# Patient Record
Sex: Male | Born: 1955
Health system: Southern US, Community
[De-identification: ages and names within clinical notes are randomized; demographics above are authoritative.]

## PROBLEM LIST (undated history)

## (undated) DIAGNOSIS — I10 Essential (primary) hypertension: Secondary | ICD-10-CM

## (undated) DIAGNOSIS — T8859XA Other complications of anesthesia, initial encounter: Secondary | ICD-10-CM

## (undated) DIAGNOSIS — I4510 Unspecified right bundle-branch block: Secondary | ICD-10-CM

## (undated) DIAGNOSIS — L719 Rosacea, unspecified: Secondary | ICD-10-CM

## (undated) DIAGNOSIS — T7840XA Allergy, unspecified, initial encounter: Secondary | ICD-10-CM

## (undated) DIAGNOSIS — E669 Obesity, unspecified: Secondary | ICD-10-CM

## (undated) DIAGNOSIS — G4730 Sleep apnea, unspecified: Secondary | ICD-10-CM

## (undated) DIAGNOSIS — R0602 Shortness of breath: Secondary | ICD-10-CM

## (undated) DIAGNOSIS — Z9289 Personal history of other medical treatment: Secondary | ICD-10-CM

## (undated) DIAGNOSIS — M1990 Unspecified osteoarthritis, unspecified site: Secondary | ICD-10-CM

## (undated) DIAGNOSIS — F17200 Nicotine dependence, unspecified, uncomplicated: Secondary | ICD-10-CM

## (undated) DIAGNOSIS — I480 Paroxysmal atrial fibrillation: Secondary | ICD-10-CM

## (undated) DIAGNOSIS — E785 Hyperlipidemia, unspecified: Secondary | ICD-10-CM

## (undated) DIAGNOSIS — Z86010 Personal history of colonic polyps: Secondary | ICD-10-CM

## (undated) DIAGNOSIS — K219 Gastro-esophageal reflux disease without esophagitis: Secondary | ICD-10-CM

## (undated) DIAGNOSIS — R011 Cardiac murmur, unspecified: Secondary | ICD-10-CM

## (undated) DIAGNOSIS — Z860101 Personal history of adenomatous and serrated colon polyps: Secondary | ICD-10-CM

## (undated) HISTORY — DX: Unspecified right bundle-branch block: I45.10

## (undated) HISTORY — PX: FINGER SURGERY: SHX640

## (undated) HISTORY — DX: Hyperlipidemia, unspecified: E78.5

## (undated) HISTORY — DX: Gastro-esophageal reflux disease without esophagitis: K21.9

## (undated) HISTORY — PX: TONSILLECTOMY: SUR1361

## (undated) HISTORY — PX: KNEE ARTHROSCOPY: SUR90

## (undated) HISTORY — PX: SHOULDER CLOSED REDUCTION: SHX1051

## (undated) HISTORY — DX: Rosacea, unspecified: L71.9

## (undated) HISTORY — DX: Personal history of other medical treatment: Z92.89

## (undated) HISTORY — DX: Paroxysmal atrial fibrillation: I48.0

## (undated) HISTORY — DX: Nicotine dependence, unspecified, uncomplicated: F17.200

## (undated) HISTORY — DX: Allergy, unspecified, initial encounter: T78.40XA

## (undated) HISTORY — DX: Cardiac murmur, unspecified: R01.1

---

## 1998-12-03 ENCOUNTER — Emergency Department (HOSPITAL_COMMUNITY): Admission: EM | Admit: 1998-12-03 | Discharge: 1998-12-04 | Payer: Self-pay | Admitting: Emergency Medicine

## 1998-12-04 ENCOUNTER — Encounter: Payer: Self-pay | Admitting: Emergency Medicine

## 2001-01-18 ENCOUNTER — Emergency Department (HOSPITAL_COMMUNITY): Admission: EM | Admit: 2001-01-18 | Discharge: 2001-01-18 | Payer: Self-pay | Admitting: Emergency Medicine

## 2001-01-18 ENCOUNTER — Encounter: Payer: Self-pay | Admitting: Emergency Medicine

## 2004-05-09 ENCOUNTER — Emergency Department (HOSPITAL_COMMUNITY): Admission: EM | Admit: 2004-05-09 | Discharge: 2004-05-09 | Payer: Self-pay | Admitting: Emergency Medicine

## 2006-07-29 ENCOUNTER — Ambulatory Visit: Payer: Self-pay | Admitting: Family Medicine

## 2006-08-07 ENCOUNTER — Encounter: Payer: Self-pay | Admitting: Cardiology

## 2006-08-07 ENCOUNTER — Ambulatory Visit: Payer: Self-pay

## 2006-10-10 ENCOUNTER — Ambulatory Visit: Payer: Self-pay | Admitting: Family Medicine

## 2006-11-22 ENCOUNTER — Ambulatory Visit: Payer: Self-pay | Admitting: Family Medicine

## 2006-11-25 ENCOUNTER — Encounter: Payer: Self-pay | Admitting: Vascular Surgery

## 2006-11-25 ENCOUNTER — Ambulatory Visit (HOSPITAL_COMMUNITY): Admission: RE | Admit: 2006-11-25 | Discharge: 2006-11-25 | Payer: Self-pay | Admitting: Family Medicine

## 2006-11-25 ENCOUNTER — Ambulatory Visit: Payer: Self-pay | Admitting: Vascular Surgery

## 2007-07-03 ENCOUNTER — Ambulatory Visit: Payer: Self-pay | Admitting: Family Medicine

## 2007-07-21 ENCOUNTER — Ambulatory Visit: Payer: Self-pay | Admitting: Family Medicine

## 2007-08-28 DIAGNOSIS — Z9289 Personal history of other medical treatment: Secondary | ICD-10-CM

## 2007-08-28 HISTORY — DX: Personal history of other medical treatment: Z92.89

## 2008-04-29 ENCOUNTER — Ambulatory Visit: Payer: Self-pay | Admitting: Family Medicine

## 2008-12-20 ENCOUNTER — Ambulatory Visit: Payer: Self-pay | Admitting: Family Medicine

## 2009-03-15 ENCOUNTER — Ambulatory Visit: Payer: Self-pay | Admitting: Family Medicine

## 2009-03-17 ENCOUNTER — Ambulatory Visit: Payer: Self-pay | Admitting: Family Medicine

## 2009-03-25 ENCOUNTER — Ambulatory Visit: Payer: Self-pay | Admitting: Family Medicine

## 2009-03-29 DIAGNOSIS — Z9289 Personal history of other medical treatment: Secondary | ICD-10-CM

## 2009-03-29 HISTORY — DX: Personal history of other medical treatment: Z92.89

## 2009-04-14 ENCOUNTER — Ambulatory Visit: Payer: Self-pay | Admitting: Family Medicine

## 2009-05-18 ENCOUNTER — Ambulatory Visit: Payer: Self-pay | Admitting: Family Medicine

## 2009-08-09 ENCOUNTER — Encounter: Admission: RE | Admit: 2009-08-09 | Discharge: 2009-08-09 | Payer: Self-pay | Admitting: Family Medicine

## 2009-08-09 ENCOUNTER — Ambulatory Visit: Payer: Self-pay | Admitting: Family Medicine

## 2010-04-18 ENCOUNTER — Ambulatory Visit: Payer: Self-pay | Admitting: Family Medicine

## 2010-09-11 ENCOUNTER — Encounter: Payer: Self-pay | Admitting: Orthopedic Surgery

## 2011-01-05 NOTE — Op Note (Signed)
David [name], CHAVIRA NO.:  [id number]   MEDICAL RECORD NO.:  [id number]          PATIENT TYPE:  EMS   LOCATION:  [facility]                         FACILITY:  [facility]   PHYSICIAN:  Cindee Salt, M.D.       DATE OF BIRTH:  [birth year]   DATE OF PROCEDURE:  05/09/2004  DATE OF DISCHARGE:                                 OPERATIVE REPORT   PREOPERATIVE DIAGNOSIS:  Laceration, extensor tendon, left index finger.   POSTOPERATIVE DIAGNOSIS:  Laceration, extensor tendon, left index finger.   OPERATION PERFORMED:  Repair of extensor tendon, left index finger.   SURGEON:  Cindee Salt, M.D.   ASSISTANTCarolyne Fiscal.   ANESTHESIA:  Local.   INDICATIONS FOR PROCEDURE:  The patient is a 55 year old male who suffered  an injury to his left index finger with a chain saw today.  He was seen in  [facility] Emergency Department.   DESCRIPTION OF PROCEDURE:  The patient was given a local anesthetic with 1%  Xylocaine without epinephrine.  He was prepped using DuraPrep solution.  A  tourniquet placed high on the arm was inflated to 250 mmHg.  The wound was  opened.  The laceration to the extensor tendon was identified.  The injury  did not go into the bone or into the joint.  Foreign material was removed.  The wound was then copiously irrigated with saline.  The tendon was then  repaired with figure-of-eight 4-0 Mersilene sutures.  The skin was repaired  with interrupted 5-0 nylon sutures after debridement of the damaged  margins.  A sterile compressive dressing and splint to the index and middle  finger were applied.  The patient tolerated the procedure well.  He is  discharged to home to return to the [facility] in one week on  Talwin NX and Keflex.       GK/MEDQ  D:  05/09/2004  T:  05/10/2004  Job:  [id number]

## 2011-03-05 ENCOUNTER — Other Ambulatory Visit: Payer: Self-pay

## 2011-03-05 MED ORDER — VALSARTAN 80 MG PO TABS: 80.0000 mg | ORAL_TABLET | Freq: Every day | ORAL | Status: DC

## 2011-03-14 ENCOUNTER — Encounter: Payer: Self-pay | Admitting: Family Medicine

## 2011-07-06 ENCOUNTER — Emergency Department (HOSPITAL_COMMUNITY): Payer: Worker's Compensation

## 2011-07-06 ENCOUNTER — Ambulatory Visit (HOSPITAL_COMMUNITY)
Admission: EM | Admit: 2011-07-06 | Discharge: 2011-07-06 | Disposition: A | Discharge status: Home / Self Care | Payer: Worker's Compensation | Attending: Emergency Medicine | Admitting: Emergency Medicine

## 2011-07-06 ENCOUNTER — Encounter (HOSPITAL_COMMUNITY): Payer: Self-pay | Admitting: Certified Registered Nurse Anesthetist

## 2011-07-06 ENCOUNTER — Encounter (HOSPITAL_COMMUNITY): Payer: Self-pay | Admitting: Anesthesiology

## 2011-07-06 ENCOUNTER — Emergency Department (HOSPITAL_COMMUNITY): Payer: Worker's Compensation | Admitting: Anesthesiology

## 2011-07-06 ENCOUNTER — Encounter (HOSPITAL_COMMUNITY): Payer: Self-pay

## 2011-07-06 ENCOUNTER — Other Ambulatory Visit: Payer: Self-pay

## 2011-07-06 ENCOUNTER — Encounter (HOSPITAL_COMMUNITY)
Admission: EM | Disposition: A | Discharge status: Home / Self Care | Payer: Self-pay | Source: Home / Self Care | Attending: Emergency Medicine

## 2011-07-06 DIAGNOSIS — Y990 Civilian activity done for income or pay: Secondary | ICD-10-CM | POA: Insufficient documentation

## 2011-07-06 DIAGNOSIS — Z23 Encounter for immunization: Secondary | ICD-10-CM | POA: Insufficient documentation

## 2011-07-06 DIAGNOSIS — S43006A Unspecified dislocation of unspecified shoulder joint, initial encounter: Secondary | ICD-10-CM

## 2011-07-06 DIAGNOSIS — W1789XA Other fall from one level to another, initial encounter: Secondary | ICD-10-CM | POA: Insufficient documentation

## 2011-07-06 DIAGNOSIS — Y929 Unspecified place or not applicable: Secondary | ICD-10-CM | POA: Insufficient documentation

## 2011-07-06 DIAGNOSIS — IMO0002 Reserved for concepts with insufficient information to code with codable children: Secondary | ICD-10-CM | POA: Insufficient documentation

## 2011-07-06 LAB — BASIC METABOLIC PANEL
BUN: 19 mg/dL (ref 6–23)
Calcium: 9.5 mg/dL (ref 8.4–10.5)
Creatinine, Ser: 0.85 mg/dL (ref 0.50–1.35)
GFR calc Af Amer: >90 mL/min (ref >90)
GFR calc non Af Amer: >90 mL/min (ref >90)
Glucose, Bld: 101 mg/dL — ABNORMAL HIGH (ref 70–99)
Potassium: 3.9 mEq/L (ref 3.5–5.1)

## 2011-07-06 LAB — DIFFERENTIAL
Basophils Absolute: 0.1 10*3/uL (ref 0.0–0.1)
Basophils Relative: 1 % (ref 0–1)
Eosinophils Absolute: 0.3 10*3/uL (ref 0.0–0.7)
Eosinophils Relative: 3 % (ref 0–5)
Lymphocytes Relative: 26 % (ref 12–46)
Lymphs Abs: 2.7 10*3/uL (ref 0.7–4.0)
Monocytes Absolute: 0.8 10*3/uL (ref 0.1–1.0)
Monocytes Relative: 8 % (ref 3–12)
Neutro Abs: 6.5 10*3/uL (ref 1.7–7.7)
Neutrophils Relative %: 63 % (ref 43–77)

## 2011-07-06 LAB — CBC
HCT: 45.5 % (ref 39.0–52.0)
Hemoglobin: 16.1 g/dL (ref 13.0–17.0)
MCH: 31.9 pg (ref 26.0–34.0)
MCHC: 35.4 g/dL (ref 30.0–36.0)
MCV: 90.1 fL (ref 78.0–100.0)
Platelets: 239 10*3/uL (ref 150–400)
RBC: 5.05 MIL/uL (ref 4.22–5.81)
RDW: 13.5 % (ref 11.5–15.5)
WBC: 10.2 10*3/uL (ref 4.0–10.5)

## 2011-07-06 SURGERY — CLOSED REDUCTION, SHOULDER
Anesthesia: General | Site: Shoulder | Laterality: Right | Wound class: Clean

## 2011-07-06 MED ORDER — HYDROMORPHONE HCL PF 2 MG/ML IJ SOLN
2.0000 mg | Freq: Once | INTRAMUSCULAR | Status: AC
  Administered 2011-07-06: 2 mg via INTRAVENOUS

## 2011-07-06 MED ORDER — SODIUM CHLORIDE 0.9 % IV BOLUS (SEPSIS)
1000.0000 mL | Freq: Once | INTRAVENOUS | Status: AC
  Administered 2011-07-06: 1000 mL via INTRAVENOUS

## 2011-07-06 MED ORDER — HYDROMORPHONE HCL PF 2 MG/ML IJ SOLN
2.0000 mg | Freq: Once | INTRAMUSCULAR | Status: AC
  Administered 2011-07-06: 2 mg via INTRAVENOUS
  Filled 2011-07-06: qty 1

## 2011-07-06 MED ORDER — ETOMIDATE 2 MG/ML IV SOLN
15.0000 mg | Freq: Once | INTRAVENOUS | Status: AC
  Administered 2011-07-06: 15 mg via INTRAVENOUS

## 2011-07-06 MED ORDER — MIDAZOLAM HCL 5 MG/5ML IJ SOLN
INTRAMUSCULAR | Status: DC | PRN
  Administered 2011-07-06: 1 mg via INTRAVENOUS

## 2011-07-06 MED ORDER — ONDANSETRON HCL 4 MG/2ML IJ SOLN
INTRAMUSCULAR | Status: DC | PRN
  Administered 2011-07-06: 4 mg via INTRAVENOUS

## 2011-07-06 MED ORDER — HYDROMORPHONE HCL PF 2 MG/ML IJ SOLN
INTRAMUSCULAR | Status: AC
  Filled 2011-07-06: qty 1

## 2011-07-06 MED ORDER — MEPERIDINE HCL 25 MG/ML IJ SOLN: 6.2500 mg | INTRAMUSCULAR | Status: DC | PRN

## 2011-07-06 MED ORDER — EPHEDRINE SULFATE 50 MG/ML IJ SOLN
INTRAMUSCULAR | Status: DC | PRN
  Administered 2011-07-06: 15 mg via INTRAVENOUS
  Administered 2011-07-06: 10 mg via INTRAVENOUS
  Administered 2011-07-06: 25 mg via INTRAVENOUS

## 2011-07-06 MED ORDER — PROPOFOL 10 MG/ML IV EMUL
INTRAVENOUS | Status: DC | PRN
  Administered 2011-07-06: 200 mg via INTRAVENOUS

## 2011-07-06 MED ORDER — FENTANYL CITRATE 0.05 MG/ML IJ SOLN: 100.0000 ug | Freq: Once | INTRAMUSCULAR | Status: DC

## 2011-07-06 MED ORDER — ETOMIDATE 2 MG/ML IV SOLN
INTRAVENOUS | Status: AC
  Filled 2011-07-06: qty 10

## 2011-07-06 MED ORDER — FENTANYL CITRATE 0.05 MG/ML IJ SOLN
25.0000 ug | INTRAMUSCULAR | Status: DC | PRN
  Administered 2011-07-06 (×2): 50 ug via INTRAVENOUS

## 2011-07-06 MED ORDER — SODIUM CHLORIDE 0.9 % IV SOLN
INTRAVENOUS | Status: DC | PRN
  Administered 2011-07-06: 20:00:00 via INTRAVENOUS

## 2011-07-06 MED ORDER — HYDROMORPHONE HCL PF 2 MG/ML IJ SOLN
2.0000 mg | Freq: Once | INTRAMUSCULAR | Status: AC
  Administered 2011-07-06: 1 mg via INTRAVENOUS

## 2011-07-06 MED ORDER — SUCCINYLCHOLINE CHLORIDE 20 MG/ML IJ SOLN
INTRAMUSCULAR | Status: DC | PRN
  Administered 2011-07-06: 200 mg via INTRAVENOUS

## 2011-07-06 MED ORDER — ONDANSETRON HCL 4 MG/2ML IJ SOLN
4.0000 mg | Freq: Once | INTRAMUSCULAR | Status: AC
  Administered 2011-07-06: 4 mg via INTRAVENOUS
  Filled 2011-07-06: qty 2

## 2011-07-06 MED ORDER — PHENYLEPHRINE HCL 10 MG/ML IJ SOLN
INTRAMUSCULAR | Status: DC | PRN
  Administered 2011-07-06: 80 ug via INTRAVENOUS
  Administered 2011-07-06: 40 ug via INTRAVENOUS
  Administered 2011-07-06: 80 ug via INTRAVENOUS

## 2011-07-06 MED ORDER — PROMETHAZINE HCL 25 MG/ML IJ SOLN
6.2500 mg | INTRAMUSCULAR | Status: DC | PRN
  Filled 2011-07-06: qty 1

## 2011-07-06 MED ORDER — HYDROMORPHONE HCL 2 MG PO TABS: 2.0000 mg | ORAL_TABLET | ORAL | Status: AC | PRN

## 2011-07-06 MED ORDER — FENTANYL CITRATE 0.05 MG/ML IJ SOLN
INTRAMUSCULAR | Status: DC | PRN
  Administered 2011-07-06: 100 ug via INTRAVENOUS

## 2011-07-06 MED ORDER — SODIUM CHLORIDE 0.9 % IR SOLN
Status: DC | PRN
  Administered 2011-07-06: 1

## 2011-07-06 MED ORDER — FENTANYL CITRATE 0.05 MG/ML IJ SOLN
INTRAMUSCULAR | Status: AC
  Administered 2011-07-06: 100 ug
  Filled 2011-07-06: qty 2

## 2011-07-06 MED ORDER — TETANUS-DIPHTHERIA TOXOIDS TD 5-2 LFU IM INJ
0.5000 mL | INJECTION | Freq: Once | INTRAMUSCULAR | Status: AC
  Administered 2011-07-06: 0.5 mL via INTRAMUSCULAR
  Filled 2011-07-06: qty 0.5

## 2011-07-06 SURGICAL SUPPLY — 3 items
BANDAGE GAUZE ELAST BULKY 4 IN (GAUZE/BANDAGES/DRESSINGS) ×2 IMPLANT
SLING ARM FOAM STRAP XLG (SOFTGOODS) ×2 IMPLANT
SPONGE GAUZE 4X4 12PLY (GAUZE/BANDAGES/DRESSINGS) ×6 IMPLANT

## 2011-07-06 NOTE — ED Notes (Signed)
Patient transported to CT 

## 2011-07-06 NOTE — Anesthesia Postprocedure Evaluation (Signed)
  Anesthesia Post-op Note  Patient: David [name]  Procedure(s) Performed:  CLOSED REDUCTION SHOULDER  Patient Location: PACU  Anesthesia Type: General  Level of Consciousness: awake and alert   Airway and Oxygen Therapy: Patient Spontanous Breathing and Patient connected to nasal cannula oxygen  Post-op Pain: mild  Post-op Assessment: Post-op Vital signs reviewed, Patient's Cardiovascular Status Stable, Respiratory Function Stable, Patent Airway and No signs of Nausea or vomiting  Post-op Vital Signs: stable  Complications: No apparent anesthesia complications

## 2011-07-06 NOTE — ED Notes (Signed)
Patient transported to X-ray 

## 2011-07-06 NOTE — Anesthesia Preprocedure Evaluation (Signed)
Anesthesia Evaluation  Patient identified by MRN, date of birth, ID band Patient awake    Reviewed: Allergy & Precautions, H&P , NPO status   Airway Mallampati: III TM Distance: >3 FB Neck ROM: Full    Dental  (+) Teeth Intact   Pulmonary sleep apnea and Continuous Positive Airway Pressure Ventilation , Current Smoker,  clear to auscultation        Cardiovascular hypertension, Pt. on home beta blockers Regular + Systolic murmurs    Neuro/Psych Negative Neurological ROS  Negative Psych ROS   GI/Hepatic Neg liver ROS, GERD-  Controlled,  Endo/Other  Morbid obesity  Renal/GU negative Renal ROS     Musculoskeletal negative musculoskeletal ROS (+)   Abdominal (+) obese,   Peds  Hematology negative hematology ROS (+)   Anesthesia Other Findings   Reproductive/Obstetrics negative OB ROS                           Anesthesia Physical Anesthesia Plan  ASA: III and Emergent  Anesthesia Plan: General   Post-op Pain Management:    Induction: Intravenous  Airway Management Planned: Oral ETT  Additional Equipment:   Intra-op Plan:   Post-operative Plan: Extubation in OR  Informed Consent:   Dental advisory given  Plan Discussed with: Anesthesiologist  Anesthesia Plan Comments:         Anesthesia Quick Evaluation

## 2011-07-06 NOTE — ED Notes (Signed)
Patient taken to the OR.  Spoke with Dr. Andrey Spearman and no orders recd just get him to the OR.  CT tech assisted with taking the patient to the OR.  Belongings given to [place], friend.

## 2011-07-06 NOTE — Op Note (Signed)
David [name], GARCON NO.:  [id number]  MEDICAL RECORD NO.:  [id number]  LOCATION:  [facility]                         FACILITY:  [facility]  PHYSICIAN:  Estill Bamberg, MD      DATE OF BIRTH:  Mar 05, 1956  DATE OF PROCEDURE: DATE OF DISCHARGE:                              OPERATIVE REPORT   PREOPERATIVE DIAGNOSIS:  Right superior shoulder dislocation.  POSTOPERATIVE DIAGNOSIS:  Right superior shoulder dislocation.  PROCEDURE: 1. Closed reduction of right shoulder under general endotracheal     anesthesia. 2. Intraoperative use of fluoroscopy.  SURGEON:  Estill Bamberg, MD  ASSISTANT:  Nadara Mustard, MD  ANESTHESIA:  General endotracheal anesthesia.  COMPLICATIONS:  None.  DISPOSITION:  Stable.  INDICATIONS FOR PROCEDURE:  Briefly, Mr. Nafziger is a pleasant 55 year old male who presented to the emergency department today after falling from approximately 9-10 foot height from a ladder.  The patient fell directly on his right elbow and did go on to have severe pain in the right shoulder.  Radiographs in the emergency department were consistent with a superior dislocation of the right shoulder.  I did attempt a closed reduction under conscious sedation in the emergency department but this was unsuccessful.  I subsequently obtained a CAT scan of the patient's right shoulder, which did confirm a significant superior dislocation of the shoulder.  Of note, the humeral head was perched superior to the coracoid process.  Given the persistent dislocation, I did have a discussion with the patient regarding going forward with a closed reduction under general endotracheal anesthesia.  The patient clearly understood that this was a rather significant injury and that the brachial plexus was under significant tension and that reduction of his injury can result in neurovascular injury.  OPERATIVE DETAILS:  On July 06, 2011, the patient was brought to surgery and general  endotracheal anesthesia was administered.  A C-arm was brought in.  Initial AP fluoroscopic view did confirm persistent superior dislocation of his shoulder.  I did proceed with manipulating the patient's right shoulder with Aldean Baker serving as my Geophysicist/field seismologist. I did bring the shoulder through various ranges of motion involving abduction as well as internal and external rotation.  No audible or palpable clunk was perceived, however I did feel that the right shoulder was subsequently reduced.  I then proceeded with obtaining multiple fluoroscopic views including AP and axillary views and this did confirm appropriate and concentric reduction of the glenohumeral joint.  The patient did have a palpable radial pulse after the closed reduction.     Estill Bamberg, MD     MD/MEDQ  D:  07/06/2011  T:  07/06/2011  Job:  [id number]  cc:   Nadara Mustard, MD

## 2011-07-06 NOTE — Transfer of Care (Signed)
Immediate Anesthesia Transfer of Care Note  Patient: David [name]  Procedure(s) Performed:  CLOSED REDUCTION SHOULDER  Patient Location: PACU  Anesthesia Type: General  Level of Consciousness: awake, alert , oriented and patient cooperative  Airway & Oxygen Therapy: Patient Spontanous Breathing and Patient connected to nasal cannula oxygen  Post-op Assessment: Report given to PACU RN, Post -op Vital signs reviewed and stable and Patient moving all extremities X 4  Post vital signs: Reviewed and stable  Complications: No apparent anesthesia complications

## 2011-07-06 NOTE — ED Notes (Signed)
Pt arrived on back board and in head blocks with towel for c-collar as c-collar would not fit

## 2011-07-06 NOTE — ED Provider Notes (Addendum)
History     CSN: [id number] Arrival date & time: 07/06/2011  2:58 PM   First MD Initiated Contact with Patient 07/06/11 1507      Chief Complaint  Patient presents with  . Fall    (Consider location/radiation/quality/duration/timing/severity/associated sxs/prior treatment) HPI.... level V caveat for urgent need for intervention. Patient fell approximately 15 feet from a tree. No loss of consciousness, head trauma, neck trauma abdominal pain, back pain. Complains of right shoulder pain and abrasion to right elbow.  Movement makes pain worse  Past Medical History  Diagnosis Date  . Allergy     RHINITIS  . GERD (gastroesophageal reflux disease)   . PAF (paroxysmal atrial fibrillation)   . Hyperlipidemia   . Rosacea   . Smoker     No past surgical history on file.  Family History  Problem Relation Age of Onset  . Cancer Father     COLON  . Cancer Paternal Uncle     History  Substance Use Topics  . Smoking status: Not on file  . Smokeless tobacco: Not on file  . Alcohol Use:       Review of Systems  Unable to perform ROS: Other    Allergies  Codeine and Morphine and related  Home Medications   Current Outpatient Rx  Name Route Sig Dispense Refill  . ASPIRIN 81 MG PO TABS Oral Take 81 mg by mouth daily.      Marland Kitchen CETIRIZINE HCL 10 MG PO TABS Oral Take 10 mg by mouth daily.      Marland Kitchen METOPROLOL SUCCINATE 50 MG PO TB24 Oral Take 50 mg by mouth daily.      Marland Kitchen VALSARTAN 80 MG PO TABS Oral Take 160 mg by mouth daily.        BP 119/44  Pulse 78  Temp(Src) 98.2 F (36.8 C) (Oral)  Resp 13  Ht 6\' 4"  (1.93 m)  Wt 340 lb (154.223 kg)  BMI 41.39 kg/m2  SpO2 99%  Physical Exam  Nursing note and vitals reviewed. Constitutional: He is oriented to person, place, and time. He appears well-developed and well-nourished.       Obese  HENT:  Head: Normocephalic and atraumatic.  Eyes: Conjunctivae and EOM are normal. Pupils are equal, round, and reactive to light.  Neck:  Normal range of motion. Neck supple.       No neck tenderness  Cardiovascular: Normal rate and regular rhythm.   Pulmonary/Chest: Effort normal and breath sounds normal.  Abdominal: Soft. Bowel sounds are normal.  Musculoskeletal:       Generalized tenderness around right shoulder. Appears dislocated. Pain with range of motion. Abrasion on right elbow tip  Neurological: He is alert and oriented to person, place, and time.       Normal motor sensory and cerebellar exam  Skin: Skin is warm and dry.  Psychiatric: He has a normal mood and affect.    ED Course  Procedural sedation Date/Time: 07/06/2011 9:18 PM Performed by: Donnetta Hutching Authorized by: Donnetta Hutching Consent: Verbal consent obtained. Written consent obtained. Risks and benefits: risks, benefits and alternatives were discussed Consent given by: patient Patient understanding: patient states understanding of the procedure being performed Patient consent: the patient's understanding of the procedure matches consent given Procedure consent: procedure consent matches procedure scheduled Relevant documents: relevant documents present and verified Test results: test results available and properly labeled Site marked: the operative site was marked Imaging studies: imaging studies available Required items: required blood products, implants, devices,  and special equipment available Patient identity confirmed: verbally with patient Time out: Immediately prior to procedure a "time out" was called to verify the correct patient, procedure, equipment, support staff and site/side marked as required. Local anesthesia used: no Patient sedated: yes Sedatives: etomidate and fentanyl Analgesia: hydromorphone Vitals: Vital signs were monitored during sedation. Patient tolerance: Patient tolerated the procedure well with no immediate complications (Tolerated well). Comments: Total conscious sedation time of approximately 30 minutes   (including  critical care time)  Labs Reviewed  BASIC METABOLIC PANEL - Abnormal; Notable for the following:    Sodium 134 (*)    Glucose, Bld 101 (*)    All other components within normal limits  CBC  DIFFERENTIAL   No results found.   No diagnosis found.  Date: 07/06/2011  Rate: 74  Rhythm: normal sinus rhythm  QRS Axis: normal  Intervals: normal  ST/T Wave abnormalities: normal  Conduction Disutrbances:right bundle branch block  Narrative Interpretation:   Old EKG Reviewed: none available    MDM  Mostly concerned about right shoulder. will scan head and neck secondary to trauma. X-rays right shoulder shows superior subluxation. We'll consult orthopedics. Conscious sedation perform        Donnetta Hutching, MD 07/06/11 1651  Donnetta Hutching, MD 07/06/11 2120

## 2011-07-06 NOTE — ED Notes (Signed)
Pt was at work cutting branches and fell approx 15 feet to pavement.  ems report no loc. ems gave fentanyl

## 2011-07-06 NOTE — ED Notes (Signed)
Report given to Barbara RN

## 2011-07-06 NOTE — Anesthesia Procedure Notes (Addendum)
Procedure Name: Intubation Date/Time: 07/06/2011 8:48 PM Performed by: Shirlyn Goltz, TOM Pre-anesthesia Checklist: Patient identified, Emergency Drugs available, Suction available, Patient being monitored and Timeout performed Patient Re-evaluated:Patient Re-evaluated prior to inductionOxygen Delivery Method: Circle System Utilized Preoxygenation: Pre-oxygenation with 100% oxygen Intubation Type: IV induction Laryngoscope Size: Mac and 4 Grade View: Grade II Tube size: 7.5 mm Number of attempts: 1 Placement Confirmation: ETT inserted through vocal cords under direct vision,  positive ETCO2 and breath sounds checked- equal and bilateral Secured at: 23 cm Tube secured with: Tape Dental Injury: Teeth and Oropharynx as per pre-operative assessment

## 2011-07-06 NOTE — Op Note (Signed)
NAMEMARKISE, [name] NO.:  [id number]  MEDICAL RECORD NO.:  [id number]  LOCATION:  [facility]                        FACILITY:  [facility]  PHYSICIAN:  Estill Bamberg, MD      DATE OF BIRTH:  05-24-1956  DATE OF PROCEDURE:  07/06/2011 DATE OF DISCHARGE:                              OPERATIVE REPORT   SURGEON:  Estill Bamberg, MD  PRE-PROCEDURE DIAGNOSIS:  Right shoulder dislocation.  HISTORY OF PRESENT ILLNESS:  [name] is a pleasant male evaluated in the emergency department.  Radiographs were consistent with a shoulder dislocation, and the patient was given conscious sedation to go forward with closed reduction of the right shoulder.  PROCEDURE NOTE:  Conscious sedation was administered via the emergency room physician, Dr. Donnetta Hutching.  Under conscious sedation, I did use a traction/counter-traction method, and I did attempt to relocate the shoulder.  I did attempt this procedure for approximately 5 minutes in duration.  No audible or palpable clunk was heard, and I did not feel any significant reduction maneuver was ultimately performed.  PLAN:  Currently is to go forward with a CAT scan of the patient's right shoulder to precisely determine the location of the glenohumeral joint.     Estill Bamberg, MD     MD/MEDQ  D:  07/06/2011  T:  07/06/2011  Job:  [id number]

## 2011-07-06 NOTE — Preoperative (Signed)
Beta Blockers   Reason not to administer Beta Blockers:Not Applicable 

## 2011-07-06 NOTE — ED Notes (Signed)
Called to radiology department. Pt c/o of extreme pain, unable to complete tests. Dr. Adriana Simas aware. Medicated again per order in radiology department.

## 2011-07-07 NOTE — Consult Note (Signed)
NAMEJERIC, David [name] NO.:  [id number]  MEDICAL RECORD NO.:  [id number]  LOCATION:  [facility]                        FACILITY:  [facility]  PHYSICIAN:  Estill Bamberg, MD      DATE OF BIRTH:  April 11, 1956  DATE OF CONSULTATION: DATE OF DISCHARGE:                                CONSULTATION   REASON FOR CONSULT:  Right shoulder pain.  HISTORY OF PRESENT ILLNESS:  David [name] is a pleasant 55 year old male who states earlier this afternoon, he was up on a ladder and trimming a tree.  He states he fell directly on his right arm.  To his best knowledge, he feels that he fell directly on his left elbow.  It is the patient's approximation that his feet were approximately 9-10 feet from the ground.  When he awoke, he complained of severe pain in the elbow and in the shoulder.  Radiographs were consistent with a right shoulder dislocation and I was called to evaluate the patient.  The patient denies any significant pain in his other extremities other than the significant pain in the right shoulder.  The pain is better than it was when initially began.  The patient does rate the pain as being rather severe.  PAST MEDICAL HISTORY: 1. Rhinitis. 2. Gastroesophageal reflux disease. 3. Paroxysmal atrial fibrillation. 4. Hyperlipidemia. 5. Rosacea.  PAST SURGICAL HISTORY:  None.  MEDICATIONS:  Baby aspirin, cetirizine, metoprolol, and valsartan.  ALLERGIES:  CODEINE and MORPHINE.  SOCIAL HISTORY:  The patient is a smoker.  PHYSICAL EXAMINATION:  The patient is lying comfortably supine on the hospital bed.  He does have superficial abrasions about the right elbow. There is a prominence overlying the anterosuperior aspect of the right shoulder.  Any range of motion of the right shoulder does cause him very significant pain.  The patient is noted to be neurovascularly intact distally with full grip strength noted.  He has full sensation throughout all 5 fingers.  He has a positive  radial pulse on both the right and the left sides.  Pulses are symmetric.  Of note, the patient is a rather overweight and rather a large-appearing individual.  He does have a normal affect and normal mood. His abdomen is soft and nontender. He does appear to be breathing comfortably.  A scapular Y an AP view of the patient's right shoulder were reviewed. There does appear to be anterior/superior dislocation of the right shoulder.  I do believe that a superior dislocation is certainly a possibility here, but these are not perfect fuse and given the rarity of superior dislocations, I tend to favor that this is actually an anterior dislocation of the shoulder.  ASSESSMENT:  Anterior/possible superior dislocation of the right glenohumeral joint.  PLAN:  I did have a lengthy discussion with David [name] regarding these diagnoses.  He currently is noted to have a dislocation involving the right shoulder, which I do suspect as likely be an anterior shoulder dislocation.  I did have a discussion regarding treatment.  The plan currently is to go forward with giving the patient a full conscious sedation and to go forward with the reduction maneuver.  I did fully  explain to the patient that there is a risk of neurovascular injury as well as a fracture given the rarity of what may be a superior dislocation and given the patient's large size and body habitus.  The patient does understand that if this is not reduced and closed here in the emergency department, they will may have do this under general anesthesia in the operating room, but the first attempt will be to handle this here in the emergency department.  The plan currently is to go forward with a closed glenohumeral reduction.  Again, the risks and benefits were fully described to the patient and we will go ahead with the plan as noted above.     Estill Bamberg, MD     MD/MEDQ  D:  07/06/2011  T:  07/07/2011  Job:  [id number]

## 2011-07-11 ENCOUNTER — Encounter (HOSPITAL_COMMUNITY): Payer: Self-pay | Admitting: Orthopedic Surgery

## 2011-08-08 ENCOUNTER — Encounter (HOSPITAL_COMMUNITY): Payer: Self-pay | Admitting: Pharmacy Technician

## 2011-08-09 ENCOUNTER — Other Ambulatory Visit: Payer: Self-pay | Admitting: Orthopedic Surgery

## 2011-08-09 ENCOUNTER — Encounter (HOSPITAL_COMMUNITY): Payer: Self-pay

## 2011-08-09 ENCOUNTER — Encounter (HOSPITAL_COMMUNITY)
Admission: RE | Admit: 2011-08-09 | Discharge: 2011-08-09 | Disposition: A | Discharge status: Home / Self Care | Payer: Worker's Compensation | Source: Ambulatory Visit | Attending: Orthopedic Surgery | Admitting: Orthopedic Surgery

## 2011-08-09 ENCOUNTER — Other Ambulatory Visit: Payer: Self-pay

## 2011-08-09 HISTORY — DX: Essential (primary) hypertension: I10

## 2011-08-09 HISTORY — DX: Unspecified osteoarthritis, unspecified site: M19.90

## 2011-08-09 HISTORY — DX: Shortness of breath: R06.02

## 2011-08-09 HISTORY — DX: Sleep apnea, unspecified: G47.30

## 2011-08-09 LAB — CBC
HCT: 44.4 % (ref 39.0–52.0)
Hemoglobin: 15.6 g/dL (ref 13.0–17.0)
MCHC: 35.1 g/dL (ref 30.0–36.0)

## 2011-08-09 LAB — COMPREHENSIVE METABOLIC PANEL
Alkaline Phosphatase: 87 U/L (ref 39–117)
BUN: 19 mg/dL (ref 6–23)
GFR calc Af Amer: >90 mL/min (ref >90)
GFR calc non Af Amer: >90 mL/min (ref >90)
Glucose, Bld: 93 mg/dL (ref 70–99)
Potassium: 4 mEq/L (ref 3.5–5.1)
Total Bilirubin: 0.4 mg/dL (ref 0.3–1.2)
Total Protein: 7.3 g/dL (ref 6.0–8.3)

## 2011-08-09 LAB — SURGICAL PCR SCREEN: Staphylococcus aureus: NEGATIVE

## 2011-08-09 NOTE — Pre-Procedure Instructions (Signed)
21 David [name]  08/09/2011   Your procedure is scheduled on:  Saturday, December 22nd.  Report to [facility] to be registered.  Call this number if you have problems the morning of surgery: [phone]   Remember:   Do not eat food:After Midnight.  May have clear liquids: up to 4 Hours before arrival.  2:00AM  Clear liquids include soda, tea, black coffee, apple or grape juice, broth.  Take these medicines the morning of surgery with A SIP OF WATER: Metoprolol (Toprol-XL), Dilaudid and Zyrtec  Do not wear jewelry, make-up or nail polish.  Do not wear lotions, powders, or perfumes. You may wear deodorant.  Do not shave 48 hours prior to surgery.  Do not bring valuables to the hospital.  Contacts, dentures or bridgework may not be worn into surgery.  Leave suitcase in the car. After surgery it may be brought to your room.  For patients admitted to the hospital, checkout time is 11:00 AM the day of discharge.   Patients discharged the day of surgery will not be allowed to drive home.  Name and phone number of your driver: NA  Special Instructions: CHG Shower Use Special Wash: 1/2 bottle night before surgery and 1/2 bottle morning of surgery.   Please read over the following fact sheets that you were given: Pain Booklet, Coughing and Deep Breathing, MRSA Information and Surgical Site Infection Prevention

## 2011-08-10 MED ORDER — CEFAZOLIN SODIUM-DEXTROSE 2-3 GM-% IV SOLR
2.0000 g | INTRAVENOUS | Status: AC
  Administered 2011-08-11: 2 g via INTRAVENOUS
  Filled 2011-08-10: qty 50

## 2011-08-10 NOTE — Anesthesia Preprocedure Evaluation (Addendum)
Anesthesia Evaluation  Patient identified by MRN, date of birth, ID band Patient awake    Reviewed: Allergy & Precautions, H&P , NPO status , Patient's Chart, lab work & pertinent test results, reviewed documented beta blocker date and time   Airway Mallampati: III TM Distance: >3 FB Neck ROM: Full    Dental   Pulmonary  clear to auscultation        Cardiovascular hypertension, Regular Normal    Neuro/Psych    GI/Hepatic GERD-  ,  Endo/Other    Renal/GU      Musculoskeletal   Abdominal   Peds  Hematology   Anesthesia Other Findings   Reproductive/Obstetrics                         Anesthesia Physical Anesthesia Plan  ASA: III  Anesthesia Plan: General   Post-op Pain Management:    Induction: Intravenous  Airway Management Planned: Oral ETT and Video Laryngoscope Planned  Additional Equipment:   Intra-op Plan:   Post-operative Plan: Extubation in OR  Informed Consent: I have reviewed the patients History and Physical, chart, labs and discussed the procedure including the risks, benefits and alternatives for the proposed anesthesia with the patient or authorized representative who has indicated his/her understanding and acceptance.   Dental advisory given  Plan Discussed with: CRNA, Anesthesiologist and Surgeon  Anesthesia Plan Comments:         Anesthesia Quick Evaluation

## 2011-08-11 ENCOUNTER — Encounter (HOSPITAL_COMMUNITY): Payer: Self-pay | Admitting: Anesthesiology

## 2011-08-11 ENCOUNTER — Emergency Department (HOSPITAL_COMMUNITY)
Admission: EM | Admit: 2011-08-11 | Discharge: 2011-08-11 | Disposition: A | Discharge status: Home / Self Care | Payer: Worker's Compensation

## 2011-08-11 ENCOUNTER — Encounter (HOSPITAL_COMMUNITY): Payer: Self-pay | Admitting: *Deleted

## 2011-08-11 ENCOUNTER — Inpatient Hospital Stay (HOSPITAL_COMMUNITY)
Admission: RE | Admit: 2011-08-11 | Discharge: 2011-08-14 | DRG: 502 | Disposition: A | Discharge status: Home / Self Care | Payer: Worker's Compensation | Source: Ambulatory Visit | Attending: Orthopedic Surgery | Admitting: Orthopedic Surgery

## 2011-08-11 ENCOUNTER — Ambulatory Visit (HOSPITAL_COMMUNITY): Payer: Worker's Compensation | Admitting: Anesthesiology

## 2011-08-11 ENCOUNTER — Encounter (HOSPITAL_COMMUNITY)
Admission: RE | Disposition: A | Discharge status: Home / Self Care | Payer: Self-pay | Source: Ambulatory Visit | Attending: Orthopedic Surgery

## 2011-08-11 DIAGNOSIS — W19XXXA Unspecified fall, initial encounter: Secondary | ICD-10-CM | POA: Diagnosis present

## 2011-08-11 DIAGNOSIS — F17200 Nicotine dependence, unspecified, uncomplicated: Secondary | ICD-10-CM | POA: Diagnosis present

## 2011-08-11 DIAGNOSIS — M129 Arthropathy, unspecified: Secondary | ICD-10-CM | POA: Diagnosis present

## 2011-08-11 DIAGNOSIS — E785 Hyperlipidemia, unspecified: Secondary | ICD-10-CM | POA: Diagnosis present

## 2011-08-11 DIAGNOSIS — Y9269 Other specified industrial and construction area as the place of occurrence of the external cause: Secondary | ICD-10-CM

## 2011-08-11 DIAGNOSIS — Y990 Civilian activity done for income or pay: Secondary | ICD-10-CM

## 2011-08-11 DIAGNOSIS — I10 Essential (primary) hypertension: Secondary | ICD-10-CM | POA: Diagnosis present

## 2011-08-11 DIAGNOSIS — L719 Rosacea, unspecified: Secondary | ICD-10-CM | POA: Diagnosis present

## 2011-08-11 DIAGNOSIS — S43429A Sprain of unspecified rotator cuff capsule, initial encounter: Principal | ICD-10-CM | POA: Diagnosis present

## 2011-08-11 DIAGNOSIS — I4891 Unspecified atrial fibrillation: Secondary | ICD-10-CM | POA: Diagnosis present

## 2011-08-11 DIAGNOSIS — K219 Gastro-esophageal reflux disease without esophagitis: Secondary | ICD-10-CM | POA: Diagnosis present

## 2011-08-11 HISTORY — PX: SHOULDER OPEN ROTATOR CUFF REPAIR: SHX2407

## 2011-08-11 SURGERY — REPAIR, ROTATOR CUFF, OPEN
Anesthesia: General | Site: Shoulder | Laterality: Right | Wound class: Clean

## 2011-08-11 MED ORDER — DIPHENHYDRAMINE HCL 25 MG PO CAPS: 25.0000 mg | ORAL_CAPSULE | Freq: Four times a day (QID) | ORAL | Status: DC | PRN

## 2011-08-11 MED ORDER — BUPIVACAINE-EPINEPHRINE PF 0.5-1:200000 % IJ SOLN
INTRAMUSCULAR | Status: DC | PRN
  Administered 2011-08-11: 30 mL

## 2011-08-11 MED ORDER — ONDANSETRON HCL 4 MG/2ML IJ SOLN: 4.0000 mg | Freq: Once | INTRAMUSCULAR | Status: DC | PRN

## 2011-08-11 MED ORDER — ONDANSETRON HCL 4 MG PO TABS: 4.0000 mg | ORAL_TABLET | Freq: Four times a day (QID) | ORAL | Status: DC | PRN

## 2011-08-11 MED ORDER — ADULT MULTIVITAMIN W/MINERALS CH
1.0000 | ORAL_TABLET | Freq: Every day | ORAL | Status: DC
  Administered 2011-08-11 – 2011-08-14 (×4): 1 via ORAL
  Filled 2011-08-11 (×4): qty 1

## 2011-08-11 MED ORDER — VITAMIN C 500 MG PO TABS
1000.0000 mg | ORAL_TABLET | Freq: Every day | ORAL | Status: DC
  Administered 2011-08-11 – 2011-08-14 (×4): 1000 mg via ORAL
  Filled 2011-08-11 (×4): qty 2

## 2011-08-11 MED ORDER — PHENYLEPHRINE HCL 10 MG/ML IJ SOLN
10.0000 mg | INTRAVENOUS | Status: DC | PRN
  Administered 2011-08-11: 10 ug/min via INTRAVENOUS

## 2011-08-11 MED ORDER — HYDROMORPHONE HCL PF 1 MG/ML IJ SOLN
0.2500 mg | INTRAMUSCULAR | Status: DC | PRN
  Administered 2011-08-11 (×4): 0.5 mg via INTRAVENOUS

## 2011-08-11 MED ORDER — LACTATED RINGERS IV SOLN
INTRAVENOUS | Status: DC
  Administered 2011-08-11 – 2011-08-12 (×2): via INTRAVENOUS

## 2011-08-11 MED ORDER — NEOSTIGMINE METHYLSULFATE 1 MG/ML IJ SOLN
INTRAMUSCULAR | Status: DC | PRN
  Administered 2011-08-11: 5 mg via INTRAVENOUS

## 2011-08-11 MED ORDER — SENNA 8.6 MG PO TABS
1.0000 | ORAL_TABLET | Freq: Two times a day (BID) | ORAL | Status: DC
  Administered 2011-08-11 – 2011-08-14 (×6): 8.6 mg via ORAL
  Filled 2011-08-11 (×7): qty 1

## 2011-08-11 MED ORDER — HYDROMORPHONE HCL PF 1 MG/ML IJ SOLN
0.5000 mg | INTRAMUSCULAR | Status: DC | PRN
Start: 2011-08-11 — End: 2011-08-11
  Administered 2011-08-11 (×2): 0.5 mg via INTRAVENOUS

## 2011-08-11 MED ORDER — ROCURONIUM BROMIDE 100 MG/10ML IV SOLN
INTRAVENOUS | Status: DC | PRN
  Administered 2011-08-11: 50 mg via INTRAVENOUS

## 2011-08-11 MED ORDER — CEFAZOLIN SODIUM 1-5 GM-% IV SOLN
1.0000 g | Freq: Three times a day (TID) | INTRAVENOUS | Status: DC
  Administered 2011-08-11 – 2011-08-13 (×5): 1 g via INTRAVENOUS
  Filled 2011-08-11 (×7): qty 50

## 2011-08-11 MED ORDER — METHOCARBAMOL 100 MG/ML IJ SOLN
500.0000 mg | INTRAVENOUS | Status: AC
  Administered 2011-08-11: 500 mg via INTRAVENOUS
  Filled 2011-08-11: qty 5

## 2011-08-11 MED ORDER — LACTATED RINGERS IV SOLN
INTRAVENOUS | Status: DC | PRN
  Administered 2011-08-11 (×2): via INTRAVENOUS

## 2011-08-11 MED ORDER — FENTANYL CITRATE 0.05 MG/ML IJ SOLN
INTRAMUSCULAR | Status: DC | PRN
  Administered 2011-08-11: 50 ug via INTRAVENOUS
  Administered 2011-08-11 (×2): 100 ug via INTRAVENOUS
  Administered 2011-08-11: 50 ug via INTRAVENOUS
  Administered 2011-08-11: 5 ug via INTRAVENOUS
  Administered 2011-08-11: 50 ug via INTRAVENOUS

## 2011-08-11 MED ORDER — HYDROMORPHONE HCL PF 1 MG/ML IJ SOLN
0.5000 mg | INTRAMUSCULAR | Status: DC | PRN
  Administered 2011-08-11 – 2011-08-13 (×13): 1 mg via INTRAVENOUS
  Filled 2011-08-11 (×13): qty 1

## 2011-08-11 MED ORDER — MIDAZOLAM HCL 5 MG/5ML IJ SOLN
INTRAMUSCULAR | Status: DC | PRN
  Administered 2011-08-11: 1 mg via INTRAVENOUS

## 2011-08-11 MED ORDER — ASPIRIN EC 81 MG PO TBEC
81.0000 mg | DELAYED_RELEASE_TABLET | Freq: Every day | ORAL | Status: DC
  Administered 2011-08-12 – 2011-08-14 (×3): 81 mg via ORAL
  Filled 2011-08-11 (×4): qty 1

## 2011-08-11 MED ORDER — HYDROMORPHONE HCL PF 1 MG/ML IJ SOLN
INTRAMUSCULAR | Status: AC
  Administered 2011-08-11: 0.5 mg via INTRAVENOUS
  Filled 2011-08-11: qty 1

## 2011-08-11 MED ORDER — LORATADINE 10 MG PO TABS
10.0000 mg | ORAL_TABLET | Freq: Every day | ORAL | Status: DC
  Administered 2011-08-11 – 2011-08-14 (×3): 10 mg via ORAL
  Filled 2011-08-11 (×5): qty 1

## 2011-08-11 MED ORDER — CEFAZOLIN SODIUM 1-5 GM-% IV SOLN
INTRAVENOUS | Status: AC
  Filled 2011-08-11: qty 100

## 2011-08-11 MED ORDER — METOPROLOL SUCCINATE ER 50 MG PO TB24
50.0000 mg | ORAL_TABLET | Freq: Every day | ORAL | Status: DC
  Administered 2011-08-12 – 2011-08-14 (×3): 50 mg via ORAL
  Filled 2011-08-11 (×5): qty 1

## 2011-08-11 MED ORDER — GLYCOPYRROLATE 0.2 MG/ML IJ SOLN
INTRAMUSCULAR | Status: DC | PRN
  Administered 2011-08-11: .7 mg via INTRAVENOUS

## 2011-08-11 MED ORDER — 0.9 % SODIUM CHLORIDE (POUR BTL) OPTIME
TOPICAL | Status: DC | PRN
  Administered 2011-08-11: 3000 mL

## 2011-08-11 MED ORDER — FAMOTIDINE 20 MG PO TABS
20.0000 mg | ORAL_TABLET | Freq: Two times a day (BID) | ORAL | Status: DC | PRN
  Administered 2011-08-13: 20 mg via ORAL
  Filled 2011-08-11 (×2): qty 1

## 2011-08-11 MED ORDER — SUCCINYLCHOLINE CHLORIDE 20 MG/ML IJ SOLN
INTRAMUSCULAR | Status: DC | PRN
  Administered 2011-08-11: 200 mg via INTRAVENOUS

## 2011-08-11 MED ORDER — OLMESARTAN MEDOXOMIL 20 MG PO TABS
20.0000 mg | ORAL_TABLET | Freq: Every day | ORAL | Status: DC
  Administered 2011-08-11 – 2011-08-14 (×4): 20 mg via ORAL
  Filled 2011-08-11 (×4): qty 1

## 2011-08-11 MED ORDER — ONDANSETRON HCL 4 MG/2ML IJ SOLN
4.0000 mg | Freq: Four times a day (QID) | INTRAMUSCULAR | Status: DC | PRN
  Administered 2011-08-12: 4 mg via INTRAVENOUS
  Filled 2011-08-11: qty 2

## 2011-08-11 MED ORDER — MEPERIDINE HCL 25 MG/ML IJ SOLN: 6.2500 mg | INTRAMUSCULAR | Status: DC | PRN

## 2011-08-11 MED ORDER — POVIDONE-IODINE 7.5 % EX SOLN
CUTANEOUS | Status: DC
  Filled 2011-08-11: qty 118

## 2011-08-11 MED ORDER — ALPRAZOLAM 0.5 MG PO TABS
0.5000 mg | ORAL_TABLET | Freq: Four times a day (QID) | ORAL | Status: DC | PRN
  Administered 2011-08-11 – 2011-08-13 (×8): 0.5 mg via ORAL
  Filled 2011-08-11 (×8): qty 1

## 2011-08-11 MED ORDER — ONDANSETRON HCL 4 MG/2ML IJ SOLN
INTRAMUSCULAR | Status: DC | PRN
  Administered 2011-08-11: 4 mg via INTRAVENOUS

## 2011-08-11 MED ORDER — HYDROMORPHONE HCL 4 MG PO TABS
4.0000 mg | ORAL_TABLET | ORAL | Status: DC | PRN
  Administered 2011-08-11 – 2011-08-14 (×12): 4 mg via ORAL
  Filled 2011-08-11 (×13): qty 2

## 2011-08-11 MED ORDER — PROPOFOL 10 MG/ML IV EMUL
INTRAVENOUS | Status: DC | PRN
  Administered 2011-08-11: 380 mg via INTRAVENOUS

## 2011-08-11 MED ORDER — MORPHINE SULFATE 2 MG/ML IJ SOLN: 8.0000 mg | INTRAMUSCULAR | Status: DC | PRN

## 2011-08-11 MED ORDER — METHOCARBAMOL 500 MG PO TABS
500.0000 mg | ORAL_TABLET | Freq: Four times a day (QID) | ORAL | Status: DC | PRN
  Administered 2011-08-11 – 2011-08-14 (×9): 500 mg via ORAL
  Filled 2011-08-11 (×9): qty 1

## 2011-08-11 MED ORDER — METHOCARBAMOL 100 MG/ML IJ SOLN
500.0000 mg | Freq: Three times a day (TID) | INTRAMUSCULAR | Status: DC | PRN
  Filled 2011-08-11: qty 5

## 2011-08-11 MED ORDER — OXYCODONE HCL 5 MG PO TABS
5.0000 mg | ORAL_TABLET | ORAL | Status: DC | PRN
  Administered 2011-08-12 – 2011-08-13 (×7): 10 mg via ORAL
  Administered 2011-08-13: 5 mg via ORAL
  Administered 2011-08-14: 10 mg via ORAL
  Filled 2011-08-11 (×9): qty 2

## 2011-08-11 SURGICAL SUPPLY — 80 items
2.9MM JUGGERKNOT ×3 IMPLANT
ANCHOR JUGGERKNOT 2.9 (Anchor) ×3 IMPLANT
BENZOIN TINCTURE PRP APPL 2/3 (GAUZE/BANDAGES/DRESSINGS) IMPLANT
BLADE 4.2CUDA (BLADE) IMPLANT
BLADE CUDA 5.5 (BLADE) IMPLANT
BLADE CUTTER GATOR 3.5 (BLADE) IMPLANT
BLADE GREAT WHITE 4.2 (BLADE) IMPLANT
BLADE SURG 15 STRL LF DISP TIS (BLADE) ×2 IMPLANT
BLADE SURG 15 STRL SS (BLADE) ×1
BLADE SURG ROTATE 9660 (MISCELLANEOUS) IMPLANT
BUR 3.5 LG SPHERICAL (BURR) ×2 IMPLANT
BUR OVAL 4.0 (BURR) ×3 IMPLANT
BUR OVAL 6.0 (BURR) IMPLANT
BURR 3.5 LG SPHERICAL (BURR) ×3
CANNULA 5.75X71 LONG (CANNULA) ×3 IMPLANT
CANNULA TWIST IN 8.25X7CM (CANNULA) IMPLANT
CHLORAPREP W/TINT 26ML (MISCELLANEOUS) IMPLANT
CLOTH BEACON ORANGE TIMEOUT ST (SAFETY) ×3 IMPLANT
DECANTER SPIKE VIAL GLASS SM (MISCELLANEOUS) IMPLANT
DERMABOND ADVANCED (GAUZE/BANDAGES/DRESSINGS)
DERMABOND ADVANCED .7 DNX12 (GAUZE/BANDAGES/DRESSINGS) IMPLANT
DRAPE INCISE IOBAN 66X45 STRL (DRAPES) ×3 IMPLANT
DRAPE STERI 35X30 U-POUCH (DRAPES) ×6 IMPLANT
DRAPE SURG 17X23 STRL (DRAPES) ×3 IMPLANT
DRAPE U-SHAPE 47X51 STRL (DRAPES) ×3 IMPLANT
DRAPE U-SHAPE 76X120 STRL (DRAPES) ×6 IMPLANT
DRSG MEPILEX BORDER 4X8 (GAUZE/BANDAGES/DRESSINGS) ×3 IMPLANT
DRSG MEPITEL 4X7.2 (GAUZE/BANDAGES/DRESSINGS) ×6 IMPLANT
DRSG PAD ABDOMINAL 8X10 ST (GAUZE/BANDAGES/DRESSINGS) ×3 IMPLANT
DURAPREP 26ML APPLICATOR (WOUND CARE) ×6 IMPLANT
ELECT REM PT RETURN 9FT ADLT (ELECTROSURGICAL) ×3
ELECTRODE REM PT RTRN 9FT ADLT (ELECTROSURGICAL) ×2 IMPLANT
GAUZE SPONGE 4X4 16PLY XRAY LF (GAUZE/BANDAGES/DRESSINGS) IMPLANT
GAUZE XEROFORM 1X8 LF (GAUZE/BANDAGES/DRESSINGS) ×3 IMPLANT
GLOVE BIO SURGEON STRL SZ7.5 (GLOVE) ×6 IMPLANT
GLOVE BIOGEL PI IND STRL 8 (GLOVE) ×4 IMPLANT
GLOVE BIOGEL PI INDICATOR 8 (GLOVE) ×2
GOWN PREVENTION PLUS XLARGE (GOWN DISPOSABLE) ×3 IMPLANT
KIT BASIN OR (CUSTOM PROCEDURE TRAY) ×3 IMPLANT
KIT JUGGERKNOT DISP 2.9MM (KITS) ×6 IMPLANT
NDL SUT 6 .5 CRC .975X.05 MAYO (NEEDLE) IMPLANT
NEEDLE 1/2 CIR CATGUT .05X1.09 (NEEDLE) IMPLANT
NEEDLE MAYO TAPER (NEEDLE)
NEEDLE SCORPION MULTI FIRE (NEEDLE) IMPLANT
NS IRRIG 1000ML POUR BTL (IV SOLUTION) ×3 IMPLANT
PACK ARTHROSCOPY DSU (CUSTOM PROCEDURE TRAY) ×3 IMPLANT
PENCIL BUTTON HOLSTER BLD 10FT (ELECTRODE) ×3 IMPLANT
RESECTOR FULL RADIUS 4.2MM (BLADE) IMPLANT
SLING ARM FOAM STRAP LRG (SOFTGOODS) IMPLANT
SLING ARM FOAM STRAP MED (SOFTGOODS) IMPLANT
SLING ARM FOAM STRAP XLG (SOFTGOODS) IMPLANT
SLING ARM IMMOBILIZER MED (SOFTGOODS) IMPLANT
SPONGE GAUZE 4X4 12PLY (GAUZE/BANDAGES/DRESSINGS) ×3 IMPLANT
SPONGE LAP 4X18 X RAY DECT (DISPOSABLE) ×3 IMPLANT
STRIP CLOSURE SKIN 1/2X4 (GAUZE/BANDAGES/DRESSINGS) IMPLANT
SUCTION FRAZIER TIP 10 FR DISP (SUCTIONS) IMPLANT
SUPPORT WRAP ARM LG (MISCELLANEOUS) IMPLANT
SUT BONE WAX W31G (SUTURE) IMPLANT
SUT ETHIBOND 2 OS 4 DA (SUTURE) IMPLANT
SUT ETHILON 3 0 PS 1 (SUTURE) ×3 IMPLANT
SUT ETHILON 4 0 PS 2 18 (SUTURE) IMPLANT
SUT FIBERWIRE #2 38 T-5 BLUE (SUTURE)
SUT FIBERWIRE 2-0 18 17.9 3/8 (SUTURE) ×27
SUT MNCRL AB 3-0 PS2 18 (SUTURE) IMPLANT
SUT MNCRL AB 4-0 PS2 18 (SUTURE) IMPLANT
SUT PDS AB 0 CT 36 (SUTURE) IMPLANT
SUT PROLENE 3 0 PS 2 (SUTURE) ×3 IMPLANT
SUT VIC AB 0 CT1 18XCR BRD 8 (SUTURE) ×4 IMPLANT
SUT VIC AB 0 CT1 8-18 (SUTURE) ×2
SUT VIC AB 2-0 SH 18 (SUTURE) IMPLANT
SUTURE FIBERWR #2 38 T-5 BLUE (SUTURE) IMPLANT
SUTURE FIBERWR 2-0 18 17.9 3/8 (SUTURE) ×18 IMPLANT
SYR BULB 3OZ (MISCELLANEOUS) IMPLANT
TOWEL OR 17X24 6PK STRL BLUE (TOWEL DISPOSABLE) ×3 IMPLANT
TOWEL OR NON WOVEN STRL DISP B (DISPOSABLE) ×3 IMPLANT
TUBE CONNECTING 20X1/4 (TUBING) ×3 IMPLANT
TUBING ARTHROSCOPY IRRIG 16FT (MISCELLANEOUS) ×3 IMPLANT
WAND 90 DEG TURBOVAC W/CORD (SURGICAL WAND) IMPLANT
WATER STERILE IRR 1000ML POUR (IV SOLUTION) ×3 IMPLANT
YANKAUER SUCT BULB TIP NO VENT (SUCTIONS) IMPLANT

## 2011-08-11 NOTE — H&P (Signed)
David [name] is an 55 y.o. male.   Chief Complaint: [name] at work and hurt my shoulder HPI: 55 yo male presents for operative repair of his right shoulder after sustaining a fall at work onto the right shoulder. MRI subsequently revealed a massive rotator cuff tear involving the supraspinatus, infraspinatus, and subscapularis. Patient complains of significant pain and weakness involving the upper extremity.   Past Medical History  Diagnosis Date  . Allergy     RHINITIS  . Hyperlipidemia   . Rosacea   . Smoker   . GERD (gastroesophageal reflux disease)     "not really"  . PAF (paroxysmal atrial fibrillation)     Wears CPAP  . Sleep apnea     CPAP  . Shortness of breath     With activity  . Hypertension     takes Diovan and Toprol  . Arthritis     thumb    Past Surgical History  Procedure Date  . Shoulder closed reduction     Procedure: CLOSED REDUCTION SHOULDER;  Surgeon: Jeffie Pollock [facility];  Location: [facility] OR;  Service: Orthopedics;  Laterality: Right;  . Finger surgery     Reconstruction of Left thumb and L index finger.  Reconstruction   . Tonsillectomy     as a child  . Knee arthroscopy     Family History  Problem Relation Age of Onset  . Cancer Father     COLON  . Cancer Paternal Uncle   . Anesthesia problems Neg Hx    Social History:  reports that he has been smoking Cigarettes.  He has a 30 pack-year smoking history. He does not have any smokeless tobacco history on file. He reports that he drinks alcohol. He reports that he does not use illicit drugs.  Allergies:  Allergies  Allergen Reactions  . Codeine Shortness Of Breath and Swelling  . Morphine And Related Shortness Of Breath and Swelling    Medications Prior to Admission  Medication Dose Route Frequency Provider Last Rate Last Dose  . ceFAZolin (ANCEF) IVPB 2 g/50 mL premix  2 g Intravenous 60 min Pre-Op [name], PA      . povidone-iodine (BETADINE) 7.5 % scrub   Topical To [facility] [name], PA       Medications Prior to Admission  Medication Sig Dispense Refill  . metoprolol (TOPROL-XL) 50 MG 24 hr tablet Take 50 mg by mouth daily.        . cetirizine (ZYRTEC) 10 MG tablet Take 10 mg by mouth daily.         Results for orders placed during the hospital encounter of 08/09/11 (from the past 48 hour(s))  SURGICAL PCR SCREEN     Status: Normal   Collection Time   08/09/11  2:12 PM      Component Value Range Comment   MRSA, PCR NEGATIVE  NEGATIVE     Staphylococcus aureus NEGATIVE  NEGATIVE    CBC     Status: Normal   Collection Time   08/09/11  2:18 PM      Component Value Range Comment   WBC 10.1  4.0 - 10.5 (K/uL)    RBC 4.85  4.22 - 5.81 (MIL/uL)    Hemoglobin 15.6  13.0 - 17.0 (g/dL)    HCT 16.1  09.6 - 04.5 (%)    MCV 91.5  78.0 - 100.0 (fL)    MCH 32.2  26.0 - 34.0 (pg)    MCHC 35.1  30.0 - 36.0 (g/dL)    RDW 16.1  09.6 - 04.5 (%)    Platelets 241  150 - 400 (K/uL)   COMPREHENSIVE METABOLIC PANEL     Status: Abnormal   Collection Time   08/09/11  2:18 PM      Component Value Range Comment   Sodium 134 (*) 135 - 145 (mEq/L)    Potassium 4.0  3.5 - 5.1 (mEq/L)    Chloride 100  96 - 112 (mEq/L)    CO2 23  19 - 32 (mEq/L)    Glucose, Bld 93  70 - 99 (mg/dL)    BUN 19  6 - 23 (mg/dL)    Creatinine, Ser 4.09  0.50 - 1.35 (mg/dL)    Calcium 9.9  8.4 - 10.5 (mg/dL)    Total Protein 7.3  6.0 - 8.3 (g/dL)    Albumin 4.0  3.5 - 5.2 (g/dL)    AST 15  0 - 37 (U/L)    ALT 22  0 - 53 (U/L)    Alkaline Phosphatase 87  39 - 117 (U/L)    Total Bilirubin 0.4  0.3 - 1.2 (mg/dL)    GFR calc non Af Amer >90  >90 (mL/min)    GFR calc Af Amer >90  >90 (mL/min)    Dg Chest 2 View  08/09/2011  *RADIOLOGY REPORT*  Clinical Data: Preop for right shoulder surgery  CHEST - 2 VIEW  Comparison: Chest x-ray of 08/09/2009  Findings: The lungs are clear.  Somewhat prominent perihilar markings are present with peribronchial thickening which may indicate bronchitis, possibly  chronic.  The heart is mildly enlarged and stable.  No acute bony abnormality is seen.  IMPRESSION: No pneumonia.  Question bronchitis, possibly chronic.  Original Report Authenticated By: [name], M.D.    Review of Systems  Constitutional: Negative.   HENT: Negative.   Eyes: Negative.   Respiratory: Negative.   Cardiovascular: Negative.   Gastrointestinal: Negative.   Genitourinary: Negative.   Musculoskeletal: Positive for myalgias, joint pain and falls.  Skin: Negative.   Neurological: Negative.   Endo/Heme/Allergies: Negative.   Psychiatric/Behavioral: Negative.     Blood pressure 151/82, pulse 77, temperature 98.4 F (36.9 C), temperature source Oral, resp. rate 20, SpO2 96.00%. Physical Exam  Constitutional: He is oriented to person, place, and time. He appears well-developed.  HENT:  Head: Normocephalic.  Eyes: EOM are normal. Pupils are equal, round, and reactive to light.  Neck: Normal range of motion. Neck supple.  Cardiovascular: Normal rate, regular rhythm and normal heart sounds.   Respiratory: Effort normal and breath sounds normal.  GI: Bowel sounds are normal.  Musculoskeletal: He exhibits tenderness.       He has pain and obvious weakness consistent with a rct, neurovascular intact, digital rom, wrist rom, elbow rom intact. Arom, prom of shoulder difficult given pain and weakness.  Neurological: He is alert and oriented to person, place, and time.  Skin: Skin is warm and dry.  Psychiatric: He has a normal mood and affect.     Assessment/Plan 1.  Right shoulder massive rotator cuff tear 2.  See Past medical history We will proceed to the operative suite to perform a right shoulder arthroscopy, open rotator cuff repair, repair and reconstruction of the upper extremity as needed. We've discussed with the patient at length risks and benefits of the surgery.  [name] L 08/11/2011, 7:38 AM

## 2011-08-11 NOTE — Transfer of Care (Signed)
Immediate Anesthesia Transfer of Care Note  Patient: David [name]  Procedure(s) Performed:  Mora Appl CUFF REPAIR SHOULDER OPEN  Patient Location: PACU  Anesthesia Type: General and Regional  Level of Consciousness: oriented, sedated and patient cooperative  Airway & Oxygen Therapy: Patient Spontanous Breathing and Patient connected to face mask oxygen  Post-op Assessment: Report given to PACU RN, Post -op Vital signs reviewed and stable and Patient moving all extremities X 4  Post vital signs: Reviewed and stable 2 Complications: No apparent anesthesia complications

## 2011-08-11 NOTE — Anesthesia Procedure Notes (Signed)
Anesthesia Regional Block:  Supraclavicular block  Pre-Anesthetic Checklist: ,, timeout performed, Correct Patient, Correct Site, Correct Laterality, Correct Procedure, Correct Position, site marked, Risks and benefits discussed,  Surgical consent,  Pre-op evaluation,  At surgeon's request and post-op pain management  Laterality: Right and Upper  Prep: chloraprep       Needles:  Injection technique: Single-shot  Needle Type: Echogenic Stimulator Needle     Needle Length: 5cm 5 cm Needle Gauge: 22 and 22 G    Additional Needles:  Procedures: ultrasound guided Supraclavicular block Narrative:  Start time: 08/11/2011 7:45 AM End time: 08/11/2011 7:59 AM Injection made incrementally with aspirations every 5 mL.  Performed by: Personally  Anesthesiologist: Sheldon Silvan  Supraclavicular block

## 2011-08-11 NOTE — Addendum Note (Signed)
Addendum  created 08/11/11 1345 by Kerby Nora, MD   Modules edited:Orders

## 2011-08-11 NOTE — Anesthesia Postprocedure Evaluation (Signed)
  Anesthesia Post-op Note  Patient: David [name]  Procedure(s) Performed:  ROTATOR CUFF REPAIR SHOULDER OPEN  Patient Location: PACU  Anesthesia Type: GA combined with regional for post-op pain  Level of Consciousness: awake  Airway and Oxygen Therapy: Patient Spontanous Breathing and Patient connected to face mask oxygen  Post-op Pain: moderate  Post-op Assessment: Post-op Vital signs reviewed, Patient's Cardiovascular Status Stable, Respiratory Function Stable, Patent Airway, No signs of Nausea or vomiting and Pain level controlled  Post-op Vital Signs: Reviewed and stable  Complications: No apparent anesthesia complications

## 2011-08-11 NOTE — Brief Op Note (Signed)
08/11/2011  12:08 PM  PATIENT:  David [name]  55 y.o. male  PRE-OPERATIVE DIAGNOSIS:  right shoulder dislocation with rotator cuff rupture   POST-OPERATIVE DIAGNOSIS:  torn rotator cuff  PROCEDURE:  Procedure(s): ROTATOR CUFF REPAIR SHOULDER OPEN BICEPS TENODESIS RT RT SUBACROMIAL DECOMPRESSION/BURSECTOMY  SURGEON:  Surgeon(s): Oletta Cohn III  PHYSICIAN ASSISTANT:   ASSISTANTS: Wynona Neat [facility]   ANESTHESIA:   general  EBL:  Total I/O In: 2150 [I.V.:2150] Out: -   BLOOD ADMINISTERED:none  DRAINS: none   LOCAL MEDICATIONS USED:  NONE  SPECIMEN:  No Specimen  DISPOSITION OF SPECIMEN:  N/A  COUNTS:  YES  TOURNIQUET:  * No tourniquets in log *  DICTATION: .Other Dictation: Dictation Number [id number]  PLAN OF CARE: Admit to inpatient   PATIENT DISPOSITION:  PACU - hemodynamically stable.   Delay start of Pharmacological VTE agent (>24hrs) due to surgical blood loss or risk of bleeding:  {YES/NO/NOT APPLICABLE:20182

## 2011-08-12 MED ORDER — TEMAZEPAM 15 MG PO CAPS
15.0000 mg | ORAL_CAPSULE | Freq: Every evening | ORAL | Status: DC | PRN
  Administered 2011-08-12 – 2011-08-13 (×2): 15 mg via ORAL
  Filled 2011-08-12 (×2): qty 1

## 2011-08-12 NOTE — Progress Notes (Signed)
   CARE MANAGEMENT NOTE 08/12/2011  Patient:  David [name], David [name]   Account Number:  [id number]  Date Initiated:  08/12/2011  Documentation initiated by:  Tera Mater  Subjective/Objective Assessment:   DX: [name] at work.  Admitted to have right shoulder surgery on same day.     Action/Plan:   This is a worker's compensation case.  Will need approval from case worker for any [facility] services.   Anticipated DC Date:  08/14/2011   Anticipated DC Plan:  HOME W HOME HEALTH SERVICES      DC Planning Services  CM consult      Choice offered to / List presented to:             Status of service:  In process, will continue to follow Medicare Important Message given?  NO (If response is "NO", the following Medicare IM given date fields will be blank) Date Medicare IM given:   Date Additional Medicare IM given:    Discharge Disposition:    Per UR Regulation:    Comments:  08/12/11 1800--Pt. has HH orders for [facility] PT and HH aide. Spoke with pt. about contact information for worker's compensation and pt. could only remember the case worker's first name, Arline Asp. Pt. stated he could get this information tomorrow.  Will leave handoff for weekday CM to follow up. Tera Mater, RN, BSN [phone]

## 2011-08-12 NOTE — Progress Notes (Signed)
Patient placed on bilevel NIV for sleep apnea. Bipap on auto setting. Currently 6/4. Vitals within normal limits. Pt is tolerating bipap well at this time.

## 2011-08-12 NOTE — Progress Notes (Signed)
Subjective: 1 Day Post-Op Procedure(s) (LRB): ROTATOR CUFF REPAIR SHOULDER OPEN (Right) Patient reports pain as moderate. Patient is alert and oriented and had a good night sleep for the most part. He denies abdominal pain he denies lower extremity pain there no signs of DVT infection or compartment syndrome. I discussed all issues with him. He is voiding well. He is tolerating his diet. He is ready to get out of bed with assistance.  I discussed his care with he and his mother and his friends. I would recommend home health at this point as it would be advantageous for his recovery given the fact that he lives alone.   Objective: Vital signs in last 24 hours: Temp:  [97.1 F (36.2 C)-98.5 F (36.9 C)] 98.3 F (36.8 C) (12/23 0514) Pulse Rate:  [72-129] 76  (12/23 0514) Resp:  [11-28] 20  (12/23 0514) BP: (107-152)/(55-91) 140/73 mmHg (12/23 0514) SpO2:  [88 %-100 %] 97 % (12/23 0514) FiO2 (%):  [24 %] 24 % (12/22 1514)  Intake/Output from previous day: 12/22 0701 - 12/23 0700 In: 3200 [P.O.:100; I.V.:3100] Out: 1575 [Urine:1575] Intake/Output this shift:     Basename 08/09/11 1418  HGB 15.6    Basename 08/09/11 1418  WBC 10.1  RBC 4.85  HCT 44.4  PLT 241    Basename 08/09/11 1418  NA 134*  K 4.0  CL 100  CO2 23  BUN 19  CREATININE 0.87  GLUCOSE 93  CALCIUM 9.9   No results found for this basename: LABPT:2,INR:2 in the last 72 hours  Neurologically intact ABD soft Sensation intact distally Intact pulses distally No cellulitis present Compartment soft His incision is clean dry and intact. He has normal radial median and ulnar nerve function. I have gotten him out of his chair and discussed with him shoulder abduction pillow care he understands Lex and extension of the elbow pronation supination of the elbow wrist and finger range of motion exercises. He has good motor function there no sensory or motor deficits today. He denies neck pain. He denies abdominal  pain. His abdomen is protrude right total obese and nontender. His chest is clear. There is no signs of DVT infection or other neurovascular compromise. Overall I do think he looks very good on his physical examination given the fact that he had major reconstruction yesterday. Assessment/Plan: 1 Day Post-Op Procedure(s) (LRB): ROTATOR CUFF REPAIR SHOULDER OPEN (Right) Up with therapy Discharge home with home health will plan out of bed today.  We'll plan for consideration for home health. Will continue IV antibiotics. We will wean his IV fluid. We will work on activities of daily living with therapy and see how he looks into the future. Should he have any problems he'll notify me. I discussed his care with therapy and his nursing staff at length today.   I spent time with him showing him dressing care sling and he shoulder abduction pillow use. I've also discussed with him and showed him how to don and doff his icing apparatus. He is feeling much more comfortable. At the time of discussion we had him sit in a chair and he was much more comfortable in the upright position. Will keep a close eye on him.  I discussed with him the massive nature of his rotator cuff reconstruction. He had a 360 repair. He is rotator cuff was completely avulsed including subscapularis, supraspinatus, infraspinatus and portions of the teres minor. We went over the postop plans and plan of care.  Khrystyna Schwalm III,Mckinze Poirier  M 08/12/2011, 9:58 AM

## 2011-08-12 NOTE — Progress Notes (Signed)
Physical Therapy Evaluation Patient Details Name: David [name] MRN: [id number] DOB: [birth year] Today's Date: 08/12/2011  Problem List: There is no problem list on file for this patient.   Past Medical History:  Past Medical History  Diagnosis Date  . Allergy     RHINITIS  . Hyperlipidemia   . Rosacea   . Smoker   . GERD (gastroesophageal reflux disease)     "not really"  . PAF (paroxysmal atrial fibrillation)     Wears CPAP  . Sleep apnea     CPAP  . Shortness of breath     With activity  . Hypertension     takes Diovan and Toprol  . Arthritis     thumb   Past Surgical History:  Past Surgical History  Procedure Date  . Shoulder closed reduction     Procedure: CLOSED REDUCTION SHOULDER;  Surgeon: Jeffie Pollock [facility];  Location: [facility] OR;  Service: Orthopedics;  Laterality: Right;  . Finger surgery     Reconstruction of Left thumb and L index finger.  Reconstruction   . Tonsillectomy     as a child  . Knee arthroscopy     PT Assessment/Plan/Recommendation PT Assessment Clinical Impression Statement: 55 yo male s/p extensive open Rotator cuff repair Righ UE presents with decr functional mobility and impairments listed below; will benefit from PT services to maximize independence and safety with mobility/transfers/amb/steps in order to safely dc home with PRN assistance PT Recommendation/Assessment: Patient will need skilled PT in the acute care venue PT Problem List: Decreased range of motion;Decreased activity tolerance;Decreased balance;Decreased mobility;Decreased knowledge of use of DME;Pain;Decreased knowledge of precautions PT Therapy Diagnosis : Difficulty walking;Acute pain PT Plan PT Frequency: Min 5X/week PT Treatment/Interventions: DME instruction;Gait training;Stair training;Functional mobility training;Therapeutic activities;Therapeutic exercise;Patient/family education PT Recommendation Recommendations for Other Services: OT consult Follow Up  Recommendations: Home Health OT;  Home health PT;24 hour supervision/assistance Equipment Recommended:  (possibly cane) PT Goals  Acute Rehab PT Goals PT Goal Formulation: With patient Time For Goal Achievement: 7 days Pt will go Supine/Side to Sit: with modified independence Pt will go Sit to Supine/Side: with modified independence Pt will go Sit to Stand: with modified independence Pt will go Stand to Sit: with modified independence Pt will Ambulate: >150 feet;with modified independence;with least restrictive assistive device Pt will Go Up / Down Stairs: 3-5 stairs;with rail(s);with min assist  PT Evaluation Precautions/Restrictions  Precautions Precautions: Shoulder Precaution Comments: NO EXTERNAL ROTATION!!!! Required Braces or Orthoses: Yes Other Brace/Splint: Abduction sling Restrictions RUE Weight Bearing: Non weight bearing Prior Functioning  Home Living Lives With: Alone Receives Help From: Family;Friend(s) Type of Home: House Home Layout: Two level;Able to live on main level with bedroom/bathroom (Friend, Jim's place is on one level, pt is considering that) Home Access: Stairs to enter Entrance Stairs-Rails: Right;Left Entrance Stairs-Number of Steps: 3 Prior Function Level of Independence: Independent with basic ADLs;Independent with homemaking with wheelchair;Independent with homemaking with ambulation;Independent with gait;Independent with transfers Able to Take Stairs?: Yes Driving: Yes Vocation: Full time employment Cognition Cognition Arousal/Alertness: Awake/alert Overall Cognitive Status: Appears within functional limits for tasks assessed Orientation Level: Oriented X4 Sensation/Coordination Sensation Light Touch: Appears Intact Coordination Gross Motor Movements are Fluid and Coordinated: Yes Extremity Assessment RUE Assessment RUE Assessment: Exceptions to [facility] RUE Strength RUE Overall Strength Comments: In abduction sling; see also OT notes LUE  Assessment LUE Assessment: Within Functional Limits RLE Assessment RLE Assessment: Within Functional Limits LLE Assessment LLE Assessment: Within Functional Limits Mobility (including Balance) Bed  Mobility Bed Mobility: Yes Sit to Supine - Right: 4: Min assist;HOB flat (without physical contact) Sit to Supine - Right Details (indicate cue type and reason): cues for safe technique Transfers Transfers: Yes Sit to Stand: 4: Min assist;From chair/3-in-1;With upper extremity assist (without physical contact; with Left UE assist) Sit to Stand Details (indicate cue type and reason): heavily dependent on Left UE to push up from low-seat in recliner Stand to Sit: 5: Supervision;To bed Stand to Sit Details: cues for control; pt very much wanting to [facility] well without physical assist Ambulation/Gait Ambulation/Gait: Yes Ambulation/Gait Assistance: 4: Min assist (with and without physical contact) Ambulation/Gait Assistance Details (indicate cue type and reason): noted occasionally reaches out to left UE support on furniture Ambulation Distance (Feet): 30 Feet Assistive device: None       End of Session PT - End of Session Equipment Utilized During Treatment:  (sling) Activity Tolerance: Patient tolerated treatment well Patient left: in bed (positioned as comfortably as possible) Nurse Communication: Mobility status for transfers;Mobility status for ambulation General Behavior During Session: [facility] for tasks performed Cognition: [facility] for tasks performed  Van Clines [facility] [place], [place] [phone]  08/12/2011, 4:44 PM

## 2011-08-12 NOTE — Progress Notes (Signed)
08/12/2011 OT evaluation (see ghost chart for detailed evalutation)  Patient is being seen acute By OT services secondary to:  Pt. has acute therapy needs. Please refer to evaluation form in ghost chart for therapy notes. Recommend pt. progress rehab of shoulder as ordered by MD post follow-up appointment. Pt. Required Max A with ADLs and is Mod A with ADL Mobility. Pt will have all necessary A and DME for safe d/c home. OT will see 08/13/11 prior to pt d/c home to continue education.  Progress and goals were discussed with patient and pt agreed.      Harrel Carina [place]   OTR/L Pager: [phone] Office: [phone] .

## 2011-08-13 MED ORDER — HYDROMORPHONE HCL 4 MG PO TABS: 4.0000 mg | ORAL_TABLET | ORAL | Status: AC | PRN

## 2011-08-13 MED ORDER — SENNA 8.6 MG PO TABS: 1.0000 | ORAL_TABLET | Freq: Two times a day (BID) | ORAL | Status: DC

## 2011-08-13 MED ORDER — METHOCARBAMOL 500 MG PO TABS: 500.0000 mg | ORAL_TABLET | Freq: Four times a day (QID) | ORAL | Status: AC | PRN

## 2011-08-13 MED ORDER — ALUM & MAG HYDROXIDE-SIMETH 200-200-20 MG/5ML PO SUSP: 30.0000 mL | Freq: Three times a day (TID) | ORAL | Status: DC | PRN

## 2011-08-13 MED ORDER — ALPRAZOLAM 0.5 MG PO TABS: 0.5000 mg | ORAL_TABLET | Freq: Four times a day (QID) | ORAL | Status: AC | PRN

## 2011-08-13 MED ORDER — ASCORBIC ACID 1000 MG PO TABS: 1000.0000 mg | ORAL_TABLET | Freq: Every day | ORAL | Status: DC

## 2011-08-13 NOTE — Progress Notes (Signed)
CARE MANAGEMENT NOTE 08/13/2011 Called Patient's worker's comp office- [phone] they are closed until 08/15/11. Will follow upon Wednesday.

## 2011-08-13 NOTE — Progress Notes (Signed)
Physical Therapy Treatment Patient Details Name: David [name] MRN: [id number] DOB: Sep 05, 1955 Today's Date: 08/13/2011  PT Assessment/Plan  PT - Assessment/Plan Comments on Treatment Session: good progress in activity tolerance; able to verbalize precautions correctly; on track for dc home tomorrow from PT standpoint PT Plan: Discharge plan remains appropriate PT Frequency: Min 5X/week Follow Up Recommendations: Home health PT;24 hour supervision/assistance Equipment Recommended: None recommended by PT PT Goals  Acute Rehab PT Goals Pt will go Supine/Side to Sit: with modified independence PT Goal: Supine/Side to Sit - Progress: Other (comment) Pt will go Sit to Supine/Side: with modified independence PT Goal: Sit to Supine/Side - Progress: Other (comment) Pt will go Sit to Stand: with modified independence PT Goal: Sit to Stand - Progress: Progressing toward goal Pt will go Stand to Sit: with modified independence PT Goal: Stand to Sit - Progress: Progressing toward goal Pt will Ambulate: >150 feet;with modified independence;with least restrictive assistive device PT Goal: Ambulate - Progress: Progressing toward goal Pt will Go Up / Down Stairs: 3-5 stairs;with rail(s);with min assist PT Goal: Up/Down Stairs - Progress: Progressing toward goal  PT Treatment Precautions/Restrictions  Precautions Precautions: Shoulder Precaution Comments: NO EXTERNAL ROTATION!!!! Required Braces or Orthoses: Yes Other Brace/Splint: Abduction sling Restrictions Weight Bearing Restrictions: Yes RUE Weight Bearing: Non weight bearing Mobility (including Balance) Bed Mobility Bed Mobility: No Supine to Sit:  (Pt=50) Sit to Supine - Right:  (NT) Sit to Supine - Right Details (indicate cue type and reason): NT Transfers Sit to Stand: 4: Min assist;From chair/3-in-1;With upper extremity assist Sit to Stand Details (indicate cue type and reason): Tends to rotate body and trunk to left to use Left  UE to push up from low surface; boosted pt's chair seat height for better comfort and ease Stand to Sit: 5: Supervision;To chair/3-in-1;With armrests (Left armrest) Stand to Sit Details: pt indicated better ease and comfort with seat height adjusted Ambulation/Gait Ambulation/Gait Assistance: 4: Min assist (progressing to Supervision) Ambulation/Gait Assistance Details (indicate cue type and reason): overall pretty steady; noted occasional stutter steps, but pt able to regain balance without physical assist Ambulation Distance (Feet): 200 Feet (greater than) Assistive device: None Stairs: Yes Stairs Assistance: 4: Min assist (guard assist without physical contact) Stairs Assistance Details (indicate cue type and reason): managing well; leads with Right ascending and Left descending as pt has Left knee pain; pt describes and demonstrates appropriate stair managing  Stair Management Technique: One rail Left Number of Stairs: 5     Exercise    End of Session PT - End of Session Activity Tolerance: Patient tolerated treatment well Patient left: in chair;with call Prochazka in reach Nurse Communication: Mobility status for transfers;Mobility status for ambulation General Behavior During Session: [facility] for tasks performed Cognition: [facility] for tasks performed  Van Clines [facility] [place], [place] [phone]  08/13/2011, 10:17 AM

## 2011-08-13 NOTE — Progress Notes (Signed)
Occupational Therapy Treatment  Patient Details Name: David [name] MRN: [id number] DOB: 09-01-1955 Today's Date: 08/13/2011  OT Assessment/Plan OT Assessment/Plan Comments on Treatment Session: pt will require A for d/c home. Pt requires Mod A to don/doff sling. Pt with fair recall of instructions though requiring information from 08/12/11 to be provided this session requiring ROM restrictions. OT Plan: Discharge plan remains appropriate OT Frequency: Min 3X/week Follow Up Recommendations: Home health OT Equipment Recommended: None recommended by PT OT Goals Acute Rehab OT Goals OT Goal Formulation: With patient Time For Goal Achievement: 7 days Arm Goals Pt Will Perform AROM: Independently;10 reps Arm Goal: AROM - Progress: Met Additional Arm Goal #1: Pt will don and doff sling Mod I as precursor to adls Additional Arm Goal #2: Pt will be able to verbalize min A from family correct positioning of Rt UE as precursor to ADls and maintain precautions  OT Treatment Precautions/Restrictions  Precautions Precautions: Shoulder Precaution Comments: NO EXTERNAL ROTATION!!!! Required Braces or Orthoses: Yes Other Brace/Splint: Abduction sling Restrictions Weight Bearing Restrictions: Yes RUE Weight Bearing: Non weight bearing   ADL ADL Eating/Feeding: Performed;Set up Where Assessed - Eating/Feeding: Chair Grooming: Performed;Minimal assistance;Wash/dry face;Set up (hand over hand to initiate task then pt completing task) Where Assessed - Grooming: Sitting, bed;Supported Upper Body Bathing: Performed;Chest;Right arm;Left arm;Abdomen;Set up Where Assessed - Upper Body Bathing: Sitting, chair;Supported Lower Body Bathing: Performed;Minimal assistance Where Assessed - Lower Body Bathing: Standing at sink Upper Body Dressing: Performed;Minimal assistance Upper Body Dressing Details (indicate cue type and reason): educated on don / doff sling with positioning Where Assessed - Upper Body  Dressing: Sitting, chair;Supported Mobility  Bed Mobility Bed Mobility: No Supine to Sit:  (Pt=50) Sit to Supine - Right:  (NT) Sit to Supine - Right Details (indicate cue type and reason): NT Transfers Transfers: Yes Sit to Stand: 4: Min assist;From chair/3-in-1;With upper extremity assist Sit to Stand Details (indicate cue type and reason): Tends to rotate body and trunk to left to use Left UE to push up from low surface; boosted pt's chair seat height for better comfort and ease Stand to Sit: 5: Supervision;To chair/3-in-1;With armrests (Left armrest) Stand to Sit Details: pt indicated better ease and comfort with seat height adjusted Exercises    End of Session General Behavior During Session: [facility] for tasks performed Cognition: [facility] for tasks performed  Lucile Shutters  08/13/2011, 10:18 AM Pager: [phone]

## 2011-08-13 NOTE — Progress Notes (Addendum)
Pt is s/p R shoulder repair. Alert and oriented x 3.  1+ nonpitting edema of R shoulder/arm. +CMS. Pt is only to move fingers and wrist but no external rotation of RUE per order. Pt has a dry mepilex to R shoulder and an abduction immobilizer sling in place. NWB RUE. Pt has ice continuously to R shoulder. Pt voids quantity sufficieint without difficulty. Pt repts LBM 12/21. Pt repts passing gas and denies nausea or vomiting. Pt skin noted to be flushed red at times. Abdomen soft flat nontender and nondistended. Pt lungs CTA but noted to be diminished throughout. Pt repts prod cough with small amount of clear to yellow thin phlegm. No s/sx cardiac or resp distress and no c/o such. Pt performs IS per order. Pt sats 96% 2lpm. Pt, per rept and per his admission, has a past medical history of sleep apnea and CPAP at bedtime being used at HS. Pt can turn self and floats heels. No pressure skin issues noted of heels or sacral area.

## 2011-08-13 NOTE — Progress Notes (Signed)
Subjective: 2 Days Post-Op Procedure(s) (LRB): ROTATOR CUFF REPAIR SHOULDER OPEN (Right) Patient reports pain as mild.  Patient states that he is transitioning to pain medicine by mouth without significant difficulty. He notes he still has pain in the shoulder. He has good sensation in the hand. He was voiding well. He has normal gas pattern to his abdomen but that has not had a bowel movement yet. he is tolerating a regular diet nicely. He notes no locking popping catching numbness or tingling in the upper or lower extremities. He does not have any leg pain or signs of DVT.  Objective: Vital signs in last 24 hours: Temp:  [98.5 F (36.9 C)-98.8 F (37.1 C)] 98.6 F (37 C) (12/24 0504) Pulse Rate:  [75-80] 75  (12/24 0504) Resp:  [18-20] 18  (12/24 0504) BP: (122-141)/(71-79) 124/79 mmHg (12/24 0504) SpO2:  [91 %-96 %] 96 % (12/24 0504) Weight:  [168.6 kg (371 lb 11.1 oz)] 371 lb 11.1 oz (168.6 kg) (12/23 1820)  Intake/Output from previous day: 12/23 0701 - 12/24 0700 In: 1072 [P.O.:420; I.V.:600; IV Piggyback:52] Out: 600 [Urine:600] Intake/Output this shift:    No results found for this basename: HGB:5 in the last 72 hours No results found for this basename: WBC:2,RBC:2,HCT:2,PLT:2 in the last 72 hours No results found for this basename: NA:2,K:2,CL:2,CO2:2,BUN:2,CREATININE:2,GLUCOSE:2,CALCIUM:2 in the last 72 hours No results found for this basename: LABPT:2,INR:2 in the last 72 hours  Neurologically intact ABD soft Neurovascular intact Sensation intact distally Intact pulses distally No cellulitis present Compartment soft  His physical examination looks quite well overall there's no signs of infection dystrophy or neurovascular compromise incision is clean dry and intact. We went over all issues with him in detail. He is able to ambulate and in an improved fashion in the room. Will have therapy give him up today for ambulation of the measures. Will likely discharge him  tomorrow. There are no signs of DVT chest is clear HEENT is within normal limits abdomen is nontender protruding slightly obese.  Assessment/Plan: 2 Days Post-Op Procedure(s) (LRB): ROTATOR CUFF REPAIR SHOULDER OPEN (Right) Up with therapy Plan for discharge tomorrow  We will plan for discharge tomorrow and follow up in my office in 9-10 days. I discussed and the relevant issues these notes etc. Shouldn't problems arise from notify me.  Karen Chafe 08/13/2011, 8:26 AM

## 2011-08-14 NOTE — Progress Notes (Signed)
Subjective: Patient doing well no complaints  Objective: Vital signs in last 24 hours: Temp:  [98.2 F (36.8 C)-99.5 F (37.5 C)] 98.2 F (36.8 C) (12/25 0603) Pulse Rate:  [80-88] 88  (12/25 0603) Resp:  [18-22] 22  (12/25 0603) BP: (112-126)/(64-74) 112/64 mmHg (12/25 0603) SpO2:  [96 %-97 %] 97 % (12/25 0603)  Intake/Output from previous day: 12/24 0701 - 12/25 0700 In: 580 [P.O.:580] Out: 400 [Urine:400] Intake/Output this shift:    No results found for this basename: HGB:5 in the last 72 hours No results found for this basename: WBC:2,RBC:2,HCT:2,PLT:2 in the last 72 hours No results found for this basename: NA:2,K:2,CL:2,CO2:2,BUN:2,CREATININE:2,GLUCOSE:2,CALCIUM:2 in the last 72 hours No results found for this basename: LABPT:2,INR:2 in the last 72 hours  Neurovascular intact right upper extremity Wound benign  Assessment/Plan: S/p right shoulder surgery Discharge to home   CLARK,GILBERT W. 08/14/2011, 2:06 PM

## 2011-08-15 NOTE — Op Note (Signed)
NAME:  David [name], David [name]                       ACCOUNT NO.:  MEDICAL RECORD NO.:  [id number]  LOCATION:                                 FACILITY:  PHYSICIAN:  Dionne Ano. Morgen Linebaugh, M.D.DATE OF BIRTH:  Nov 12, 1955  DATE OF PROCEDURE: DATE OF DISCHARGE:                              OPERATIVE REPORT   PREOPERATIVE DIAGNOSIS:  Status post dislocation of right shoulder with complete rotator cuff tear avulsion including subscapularis, infraspinatus, and supraspinous and displacement of the biceps out of its groove secondary to shearing avulsion from the sheath region.  POSTOPERATIVE DIAGNOSIS:  Status post dislocation of right shoulder with complete rotator cuff tear avulsion including subscapularis, infraspinatus, and supraspinous and displacement of the biceps out of its groove secondary to shearing avulsion from the sheath region.  PROCEDURES: 1. Open 360 rotator cuff repaired.  This included rotator cuff repair     of the infraspinatus, supraspinatus, and subscapularis. 2. Bursectomy and acromioplasty, right shoulder. 3. Biceps tenodesis and tenotomy, right shoulder.  SURGEON:  Dionne Ano. Amanda Pea, MD  ASSISTANT:  Karie Chimera, PA-C  COMPLICATIONS:  None.  ANESTHESIA:  General with preoperative block by Dr. Ivin Booty.  ESTIMATED BLOOD LOSS:  Less than 100 mL.  INDICATIONS FOR PROCEDURE:  This patient is a 55 year old who sustained a violent anterior dislocation to the shoulder.  He was seen and initially treated by Dr. Yevette Edwards.  He had a complete loss of his anatomic parameters and was taken to the operative arena for closure duction.  Following closed reduction, he was splinted and I was asked to see and take over his care.  The patient was seen in my office.  MRI revealed complete rotator cuff tear.  He had no ability to move the arm, whatsoever.  I did discuss with him from issues of possible brachial plexus injury, etc., but certainly given the MRI findings,  all  findings supported massive cuff tear including subscapularis tear.  This was confirmed with MRI and he was set up for surgery.  OPERATIVE FINDINGS:  This patient unfortunately had a 360 tear of the subscapular area, infraspinatus, supraspinous and even portions of the teres were avulsed.  He had a bare humeral head.  No stability and no meaningful rotator cuff at all.  He underwent repair and I was actually quite pleased to repair, but would stress at the level difficulty was very high in terms of getting his repair done.  OPERATION IN DETAIL:  The patient was seen by myself and Anesthesia, given a preoperative block by Dr. Sheldon Silvan in the holding area.  He was counseled.  Pre and postoperative exam was discussed with him. Following this, the patient was taken to the operative arena and underwent a general anesthetic.  Once this was done, he was laid in the beach chair position.  SCD compression devices were placed about his lower extremities to prevent DVT.  He was position nicely.  Body parts were well padded.  Pulse was checked and the arm was then evaluated.  He certainly had looseness in his arm and anterior dislocation tendencies all way into the subscap tear.  With this noted, I felt that  arthroscopic intervention would be fairly meaningless.  His acromion was not overly hooked and I did not plan to address this distal clavicle. With this type of 360 rupture, I felt that an open procedure would be in his best interest.  At this time, he was prepped and draped in the usual sterile fashion and once this was done an anterior lateral incision was made just off the anterior border of the acromion.  Dissection was carried down.  I elevated tissue planes nicely and entered an interval between the anterior and middle deltoid raphe.  Dissection was carried down and bursectomy was accomplished followed by limited subacromial decompression.  He did not have an overly hooked acromion.  I  did not take down the coracoacromial ligament as I wanted to maintain an anterior humeral sling.  I did not take down the clavicle.  Fingertip palpation showed no evidence of advanced abnormality in the [facility] joint in my opinion.  At this time, I was looking at a bare humeral head with complete rupture of the infraspinatus, supraspinatus, and subscapularis. The biceps tendon was inside the joint and out of its groove secondary to rupture.  Following bursectomy and decompression, I then performed a biceps tenotomy, tagged the biceps for later tenodesis.  I then very carefully mobilized the rotator cuff.  With great difficulty, I placed stay sutures in the cuff and then gently mobilized the portions of the teres minor, infraspinatus, and then supraspinatus.  Following this, I very carefully and cautiously fingertip palpated the coracobrachialis and other areas.  I tried to stay well away from the axillary nerve. With this performed, I then was able to very carefully and gently identify the subscapularis.  The subscapularis was identified and gathered for repair.  Stay stitch was placed in the subscap.  Once this was done, I then placed three 5.5 Arthrex anchors off the tuberosity to allow for a nice footprint for rotator cuff insertion.  I placed an initial juggernaut over the subscapularis site and then replaced it with a 6.5 Arthrex anchor as the juggernaut did not have good support.  Thus, he had 4 suture anchors placed, the 6.5 corkscrew was placed anteriorly off the footprint for the subscap.  Following this, I then very carefully placed sutures through the rotator cuff beginning posteriorly and working anteriorly.  The patient had a total of 12 sutures were placed through the rotator cuff about the supraspinatus and infraspinatus and 4 through the subscapular.  I very carefully preplanned all stitches and used the scorpion passer for this purpose.  Following this, the patient was  irrigated very copiously and I then tied down the cuff.  The cuff actually had a good nice secure, fit, and footprint.  Following this, two 4.5 PushLock were placed to secure the rotator cuff about the supraspinous and infraspinatus and teres minor region.  The patient was placed in internal rotation for tie-down of the subscap as this was highly frayed and not the best quality, but nevertheless I was pleased with the bites that I got and the fixation. I tied this down over the PushLock that had been previously placed. Following this, the biceps was secured in a tenodesed state.  I left a little bit lax on purpose.  Given the patient's arm contours, I felt this would be just fine and made sure there was no undue tension.  Thus, biceps tenotomy, tenodesis, and massive 360 rotator cuff repair was accomplished.  It was stable at the conclusion of the case.  I irrigated it copiously at multiple points and could not overstate how difficult it was for purposes of repair.  I did not perform a Bankart repair.  I did not see an osseous defect on the humerus fortunately. The patient tolerated this well.  At the conclusion of the case, I actually placed a small lock of the Bovie against the deltoid anteriorly and then middle portion to demonstrate good firing.  I felt this would be helpful to secure size, document the axillary nerve was intact and it was.  The muscle still fired quite nicely.  There was no evidence of denervation of the deltoid musculature.  Following this, the patient then was irrigated copiously once again.  The deltoid was closed with Vicryl and the subcu with Vicryl and the skin with Prolene.  A sterile dressing was applied and abduction sling was placed.  I am going to keep the patient in the abduction sling.  I want him to be highly protected for the first 6-8 weeks with passive motion only and I am going to protect the subscap repair for 6-8 weeks minimum.  I did not want  him externally rotating beyond 0 as I feel this would jeopardize our repair.  I would rather have this patient little stiff than loose in a habitual dislocator.  The patient understands this.  He will be admitted for IV antibiotics and general postoperative observatory care pattern.  Do's and don'ts have been discussed.  We will plan for full CPAP precautions, IV antibiotics, and other measures as discussed with the patient.  Should any problems arise, he will notify us.  Otherwise, he will be an inpatient until discharged from [facility].     Dionne Ano. Amanda Pea, M.D.     [facility]  D:  08/11/2011  T:  08/12/2011  Job:  [id number]

## 2011-08-16 ENCOUNTER — Encounter (HOSPITAL_COMMUNITY): Payer: Self-pay | Admitting: Orthopedic Surgery

## 2011-10-02 ENCOUNTER — Encounter (HOSPITAL_COMMUNITY): Payer: Self-pay

## 2012-04-29 ENCOUNTER — Emergency Department (HOSPITAL_COMMUNITY)
Admission: EM | Admit: 2012-04-29 | Discharge: 2012-04-29 | Disposition: A | Discharge status: Home / Self Care | Payer: BC Managed Care – PPO | Attending: Emergency Medicine | Admitting: Emergency Medicine

## 2012-04-29 ENCOUNTER — Encounter (HOSPITAL_COMMUNITY): Payer: Self-pay | Admitting: Family Medicine

## 2012-04-29 ENCOUNTER — Emergency Department (HOSPITAL_COMMUNITY): Payer: BC Managed Care – PPO

## 2012-04-29 DIAGNOSIS — I10 Essential (primary) hypertension: Secondary | ICD-10-CM | POA: Insufficient documentation

## 2012-04-29 DIAGNOSIS — F17200 Nicotine dependence, unspecified, uncomplicated: Secondary | ICD-10-CM | POA: Insufficient documentation

## 2012-04-29 DIAGNOSIS — M19049 Primary osteoarthritis, unspecified hand: Secondary | ICD-10-CM | POA: Insufficient documentation

## 2012-04-29 DIAGNOSIS — Z886 Allergy status to analgesic agent status: Secondary | ICD-10-CM | POA: Insufficient documentation

## 2012-04-29 DIAGNOSIS — G4730 Sleep apnea, unspecified: Secondary | ICD-10-CM | POA: Insufficient documentation

## 2012-04-29 DIAGNOSIS — S63509A Unspecified sprain of unspecified wrist, initial encounter: Secondary | ICD-10-CM | POA: Insufficient documentation

## 2012-04-29 DIAGNOSIS — R0789 Other chest pain: Secondary | ICD-10-CM | POA: Insufficient documentation

## 2012-04-29 DIAGNOSIS — W010XXA Fall on same level from slipping, tripping and stumbling without subsequent striking against object, initial encounter: Secondary | ICD-10-CM | POA: Insufficient documentation

## 2012-04-29 DIAGNOSIS — E785 Hyperlipidemia, unspecified: Secondary | ICD-10-CM | POA: Insufficient documentation

## 2012-04-29 DIAGNOSIS — I4891 Unspecified atrial fibrillation: Secondary | ICD-10-CM | POA: Insufficient documentation

## 2012-04-29 DIAGNOSIS — S63501A Unspecified sprain of right wrist, initial encounter: Secondary | ICD-10-CM

## 2012-04-29 DIAGNOSIS — K219 Gastro-esophageal reflux disease without esophagitis: Secondary | ICD-10-CM | POA: Insufficient documentation

## 2012-04-29 NOTE — ED Notes (Addendum)
Pt reports he fell and right hand got caught under a piece of metal. Right wrist appears swollen, reports pain. CMS intact. Pt reports hitting his chest against something during the fall. Red area and small scratches noted to center of chest.

## 2012-04-29 NOTE — ED Provider Notes (Signed)
History     CSN: [id number]  Arrival date & time 04/29/12  1707   First MD Initiated Contact with Patient 04/29/12 1734      Chief Complaint  Patient presents with  . Wrist Injury  . Chest Injury   HPI  History provided by the patient. Patient is a 56 year old male with past history of arthritis who presents with complaints of right wrist pain and injury after fall. Patient states he tripped over an extension cord falling onto his right hand which also cause a 8" "I-beam" to fall and hit wrist. Since that time patient reports increasing pain and mild swelling to the wrist. Pain is worse with movements. Denies any weakness or numbness to the hand or fingers. Patient has not used any treatment for symptoms. he denies any other injuries or complaints.    Past Medical History  Diagnosis Date  . Allergy     RHINITIS  . Hyperlipidemia   . Rosacea   . Smoker   . GERD (gastroesophageal reflux disease)     "not really"  . PAF (paroxysmal atrial fibrillation)     Wears CPAP  . Sleep apnea     CPAP  . Shortness of breath     With activity  . Hypertension     takes Diovan and Toprol  . Arthritis     thumb    Past Surgical History  Procedure Date  . Shoulder closed reduction     Procedure: CLOSED REDUCTION SHOULDER;  Surgeon: Jeffie Pollock [facility];  Location: [facility] OR;  Service: Orthopedics;  Laterality: Right;  . Finger surgery     Reconstruction of Left thumb and L index finger.  Reconstruction   . Tonsillectomy     as a child  . Knee arthroscopy   . Shoulder open rotator cuff repair 08/11/2011    Procedure: ROTATOR CUFF REPAIR SHOULDER OPEN;  Surgeon: Oletta Cohn III;  Location: [facility] OR;  Service: Orthopedics;  Laterality: Right;    Family History  Problem Relation Age of Onset  . Cancer Father     COLON  . Cancer Paternal Uncle   . Anesthesia problems Neg Hx     History  Substance Use Topics  . Smoking status: Current Everyday Smoker -- 1.0 packs/day for 30 years     Types: Cigarettes  . Smokeless tobacco: Not on file  . Alcohol Use: Yes     occ      Review of Systems  Musculoskeletal:       Wrist pain and swelling  Neurological: Negative for weakness and numbness.    Allergies  Codeine and Morphine and related  Home Medications   Current Outpatient Rx  Name Route Sig Dispense Refill  . ALPRAZOLAM 1 MG PO TABS Oral Take 0.5 mg by mouth 3 (three) times daily as needed. anxiety    . ASPIRIN EC 81 MG PO TBEC Oral Take 81 mg by mouth daily.     Marland Kitchen CETIRIZINE-PSEUDOEPHEDRINE ER 5-120 MG PO TB12 Oral Take 1 tablet by mouth 2 (two) times daily.      Marland Kitchen METHOCARBAMOL 500 MG PO TABS Oral Take 500 mg by mouth 2 (two) times daily as needed. Muscle spasms    . METOPROLOL SUCCINATE ER 50 MG PO TB24 Oral Take 50 mg by mouth daily.      Marland Kitchen VALSARTAN 160 MG PO TABS Oral Take 160 mg by mouth daily.       BP 145/78  Pulse 82  Temp 98.3 F (  36.8 C) (Oral)  Resp 18  SpO2 97%  Physical Exam  Nursing note and vitals reviewed. Constitutional: He appears well-developed and well-nourished. No distress.  HENT:  Head: Normocephalic.  Cardiovascular: Normal rate and regular rhythm.   Pulmonary/Chest: Effort normal and breath sounds normal. No respiratory distress. He has no rales.  Musculoskeletal: Normal range of motion. He exhibits edema and tenderness.       Mild swelling over right dorsal wrist. Normal range of motion. No gross deformities. Normal grip strength. Normal sensation and cap refill in fingers. There is tenderness palpation over the anatomical snuff box area and dorsal aspect of wrist. There is small superficial abrasion of skin over distal radial aspect. Skin otherwise appears normal without erythema.    ED Course  Procedures   Labs Reviewed - No data to display Dg Chest 2 View  04/29/2012  *RADIOLOGY REPORT*  Clinical Data: Fall today.  Chest pain.  Blunt trauma.  CHEST - 2 VIEW  Comparison: Two-view chest 08/09/2011  Findings: The heart  size is normal.  The mild interstitial coarsening is chronic.  No focal airspace disease is evident.  The visualized soft tissues and bony thorax are unremarkable.  IMPRESSION:  1.  No acute cardiopulmonary disease or significant interval change. 2.  Stable chronic interstitial coarsening.   Original Report Authenticated By: Jamesetta Orleans. MATTERN, M.D.    Dg Wrist Complete Right  04/29/2012  *RADIOLOGY REPORT*  Clinical Data: Trauma.  Fall.  Trauma to wrist and hand.  Right wrist pain and swelling.  RIGHT WRIST - COMPLETE 3+ VIEW  Comparison: None.  Findings: The wrist is located.  Mild soft tissue swelling is noted over the ulnar and dorsal aspect of the wrist.  No acute osseous abnormality is evident.  Mild degenerative changes are present.  IMPRESSION:  1.  Soft tissue swelling over the dorsal and ulnar aspect of the wrist. 2.  No acute osseous abnormality.   Original Report Authenticated By: Jamesetta Orleans. MATTERN, M.D.      1. Sprain of wrist, right       MDM  6:05 PM patient seen and evaluated. Patient offered medications but declines at this time. X-rays do not show any fractures at this time. Patient does have tenderness over snuff box and distal area. No gross deformities. Normal range of motion of digits. Will place patient in thumb spica splint given orthopedic hand followup as needed.        Angus Seller, [place] 04/29/12 [phone]

## 2012-04-29 NOTE — ED Notes (Signed)
Ortho tech called 

## 2012-05-03 NOTE — ED Provider Notes (Signed)
Medical screening examination/treatment/procedure(s) were performed by non-physician practitioner and as supervising physician I was immediately available for consultation/collaboration.  Jones Skene, M.D.     Jones Skene, MD 05/03/12 1645

## 2012-09-26 ENCOUNTER — Ambulatory Visit (INDEPENDENT_AMBULATORY_CARE_PROVIDER_SITE_OTHER): Payer: BC Managed Care – PPO | Admitting: Medical

## 2012-09-26 ENCOUNTER — Encounter: Payer: Self-pay | Admitting: Medical

## 2012-09-26 VITALS — BP 118/80 | HR 73 | Temp 97.9°F | Resp 18 | Wt 336.0 lb

## 2012-09-26 DIAGNOSIS — M25519 Pain in unspecified shoulder: Secondary | ICD-10-CM

## 2012-09-26 DIAGNOSIS — M719 Bursopathy, unspecified: Secondary | ICD-10-CM

## 2012-09-26 DIAGNOSIS — I10 Essential (primary) hypertension: Secondary | ICD-10-CM

## 2012-09-26 DIAGNOSIS — M25511 Pain in right shoulder: Secondary | ICD-10-CM

## 2012-09-26 DIAGNOSIS — G4700 Insomnia, unspecified: Secondary | ICD-10-CM

## 2012-09-26 DIAGNOSIS — H109 Unspecified conjunctivitis: Secondary | ICD-10-CM

## 2012-09-26 DIAGNOSIS — M67919 Unspecified disorder of synovium and tendon, unspecified shoulder: Secondary | ICD-10-CM

## 2012-09-26 MED ORDER — ALPRAZOLAM 1 MG PO TABS: 1.0000 mg | ORAL_TABLET | Freq: Every evening | ORAL | Status: DC | PRN

## 2012-09-26 MED ORDER — ERYTHROMYCIN 5 MG/GM OP OINT: TOPICAL_OINTMENT | Freq: Four times a day (QID) | OPHTHALMIC | Status: DC

## 2012-09-26 MED ORDER — METHOCARBAMOL 500 MG PO TABS: ORAL_TABLET | ORAL | Status: DC

## 2012-09-26 NOTE — Progress Notes (Signed)
Subjective: [name] is here today reportedly for pink eye.  This is my first visit with him.  His last visit here was over a year ago.  Here for concern for pink eye.  He notes 1 week hx/o left eye swollen, watery, pus drainage, crusted and matted in the morning.  Denies loss of vision, no fever, no ear pain, no sore throat.  Right eye is fine.   No contacts with similar problems recently.  Using nothing for the symptoms.   As we were basically finished with the visit he brings up 2 more complaints.  He reports hx/o fall 06/2011, ripped his right shoulder with the fall, tore the rotator cuff really bad.  Had surgery with Dr. Amanda Pea, went through months of physical therapy.  He has what he thinks is his last f/u with Dr. Amanda Pea today, although he still has poor ROM and limited use of the right shoulder.  There was talk of additional repair or revision, but he is skeptical and concerned that he doesn't want to pursue this if he is not going to be any better.  He is asking for a prn supply of Robaxin.  He was given this during the past year for spasm and soreness in the shoulder and it helped.  Used it a few times per week on average, but typically uses this during the day when he is home.  He also requests a refill on xanax for sleep.  Witt the shoulder problems he sometimes gets up in the night due to shoulder pains.  Xanax helps him get to sleep.  Doesn't use this all the time.  Dr. Susann Givens has prescribed this for him prior.    Past Medical History  Diagnosis Date  . Allergy     RHINITIS  . Hyperlipidemia   . Rosacea   . Smoker   . GERD (gastroesophageal reflux disease)     "not really"  . PAF (paroxysmal atrial fibrillation)     Wears CPAP  . Sleep apnea     CPAP  . Shortness of breath     With activity  . Hypertension     takes Diovan and Toprol  . Arthritis     thumb   Objective: Filed Vitals:   09/26/12 0912  BP: 118/80  Pulse: 73  Temp: 97.9 F (36.6 C)  Resp: 18    General  appearance: alert, no distress, WD/WN, obese white male  Eyes: PERRLA, EOMi, left conjunctiva injected and some purulent discharge, crusting on the eyelids, no FB, handheld snellen 20/20 corrected each eye, otherwise normal eye exam HEENT: normocephalic,TMs pearly, nares patent, no discharge or erythema, pharynx normal Oral cavity: MMM, no lesions Neck: supple, no lymphadenopathy, no thyromegaly, no masses MSK: tender right AC joint, tender generalized over anterior shoulder, pain with passive and active ROM above 70 degrees, limited ROM. Pulses normal UE Neuro: weak of right UE but due to rotator cuff more likely, strength unable to be fully tested at this time, sensation and DTRs seem normal  Assessment: Encounter Diagnoses  Name Primary?  . Conjunctivitis Yes  . Shoulder pain, right   . Rotator cuff dysfunction   . Insomnia   . Essential hypertension, benign     Plan: Conjunctivitis - script for emycin ointment, discussed proper use, handwashing, prevention, hygiene.  Call/return if not resolving in 1 wk  Shoulder pain, rotator cuff dysfunction - requested ortho records today, script for Robaxin, f/u with ortho today as planned  Insomnia - short  supply of Xanax.  He will need to recheck here with Dr. Susann Givens for ongoing scripts.  HTN - followed by cardiology  Follow-up for physical and routine preventative care.

## 2012-10-06 ENCOUNTER — Telehealth: Payer: Self-pay | Admitting: Internal Medicine

## 2012-10-06 NOTE — Telephone Encounter (Signed)
Pt called stating that his pink eye is no better. And he is out of ointment. The ointment seemed not to work. He is still having all the same symptoms as he did when he came in the other day. He also wants to know if there is an antibiotic you can take by mouth to get rid of this? He would like a call back to see what can be done. Send to [facility] on [address]

## 2012-10-06 NOTE — Telephone Encounter (Signed)
Can he come in today or tomorrow to recheck.  Usually emycin ointment works fine.

## 2012-10-06 NOTE — Telephone Encounter (Signed)
Pt is scheduled for tomorrow to come in

## 2012-10-07 ENCOUNTER — Ambulatory Visit (INDEPENDENT_AMBULATORY_CARE_PROVIDER_SITE_OTHER): Payer: BC Managed Care – PPO | Admitting: Medical

## 2012-10-07 ENCOUNTER — Encounter: Payer: Self-pay | Admitting: Medical

## 2012-10-07 VITALS — BP 110/70 | HR 62 | Temp 98.2°F | Resp 16

## 2012-10-07 DIAGNOSIS — H00016 Hordeolum externum left eye, unspecified eyelid: Secondary | ICD-10-CM

## 2012-10-07 DIAGNOSIS — H109 Unspecified conjunctivitis: Secondary | ICD-10-CM

## 2012-10-07 DIAGNOSIS — H00019 Hordeolum externum unspecified eye, unspecified eyelid: Secondary | ICD-10-CM

## 2012-10-07 MED ORDER — POLYMYXIN B-TRIMETHOPRIM 10000-0.1 UNIT/ML-% OP SOLN: 2.0000 [drp] | OPHTHALMIC | Status: DC

## 2012-10-07 MED ORDER — AZITHROMYCIN 500 MG PO TABS: 500.0000 mg | ORAL_TABLET | Freq: Every day | ORAL | Status: AC

## 2012-10-07 NOTE — Progress Notes (Signed)
Subjective: David [name] is here today for f/u on eye infection.   He has used the erythromycin and still no better.  He notes ongoing left eye watery and purulent drainage, crusted and matted in the morning.  Denies loss of vision, no fever, no ear pain, no sore throat.  Right eye is fine.   No contacts with similar problems recently.  Using nothing for the symptoms.   Past Medical History  Diagnosis Date  . Allergy     RHINITIS  . Hyperlipidemia   . Rosacea   . Smoker   . GERD (gastroesophageal reflux disease)     "not really"  . PAF (paroxysmal atrial fibrillation)     Wears CPAP  . Sleep apnea     CPAP  . Shortness of breath     With activity  . Hypertension     takes Diovan and Toprol  . Arthritis     thumb   Objective: Filed Vitals:   10/07/12 1341  BP: 110/70  Pulse: 62  Temp: 98.2 F (36.8 C)  Resp: 16    General appearance: alert, no distress, WD/WN Eyes: PERRLA, EOMi, left conjunctiva injected and some purulent discharge, left upper inner eyelid with 5mm round mass c/w stye, crusting on the eyelids, no FB, handheld snellen 20/20 corrected each eye, otherwise normal eye exam   Assessment: Encounter Diagnoses  Name Primary?  . Conjunctivitis Yes  . Stye, left     Plan: Conjunctivitis, stye - script for Polytrim drops, azithromycin oral, discussed proper use, handwashing, prevention, hygiene.  If not improving in 2-3 days, f/u with opthalmology.

## 2013-01-29 ENCOUNTER — Other Ambulatory Visit: Payer: Self-pay | Admitting: *Deleted

## 2013-01-29 MED ORDER — METOPROLOL SUCCINATE ER 50 MG PO TB24: 50.0000 mg | ORAL_TABLET | Freq: Every day | ORAL | Status: DC

## 2013-01-29 NOTE — Telephone Encounter (Signed)
Refilled toprol xl to [facility].

## 2013-02-14 ENCOUNTER — Other Ambulatory Visit: Payer: Self-pay | Admitting: Internal Medicine

## 2013-03-06 ENCOUNTER — Other Ambulatory Visit: Payer: Self-pay | Admitting: *Deleted

## 2013-03-06 NOTE — Telephone Encounter (Signed)
Request to refill metoprolol succinate.  Refill completed 6.12.14 w/ 3 refils.  Call to pharmacy and on hold x 6 mins w/o talking w/o anyone.  Call ended.  Faxed refill request back to pharmacy w/ this information.

## 2013-03-14 ENCOUNTER — Encounter: Payer: Self-pay | Admitting: *Deleted

## 2013-03-16 ENCOUNTER — Ambulatory Visit (INDEPENDENT_AMBULATORY_CARE_PROVIDER_SITE_OTHER): Payer: BC Managed Care – PPO | Admitting: Internal Medicine

## 2013-03-16 ENCOUNTER — Encounter: Payer: Self-pay | Admitting: Internal Medicine

## 2013-03-16 VITALS — BP 150/90 | HR 81 | Ht 76.0 in | Wt 373.2 lb

## 2013-03-16 DIAGNOSIS — I4510 Unspecified right bundle-branch block: Secondary | ICD-10-CM

## 2013-03-16 DIAGNOSIS — Z9989 Dependence on other enabling machines and devices: Secondary | ICD-10-CM

## 2013-03-16 DIAGNOSIS — R011 Cardiac murmur, unspecified: Secondary | ICD-10-CM

## 2013-03-16 DIAGNOSIS — I77810 Thoracic aortic ectasia: Secondary | ICD-10-CM

## 2013-03-16 DIAGNOSIS — I4891 Unspecified atrial fibrillation: Secondary | ICD-10-CM

## 2013-03-16 DIAGNOSIS — I83893 Varicose veins of bilateral lower extremities with other complications: Secondary | ICD-10-CM

## 2013-03-16 DIAGNOSIS — I480 Paroxysmal atrial fibrillation: Secondary | ICD-10-CM

## 2013-03-16 DIAGNOSIS — I10 Essential (primary) hypertension: Secondary | ICD-10-CM

## 2013-03-16 DIAGNOSIS — I719 Aortic aneurysm of unspecified site, without rupture: Secondary | ICD-10-CM

## 2013-03-16 DIAGNOSIS — G4733 Obstructive sleep apnea (adult) (pediatric): Secondary | ICD-10-CM

## 2013-03-16 NOTE — Patient Instructions (Addendum)
Your physician wants you to follow-up in: 1 year. You will receive a reminder letter in the mail two months in advance. If you don't receive a letter, please call our office to schedule the follow-up appointment.  Your physician has requested that you have an echocardiogram. Echocardiography is a painless test that uses sound waves to create images of your heart. It provides your doctor with information about the size and shape of your heart and how well your heart's chambers and valves are working. This procedure takes approximately one hour. There are no restrictions for this procedure.  Your physician has requested that you have a lower extremity venous duplex. This test is an ultrasound of the veins in the legs. It looks at venous blood flow that carries blood from the heart to the legs. Allow one hour for a Lower Venous exam. There are no restrictions or special instructions.

## 2013-03-18 ENCOUNTER — Encounter: Payer: Self-pay | Admitting: Internal Medicine

## 2013-03-18 DIAGNOSIS — I480 Paroxysmal atrial fibrillation: Secondary | ICD-10-CM | POA: Insufficient documentation

## 2013-03-18 DIAGNOSIS — I10 Essential (primary) hypertension: Secondary | ICD-10-CM | POA: Insufficient documentation

## 2013-03-18 DIAGNOSIS — G4733 Obstructive sleep apnea (adult) (pediatric): Secondary | ICD-10-CM | POA: Insufficient documentation

## 2013-03-18 DIAGNOSIS — I719 Aortic aneurysm of unspecified site, without rupture: Secondary | ICD-10-CM | POA: Insufficient documentation

## 2013-03-18 DIAGNOSIS — I4510 Unspecified right bundle-branch block: Secondary | ICD-10-CM | POA: Insufficient documentation

## 2013-03-18 NOTE — Progress Notes (Signed)
OFFICE NOTE  Chief Complaint:  Routine followup  Primary Care Physician: David Herter, MD  HPI:  David [name] is a 57 year old gentleman previously followed by Dr. Clarene [name] with a history of AFib remotely in the past; was associated with sleep apnea. Once he went on treatment with CPAP his AFib has since disappeared. He has done fairly well, although continues to smoke. He injured his shoulder recently and had shoulder surgery. Fortunately, he has lost some weight and he is committed to losing even more weight. Symptomatically, he denies any chest pain, shortness of breath, palpitations, presyncope, or syncopal symptoms. His only complaint really is some lower extremity swelling. He does have a chronic right bundle-branch block and had an echocardiogram in the past which demonstrated a mildly dilated aortic root.  PMHx:  Past Medical History  Diagnosis Date  . Allergy     RHINITIS  . Hyperlipidemia   . Rosacea   . Smoker   . GERD (gastroesophageal reflux disease)     "not really"  . PAF (paroxysmal atrial fibrillation)     associated with OSA  . Sleep apnea     CPAP  . Shortness of breath     With activity  . Hypertension     takes Diovan and Toprol  . Arthritis     thumb  . History of echocardiogram 03/29/2009    EF 50-55%; mild concentric LVH;   . History of nuclear stress test 08/28/2007    moderate diaphragmatic attenuation; EKG neg for ischemia   . Systolic murmur   . Right bundle branch block     Past Surgical History  Procedure Laterality Date  . Shoulder closed reduction      Procedure: CLOSED REDUCTION SHOULDER;  Surgeon: David [name];  Location: [facility] OR;  Service: Orthopedics;  Laterality: Right;  . Finger surgery      Reconstruction of Left thumb and L index finger.  Reconstruction   . Tonsillectomy      as a child  . Knee arthroscopy    . Shoulder open rotator cuff repair  08/11/2011    Procedure: ROTATOR CUFF REPAIR SHOULDER OPEN;  Surgeon:  David [name];  Location: [facility] OR;  Service: Orthopedics;  Laterality: Right;    FAMHx:  Family History  Problem Relation Age of Onset  . Cancer - Colon Father   . Cancer Paternal Uncle   . Anesthesia problems Neg Hx   . Cancer - Prostate Paternal Grandfather   . Cancer - Prostate Maternal Grandfather     SOCHx:   reports that he has been smoking Cigarettes.  He has a 30 pack-year smoking history. He has never used smokeless tobacco. He reports that he drinks about 1.0 ounces of alcohol per week. He reports that he does not use illicit drugs.  ALLERGIES:  Allergies  Allergen Reactions  . Codeine Shortness Of Breath and Swelling  . Morphine And Related Shortness Of Breath and Swelling    ROS: A comprehensive review of systems was negative.  HOME MEDS: Current Outpatient Prescriptions  Medication Sig Dispense Refill  . ALPRAZolam (XANAX) 1 MG tablet Take 1 tablet (1 mg total) by mouth at bedtime as needed. sleep  30 tablet  0  . aspirin EC 81 MG tablet Take 81 mg by mouth daily.       . cetirizine-pseudoephedrine (ZYRTEC-D) 5-120 MG per tablet Take 1 tablet by mouth 2 (two) times daily.        . metoprolol succinate (TOPROL-XL) 50  MG 24 hr tablet Take 50 mg by mouth daily. Occasionally takes an extra 25mg       . valsartan-hydrochlorothiazide (DIOVAN-HCT) 160-12.5 MG per tablet TAKE 1 TABLET BY MOUTH EVERY DAY  30 tablet  6   No current facility-administered medications for this visit.    LABS/IMAGING: No results found for this or any previous visit (from the past 48 hour(s)). No results found.  VITALS: BP 150/90  Pulse 81  Ht 6\' 4"  (1.93 m)  Wt 373 lb 3.2 oz (169.282 kg)  BMI 45.45 kg/m2  EXAM: General appearance: alert and no distress Neck: no adenopathy, no carotid bruit, no JVD, supple, symmetrical, trachea midline and thyroid not enlarged, symmetric, no tenderness/mass/nodules Lungs: clear to auscultation bilaterally Heart: regular rate and rhythm and  systolic murmur: early systolic 2/6, crescendo at apex Abdomen: soft, non-tender; bowel sounds normal; no masses,  no organomegaly Extremities: extremities normal, atraumatic, no cyanosis or edema Pulses: 2+ and symmetric Skin: Skin color, texture, turgor normal. No rashes or lesions Neurologic: Grossly normal  EKG: Normal sinus rhythm at 81, right bundle branch block  ASSESSMENT: 1. Paroxysmal atrial fibrillation 2. Obstructive sleep apnea on CPAP 3. Hypertension 4. Systolic murmur 5. Right bundle-branch block 6. Mild aortic root dilatation 7. Morbid obesity  PLAN: 1.   [name] continues to have a mild systolic murmur which is probably MR. He does have a dilated aortic root which is seen in the past and should be reassessed. A right per him to undergo an echocardiogram to evaluate both of these findings. Overall he seems to be doing well otherwise.  David Nose, MD, [facility] Attending Cardiologist [facility]  David [name] 03/18/2013, 5:34 PM

## 2013-03-24 ENCOUNTER — Ambulatory Visit (HOSPITAL_COMMUNITY)
Admission: RE | Admit: 2013-03-24 | Discharge: 2013-03-24 | Disposition: A | Discharge status: Home / Self Care | Payer: BC Managed Care – PPO | Source: Ambulatory Visit | Attending: Cardiovascular Disease | Admitting: Cardiovascular Disease

## 2013-03-24 DIAGNOSIS — I4891 Unspecified atrial fibrillation: Secondary | ICD-10-CM | POA: Insufficient documentation

## 2013-03-24 DIAGNOSIS — I77810 Thoracic aortic ectasia: Secondary | ICD-10-CM

## 2013-03-24 DIAGNOSIS — I517 Cardiomegaly: Secondary | ICD-10-CM | POA: Insufficient documentation

## 2013-03-24 DIAGNOSIS — R011 Cardiac murmur, unspecified: Secondary | ICD-10-CM | POA: Insufficient documentation

## 2013-03-24 DIAGNOSIS — I359 Nonrheumatic aortic valve disorder, unspecified: Secondary | ICD-10-CM | POA: Insufficient documentation

## 2013-03-24 NOTE — Progress Notes (Signed)
2D Echo Performed 03/24/2013    David [name], RCS  

## 2013-03-30 ENCOUNTER — Ambulatory Visit (HOSPITAL_COMMUNITY)
Admission: RE | Admit: 2013-03-30 | Discharge: 2013-03-30 | Disposition: A | Discharge status: Home / Self Care | Payer: BC Managed Care – PPO | Source: Ambulatory Visit | Attending: Cardiovascular Disease | Admitting: Cardiovascular Disease

## 2013-03-30 DIAGNOSIS — M7989 Other specified soft tissue disorders: Secondary | ICD-10-CM

## 2013-03-30 DIAGNOSIS — I83893 Varicose veins of bilateral lower extremities with other complications: Secondary | ICD-10-CM

## 2013-03-30 DIAGNOSIS — I8390 Asymptomatic varicose veins of unspecified lower extremity: Secondary | ICD-10-CM | POA: Insufficient documentation

## 2013-03-30 NOTE — Progress Notes (Signed)
Venous Duplex Lower Ext. Completed. Arsh Feutz, BS, RDMS, RVT  

## 2013-05-12 ENCOUNTER — Emergency Department (HOSPITAL_COMMUNITY): Payer: BC Managed Care – PPO

## 2013-05-12 ENCOUNTER — Emergency Department (HOSPITAL_COMMUNITY)
Admission: EM | Admit: 2013-05-12 | Discharge: 2013-05-12 | Disposition: A | Discharge status: Home / Self Care | Payer: BC Managed Care – PPO | Attending: Emergency Medicine | Admitting: Emergency Medicine

## 2013-05-12 ENCOUNTER — Encounter (HOSPITAL_COMMUNITY): Payer: Self-pay | Admitting: *Deleted

## 2013-05-12 DIAGNOSIS — Z7982 Long term (current) use of aspirin: Secondary | ICD-10-CM | POA: Insufficient documentation

## 2013-05-12 DIAGNOSIS — Z8639 Personal history of other endocrine, nutritional and metabolic disease: Secondary | ICD-10-CM | POA: Insufficient documentation

## 2013-05-12 DIAGNOSIS — I10 Essential (primary) hypertension: Secondary | ICD-10-CM | POA: Insufficient documentation

## 2013-05-12 DIAGNOSIS — F17200 Nicotine dependence, unspecified, uncomplicated: Secondary | ICD-10-CM | POA: Insufficient documentation

## 2013-05-12 DIAGNOSIS — Z79899 Other long term (current) drug therapy: Secondary | ICD-10-CM | POA: Insufficient documentation

## 2013-05-12 DIAGNOSIS — Y929 Unspecified place or not applicable: Secondary | ICD-10-CM | POA: Insufficient documentation

## 2013-05-12 DIAGNOSIS — S82142A Displaced bicondylar fracture of left tibia, initial encounter for closed fracture: Secondary | ICD-10-CM

## 2013-05-12 DIAGNOSIS — R011 Cardiac murmur, unspecified: Secondary | ICD-10-CM | POA: Insufficient documentation

## 2013-05-12 DIAGNOSIS — G4730 Sleep apnea, unspecified: Secondary | ICD-10-CM | POA: Insufficient documentation

## 2013-05-12 DIAGNOSIS — S82109A Unspecified fracture of upper end of unspecified tibia, initial encounter for closed fracture: Secondary | ICD-10-CM | POA: Insufficient documentation

## 2013-05-12 DIAGNOSIS — M19049 Primary osteoarthritis, unspecified hand: Secondary | ICD-10-CM | POA: Insufficient documentation

## 2013-05-12 DIAGNOSIS — I4891 Unspecified atrial fibrillation: Secondary | ICD-10-CM | POA: Insufficient documentation

## 2013-05-12 DIAGNOSIS — W108XXA Fall (on) (from) other stairs and steps, initial encounter: Secondary | ICD-10-CM | POA: Insufficient documentation

## 2013-05-12 DIAGNOSIS — Z8719 Personal history of other diseases of the digestive system: Secondary | ICD-10-CM | POA: Insufficient documentation

## 2013-05-12 DIAGNOSIS — Y9301 Activity, walking, marching and hiking: Secondary | ICD-10-CM | POA: Insufficient documentation

## 2013-05-12 DIAGNOSIS — Z862 Personal history of diseases of the blood and blood-forming organs and certain disorders involving the immune mechanism: Secondary | ICD-10-CM | POA: Insufficient documentation

## 2013-05-12 MED ORDER — OXYCODONE-ACETAMINOPHEN 5-325 MG PO TABS: 1.0000 | ORAL_TABLET | Freq: Four times a day (QID) | ORAL | Status: DC | PRN

## 2013-05-12 MED ORDER — OXYCODONE-ACETAMINOPHEN 5-325 MG PO TABS
2.0000 | ORAL_TABLET | Freq: Once | ORAL | Status: AC
  Administered 2013-05-12: 2 via ORAL
  Filled 2013-05-12: qty 2

## 2013-05-12 NOTE — Progress Notes (Signed)
Orthopedic Tech Progress Note Patient Details:  David [name] 11-08-1955 [id number]  Ortho Devices Type of Ortho Device: Knee Immobilizer;Crutches   David [name] 05/12/2013, 3:47 AM

## 2013-05-12 NOTE — ED Provider Notes (Signed)
CSN: [id number]     Arrival date & time 05/12/13  0024 History   First MD Initiated Contact with Patient 05/12/13 0305     Chief Complaint  Patient presents with  . Knee Pain   (Consider location/radiation/quality/duration/timing/severity/associated sxs/prior Treatment) HPI Comments: Patient states he was walking down steps, and missed one, falling to his left knee, approximately 6 hours prior to arrival in the emergency department.  Has significant swelling.  Slight discoloration.  Denies any numbness or tingling to the foot.  Denies other injury  Patient is a 57 y.o. male presenting with knee pain. The history is provided by the patient.  Knee Pain Location:  Knee Knee location:  L knee Pain details:    Quality:  Aching   Radiates to:  Does not radiate   Severity:  Moderate   Onset quality:  Sudden   Timing:  Constant   Progression:  Worsening Chronicity:  New Dislocation: no   Foreign body present:  No foreign bodies Tetanus status:  Unknown Prior injury to area:  No Relieved by:  None tried Worsened by:  Activity Ineffective treatments:  None tried Associated symptoms: decreased ROM and swelling   Associated symptoms: no back pain and no fever     Past Medical History  Diagnosis Date  . Allergy     RHINITIS  . Hyperlipidemia   . Rosacea   . Smoker   . GERD (gastroesophageal reflux disease)     "not really"  . PAF (paroxysmal atrial fibrillation)     associated with OSA  . Sleep apnea     CPAP  . Shortness of breath     With activity  . Hypertension     takes Diovan and Toprol  . Arthritis     thumb  . History of echocardiogram 03/29/2009    EF 50-55%; mild concentric LVH;   . History of nuclear stress test 08/28/2007    moderate diaphragmatic attenuation; EKG neg for ischemia   . Systolic murmur   . Right bundle branch block    Past Surgical History  Procedure Laterality Date  . Shoulder closed reduction      Procedure: CLOSED REDUCTION SHOULDER;   Surgeon: [facility];  Location: [facility] OR;  Service: Orthopedics;  Laterality: Right;  . Finger surgery      Reconstruction of Left thumb and L index finger.  Reconstruction   . Tonsillectomy      as a child  . Knee arthroscopy    . Shoulder open rotator cuff repair  08/11/2011    Procedure: ROTATOR CUFF REPAIR SHOULDER OPEN;  Surgeon: Oletta Cohn III;  Location: [facility] OR;  Service: Orthopedics;  Laterality: Right;   Family History  Problem Relation Age of Onset  . Cancer - Colon Father   . Cancer Paternal Uncle   . Anesthesia problems Neg Hx   . Cancer - Prostate Paternal Grandfather   . Cancer - Prostate Maternal Grandfather    History  Substance Use Topics  . Smoking status: Current Every Day Smoker -- 1.00 packs/day for 30 years    Types: Cigarettes  . Smokeless tobacco: Never Used  . Alcohol Use: 1 - 1.5 oz/week    2-3 drink(s) per week    Review of Systems  Unable to perform ROS Constitutional: Negative for fever.  Musculoskeletal: Positive for joint swelling. Negative for back pain.  Skin: Negative for wound.  All other systems reviewed and are negative.    Allergies  Codeine and Morphine  and related  Home Medications   Current Outpatient Rx  Name  Route  Sig  Dispense  Refill  . ALPRAZolam (XANAX) 1 MG tablet   Oral   Take 1 tablet (1 mg total) by mouth at bedtime as needed. sleep   30 tablet   0   . aspirin EC 81 MG tablet   Oral   Take 81 mg by mouth daily.          . cetirizine-pseudoephedrine (ZYRTEC-D) 5-120 MG per tablet   Oral   Take 1 tablet by mouth 2 (two) times daily.           . metoprolol succinate (TOPROL-XL) 50 MG 24 hr tablet   Oral   Take 50 mg by mouth daily. Occasionally takes an extra 25mg          . naproxen sodium (ANAPROX) 220 MG tablet   Oral   Take 440 mg by mouth as needed (pain).         . valsartan-hydrochlorothiazide (DIOVAN-HCT) 160-12.5 MG per tablet   Oral   Take 1 tablet by mouth daily.           Marland Kitchen oxyCODONE-acetaminophen (PERCOCET/ROXICET) 5-325 MG per tablet   Oral   Take 1 tablet by mouth every 6 (six) hours as needed for pain.   30 tablet   0    BP 111/70  Pulse 86  Temp(Src) 99.2 F (37.3 C) (Oral)  Resp 18  SpO2 96% Physical Exam  Nursing note and vitals reviewed. Constitutional: He appears well-developed and well-nourished.  HENT:  Head: Normocephalic.  Eyes: Pupils are equal, round, and reactive to light.  Neck: Normal range of motion.  Cardiovascular: Normal rate and regular rhythm.   Pulmonary/Chest: Effort normal and breath sounds normal.  Musculoskeletal: He exhibits tenderness.       Left knee: He exhibits decreased range of motion, swelling and ecchymosis. He exhibits no erythema, normal alignment and no LCL laxity. Tenderness found.       Legs: Neurological: He is alert.  Skin: Skin is warm and dry. No erythema.    ED Course  Procedures (including critical care time) Labs Review Labs Reviewed - No data to display Imaging Review Dg Knee Complete 4 Views Left  05/12/2013   CLINICAL DATA:  Status post fall with twisting injury. Left knee pain.  EXAM: LEFT KNEE - COMPLETE 4+ VIEW  COMPARISON:  None.  FINDINGS: The patient has a nondisplaced fracture of the lateral tibial plateau. Advanced for age tricompartmental osteoarthritis is present and appears worst in the lateral compartment. There is a small joint effusion.  IMPRESSION: Nondisplaced lateral tibial plateau fracture.  Advanced for age tricompartmental degenerative change, worse in the lateral compartment.   Electronically Signed   By: Drusilla Kanner M.D.   On: 05/12/2013 01:30    MDM   1. Tibial plateau fracture, left, closed, initial encounter     Patient has been placed in a knee immobilizer treated with.  Crutches given Percocet for pain.  He has a relationship with [facility] orthopedics.  At this time.  Will followup with them    Arman Filter, NP 05/12/13 [id number]

## 2013-05-12 NOTE — ED Provider Notes (Signed)
Medical screening examination/treatment/procedure(s) were performed by non-physician practitioner and as supervising physician I was immediately available for consultation/collaboration.  Dashan Chizmar, MD 05/12/13 0605 

## 2013-05-12 NOTE — ED Notes (Signed)
Pt reports missing a step tonight and twisting his left knee. Knee presents with swelling and tenderness, able to bear minimal weight, no deformity noted. Pt denies LOC, hitting head, dizziness

## 2013-06-12 ENCOUNTER — Other Ambulatory Visit: Payer: Self-pay | Admitting: Internal Medicine

## 2013-06-12 NOTE — Telephone Encounter (Signed)
Rx was sent to pharmacy electronically. 

## 2013-06-30 ENCOUNTER — Telehealth: Payer: Self-pay | Admitting: Medical

## 2013-06-30 NOTE — Telephone Encounter (Signed)
I thought he was seeing ortho for the shoulder

## 2013-07-01 ENCOUNTER — Telehealth: Payer: Self-pay | Admitting: Family Medicine

## 2013-07-01 NOTE — Telephone Encounter (Signed)
Patient states that he is  No longer seeing ortho. Anymore. He said that at his last visit that you said you didn't have a problem refilling his Robaxin or his Xanax. CLS

## 2013-07-01 NOTE — Telephone Encounter (Signed)
I called and I left the patient a message on his voicemail. CLS

## 2013-07-01 NOTE — Telephone Encounter (Signed)
Get ortho notes, have him return for recheck

## 2013-07-02 ENCOUNTER — Telehealth: Payer: Self-pay | Admitting: Family Medicine

## 2013-07-02 NOTE — Telephone Encounter (Signed)
Pt called wanted to know why we would not fill his meds since he has been coming here for over 30 years.  We did not see him at all in 2013 and only twice in 2014 for pink eye.  Yet meds are being filled.  Pt has appt for med check with [facility] tomorrow.

## 2013-07-03 ENCOUNTER — Ambulatory Visit (INDEPENDENT_AMBULATORY_CARE_PROVIDER_SITE_OTHER): Payer: BC Managed Care – PPO | Admitting: Medical

## 2013-07-03 ENCOUNTER — Encounter: Payer: Self-pay | Admitting: Medical

## 2013-07-03 VITALS — BP 130/80 | HR 60 | Temp 98.1°F | Resp 16 | Wt 354.0 lb

## 2013-07-03 DIAGNOSIS — M25511 Pain in right shoulder: Secondary | ICD-10-CM

## 2013-07-03 DIAGNOSIS — M62838 Other muscle spasm: Secondary | ICD-10-CM

## 2013-07-03 DIAGNOSIS — Z23 Encounter for immunization: Secondary | ICD-10-CM

## 2013-07-03 DIAGNOSIS — G4700 Insomnia, unspecified: Secondary | ICD-10-CM

## 2013-07-03 DIAGNOSIS — M25519 Pain in unspecified shoulder: Secondary | ICD-10-CM

## 2013-07-03 MED ORDER — ALPRAZOLAM 1 MG PO TABS: 1.0000 mg | ORAL_TABLET | Freq: Every evening | ORAL | Status: DC | PRN

## 2013-07-03 MED ORDER — METHOCARBAMOL 500 MG PO TABS: 500.0000 mg | ORAL_TABLET | Freq: Three times a day (TID) | ORAL | Status: DC | PRN

## 2013-07-03 NOTE — Progress Notes (Signed)
Subjective: Here for recheck on shoulder and insomnia.  Saw me in 09/2012 for unrelated issues and shoulder pain.  At that time was being followed by ortho/Dr. Amanda Pea who recommended rotator cuff surgery/revision.  He didn't want to pursue this, so he decided to just use medication and deal with the pain.  He gets intermittent spasm of shoulder muscles, aches at times, still has somewhat limited ROM.  Sometimes shoulder bothers him, sometimes not.   Uses robaxin intermittently.  Would like a refill on the medication.   Not currently doing home exercises program  He has insomnia.  Has trouble getting to sleep at times .  Lately his stressors include mother who is elderly in a nursing home in [place], [place] , not doing so well.   He gets very emotional about this,  Uses Xanax to help him sleep when times are tough.  He declines antidepressant, doesn't want to take a medication daily for mood.    Due for recheck on chronic issues, but not fasting today.  Past Medical History  Diagnosis Date  . Allergy     RHINITIS  . Hyperlipidemia   . Rosacea   . Smoker   . GERD (gastroesophageal reflux disease)     "not really"  . PAF (paroxysmal atrial fibrillation)     associated with OSA  . Sleep apnea     CPAP  . Shortness of breath     With activity  . Hypertension     takes Diovan and Toprol  . Arthritis     thumb  . History of echocardiogram 03/29/2009    EF 50-55%; mild concentric LVH;   . History of nuclear stress test 08/28/2007    moderate diaphragmatic attenuation; EKG neg for ischemia   . Systolic murmur   . Right bundle branch block    ROS as in subjective  Objective: Filed Vitals:   07/03/13 1608  BP: 130/80  Pulse: 60  Temp: 98.1 F (36.7 C)  Resp: 16    General appearance: alert, no distress, WD/WN  Neck: nontender, normal ROM MSK: mild tenderness right upper arm generalized, pain with shoulder flexion and abduction over 80 degrees, pain with apprehension, empty can and  drop arm.  ROM limited due to pain.  Rest of UE unremarkable UE pulses normal Neuro: right arm seems generally weak but likely just due to pain, otherwise neuro intact of UE Psych: pleasant, almost tearful at times   Assessment: Encounter Diagnoses  Name Primary?  . Muscle spasm of right shoulder Yes  . Pain in joint, shoulder region, right   . Insomnia   . Need for prophylactic vaccination and inoculation against influenza      Plan: Muscle spasm, pain in shoulder - advised he use home exercise program that ortho gave him, Robaxin prn Insomnia - discussed sleep concerns, sleep hygiene, use Xanax prn. Counseled on the influenza virus vaccine.  Vaccine information sheet given.  Influenza vaccine given after consent obtained. Follow-up with Dr. Susann Givens for routine f/u soon, fasting.

## 2013-07-03 NOTE — Telephone Encounter (Signed)
Patient is coming in for a office visit today and I called over to [facility]. To get copies of last OV notes. CLS

## 2013-11-26 ENCOUNTER — Other Ambulatory Visit: Payer: Self-pay | Admitting: Internal Medicine

## 2013-11-27 NOTE — Telephone Encounter (Signed)
Rx was sent to pharmacy electronically. 

## 2014-02-27 ENCOUNTER — Other Ambulatory Visit: Payer: Self-pay | Admitting: Internal Medicine

## 2014-03-01 NOTE — Telephone Encounter (Signed)
Rx was sent to pharmacy electronically. 

## 2014-03-19 ENCOUNTER — Other Ambulatory Visit: Payer: Self-pay | Admitting: Internal Medicine

## 2014-03-19 NOTE — Telephone Encounter (Signed)
Rx was sent to pharmacy electronically. 

## 2014-04-22 ENCOUNTER — Other Ambulatory Visit: Payer: Self-pay | Admitting: Internal Medicine

## 2014-05-06 ENCOUNTER — Other Ambulatory Visit: Payer: Self-pay | Admitting: Cardiology

## 2014-05-07 NOTE — Telephone Encounter (Signed)
Rx was sent to pharmacy electronically. 

## 2014-05-30 ENCOUNTER — Other Ambulatory Visit: Payer: Self-pay | Admitting: Internal Medicine

## 2014-05-31 NOTE — Telephone Encounter (Signed)
Rx was sent to pharmacy electronically. 

## 2014-06-18 ENCOUNTER — Telehealth: Payer: Self-pay | Admitting: Internal Medicine

## 2014-06-18 MED ORDER — VALSARTAN-HYDROCHLOROTHIAZIDE 160-12.5 MG PO TABS: 1.0000 | ORAL_TABLET | Freq: Every day | ORAL | Status: DC

## 2014-06-18 MED ORDER — METOPROLOL SUCCINATE ER 50 MG PO TB24: 50.0000 mg | ORAL_TABLET | Freq: Every day | ORAL | Status: DC

## 2014-06-18 NOTE — Telephone Encounter (Signed)
Spoke with pt, aware refills sent to the pharm

## 2014-06-18 NOTE — Telephone Encounter (Signed)
Pt called in stating that he is out of his Metoprolol and Valsartan and would like it called in to the [facility] on [address] and [address].  Thanks

## 2014-07-06 ENCOUNTER — Encounter: Payer: Self-pay | Admitting: Internal Medicine

## 2014-07-06 ENCOUNTER — Ambulatory Visit (INDEPENDENT_AMBULATORY_CARE_PROVIDER_SITE_OTHER): Payer: BC Managed Care – PPO | Admitting: Internal Medicine

## 2014-07-06 VITALS — BP 124/72 | HR 76 | Ht 75.0 in | Wt 386.3 lb

## 2014-07-06 DIAGNOSIS — I10 Essential (primary) hypertension: Secondary | ICD-10-CM

## 2014-07-06 DIAGNOSIS — I719 Aortic aneurysm of unspecified site, without rupture: Secondary | ICD-10-CM

## 2014-07-06 DIAGNOSIS — Z9989 Dependence on other enabling machines and devices: Secondary | ICD-10-CM

## 2014-07-06 DIAGNOSIS — I83892 Varicose veins of left lower extremities with other complications: Secondary | ICD-10-CM

## 2014-07-06 DIAGNOSIS — I4510 Unspecified right bundle-branch block: Secondary | ICD-10-CM

## 2014-07-06 DIAGNOSIS — I480 Paroxysmal atrial fibrillation: Secondary | ICD-10-CM

## 2014-07-06 DIAGNOSIS — G4733 Obstructive sleep apnea (adult) (pediatric): Secondary | ICD-10-CM

## 2014-07-06 MED ORDER — METOPROLOL SUCCINATE ER 50 MG PO TB24: 50.0000 mg | ORAL_TABLET | Freq: Every day | ORAL | Status: DC

## 2014-07-06 NOTE — Progress Notes (Signed)
OFFICE NOTE  Chief Complaint:  Routine followup, BP has been spiking  Primary Care Physician: David Haste, MD  HPI:  David [name] is a 58 year old gentleman previously followed by Dr. Rex [name] with a history of AFib remotely in the past; was associated with sleep apnea. Once he went on treatment with CPAP his AFib has since disappeared. He has done fairly well, although continues to smoke. He injured his shoulder recently and had shoulder surgery. He follows up in the office today and reports that his blood pressure is been spiking recently. He's had a take an extra half of Toprol to help with his blood pressure in the afternoon. He has also gained about 20 pounds of weight back. He says that his sleep apnea mask is malfunctioning and that he feels he needs new equipment. His sleep study was in 2010 therefore he is well overdue for a repeat sleep apneas assessment. He has a stable right bundle branch block. There is no evidence for recurrent atrial fibrillation. He occasionally gets palpitations however they're short-lived and he says that they're nothing like the A. Fib he had before. When I previously saw him we had ordered a venous insufficiency study of the left leg due to swelling and venous stasis changes. This demonstrated reflux in the left greater saphenous vein which is significant and could be contributing to his symptoms.  PMHx:  Past Medical History  Diagnosis Date  . Allergy     RHINITIS  . Hyperlipidemia   . Rosacea   . Smoker   . GERD (gastroesophageal reflux disease)     "not really"  . PAF (paroxysmal atrial fibrillation)     associated with OSA  . Sleep apnea     CPAP  . Shortness of breath     With activity  . Hypertension     takes Diovan and Toprol  . Arthritis     thumb  . History of echocardiogram 03/29/2009    EF 50-55%; mild concentric LVH;   . History of nuclear stress test 08/28/2007    moderate diaphragmatic attenuation; EKG neg for  ischemia   . Systolic murmur   . Right bundle branch block     Past Surgical History  Procedure Laterality Date  . Shoulder closed reduction      Procedure: CLOSED REDUCTION SHOULDER;  Surgeon: David [name] [facility];  Location: [place];  Service: Orthopedics;  Laterality: Right;  . Finger surgery      Reconstruction of Left thumb and L index finger.  Reconstruction   . Tonsillectomy      as a child  . Knee arthroscopy    . Shoulder open rotator cuff repair  08/11/2011    Procedure: ROTATOR CUFF REPAIR SHOULDER OPEN;  Surgeon: Willa Frater III;  Location: [place];  Service: Orthopedics;  Laterality: Right;    FAMHx:  Family History  Problem Relation Age of Onset  . Cancer - Colon Father   . Cancer Paternal Uncle   . Anesthesia problems Neg Hx   . Cancer - Prostate Paternal Grandfather   . Cancer - Prostate Maternal Grandfather     SOCHx:   reports that he has been smoking Cigarettes.  He has a 30 pack-year smoking history. He has never used smokeless tobacco. He reports that he drinks about 1.0 - 1.5 oz of alcohol per week. He reports that he does not use illicit drugs.  ALLERGIES:  Allergies  Allergen Reactions  . Codeine Shortness Of Breath and Swelling  .  Morphine And Related Shortness Of Breath and Swelling    ROS: A comprehensive review of systems was negative except for: Cardiovascular: positive for lower extremity edema  HOME MEDS: Current Outpatient Prescriptions  Medication Sig Dispense Refill  . ALPRAZolam (XANAX) 1 MG tablet Take 1 tablet (1 mg total) by mouth at bedtime as needed. sleep 30 tablet 1  . aspirin EC 81 MG tablet Take 81 mg by mouth daily.     . cetirizine-pseudoephedrine (ZYRTEC-D) 5-120 MG per tablet Take 1 tablet by mouth at bedtime.     . methocarbamol (ROBAXIN) 500 MG tablet Take 1 tablet (500 mg total) by mouth every 8 (eight) hours as needed for muscle spasms. 45 tablet 1  . metoprolol succinate (TOPROL-XL) 50 MG 24 hr tablet Take 1 tablet  (50 mg total) by mouth daily. May take an extra 25mg  daily as needed. 60 tablet 11  . valsartan-hydrochlorothiazide (DIOVAN-HCT) 160-12.5 MG per tablet Take 1 tablet by mouth daily.     No current facility-administered medications for this visit.    LABS/IMAGING: No results found for this or any previous visit (from the past 48 hour(s)). No results found.  VITALS: BP 124/72 mmHg  Pulse 76  Ht 6\' 3"  (1.905 m)  Wt 386 lb 4.8 oz (175.225 kg)  BMI 48.28 kg/m2  EXAM: General appearance: alert and no distress Neck: no adenopathy, no carotid bruit, no JVD, supple, symmetrical, trachea midline and thyroid not enlarged, symmetric, no tenderness/mass/nodules Lungs: clear to auscultation bilaterally Heart: regular rate and rhythm and systolic murmur: early systolic 2/6, crescendo at apex Abdomen: soft, non-tender; bowel sounds normal; no masses,  no organomegaly Extremities: edema 1+ LLE, varicose veins noted and venous stasis dermatitis noted Pulses: 2+ and symmetric Skin: Skin color, texture, turgor normal. No rashes or lesions Neurologic: Grossly normal  EKG: Normal sinus rhythm at 81, right bundle branch block  ASSESSMENT: 1. Paroxysmal atrial fibrillation 2. Obstructive sleep apnea on CPAP 3. Hypertension 4. Mild aortic stenosis/regurgitation, EF 60-65% 5. Right bundle-branch block 6. Mild aortic root dilatation 7. Morbid obesity 8. Symptomatic reflux of the left greater saphenous vein  PLAN: 1.   [name] has not had recurrence of A. Fib. He has had weight gain and may not be getting the benefit of his CPAP as the device is malfunctioning. He is desiring new equipment and I would recommend a repeat sleep study given his weight gain. He's also had some uncontrolled hypertension recently which could be due to either. I'm okay with him taking Toprol-XL 50 g in the morning and an extra 25 mg in the afternoon as needed. Right bundle branch block is been stable and his heart rate is in  the 70s. His echo recently showed an EF of 60-65% with mild aortic stenosis and regurgitation. There is mild aortic root dilatation up to 4.1 cm. A venous insufficiency study showed symptomatic reflux of the left greater saphenous vein. I discussed the possibility of vein ablation and he is willing to consider it. If he should be interested in it further I would recommend a referral to Dr. Donnetta Hutching with [facility].  [name], MD, [facility] Attending Cardiologist [facility]  Francoise Chojnowski C 07/06/2014, 5:05 PM

## 2014-07-06 NOTE — Patient Instructions (Signed)
Your physician wants you to follow-up in: 1 year with Dr. Debara Pickett. You will receive a reminder letter in the mail two months in advance. If you don't receive a letter, please call our office to schedule the follow-up appointment.  We have refilled your metoprolol succinate. Take 50mg  (1 tablet) by mouth daily. You may take an extra 1/2 tablet (25mg ) daily as needed.   Your physician has recommended that you have a sleep study. This test records several body functions during sleep, including: brain activity, eye movement, oxygen and carbon dioxide blood levels, heart rate and rhythm, breathing rate and rhythm, the flow of air through your mouth and nose, snoring, body muscle movements, and chest and belly movement. >> this is done at [facility]

## 2014-07-13 ENCOUNTER — Other Ambulatory Visit: Payer: Self-pay | Admitting: Internal Medicine

## 2014-07-13 NOTE — Telephone Encounter (Signed)
Rx refill sent to patient pharmacy   

## 2014-09-22 ENCOUNTER — Ambulatory Visit (HOSPITAL_BASED_OUTPATIENT_CLINIC_OR_DEPARTMENT_OTHER): Payer: BC Managed Care – PPO | Attending: Internal Medicine | Admitting: *Deleted

## 2014-09-22 VITALS — Ht 76.0 in | Wt 350.0 lb

## 2014-09-22 DIAGNOSIS — G4733 Obstructive sleep apnea (adult) (pediatric): Secondary | ICD-10-CM | POA: Insufficient documentation

## 2014-10-16 NOTE — Sleep Study (Signed)
   NAME: David [name] DATE OF BIRTH:  1956-03-30 MEDICAL RECORD NUMBER [id number]  LOCATION: [facility]  PHYSICIAN: KELLY,THOMAS A  DATE OF STUDY: 09/22/2014  SLEEP STUDY TYPE: Nocturnal Polysomnogram               REFERRING PHYSICIAN: [name]., MD  INDICATION FOR STUDY:  [name] is a 59 year old patient of Dr. Debara Pickett who has a history of CPAP has been on BiPAP therapy.  He admits to loud snoring, leg movements, daytime fatigue, and frequent awakenings.  He was referred for a split-night protocol and repeat titration.  EPWORTH SLEEPINESS SCORE:  8 HEIGHT: 6\' 4"  (193 cm)  WEIGHT: (!) 350 lb (158.759 kg)    Body mass index is 42.62 kg/(m^2).  NECK SIZE: 21 in.  MEDICATIONS: valsartan-hydrochlorothiazide (DIOVAN-HCT) 160-12.5 MG per tablet 1 tablet, Daily valsartan-hydrochlorothiazide (DIOVAN-HCT) 160-12.5 MG per tablet 1 tablet, Daily metoprolol succinate (TOPROL-XL) 50 MG 24 hr tablet 50 mg, Daily methocarbamol (ROBAXIN) 500 MG tablet 500 mg, Every 8 hours PRN cetirizine-pseudoephedrine (ZYRTEC-D) 5-120 MG per tablet 1 tablet, Daily at bedtime aspirin EC 81 MG tablet 81 mg, Daily ALPRAZolam (XANAX) 1 MG tablet 1 mg, At bedtime PRN   SLEEP ARCHITECTURE: Total sleep time was 133.5 minutes out of a sleep period of time, a 378 minutes giving a percent sleep efficiency reduced at 34.8%.  Sleep latency was 5.5 minutes.  REM sleep was not achieved.  The patient spent 109 minutes in stage I (81.6% and 24.5 minutes in stage II (18.4%.  He did not have stage III or REM sleep.  Supine sleep was 48.3% and nonsupine sleep, 51.7%.  The arousal index was severely elevated at 56.2.  Due to inadequate sleep time, CPAP/BiPAP titration was not able to be performed.  RESPIRATORY DATA:  During the sleep period of time, the patient had 107 obstructive apneas, 0 central apneas, 0 mixed apneas.  There were 36, hypotony is.  His apnea hypopnea index (AHI) was elevated at 64.3/hr, which  is consistent with severe obstructive sleep apnea.  There was moderate snoring.  OXYGEN DATA:  The baseline oxygen saturation was 94%.  The lowest oxygen saturation with non-REM sleep was 86%.  CARDIAC DATA:  The patient was in sinus rhythm.  His average heart rate was 70 bpm.  MOVEMENT/PARASOMNIA:  There were 0 periodic limb movements throughout the night.  IMPRESSION/ RECOMMENDATION:   Severe obstructive sleep apnea/hypopnea syndrome. Respiratory events with oxygen desaturation to a nadir of 86% with non-REM sleep. Absence of REM sleep. Moderate snoring. No evidence for nocturnal myoclonus. The arousal index was severely abnormal.  CPAP/BiPAP titration is recommended in light of the severity of the patient's sleep apnea/hypopnea  syndrome and its symptomatic nature. Efforts should be made to optimize nasal and oral pharyngeal patency.  With a body mass index of 43, the patient should be counseled in both good sleep hygiene procedures as well as weight loss. Proceed with expeditious CPAP/BiPAP titration.   [place], American Board of Sleep Medicine  ELECTRONICALLY SIGNED ON:  10/16/2014, 3:42 PM [place] PH: [phone]   FX: [phone] [place]

## 2014-10-16 NOTE — Addendum Note (Signed)
Addended by: Shelva Majestic A on: 10/16/2014 04:02 PM   Modules accepted: Level of Service

## 2014-10-21 ENCOUNTER — Other Ambulatory Visit: Payer: Self-pay | Admitting: *Deleted

## 2014-10-21 DIAGNOSIS — G4733 Obstructive sleep apnea (adult) (pediatric): Secondary | ICD-10-CM

## 2014-10-29 ENCOUNTER — Telehealth: Payer: Self-pay | Admitting: *Deleted

## 2014-10-29 NOTE — Telephone Encounter (Signed)
-----   Message from Troy Sine, MD sent at 10/16/2014  4:01 PM EST ----- Mariann Laster set up for CPAP/BiPAP titration

## 2014-11-01 ENCOUNTER — Encounter: Payer: Self-pay | Admitting: Cardiovascular Disease

## 2014-11-11 ENCOUNTER — Telehealth: Payer: Self-pay | Admitting: *Deleted

## 2014-11-11 NOTE — Telephone Encounter (Signed)
Faxed order for CPAP machine and supplies to [facility].

## 2014-11-12 ENCOUNTER — Ambulatory Visit (HOSPITAL_BASED_OUTPATIENT_CLINIC_OR_DEPARTMENT_OTHER): Payer: BC Managed Care – PPO | Attending: Cardiovascular Disease

## 2014-11-12 VITALS — Ht 76.0 in | Wt 350.0 lb

## 2014-11-12 DIAGNOSIS — G4733 Obstructive sleep apnea (adult) (pediatric): Secondary | ICD-10-CM | POA: Diagnosis present

## 2014-11-12 DIAGNOSIS — Z9989 Dependence on other enabling machines and devices: Secondary | ICD-10-CM

## 2014-11-28 NOTE — Sleep Study (Signed)
   NAME: David [name] DATE OF BIRTH:  06/11/1956 MEDICAL RECORD NUMBER [id number]  LOCATION: [facility]  PHYSICIAN: Lon Klippel A  DATE OF STUDY: 11/12/2014  SLEEP STUDY TYPE: Bi-Level Titration               REFERRING PHYSICIAN: Troy Sine, MD  INDICATION FOR STUDY:  [name] is a 59 year old male who has been on BiPAP therapy at home and recently has been having difficulty with his machine.  He has gained weight.  He has a history of hypertension and paroxysmal atrial fibrillation.  He is referred for BiPAP titration.  EPWORTH SLEEPINESS SCORE:   HEIGHT: 6\' 4"  (193 cm)  WEIGHT: (!) 350 lb (158.759 kg)    Body mass index is 42.62 kg/(m^2).  NECK SIZE: 21 in.  MEDICATIONS:   valsartan-hydrochlorothiazide (DIOVAN-HCT) 160-12.5 MG per tablet 1 tablet, Daily valsartan-hydrochlorothiazide (DIOVAN-HCT) 160-12.5 MG per tablet 1 tablet, Daily metoprolol succinate (TOPROL-XL) 50 MG 24 hr tablet 50 mg, Daily methocarbamol (ROBAXIN) 500 MG tablet 500 mg, Every 8 hours PRN cetirizine-pseudoephedrine (ZYRTEC-D) 5-120 MG per tablet 1 tablet, Daily at bedtime aspirin EC 81 MG tablet 81 mg, Daily ALPRAZolam (XANAX) 1 MG tablet   SLEEP ARCHITECTURE:   Total recording time was 361 minutes.  The patient slept for 278 minutes out of a sleep period of time of 351.5 minutes.  Percent sleep efficiency is 77%.  He spent 35 minutes (12.6%) in stage I, 216.5 minutes(77.9%) in stage II, 0 minutes in stage III and 26.5 minutes(9.5%) in REMsleep.  Sleep latency was 9.5 minutes.  Latency from sleep was prolonged at 204.5 minutes.  There were 32 arousals with an index of 8.2.  RESPIRATORY DATA:  BiPAP was initiated at 8/4 and was titrated up to 20/16 cm water pressure.The AHI was 0 at 20/16.   Initially there was loud snoring, which resolved with BiPAP at optimal pressure.     OXYGEN DATA:  The baseline oxygen saturation was 92%.  The lowest oxygen desaturation was 88% with non-REM sleep  and 94% with Rehm sleep.  CARDIAC DATA:  The patient was in sinus rhythm.  There was a rare PVC.  MOVEMENT/PARASOMNIA:  There were frequent periodic movements with a total count of 166 and an index of 35.8.  PLM to arousal was 0.  IMPRESSION/ RECOMMENDATION:   Recommend BiPAP pressure at 20/16 with Bi-Flex/EPR of 3 with heated humidification.  The patient tolerated this pressure well with resolution of loud snoring.  Recommend a download be obtained in 30 days.  If patient is symptomatic with restless legs, consider a trial of medical therapy.  Recommend sleep clinic evaluation.   [place], American Board of Sleep Medicine  ELECTRONICALLY SIGNED ON:  11/28/2014, 4:08 PM [place] PH: [phone]   FX: [phone] [place]

## 2014-11-28 NOTE — Addendum Note (Signed)
Addended by: Shelva Majestic A on: 11/28/2014 04:23 PM   Modules accepted: Level of Service

## 2014-12-03 ENCOUNTER — Telehealth: Payer: Self-pay | Admitting: *Deleted

## 2014-12-03 NOTE — Telephone Encounter (Signed)
Spoke with patient to inform him that I would be sending his BIPAP order to [facility]. He informed me that he has already been set up. Per sharon @ [facility] he was set up on a new machine on 11/26/14.

## 2014-12-06 ENCOUNTER — Encounter: Payer: Self-pay | Admitting: Cardiovascular Disease

## 2014-12-22 ENCOUNTER — Encounter (HOSPITAL_BASED_OUTPATIENT_CLINIC_OR_DEPARTMENT_OTHER): Payer: BC Managed Care – PPO

## 2015-01-10 ENCOUNTER — Ambulatory Visit (INDEPENDENT_AMBULATORY_CARE_PROVIDER_SITE_OTHER): Payer: BC Managed Care – PPO | Admitting: Cardiovascular Disease

## 2015-01-10 VITALS — BP 145/81 | HR 86 | Ht 76.0 in | Wt >= 6400 oz

## 2015-01-10 DIAGNOSIS — G4733 Obstructive sleep apnea (adult) (pediatric): Secondary | ICD-10-CM

## 2015-01-10 DIAGNOSIS — I10 Essential (primary) hypertension: Secondary | ICD-10-CM | POA: Diagnosis not present

## 2015-01-10 DIAGNOSIS — I83892 Varicose veins of left lower extremities with other complications: Secondary | ICD-10-CM | POA: Diagnosis not present

## 2015-01-10 DIAGNOSIS — Z9989 Dependence on other enabling machines and devices: Secondary | ICD-10-CM

## 2015-01-10 MED ORDER — HYDROCHLOROTHIAZIDE 12.5 MG PO CAPS: 12.5000 mg | ORAL_CAPSULE | Freq: Every day | ORAL | Status: DC

## 2015-01-10 MED ORDER — VALSARTAN-HYDROCHLOROTHIAZIDE 160-25 MG PO TABS: 1.0000 | ORAL_TABLET | Freq: Every day | ORAL | Status: DC

## 2015-01-10 NOTE — Patient Instructions (Signed)
Your physician has recommended you make the following change in your medication: the valsartan-HCT has been changed to 160-25mg . A new prescription has been provided to you today. I have sent a prescription to the pharmacy for the HCTZ 12.5 MG so that you may complete your old prescription. Once completed start the new prescription.   Your physician wants you to follow-up in:in 1 year or sooner if needed with Dr. Claiborne Billings for sleep.  You will receive a reminder letter in the mail two months in advance. If you don't receive a letter, please call our office to schedule the follow-up appointment.

## 2015-01-12 ENCOUNTER — Encounter: Payer: Self-pay | Admitting: Cardiovascular Disease

## 2015-01-12 NOTE — Progress Notes (Signed)
Patient ID: David [name], male   DOB: [birth year], 59 y.o.   MRN: [id number]     HPI: David [name], is a 59 y.o. male was followed by Dr. Debara [name].  He recently underwent a BiPAP titration for severe sleep apnea who presents to sleep clinic for follow-up evaluation.  David [name] is a former patient of Dr. Rex [name].  Recently has been followed by Dr. Debara [name] and has a history of paroxysmal atrial fibrillation, hypertension, mild aortic stenosis/regurgitation with an ejection fraction of 60-65%, right bundle branch block, morbid obesity, symptomatic varicose veins, and he has a previous diagnosis of sleep apnea.  He was initially referred for sleep study in September 2010 by Dr. Rex [name] and was found to have severe sleep apnea with an overall AHI of 99.3/h and during grams sleep at 1 and 12.5 per hour.  He had loud snoring.  He dropped his oxygen saturation to 77%.  On 06/20/2009 he underwent titration and ultimately required BiPAP and initially was set at a Pap minimum of 10 with an IPAP maximum of 25 and a delta of 4 cm.  His machine was old.  He recently was having difficulty with his sleep.  He was referred for a subsequent BiPAP titration, which was done at [facility] on 11/12/2014.  He was titrated up to 20/16 water pressure. Initially there was loud snoring, which resolved with BiPAP at optimal pressure.  Apparently, he recently got his new machine prior to obtaining the report of his recommended pressure.  He was just started at an IPAP of 12 and an EPAP of 10.  Senna meter water pressure until he saw me in sleep clinic.  This pressure is  Inadequate .  Based on his recent BiPAP titration.  He still complains of daytime sleepiness.  His Epworth Sleepiness Scale score today was 11 and shown below.   Epworth Sleepiness Scale: Situation   Chance of Dozing/Sleeping (0 = never , 1 = slight chance , 2 = moderate chance , 3 = high chance )   sitting and reading 3   watching TV 2   sitting inactive in a  public place 1   being a passenger in a motor vehicle for an hour or more 1   lying down in the afternoon 3   sitting and talking to someone 0   sitting quietly after lunch (no alcohol) 1   while stopped for a few minutes in traffic as the driver 0   Total Score  11    Past Medical History  Diagnosis Date  . Allergy     RHINITIS  . Hyperlipidemia   . Rosacea   . Smoker   . GERD (gastroesophageal reflux disease)     "not really"  . PAF (paroxysmal atrial fibrillation)     associated with OSA  . Sleep apnea     CPAP  . Shortness of breath     With activity  . Hypertension     takes Diovan and Toprol  . Arthritis     thumb  . History of echocardiogram 03/29/2009    EF 50-55%; mild concentric LVH;   . History of nuclear stress test 08/28/2007    moderate diaphragmatic attenuation; EKG neg for ischemia   . Systolic murmur   . Right bundle branch block     Past Surgical History  Procedure Laterality Date  . Shoulder closed reduction      Procedure: CLOSED REDUCTION SHOULDER;  Surgeon: Lawrence Santiago [facility];  Location: [place];  Service: Orthopedics;  Laterality: Right;  . Finger surgery      Reconstruction of Left thumb and L index finger.  Reconstruction   . Tonsillectomy      as a child  . Knee arthroscopy    . Shoulder open rotator cuff repair  08/11/2011    Procedure: ROTATOR CUFF REPAIR SHOULDER OPEN;  Surgeon: Willa Frater III;  Location: [place];  Service: Orthopedics;  Laterality: Right;    Allergies  Allergen Reactions  . Codeine Shortness Of Breath and Swelling  . Morphine And Related Shortness Of Breath and Swelling    Current Outpatient Prescriptions  Medication Sig Dispense Refill  . ALPRAZolam (XANAX) 1 MG tablet Take 1 tablet (1 mg total) by mouth at bedtime as needed. sleep 30 tablet 1  . aspirin EC 81 MG tablet Take 81 mg by mouth daily.     . cetirizine-pseudoephedrine (ZYRTEC-D) 5-120 MG per tablet Take 1 tablet by mouth at bedtime.     .  methocarbamol (ROBAXIN) 500 MG tablet Take 1 tablet (500 mg total) by mouth every 8 (eight) hours as needed for muscle spasms. 45 tablet 1  . metoprolol succinate (TOPROL-XL) 50 MG 24 hr tablet Take 1 tablet (50 mg total) by mouth daily. May take an extra 69m daily as needed. 60 tablet 11  . hydrochlorothiazide (MICROZIDE) 12.5 MG capsule Take 1 capsule (12.5 mg total) by mouth daily. 30 capsule 0  . valsartan-hydrochlorothiazide (DIOVAN HCT) 160-25 MG per tablet Take 1 tablet by mouth daily. 30 tablet 6   No current facility-administered medications for this visit.    History   Social History  . Marital Status: Single    Spouse Name: N/A  . Number of Children: N/A  . Years of Education: N/A   Occupational History  . Not on file.   Social History Main Topics  . Smoking status: Current Every Day Smoker -- 1.00 packs/day for 30 years    Types: Cigarettes  . Smokeless tobacco: Never Used  . Alcohol Use: 1.0 - 1.5 oz/week    2-3 drink(s) per week  . Drug Use: No  . Sexual Activity: Not on file   Other Topics Concern  . 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  At most he is sleeping 6 -7 hours per night.  He often gets home after 1 AM, eats, and then goes to bed at 3 AM.  He does not exercise.  Family History  Problem Relation Age of Onset  . Cancer - Colon Father   . Cancer Paternal Uncle   . Anesthesia problems Neg Hx   . Cancer - Prostate Paternal Grandfather   . Cancer - Prostate Maternal Grandfather      ROS General: Negative; No fevers, chills, or night sweats; positive for almost super morbid obesity HEENT: Negative; No changes in vision or hearing, sinus congestion, difficulty swallowing Pulmonary: Negative; No cough, wheezing, shortness of breath, hemoptysis Cardiovascular: Positive for hypertension.  No chest pain.  No palpitations. GI: Negative; No nausea, vomiting, diarrhea, or abdominal pain GU:  Negative; No dysuria, hematuria, or difficulty voiding Musculoskeletal: Negative; no myalgias, joint pain, or weakness Hematologic: Negative; no easy bruising, bleeding Endocrine: Negative; no heat/cold intolerance Neuro: Negative; no changes in balance, headaches Skin: Negative; No rashes or skin lesions Psychiatric: Negative; No behavioral problems, depression Sleep: Positive for severe obstructive sleep apnea and  daytime sleepiness, no  bruxism, restless legs, hypnogognic hallucinations, no cataplexy   Physical Exam BP 145/81 mmHg  Pulse 86  Ht '6\' 4"'  (1.93 m)  Wt 403 lb 14.4 oz (183.208 kg)  BMI 49.18 kg/m2  Wt Readings from Last 3 Encounters:  01/10/15 403 lb 14.4 oz (183.208 kg)  11/12/14 350 lb (158.759 kg)  09/22/14 350 lb (158.759 kg)   General: Alert, oriented, no distress.  Skin: normal turgor, no rashes HEENT: Normocephalic, atraumatic. Pupils round and reactive; sclera anicteric; extraocular muscles intact; Fundi arteriolar narrowing Nose without nasal septal hypertrophy Mouth/Parynx benign; Mallinpatti scale 4 Neck: No JVD, no carotid briuts Lungs: clear to ausculatation and percussion; no wheezing or rales  Chest wall: No tenderness to palpation Heart: RRR, s1 s2 normal  Abdomen: Significant central adiposity soft, nontender; no hepatosplenomehaly, BS+; abdominal aorta nontender and not dilated by palpation. Back: No CVA tenderness Pulses 2+ Extremities: no clubbinbg cyanosis or edema, Homan's sign negative  Neurologic: grossly nonfocal; cranial nerves intact. Psychological: Normal affect and mood.  LABS:  BMP Latest Ref Rng 08/09/2011 07/06/2011  Glucose 70 - 99 mg/dL 93 101(H)  BUN 6 - 23 mg/dL 19 19  Creatinine 0.50 - 1.35 mg/dL 0.87 0.85  Sodium 135 - 145 mEq/L 134(L) 134(L)  Potassium 3.5 - 5.1 mEq/L 4.0 3.9  Chloride 96 - 112 mEq/L 100 101  CO2 19 - 32 mEq/L 23 23  Calcium 8.4 - 10.5 mg/dL 9.9 9.5     Hepatic Function Latest Ref Rng 08/09/2011    Total Protein 6.0 - 8.3 g/dL 7.3  Albumin 3.5 - 5.2 g/dL 4.0  AST 0 - 37 U/L 15  ALT 0 - 53 U/L 22  Alk Phosphatase 39 - 117 U/L 87  Total Bilirubin 0.3 - 1.2 mg/dL 0.4     CBC Latest Ref Rng 08/09/2011 07/06/2011  WBC 4.0 - 10.5 K/uL 10.1 10.2  Hemoglobin 13.0 - 17.0 g/dL 15.6 16.1  Hematocrit 39.0 - 52.0 % 44.4 45.5  Platelets 150 - 400 K/uL 241 239     Lipid Panel  No results found for: CHOL, TRIG, HDL, CHOLHDL, VLDL, LDLCALC, LDLDIRECT   RADIOLOGY: No results found.    ASSESSMENT AND PLAN: David [name] is a 59 year old gentleman who is approaching super morbid obesity with a body mass index today of 49.2 kg/m.  He has severe obstructive sleep apnea documented since 2010.  On his most recent BiPAP titration.  He was titrated up to 20/16 with excellent benefit, oxygenation and resolution of snoring.  Apparently his machine recently was set inadequately at 12/10.  We will initially changes unit to a auto setting such that if necessary this can be titrated up to 25 cm water pressure.  He will need a pressure support of at least 4 an EPAP minimum of 10.  He continues to have edema and is hypertensive and has been taking valsartan HCT 160/12.5.  I have suggested that he change this to 160/25 mg and follow-up with Dr. Debara [name] concerning his blood pressure and edema.  I discussed the importance of exercise as well as weight loss.  He is compliant with his recent BiPAP unit with 100% of days with usage and 100% of days greater than 4 hours.  On his inadequate pressure his AHI was still significantly elevated at 64.5/hr, but he does note improvement from prior to getting his new machine.  A new download will be obtained in one month for reassessment of optimal therapy.  I will see him in one year  for follow-up sleep evaluation or sooner if problems arise.   Time spent: 25 minutes  Troy Sine, MD, [facility]  01/12/2015 6:17 PM

## 2015-01-20 ENCOUNTER — Encounter: Payer: Self-pay | Admitting: Cardiovascular Disease

## 2015-02-08 ENCOUNTER — Telehealth: Payer: Self-pay | Admitting: Cardiovascular Disease

## 2015-02-08 MED ORDER — VALSARTAN-HYDROCHLOROTHIAZIDE 160-25 MG PO TABS: 1.0000 | ORAL_TABLET | Freq: Every day | ORAL | Status: DC

## 2015-02-08 NOTE — Telephone Encounter (Signed)
°  1. Which medications need to be refilled? Valsartan -need new prescription,he lost it  2. Which pharmacy is medication to be sent to?[facility]-[phone] 3. Do they need a 30 day or 90 day supply? 90 and refills  4. Would they like a call back once the medication has been sent to the pharmacy? yes

## 2015-02-08 NOTE — Telephone Encounter (Signed)
Returned call to patient valsartan refill sent to pharmacy.

## 2015-02-09 ENCOUNTER — Encounter: Payer: Self-pay | Admitting: Cardiovascular Disease

## 2015-05-07 ENCOUNTER — Encounter (HOSPITAL_COMMUNITY): Payer: Self-pay | Admitting: Emergency Medicine

## 2015-05-07 ENCOUNTER — Emergency Department (HOSPITAL_COMMUNITY): Payer: 59

## 2015-05-07 ENCOUNTER — Observation Stay (HOSPITAL_COMMUNITY)
Admission: EM | Admit: 2015-05-07 | Discharge: 2015-05-09 | Disposition: A | Discharge status: Home / Self Care | Payer: 59 | Attending: Internal Medicine | Admitting: Internal Medicine

## 2015-05-07 DIAGNOSIS — I4510 Unspecified right bundle-branch block: Secondary | ICD-10-CM | POA: Diagnosis not present

## 2015-05-07 DIAGNOSIS — K219 Gastro-esophageal reflux disease without esophagitis: Secondary | ICD-10-CM | POA: Insufficient documentation

## 2015-05-07 DIAGNOSIS — Z7901 Long term (current) use of anticoagulants: Secondary | ICD-10-CM | POA: Insufficient documentation

## 2015-05-07 DIAGNOSIS — R079 Chest pain, unspecified: Secondary | ICD-10-CM | POA: Diagnosis present

## 2015-05-07 DIAGNOSIS — E785 Hyperlipidemia, unspecified: Secondary | ICD-10-CM | POA: Insufficient documentation

## 2015-05-07 DIAGNOSIS — J309 Allergic rhinitis, unspecified: Secondary | ICD-10-CM | POA: Diagnosis not present

## 2015-05-07 DIAGNOSIS — F17213 Nicotine dependence, cigarettes, with withdrawal: Secondary | ICD-10-CM | POA: Diagnosis present

## 2015-05-07 DIAGNOSIS — E871 Hypo-osmolality and hyponatremia: Secondary | ICD-10-CM | POA: Insufficient documentation

## 2015-05-07 DIAGNOSIS — I10 Essential (primary) hypertension: Secondary | ICD-10-CM | POA: Insufficient documentation

## 2015-05-07 DIAGNOSIS — I480 Paroxysmal atrial fibrillation: Secondary | ICD-10-CM | POA: Diagnosis not present

## 2015-05-07 DIAGNOSIS — I4891 Unspecified atrial fibrillation: Secondary | ICD-10-CM | POA: Diagnosis present

## 2015-05-07 DIAGNOSIS — G4733 Obstructive sleep apnea (adult) (pediatric): Secondary | ICD-10-CM | POA: Insufficient documentation

## 2015-05-07 DIAGNOSIS — Z7982 Long term (current) use of aspirin: Secondary | ICD-10-CM | POA: Insufficient documentation

## 2015-05-07 DIAGNOSIS — Z6841 Body Mass Index (BMI) 40.0 and over, adult: Secondary | ICD-10-CM | POA: Diagnosis not present

## 2015-05-07 DIAGNOSIS — R600 Localized edema: Secondary | ICD-10-CM | POA: Insufficient documentation

## 2015-05-07 DIAGNOSIS — Z87891 Personal history of nicotine dependence: Secondary | ICD-10-CM | POA: Diagnosis present

## 2015-05-07 DIAGNOSIS — F17210 Nicotine dependence, cigarettes, uncomplicated: Secondary | ICD-10-CM | POA: Insufficient documentation

## 2015-05-07 LAB — CBC
HCT: 46.3 % (ref 39.0–52.0)
Hemoglobin: 16 g/dL (ref 13.0–17.0)
MCH: 32.2 pg (ref 26.0–34.0)
MCHC: 34.6 g/dL (ref 30.0–36.0)
MCV: 93.2 fL (ref 78.0–100.0)
Platelets: 250 10*3/uL (ref 150–400)
RBC: 4.97 MIL/uL (ref 4.22–5.81)
RDW: 13.7 % (ref 11.5–15.5)
WBC: 10.5 10*3/uL (ref 4.0–10.5)

## 2015-05-07 LAB — BASIC METABOLIC PANEL
Anion gap: 11 (ref 5–15)
BUN: 20 mg/dL (ref 6–20)
CALCIUM: 9.1 mg/dL (ref 8.9–10.3)
CO2: 21 mmol/L — ABNORMAL LOW (ref 22–32)
CREATININE: 1.14 mg/dL (ref 0.61–1.24)
Chloride: 100 mmol/L — ABNORMAL LOW (ref 101–111)
GFR calc Af Amer: >60 mL/min (ref >60)
GLUCOSE: 97 mg/dL (ref 65–99)
Potassium: 3.6 mmol/L (ref 3.5–5.1)
Sodium: 132 mmol/L — ABNORMAL LOW (ref 135–145)

## 2015-05-07 MED ORDER — ASPIRIN 81 MG PO CHEW
162.0000 mg | CHEWABLE_TABLET | Freq: Once | ORAL | Status: AC
  Administered 2015-05-07: 162 mg via ORAL
  Filled 2015-05-07: qty 2

## 2015-05-07 MED ORDER — DILTIAZEM HCL 25 MG/5ML IV SOLN
10.0000 mg | Freq: Once | INTRAVENOUS | Status: AC
  Administered 2015-05-07: 10 mg via INTRAVENOUS
  Filled 2015-05-07: qty 5

## 2015-05-07 NOTE — ED Notes (Signed)
Patient going to xray and will come to room after

## 2015-05-07 NOTE — ED Notes (Signed)
Pt reports 1 hour ago he started to have mid sternal chest tightness with rapid hr. Pt with hx a-fib. Pt took half a dose of metoprolol and 0.5mg  xanax and 2 81 mg asa pta. Pt slightly diaphoretic.

## 2015-05-07 NOTE — ED Provider Notes (Signed)
CSN: [id number]   Arrival date & time 05/07/15 2303  History  This chart was scribed for [name], MD by Altamease Oiler, ED Scribe. This patient was seen in room D30C/D30C and the patient's care was started at 11:33 PM.  Chief Complaint  Patient presents with  . Chest Pain    HPI The history is provided by the patient. No language interpreter was used.   MUKHTAR SHAMS is a 59 y.o. male with PMHx of paroxysmal a-fib, RBBB, HTN, HLD, and GERD who presents to the Emergency Department complaining of central chest pain with sudden onset 1.5 hours ago after eating. The pain is rated  6/10  in severity. He states that it seems as though the pain radiates from the upper abdomen.  Associated symptoms include racing palpitations and sweating. He took Xanax, metoprolol and 2 baby aspirin at home without relief. Pt denies light headedness, nausea, and new LE swelling or pain. He has not had an episode of  a-fib since starting to sleep with CPAP 6 years ago. Pt notes being under a significant amount of work stress. No anticoagulation apart from aspirin. PCP Dr. Dimas Millin. Cardiologist: Dr. Buren Kos.  Past Medical History  Diagnosis Date  . Allergy     RHINITIS  . Hyperlipidemia   . Rosacea   . Smoker   . GERD (gastroesophageal reflux disease)     "not really"  . PAF (paroxysmal atrial fibrillation)     associated with OSA  . Sleep apnea     CPAP  . Shortness of breath     With activity  . Hypertension     takes Diovan and Toprol  . Arthritis     thumb  . History of echocardiogram 03/29/2009    EF 50-55%; mild concentric LVH;   . History of nuclear stress test 08/28/2007    moderate diaphragmatic attenuation; EKG neg for ischemia   . Systolic murmur   . Right bundle branch block     Past Surgical History  Procedure Laterality Date  . Shoulder closed reduction      Procedure: CLOSED REDUCTION SHOULDER;  Surgeon: Lawrence Santiago [facility];  Location: [place];  Service: Orthopedics;  Laterality:  Right;  . Finger surgery      Reconstruction of Left thumb and L index finger.  Reconstruction   . Tonsillectomy      as a child  . Knee arthroscopy    . Shoulder open rotator cuff repair  08/11/2011    Procedure: ROTATOR CUFF REPAIR SHOULDER OPEN;  Surgeon: Willa Frater III;  Location: [place];  Service: Orthopedics;  Laterality: Right;    Family History  Problem Relation Age of Onset  . Cancer - Colon Father   . Cancer Paternal Uncle   . Anesthesia problems Neg Hx   . Cancer - Prostate Paternal Grandfather   . Cancer - Prostate Maternal Grandfather     Social History  Substance Use Topics  . Smoking status: Current Every Day Smoker -- 1.00 packs/day for 30 years    Types: Cigarettes  . Smokeless tobacco: Never Used  . Alcohol Use: 1.0 - 1.5 oz/week    2-3 drink(s) per week     Review of Systems  Constitutional: Positive for diaphoresis. Negative for fever and chills.  Respiratory: Negative for shortness of breath.   Cardiovascular: Positive for chest pain and palpitations. Negative for leg swelling.  Gastrointestinal: Positive for abdominal pain. Negative for nausea, vomiting, diarrhea and constipation.  Musculoskeletal: Negative for back  pain, neck pain and neck stiffness.  Skin: Negative for rash and wound.  Neurological: Negative for dizziness, weakness, light-headedness, numbness and headaches.  All other systems reviewed and are negative.  Home Medications   Prior to Admission medications   Medication Sig Start Date End Date Taking? Authorizing Provider  ALPRAZolam Duanne Moron) 1 MG tablet Take 1 tablet (1 mg total) by mouth at bedtime as needed. sleep 07/03/13  Yes Camelia Eng Tysinger, PA-C  aspirin EC 81 MG tablet Take 81 mg by mouth daily.    Yes Historical Provider, MD  cetirizine-pseudoephedrine (ZYRTEC-D) 5-120 MG per tablet Take 1 tablet by mouth at bedtime.    Yes Historical Provider, MD  metoprolol succinate (TOPROL-XL) 50 MG 24 hr tablet Take 1 tablet (50 mg  total) by mouth daily. May take an extra 25mg  daily as needed. 07/06/14  Yes [name], MD  naproxen sodium (ANAPROX) 220 MG tablet Take 440 mg by mouth 2 (two) times daily with a meal.   Yes Historical Provider, MD  valsartan-hydrochlorothiazide (DIOVAN HCT) 160-25 MG per tablet Take 1 tablet by mouth daily. 02/08/15  Yes [name], MD  hydrochlorothiazide (MICROZIDE) 12.5 MG capsule Take 1 capsule (12.5 mg total) by mouth daily. Patient not taking: Reported on 05/08/2015 01/10/15   Troy Sine, MD    Allergies  Codeine and Morphine and related  Triage Vitals: BP 113/82 mmHg  Pulse 155  Temp(Src) 98 F (36.7 C) (Oral)  Resp 24  Ht 6\' 4"  (1.93 m)  Wt 375 lb (170.099 kg)  BMI 45.67 kg/m2  SpO2 96%  Physical Exam  Constitutional: He is oriented to person, place, and time. He appears well-developed and well-nourished. No distress.  Anxious appearing  HENT:  Head: Normocephalic and atraumatic.  Mouth/Throat: Oropharynx is clear and moist. No oropharyngeal exudate.  Eyes: EOM are normal. Pupils are equal, round, and reactive to light.  Neck: Normal range of motion. Neck supple.  Cardiovascular:  Tachycardia.  Irregularly irregular rhythm  Pulmonary/Chest: Effort normal and breath sounds normal. No respiratory distress. He has no wheezes. He has no rales. He exhibits no tenderness.  Abdominal: Soft. Bowel sounds are normal. He exhibits no distension. There is tenderness.  Mild epigastric tenderness with palpation.  Musculoskeletal: Normal range of motion. He exhibits no edema or tenderness.  1+ right lower extremity swelling without pitting edema. 2+ left lower extremity swelling without pitting edema. No calf tenderness. Distal pulses intact and symmetric. Patient states this is his baseline.  Neurological: He is alert and oriented to person, place, and time.  Moves all extremities without deficit. Sensation is fully intact.  Skin: Skin is warm and dry. No rash noted. No  erythema.  Psychiatric: He has a normal mood and affect. His behavior is normal.  Nursing note and vitals reviewed.   ED Course  Procedures   DIAGNOSTIC STUDIES: Oxygen Saturation is 96% on RA, normal by my interpretation.    COORDINATION OF CARE: 11:39 PM Discussed treatment plan which includes CXR, lab work, EKG, aspirin and Cardizem with pt at bedside and pt agreed to plan.  12:28 AM I re-evaluated the patient and provided an update on the results of his lab work so far. He feels better. HR 92-99 BPM on the monitor. I informed him of the plan for admission to the hospital. He is in agreement.   1:33 AM-Consult complete with Dr. Sheran Fava (Hospitalist). Patient case explained and discussed. Agrees to admit patient for further evaluation and treatment. Call ended  at 1:35 AM   Labs Reviewed  BASIC METABOLIC PANEL - Abnormal; Notable for the following:    Sodium 132 (*)    Chloride 100 (*)    CO2 21 (*)    All other components within normal limits  CBC  LIPASE, BLOOD  HEPATIC FUNCTION PANEL  I-STAT TROPOININ, ED    Imaging Review Dg Chest Portable 1 View  05/08/2015   CLINICAL DATA:  Chest pain and shortness of breath today.  EXAM: PORTABLE CHEST - 1 VIEW  COMPARISON:  04/29/2012  FINDINGS: The heart is enlarged. There are diffusely increased interstitial opacities. No confluent airspace disease. There is no pleural effusion or pneumothorax. No acute osseous abnormalities.  IMPRESSION: Cardiomegaly. Chronically increased interstitial opacities, unchanged allowing for differences in technique.   Electronically Signed   By: Jeb Levering M.D.   On: 05/08/2015 01:02    EKG Interpretation  Date/Time:  Saturday May 07 2015 23:05:43 EDT Ventricular Rate:  156 PR Interval:    QRS Duration: 144 QT Interval:  310 QTC Calculation: 499 R Axis:   -75 Text Interpretation: Atrial fibrillation with RVR and right bundle-branch block.  CRITICAL CARE Performed by: Lita Mains,  Justn Total critical care time: 20 min Critical care time was exclusive of separately billable procedures and treating other patients. Critical care was necessary to treat or prevent imminent or life-threatening deterioration. Critical care was time spent personally by me on the following activities: development of treatment plan with patient and/or surrogate as well as nursing, discussions with consultants, evaluation of patient's response to treatment, examination of patient, obtaining history from patient or surrogate, ordering and performing treatments and interventions, ordering and review of laboratory studies, ordering and review of radiographic studies, pulse oximetry and re-evaluation of patient's condition.   MDM   Final diagnoses:  Atrial fibrillation with RVR  Central chest pain     I personally performed the services described in this documentation, which was scribed in my presence. The recorded information has been reviewed and is accurate.   Patient's heart rate has improved after Cardizem drip. States the chest pain is nearly resolved. Patient will be admitted to observation bed in the second unit.  [name], MD 05/08/15 [phone]

## 2015-05-08 ENCOUNTER — Observation Stay (HOSPITAL_BASED_OUTPATIENT_CLINIC_OR_DEPARTMENT_OTHER): Payer: 59

## 2015-05-08 ENCOUNTER — Encounter (HOSPITAL_COMMUNITY): Payer: Self-pay | Admitting: Internal Medicine

## 2015-05-08 DIAGNOSIS — R079 Chest pain, unspecified: Secondary | ICD-10-CM | POA: Diagnosis present

## 2015-05-08 DIAGNOSIS — Z7982 Long term (current) use of aspirin: Secondary | ICD-10-CM | POA: Diagnosis not present

## 2015-05-08 DIAGNOSIS — R600 Localized edema: Secondary | ICD-10-CM

## 2015-05-08 DIAGNOSIS — Z720 Tobacco use: Secondary | ICD-10-CM

## 2015-05-08 DIAGNOSIS — Z87891 Personal history of nicotine dependence: Secondary | ICD-10-CM | POA: Diagnosis present

## 2015-05-08 DIAGNOSIS — I10 Essential (primary) hypertension: Secondary | ICD-10-CM | POA: Diagnosis not present

## 2015-05-08 DIAGNOSIS — F17213 Nicotine dependence, cigarettes, with withdrawal: Secondary | ICD-10-CM | POA: Diagnosis not present

## 2015-05-08 DIAGNOSIS — I4891 Unspecified atrial fibrillation: Secondary | ICD-10-CM | POA: Diagnosis not present

## 2015-05-08 DIAGNOSIS — I4510 Unspecified right bundle-branch block: Secondary | ICD-10-CM | POA: Diagnosis not present

## 2015-05-08 DIAGNOSIS — I480 Paroxysmal atrial fibrillation: Secondary | ICD-10-CM | POA: Diagnosis not present

## 2015-05-08 DIAGNOSIS — G4733 Obstructive sleep apnea (adult) (pediatric): Secondary | ICD-10-CM | POA: Diagnosis not present

## 2015-05-08 DIAGNOSIS — J309 Allergic rhinitis, unspecified: Secondary | ICD-10-CM | POA: Diagnosis present

## 2015-05-08 LAB — MRSA PCR SCREENING: MRSA by PCR: NEGATIVE

## 2015-05-08 LAB — LIPASE, BLOOD: LIPASE: 24 U/L (ref 22–51)

## 2015-05-08 LAB — TSH: TSH: 2.889 u[IU]/mL (ref 0.350–4.500)

## 2015-05-08 LAB — TROPONIN I
Troponin I: 0.03 ng/mL (ref <0.031)
Troponin I: <0.03 ng/mL (ref <0.031)

## 2015-05-08 LAB — HEPATIC FUNCTION PANEL
ALT: 31 U/L (ref 17–63)
AST: 30 U/L (ref 15–41)
Albumin: 3.7 g/dL (ref 3.5–5.0)
Alkaline Phosphatase: 64 U/L (ref 38–126)
BILIRUBIN DIRECT: 0.2 mg/dL (ref 0.1–0.5)
BILIRUBIN INDIRECT: 0.7 mg/dL (ref 0.3–0.9)
Total Bilirubin: 0.9 mg/dL (ref 0.3–1.2)
Total Protein: 6.9 g/dL (ref 6.5–8.1)

## 2015-05-08 LAB — I-STAT TROPONIN, ED: Troponin i, poc: 0.01 ng/mL (ref 0.00–0.08)

## 2015-05-08 MED ORDER — ATORVASTATIN CALCIUM 80 MG PO TABS
80.0000 mg | ORAL_TABLET | Freq: Every day | ORAL | Status: DC
  Administered 2015-05-08: 80 mg via ORAL
  Filled 2015-05-08: qty 1

## 2015-05-08 MED ORDER — ALPRAZOLAM 0.5 MG PO TABS: 1.0000 mg | ORAL_TABLET | Freq: Every evening | ORAL | Status: DC | PRN

## 2015-05-08 MED ORDER — HEPARIN BOLUS VIA INFUSION
5000.0000 [IU] | Freq: Once | INTRAVENOUS | Status: AC
  Administered 2015-05-08: 5000 [IU] via INTRAVENOUS
  Filled 2015-05-08: qty 5000

## 2015-05-08 MED ORDER — NICOTINE 21 MG/24HR TD PT24
21.0000 mg | MEDICATED_PATCH | Freq: Every day | TRANSDERMAL | Status: DC
  Administered 2015-05-08 – 2015-05-09 (×2): 21 mg via TRANSDERMAL
  Filled 2015-05-08 (×2): qty 1

## 2015-05-08 MED ORDER — HEPARIN (PORCINE) IN NACL 100-0.45 UNIT/ML-% IJ SOLN
2000.0000 [IU]/h | INTRAMUSCULAR | Status: DC
  Administered 2015-05-08: 2000 [IU]/h via INTRAVENOUS
  Filled 2015-05-08: qty 250

## 2015-05-08 MED ORDER — DILTIAZEM HCL 100 MG IV SOLR
5.0000 mg/h | Freq: Once | INTRAVENOUS | Status: AC
  Administered 2015-05-08: 5 mg/h via INTRAVENOUS
  Filled 2015-05-08: qty 100

## 2015-05-08 MED ORDER — INFLUENZA VAC SPLIT QUAD 0.5 ML IM SUSY
0.5000 mL | PREFILLED_SYRINGE | INTRAMUSCULAR | Status: DC
  Filled 2015-05-08: qty 0.5

## 2015-05-08 MED ORDER — ASPIRIN EC 81 MG PO TBEC
81.0000 mg | DELAYED_RELEASE_TABLET | Freq: Every day | ORAL | Status: DC
  Administered 2015-05-08 – 2015-05-09 (×2): 81 mg via ORAL
  Filled 2015-05-08 (×2): qty 1

## 2015-05-08 MED ORDER — PNEUMOCOCCAL VAC POLYVALENT 25 MCG/0.5ML IJ INJ
0.5000 mL | INJECTION | INTRAMUSCULAR | Status: DC
  Filled 2015-05-08: qty 0.5

## 2015-05-08 MED ORDER — NAPROXEN 500 MG PO TABS
500.0000 mg | ORAL_TABLET | Freq: Two times a day (BID) | ORAL | Status: DC
  Filled 2015-05-08 (×3): qty 1

## 2015-05-08 MED ORDER — FLUTICASONE PROPIONATE 50 MCG/ACT NA SUSP
2.0000 | Freq: Every day | NASAL | Status: DC
  Administered 2015-05-09: 2 via NASAL
  Filled 2015-05-08: qty 16

## 2015-05-08 MED ORDER — METOPROLOL SUCCINATE ER 50 MG PO TB24
50.0000 mg | ORAL_TABLET | Freq: Every day | ORAL | Status: DC
  Administered 2015-05-08 – 2015-05-09 (×2): 50 mg via ORAL
  Filled 2015-05-08 (×3): qty 1

## 2015-05-08 MED ORDER — NITROGLYCERIN 0.4 MG SL SUBL: 0.4000 mg | SUBLINGUAL_TABLET | SUBLINGUAL | Status: DC | PRN

## 2015-05-08 MED ORDER — SODIUM CHLORIDE 0.9 % IV SOLN
INTRAVENOUS | Status: AC
  Administered 2015-05-08: 03:00:00 via INTRAVENOUS

## 2015-05-08 MED ORDER — DILTIAZEM HCL 30 MG PO TABS
30.0000 mg | ORAL_TABLET | Freq: Four times a day (QID) | ORAL | Status: DC
  Administered 2015-05-08 – 2015-05-09 (×5): 30 mg via ORAL
  Filled 2015-05-08 (×5): qty 1

## 2015-05-08 MED ORDER — NAPROXEN SODIUM 550 MG PO TABS
550.0000 mg | ORAL_TABLET | Freq: Two times a day (BID) | ORAL | Status: DC
  Filled 2015-05-08 (×3): qty 1

## 2015-05-08 MED ORDER — ONDANSETRON HCL 4 MG/2ML IJ SOLN: 4.0000 mg | Freq: Four times a day (QID) | INTRAMUSCULAR | Status: DC | PRN

## 2015-05-08 MED ORDER — ACETAMINOPHEN 325 MG PO TABS: 650.0000 mg | ORAL_TABLET | ORAL | Status: DC | PRN

## 2015-05-08 NOTE — Progress Notes (Signed)
RT Note: Pt states he can place himself on CPAP when ready. Rt adjusted settings for pt comfort to Auto titrate min 14, max 20. RT will continue to monitor

## 2015-05-08 NOTE — H&P (Signed)
[facility] History and Physical  David [name] RCV:[id number] DOB: 10/18/1955 DOA: 05/07/2015  Referring physician:  [name] PCP:  Wyatt Haste, MD   Chief Complaint:  Chest pain  HPI:  The patient is a 59 y.o. year-old male with history of paroxysmal atrial fibrillation, right bundle branch block, hypertension, hyperlipidemia, obstructive sleep apnea on bipap, GERD, smoking who presents with palpitations and chest pain.  The patient has been particularly stressed out about a work project recently.  He states that he had paroxysmal atrial fibrillation approximate 6 years ago that was attributed to obstructive sleep apnea. He has not had any further atrial fibrillation since starting BiPAP.  He states that last night around 9:30 PM he developed decreased appetite which is unusual for him. A few minutes later he developed some progressive epigastric and substernal chest pressure which did not radiate. About that time he noticed that his pulse was fast and irregular. His chest pain progressively worsened until it was 9 out of 10 while here in the emergency department. He denies associated shortness of breath, nausea, diaphoresis. He did have some lightheadedness but denies feeling faint or loss of consciousness. He denies any recent fevers, chills, signs of infection. He has not had any other episodes of chest pain, and now that his heart rate is controlled, his chest pain has completely resolved.  He took 2 baby aspirin, Xanax, and half a dose of his metoprolol which did not help before coming to the hospital.    In the emergency department, he was given aspirin, and started on diltiazem infusion.  His heart rate trended down from the 150s to the 60s. His blood pressure remained stable. His lab are notable only for mild hyponatremia of 132, CO2 of 21, creatinine at baseline. His chest x-ray demonstrated cardiomegaly with chronically increased interstitial opacities. Troponin was  negative and his EKG demonstrated A. fib with RVR with right bundle branch block and ST segment depressions in the lateral leads that were likely tachycardia induced.   Review of Systems:  General:  Denies fevers, chills, weight loss or gain HEENT:  Denies changes to hearing and vision, rhinorrhea, sinus congestion, sore throat CV:  Chronic bilateral lower extremity edema left worse than right.  PULM:  Denies SOB, wheezing, cough.   GI:  Denies nausea, vomiting, constipation, diarrhea.   GU:  Denies dysuria, frequency, urgency ENDO:  Denies polyuria, polydipsia.   HEME:  Denies hematemesis, blood in stools, melena, abnormal bruising or bleeding.  LYMPH:  Denies lymphadenopathy.   MSK:  Denies arthralgias, myalgias.   DERM:  Denies skin rash or ulcer.   NEURO:  Denies focal numbness, weakness, slurred speech, confusion, facial droop.  PSYCH:  Chronic anxiety and denies depression.    Past Medical History  Diagnosis Date  . Allergy     RHINITIS  . Hyperlipidemia   . Rosacea   . Smoker   . GERD (gastroesophageal reflux disease)     "not really"  . PAF (paroxysmal atrial fibrillation)     associated with OSA  . Sleep apnea     CPAP  . Shortness of breath     With activity  . Hypertension     takes Diovan and Toprol  . Arthritis     thumb  . History of echocardiogram 03/29/2009    EF 50-55%; mild concentric LVH;   . History of nuclear stress test 08/28/2007    moderate diaphragmatic attenuation; EKG neg for ischemia   . Systolic murmur   .  Right bundle branch block    Past Surgical History  Procedure Laterality Date  . Shoulder closed reduction      Procedure: CLOSED REDUCTION SHOULDER;  Surgeon: Lawrence Santiago [facility];  Location: [place];  Service: Orthopedics;  Laterality: Right;  . Finger surgery      Reconstruction of Left thumb and L index finger.  Reconstruction   . Tonsillectomy      as a child  . Knee arthroscopy    . Shoulder open rotator cuff repair  08/11/2011     Procedure: ROTATOR CUFF REPAIR SHOULDER OPEN;  Surgeon: Willa Frater III;  Location: [place];  Service: Orthopedics;  Laterality: Right;   Social History:  reports that he has been smoking Cigarettes.  He has a 30 pack-year smoking history. He has never used smokeless tobacco. He reports that he drinks about 1.2 - 1.8 oz of alcohol per week. He reports that he does not use illicit drugs.  Allergies  Allergen Reactions  . Codeine Shortness Of Breath and Swelling  . Morphine And Related Shortness Of Breath and Swelling    Family History  Problem Relation Age of Onset  . Cancer - Colon Father   . Cancer Paternal Uncle   . Anesthesia problems Neg Hx   . Cancer - Prostate Paternal Grandfather   . Cancer - Prostate Maternal Grandfather   . Heart attack Father 50  . Stroke Neg Hx   . Heart disease Mother      Prior to Admission medications   Medication Sig Start Date End Date Taking? Authorizing Provider  ALPRAZolam Duanne Moron) 1 MG tablet Take 1 tablet (1 mg total) by mouth at bedtime as needed. sleep 07/03/13  Yes Camelia Eng Tysinger, PA-C  aspirin EC 81 MG tablet Take 81 mg by mouth daily.    Yes Historical Provider, MD  cetirizine-pseudoephedrine (ZYRTEC-D) 5-120 MG per tablet Take 1 tablet by mouth at bedtime.    Yes Historical Provider, MD  metoprolol succinate (TOPROL-XL) 50 MG 24 hr tablet Take 1 tablet (50 mg total) by mouth daily. May take an extra 25mg  daily as needed. 07/06/14  Yes [name], MD  naproxen sodium (ANAPROX) 220 MG tablet Take 440 mg by mouth 2 (two) times daily with a meal.   Yes Historical Provider, MD  valsartan-hydrochlorothiazide (DIOVAN HCT) 160-25 MG per tablet Take 1 tablet by mouth daily. 02/08/15  Yes [name], MD  hydrochlorothiazide (MICROZIDE) 12.5 MG capsule Take 1 capsule (12.5 mg total) by mouth daily. Patient not taking: Reported on 05/08/2015 01/10/15   Troy Sine, MD   Physical Exam: Filed Vitals:   05/08/15 0145 05/08/15 0154 05/08/15  0200 05/08/15 0215  BP: 105/47 105/47 108/60 109/61  Pulse: 98 95    Temp:      TempSrc:      Resp: 18 20 18 22   Height:      Weight:      SpO2: 94%        General:  Morbidly obese adult male, no acute distress  Eyes:  PERRL, anicteric, non-injected.  ENT:  Nares clear.  OP clear, non-erythematous without plaques or exudates.  MMM.  Neck:  Supple without TM or JVD.    Lymph:  No cervical, supraclavicular, or submandibular LAD.  Cardiovascular:  IRRR heart rate in the 90s, normal S1, S2, 3/6 systolic murmur heard at the left sternal border.  2+ pulses, warm extremities  Respiratory:  CTA bilaterally without increased WOB.  Abdomen:  NABS.  Soft, ND/NT.    Skin:  No rashes or focal lesions.  Musculoskeletal:  Normal bulk and tone.  2+ pitting left LE edema and 1+ pitting right which is chronic and previously evaluated by lower extremity duplex  Psychiatric:  A & O x 4.  Appropriate affect.  Neurologic:  CN 3-12 intact.  5/5 strength.  Sensation intact.  Labs on Admission:  Basic Metabolic Panel:  Recent Labs Lab 05/07/15 2315  NA 132*  K 3.6  CL 100*  CO2 21*  GLUCOSE 97  BUN 20  CREATININE 1.14  CALCIUM 9.1   Liver Function Tests:  Recent Labs Lab 05/08/15 0032  AST 30  ALT 31  ALKPHOS 64  BILITOT 0.9  PROT 6.9  ALBUMIN 3.7    Recent Labs Lab 05/08/15 0032  LIPASE 24   No results for input(s): AMMONIA in the last 168 hours. CBC:  Recent Labs Lab 05/07/15 2315  WBC 10.5  HGB 16.0  HCT 46.3  MCV 93.2  PLT 250   Cardiac Enzymes: No results for input(s): CKTOTAL, CKMB, CKMBINDEX, TROPONINI in the last 168 hours.  BNP (last 3 results) No results for input(s): BNP in the last 8760 hours.  ProBNP (last 3 results) No results for input(s): PROBNP in the last 8760 hours.  CBG: No results for input(s): GLUCAP in the last 168 hours.  Radiological Exams on Admission: Dg Chest Portable 1 View  05/08/2015   CLINICAL DATA:  Chest pain and  shortness of breath today.  EXAM: PORTABLE CHEST - 1 VIEW  COMPARISON:  04/29/2012  FINDINGS: The heart is enlarged. There are diffusely increased interstitial opacities. No confluent airspace disease. There is no pleural effusion or pneumothorax. No acute osseous abnormalities.  IMPRESSION: Cardiomegaly. Chronically increased interstitial opacities, unchanged allowing for differences in technique.   Electronically Signed   By: Jeb Levering M.D.   On: 05/08/2015 01:02    EKG: Independently reviewed. Atrial fibrillation with right bundle branch block, no ST segment elevations or depressions concerning for acute ischemia  Assessment/Plan Principal Problem:   Atrial fibrillation with RVR Active Problems:   PAF (paroxysmal atrial fibrillation)   OSA on CPAP   HTN (hypertension)   Cigarette nicotine dependence with withdrawal   Allergic rhinitis   Chest pain  ---  Paroxysmal atrial fibrillation, rate controlled on diltiazem gtt.  CHADs2vasc = 1 for hypertension.  Needs at least aspirin.  I have started heparin gtt in case he does not spontaneously convert and cardiology decides to cardiovert.   -  Continue metoprolol -  Discontinue Sudafed -  Start diltiazem 30 mg by mouth every 6 hours with hold parameters for low blood pressure -  Transition off diltiazem drip -  Heparin drip -  If he does not spontaneously convert to normal sinus rhythm, would recommend a cardiology consultation in the morning for cardioversion prior to discharge since his atrial fibrillation is quite rare and symptomatic  Chest pain likely related to a-fib but could have preceded a-fib.  He has risk factors of obesity, high blood pressure, hyperlipidemia, and smoking.  HEART score of 4:  History 1, ECG 0, Age 25, risk factors 2 -  Cycle troponins -  Telemetry -  Hemoglobin A1c and lipid panel -  Check TSH -  Echocardiogram -  Consider outpatient stress test  HTN/HLD, blood pressure stable to low -  Hold  valsartan-HCTZ for now  OSA, stable, continue BiPAP  Allergic rhinitis -  D/c Zyrtec-D and start flonase  Cigarette  nicotine abuse and dependence with withdrawal -  Counseled smoking cessation -  Offered nicotine patch  Diet:  Healthy heart  Access:  PIV  IVF:  Off  Proph:  Heparin gtt  Code Status: Full code  Family Communication: Patient alone  Disposition Plan: Admit to stepdown   Time spent: 60 min SHORT, [facility] Pager [phone]  If 7PM-7AM, please contact night-coverage [email or web address] Password [email or web address] 05/08/2015, 2:42 AM

## 2015-05-08 NOTE — Progress Notes (Signed)
ANTICOAGULATION CONSULT NOTE - Initial Consult  Pharmacy Consult for Heparin Indication: atrial fibrillation  Allergies  Allergen Reactions  . Codeine Shortness Of Breath and Swelling  . Morphine And Related Shortness Of Breath and Swelling    Patient Measurements: Height: 6\' 4"  (193 cm) Weight: (!) 375 lb (170.099 kg) IBW/kg (Calculated) : 86.8 Heparin Dosing Weight:  125 kg  Vital Signs: Temp: 98 F (36.7 C) (09/17 2308) Temp Source: Oral (09/17 2308) BP: 109/61 mmHg (09/18 0215) Pulse Rate: 95 (09/18 0154)  Labs:  Recent Labs  05/07/15 2315  HGB 16.0  HCT 46.3  PLT 250  CREATININE 1.14    Estimated Creatinine Clearance: 120 mL/min (by C-G formula based on Cr of 1.14).   Medical History: Past Medical History  Diagnosis Date  . Allergy     RHINITIS  . Hyperlipidemia   . Rosacea   . Smoker   . GERD (gastroesophageal reflux disease)     "not really"  . PAF (paroxysmal atrial fibrillation)     associated with OSA  . Sleep apnea     CPAP  . Shortness of breath     With activity  . Hypertension     takes Diovan and Toprol  . Arthritis     thumb  . History of echocardiogram 03/29/2009    EF 50-55%; mild concentric LVH;   . History of nuclear stress test 08/28/2007    moderate diaphragmatic attenuation; EKG neg for ischemia   . Systolic murmur   . Right bundle branch block     Medications:  Xanax  Toprol XL  Diovan HCT  ASA  Zyrtec-D  Naproxen  Assessment: 59 y.o. male with chest pain/palpitations for heparin  Goal of Therapy:  Heparin level 0.3-0.7 units/ml Monitor platelets by anticoagulation protocol: Yes   Plan:  Heparin 5000 units IV bolus, then start heparin 2000 units/hr Check heparin level in 6 hours.   Abbott, Bronson Curb 05/08/2015,2:25 AM

## 2015-05-08 NOTE — Progress Notes (Signed)
PROGRESS NOTE  David [name] LDJ:[id number] DOB: 03/14/1956 DOA: 05/07/2015 PCP: Wyatt Haste, MD Brief History 59 year old male with a history of morbid obesity, paroxysmal atrial fibrillation, hypertension presented with palpitations, substernal chest discomfort that began around 9:30 PM on 05/07/2015. He denies any dizziness, diaphoresis, nausea. He has been compliant with all his medications. He has been 100% compliant with his CPAP. There has been no recent changes in his medications. Upon evaluation in the emergency department, the patient was found to have atrial fibrillation with RVR. He was started on diltiazem drip. Assessment/Plan: Paroxysmal atrial fibrillation -CHADS-VASc =1 -Likely due to his continued obstructive sleep apnea -The patient has gained 20 pounds in the past 8 months. -Spontaneously converted back to sinus rhythm -Continue aspirin -Continue metoprolol succinate -Continue diltiazem -Recheck echocardiogram -d/c heparin drip Obstructive sleep apnea -Recent sleep study May 2016--no changes in his settings -Continue CPAP during the hospitalization Lower extremity edema -Patient has had a history of left saphenous vein reflux/insufficiency resulting in chronic left lower extremity edema -Recheck venous duplex to rule out DVT  tobacco abuse  -30-pack-year history  -NicoDerm patch  -Tobacco cessation discussed Chronic right bundle branch block -No significant changes on EKG Morbid obesity -BMI 49.6 -Has gained 20 pounds in the past 8 months  -Discussed importance of lifestyle modification -consult dietician  Family Communication:   Pt at beside Disposition Plan:   Transfer to telemetry       Procedures/Studies: Dg Chest Portable 1 View  05/08/2015   CLINICAL DATA:  Chest pain and shortness of breath today.  EXAM: PORTABLE CHEST - 1 VIEW  COMPARISON:  04/29/2012  FINDINGS: The heart is enlarged. There are diffusely increased interstitial  opacities. No confluent airspace disease. There is no pleural effusion or pneumothorax. No acute osseous abnormalities.  IMPRESSION: Cardiomegaly. Chronically increased interstitial opacities, unchanged allowing for differences in technique.   Electronically Signed   By: Jeb Levering M.D.   On: 05/08/2015 01:02        Subjective: Patient denies fevers, chills, headache, chest pain, dyspnea, nausea, vomiting, diarrhea, abdominal pain, dysuria, hematuria   Objective: Filed Vitals:   05/08/15 0330 05/08/15 0412 05/08/15 0620 05/08/15 0724  BP: 150/89   119/48  Pulse: 133  77 68  Temp:  98.1 F (36.7 C)  98.4 F (36.9 C)  TempSrc:  Oral  Axillary  Resp: 18   20  Height: 6\' 4"  (1.93 m)     Weight: 184.4 kg (406 lb 8.5 oz)     SpO2: 98%  94% 96%    Intake/Output Summary (Last 24 hours) at 05/08/15 0905 Last data filed at 05/08/15 0700  Gross per 24 hour  Intake 546.17 ml  Output    300 ml  Net 246.17 ml   Weight change:  Exam:   General:  Pt is alert, follows commands appropriately, not in acute distress  HEENT: No icterus, No thrush, No neck mass, [place]/AT  Cardiovascular: RRR, S1/S2, no rubs, no gallops  Respiratory: CTA bilaterally, no wheezing, no crackles, no rhonchi  Abdomen: Soft/+BS, non tender, non distended, no guarding; no hepatosplenomegaly  Extremities: 2+ LE  Edema--L>R, No lymphangitis, No petechiae, No rashes, no synovitis; no cyanosis or clubbing   Data Reviewed: Basic Metabolic Panel:  Recent Labs Lab 05/07/15 2315  NA 132*  K 3.6  CL 100*  CO2 21*  GLUCOSE 97  BUN 20  CREATININE 1.14  CALCIUM 9.1   Liver Function Tests:  Recent Labs Lab 05/08/15 0032  AST 30  ALT 31  ALKPHOS 64  BILITOT 0.9  PROT 6.9  ALBUMIN 3.7    Recent Labs Lab 05/08/15 0032  LIPASE 24   No results for input(s): AMMONIA in the last 168 hours. CBC:  Recent Labs Lab 05/07/15 2315  WBC 10.5  HGB 16.0  HCT 46.3  MCV 93.2  PLT 250   Cardiac  Enzymes:  Recent Labs Lab 05/08/15 0450  TROPONINI 0.03   BNP: Invalid input(s): POCBNP CBG: No results for input(s): GLUCAP in the last 168 hours.  Recent Results (from the past 240 hour(s))  MRSA PCR Screening     Status: None   Collection Time: 05/08/15  3:00 AM  Result Value Ref Range Status   MRSA by PCR NEGATIVE NEGATIVE Final    Comment:        The GeneXpert MRSA Assay (FDA approved for NASAL specimens only), is one component of a comprehensive MRSA colonization surveillance program. It is not intended to diagnose MRSA infection nor to guide or monitor treatment for MRSA infections.      Scheduled Meds: . sodium chloride   Intravenous STAT  . aspirin EC  81 mg Oral Daily  . atorvastatin  80 mg Oral q1800  . diltiazem  30 mg Oral 4 times per day  . fluticasone  2 spray Each Nare Daily  . [START ON 05/09/2015] Influenza vac split quadrivalent PF  0.5 mL Intramuscular Tomorrow-1000  . metoprolol succinate  50 mg Oral Daily  . nicotine  21 mg Transdermal Daily  . [START ON 05/09/2015] pneumococcal 23 valent vaccine  0.5 mL Intramuscular Tomorrow-1000   Continuous Infusions:     TAT, Amahd, DO  [facility] Pager [phone]  If 7PM-7AM, please contact night-coverage [email or web address] Password [email or web address] 05/08/2015, 9:05 AM

## 2015-05-08 NOTE — Progress Notes (Signed)
*  PRELIMINARY RESULTS* Echocardiogram 2D Echocardiogram has been performed.  Leavy Cella 05/08/2015, 1:30 PM

## 2015-05-08 NOTE — Progress Notes (Signed)
VASCULAR LAB PRELIMINARY  PRELIMINARY  PRELIMINARY  PRELIMINARY  Bilateral lower extremity venous duplex completed.    Preliminary report:  There is no DVT or SVT noted in the bilateral lower extremities.   KANADY, CANDACE, RVT 05/08/2015, 3:37 PM

## 2015-05-08 NOTE — Progress Notes (Signed)
Patient converted to NSR. Cardizem drip off, patient transitioned to PO.

## 2015-05-09 ENCOUNTER — Other Ambulatory Visit: Payer: Self-pay | Admitting: Internal Medicine

## 2015-05-09 DIAGNOSIS — R600 Localized edema: Secondary | ICD-10-CM | POA: Diagnosis not present

## 2015-05-09 DIAGNOSIS — F17213 Nicotine dependence, cigarettes, with withdrawal: Secondary | ICD-10-CM

## 2015-05-09 DIAGNOSIS — I10 Essential (primary) hypertension: Secondary | ICD-10-CM

## 2015-05-09 DIAGNOSIS — R072 Precordial pain: Secondary | ICD-10-CM

## 2015-05-09 DIAGNOSIS — G4733 Obstructive sleep apnea (adult) (pediatric): Secondary | ICD-10-CM

## 2015-05-09 DIAGNOSIS — I480 Paroxysmal atrial fibrillation: Secondary | ICD-10-CM

## 2015-05-09 DIAGNOSIS — Z720 Tobacco use: Secondary | ICD-10-CM

## 2015-05-09 DIAGNOSIS — I4891 Unspecified atrial fibrillation: Secondary | ICD-10-CM | POA: Diagnosis not present

## 2015-05-09 LAB — BASIC METABOLIC PANEL
ANION GAP: 8 (ref 5–15)
BUN: 14 mg/dL (ref 6–20)
CO2: 24 mmol/L (ref 22–32)
Calcium: 8.8 mg/dL — ABNORMAL LOW (ref 8.9–10.3)
Chloride: 104 mmol/L (ref 101–111)
Creatinine, Ser: 0.98 mg/dL (ref 0.61–1.24)
GFR calc Af Amer: >60 mL/min (ref >60)
Glucose, Bld: 99 mg/dL (ref 65–99)
POTASSIUM: 3.6 mmol/L (ref 3.5–5.1)
SODIUM: 136 mmol/L (ref 135–145)

## 2015-05-09 LAB — CBC
HCT: 43.6 % (ref 39.0–52.0)
Hemoglobin: 14.5 g/dL (ref 13.0–17.0)
MCH: 31.4 pg (ref 26.0–34.0)
MCHC: 33.3 g/dL (ref 30.0–36.0)
MCV: 94.4 fL (ref 78.0–100.0)
PLATELETS: 219 10*3/uL (ref 150–400)
RBC: 4.62 MIL/uL (ref 4.22–5.81)
RDW: 13.9 % (ref 11.5–15.5)
WBC: 8.2 10*3/uL (ref 4.0–10.5)

## 2015-05-09 LAB — MAGNESIUM: MAGNESIUM: 2.2 mg/dL (ref 1.7–2.4)

## 2015-05-09 MED ORDER — DILTIAZEM HCL ER COATED BEADS 120 MG PO CP24: 120.0000 mg | ORAL_CAPSULE | Freq: Every day | ORAL | Status: DC

## 2015-05-09 MED ORDER — DILTIAZEM HCL ER COATED BEADS 120 MG PO CP24
120.0000 mg | ORAL_CAPSULE | Freq: Every day | ORAL | Status: DC
  Administered 2015-05-09: 120 mg via ORAL
  Filled 2015-05-09: qty 1

## 2015-05-09 NOTE — Consult Note (Signed)
CONSULTATION NOTE  Reason for Consult: Paroxysmal atrial fibrillation  Requesting Physician: Dr. Carles Collet  Cardiologist: Dr. Debara Pickett  HPI: This is a 59 y.o. male previously followed by Dr. Rex Kras with a history of AFib remotely in the past; was associated with sleep apnea. Once he went on treatment with CPAP his AFib has since disappeared. He has done fairly well, although continues to smoke. He injured his shoulder recently and had shoulder surgery. He was last seen in the office in 06/2014 for elevated blood pressure. He was sent for a repeat OSA assessment for new equipment and saw Dr. Ellouise Newer in 12/2014 - he is now on BIPAP at 20/16 with auto-titration. He is 100% compliant.  I increased his Toprol XL to 50 mg am and 25 mg QPM as needed. He is noted to have an EF of 60-65%, mild aortic stenosis and insufficiency. The aortic root is dilated to 4.1 cm.   He is now admitted with PAF. This was associated with epigastric and substernal chest pressure. He was given Aspirin and diltiazem on admission and broke out of a-fib overnight and has remained in sinus rhythm. His CHADSVASC score is 1. Troponins have been negative overnight. Cardiology is asked to evaluate for anticoagulation recommendations.   PMHx:  Past Medical History  Diagnosis Date  . Allergy     RHINITIS  . Hyperlipidemia   . Rosacea   . Smoker   . GERD (gastroesophageal reflux disease)     "not really"  . PAF (paroxysmal atrial fibrillation)     associated with OSA  . Sleep apnea     CPAP  . Shortness of breath     With activity  . Hypertension     takes Diovan and Toprol  . Arthritis     thumb  . History of echocardiogram 03/29/2009    EF 50-55%; mild concentric LVH;   . History of nuclear stress test 08/28/2007    moderate diaphragmatic attenuation; EKG neg for ischemia   . Systolic murmur   . Right bundle branch block    Past Surgical History  Procedure Laterality Date  . Shoulder closed reduction     Procedure: CLOSED REDUCTION SHOULDER;  Surgeon: Lawrence Santiago [facility];  Location: [place];  Service: Orthopedics;  Laterality: Right;  . Finger surgery      Reconstruction of Left thumb and L index finger.  Reconstruction   . Tonsillectomy      as a child  . Knee arthroscopy    . Shoulder open rotator cuff repair  08/11/2011    Procedure: ROTATOR CUFF REPAIR SHOULDER OPEN;  Surgeon: Willa Frater III;  Location: [place];  Service: Orthopedics;  Laterality: Right;    FAMHx: Family History  Problem Relation Age of Onset  . Cancer - Colon Father   . Cancer Paternal Uncle   . Anesthesia problems Neg Hx   . Cancer - Prostate Paternal Grandfather   . Cancer - Prostate Maternal Grandfather   . Heart attack Father 70  . Stroke Neg Hx   . Heart disease Mother     SOCHx:  reports that he has been smoking Cigarettes.  He has a 30 pack-year smoking history. He has never used smokeless tobacco. He reports that he drinks about 1.2 - 1.8 oz of alcohol per week. He reports that he does not use illicit drugs.  ALLERGIES: Allergies  Allergen Reactions  . Codeine Shortness Of Breath and Swelling  . Morphine And Related Shortness Of Breath  and Swelling    ROS: A comprehensive review of systems was negative except for: Cardiovascular: positive for palpitations  HOME MEDICATIONS: Prescriptions prior to admission  Medication Sig Dispense Refill Last Dose  . ALPRAZolam (XANAX) 1 MG tablet Take 1 tablet (1 mg total) by mouth at bedtime as needed. sleep 30 tablet 1 05/08/2015 at Unknown time  . aspirin EC 81 MG tablet Take 81 mg by mouth daily.    05/08/2015 at Unknown time  . metoprolol succinate (TOPROL-XL) 50 MG 24 hr tablet Take 1 tablet (50 mg total) by mouth daily. May take an extra 65m daily as needed. 60 tablet 11 05/08/2015 at 1000  . naproxen sodium (ANAPROX) 220 MG tablet Take 440 mg by mouth 2 (two) times daily with a meal.   05/07/2015 at Unknown time  . valsartan-hydrochlorothiazide  (DIOVAN HCT) 160-25 MG per tablet Take 1 tablet by mouth daily. 90 tablet 3 05/07/2015 at Unknown time    HOSPITAL MEDICATIONS: I have reviewed the patient's current medications.  VITALS: Blood pressure 143/32, pulse 67, temperature 98.1 F (36.7 C), temperature source Axillary, resp. rate 18, height '6\' 4"'  (1.93 m), weight 418 lb 14.4 oz (190.012 kg), SpO2 95 %.  PHYSICAL EXAM: General appearance: alert, no distress and morbidly obese Neck: no carotid bruit and no JVD Lungs: clear to auscultation bilaterally Heart: regular rate and rhythm, S1, S2 normal, no murmur, click, rub or gallop Abdomen: soft, non-tender; bowel sounds normal; no masses,  no organomegaly Extremities: varicose veins noted and venous stasis dermatitis noted Pulses: 2+ and symmetric Skin: Skin color, texture, turgor normal. No rashes or lesions Neurologic: Grossly normal Psych: Pleasant  LABS: Results for orders placed or performed during the hospital encounter of 05/07/15 (from the past 48 hour(s))  Basic metabolic panel     Status: Abnormal   Collection Time: 05/07/15 11:15 PM  Result Value Ref Range   Sodium 132 (L) 135 - 145 mmol/L   Potassium 3.6 3.5 - 5.1 mmol/L   Chloride 100 (L) 101 - 111 mmol/L   CO2 21 (L) 22 - 32 mmol/L   Glucose, Bld 97 65 - 99 mg/dL   BUN 20 6 - 20 mg/dL   Creatinine, Ser 1.14 0.61 - 1.24 mg/dL   Calcium 9.1 8.9 - 10.3 mg/dL   GFR calc non Af Amer >60 >60 mL/min   GFR calc Af Amer >60 >60 mL/min    Comment: (NOTE) The eGFR has been calculated using the CKD EPI equation. This calculation has not been validated in all clinical situations. eGFR's persistently <60 mL/min signify possible Chronic Kidney Disease.    Anion gap 11 5 - 15  CBC     Status: None   Collection Time: 05/07/15 11:15 PM  Result Value Ref Range   WBC 10.5 4.0 - 10.5 K/uL   RBC 4.97 4.22 - 5.81 MIL/uL   Hemoglobin 16.0 13.0 - 17.0 g/dL   HCT 46.3 39.0 - 52.0 %   MCV 93.2 78.0 - 100.0 fL   MCH 32.2  26.0 - 34.0 pg   MCHC 34.6 30.0 - 36.0 g/dL   RDW 13.7 11.5 - 15.5 %   Platelets 250 150 - 400 K/uL  I-stat troponin, ED     Status: None   Collection Time: 05/08/15 12:04 AM  Result Value Ref Range   Troponin i, poc 0.01 0.00 - 0.08 ng/mL   Comment 3            Comment: Due to the release  kinetics of cTnI, a negative result within the first hours of the onset of symptoms does not rule out myocardial infarction with certainty. If myocardial infarction is still suspected, repeat the test at appropriate intervals.   Lipase, blood     Status: None   Collection Time: 05/08/15 12:32 AM  Result Value Ref Range   Lipase 24 22 - 51 U/L  Hepatic function panel     Status: None   Collection Time: 05/08/15 12:32 AM  Result Value Ref Range   Total Protein 6.9 6.5 - 8.1 g/dL   Albumin 3.7 3.5 - 5.0 g/dL   AST 30 15 - 41 U/L   ALT 31 17 - 63 U/L   Alkaline Phosphatase 64 38 - 126 U/L   Total Bilirubin 0.9 0.3 - 1.2 mg/dL   Bilirubin, Direct 0.2 0.1 - 0.5 mg/dL   Indirect Bilirubin 0.7 0.3 - 0.9 mg/dL  MRSA PCR Screening     Status: None   Collection Time: 05/08/15  3:00 AM  Result Value Ref Range   MRSA by PCR NEGATIVE NEGATIVE    Comment:        The GeneXpert MRSA Assay (FDA approved for NASAL specimens only), is one component of a comprehensive MRSA colonization surveillance program. It is not intended to diagnose MRSA infection nor to guide or monitor treatment for MRSA infections.   Troponin I (q 6hr x 3)     Status: None   Collection Time: 05/08/15  4:50 AM  Result Value Ref Range   Troponin I 0.03 <0.031 ng/mL    Comment:        NO INDICATION OF MYOCARDIAL INJURY.   TSH     Status: None   Collection Time: 05/08/15  4:50 AM  Result Value Ref Range   TSH 2.889 0.350 - 4.500 uIU/mL  Troponin I (q 6hr x 3)     Status: None   Collection Time: 05/08/15 10:59 AM  Result Value Ref Range   Troponin I <0.03 <0.031 ng/mL    Comment:        NO INDICATION OF MYOCARDIAL  INJURY.   Troponin I (q 6hr x 3)     Status: None   Collection Time: 05/08/15  3:35 PM  Result Value Ref Range   Troponin I <0.03 <0.031 ng/mL    Comment:        NO INDICATION OF MYOCARDIAL INJURY.   CBC     Status: None   Collection Time: 05/09/15  3:50 AM  Result Value Ref Range   WBC 8.2 4.0 - 10.5 K/uL   RBC 4.62 4.22 - 5.81 MIL/uL   Hemoglobin 14.5 13.0 - 17.0 g/dL   HCT 43.6 39.0 - 52.0 %   MCV 94.4 78.0 - 100.0 fL   MCH 31.4 26.0 - 34.0 pg   MCHC 33.3 30.0 - 36.0 g/dL   RDW 13.9 11.5 - 15.5 %   Platelets 219 150 - 400 K/uL  Basic metabolic panel     Status: Abnormal   Collection Time: 05/09/15  3:50 AM  Result Value Ref Range   Sodium 136 135 - 145 mmol/L   Potassium 3.6 3.5 - 5.1 mmol/L   Chloride 104 101 - 111 mmol/L   CO2 24 22 - 32 mmol/L   Glucose, Bld 99 65 - 99 mg/dL   BUN 14 6 - 20 mg/dL   Creatinine, Ser 0.98 0.61 - 1.24 mg/dL   Calcium 8.8 (L) 8.9 - 10.3 mg/dL   GFR  calc non Af Amer >60 >60 mL/min   GFR calc Af Amer >60 >60 mL/min    Comment: (NOTE) The eGFR has been calculated using the CKD EPI equation. This calculation has not been validated in all clinical situations. eGFR's persistently <60 mL/min signify possible Chronic Kidney Disease.    Anion gap 8 5 - 15  Magnesium     Status: None   Collection Time: 05/09/15  3:50 AM  Result Value Ref Range   Magnesium 2.2 1.7 - 2.4 mg/dL    IMAGING: Dg Chest Portable 1 View  05/08/2015   CLINICAL DATA:  Chest pain and shortness of breath today.  EXAM: PORTABLE CHEST - 1 VIEW  COMPARISON:  04/29/2012  FINDINGS: The heart is enlarged. There are diffusely increased interstitial opacities. No confluent airspace disease. There is no pleural effusion or pneumothorax. No acute osseous abnormalities.  IMPRESSION: Cardiomegaly. Chronically increased interstitial opacities, unchanged allowing for differences in technique.   Electronically Signed   By: Jeb Levering M.D.   On: 05/08/2015 01:02    HOSPITAL  DIAGNOSES: Principal Problem:   Atrial fibrillation with RVR Active Problems:   PAF (paroxysmal atrial fibrillation)   OSA treated with BiPAP   HTN (hypertension)   Morbid obesity   Cigarette nicotine dependence with withdrawal   Allergic rhinitis   Chest pain   Tobacco abuse   IMPRESSION: 1. Paroxysmal atrial fibrillation - CHADSVASC score of 1 2. Essential hypertension 3. Obstructive sleep apnea on BiPAP 4. Tobacco abuse  RECOMMENDATION: 1. Mr. Reicher has recurrent paroxysmal atrial fibrillation with a low CHADSVASC score of 1. It is been many years since he had recurrent atrial fibrillation. Is done fairly well with beta blockers but did have good rate control with diltiazem. I agree with adding long acting diltiazem to his current regimen. Given his low CHADSVASC score, aspirin is reasonable especially given younger age and the low burden of recurrence. He has measured 100% compliance with BiPAP. He does need to continue to work on smoking cessation. Troponins have been negative although he did have significant chest discomfort while in atrial fibrillation. I will arrange an outpatient Lexiscan Myoview and follow-up appointment with me afterwards.  I'm okay with discharge from a cardiology standpoint today.  Thanks for contacting me for a consultation.  Time Spent Directly with Patient: 45 minutes  [name], MD, [facility] Attending Cardiologist [place] 05/09/2015, 9:53 AM

## 2015-05-09 NOTE — Progress Notes (Signed)
  RD consulted for nutrition education.   Body mass index is 51.01 kg/(m^2). Pt meets criteria for Morbid Obesity based on current BMI.  RD met with patient to assess nutrition education needs and offer support. 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 He makes an effort to choose healthier options such as oatmeal, fruit, english muffins, grilled chicken wraps, and salads. He avoids drinking soda and sugary beverages. He makes an effort to eat healthy but, sometimes the only food available is pizza or chinese food. Pt declined any nutrition education needs at this time, stating he is knowledgeable about what is healthy but, he has his challenges. RD encouraged packing healthy snacks to provide satiety while working.  RD offered handouts which patient accepted; provided "Tips for Eating on the Run" handout and "Healthy Snacks for Adults and Teens" handout from the Academy of Nutrition and Dietetics.  Affirmed and encouraged healthy habits that patient has already established.   Current diet order is Heart Healthy, patient is consuming approximately 100% of meals at this time. Labs and medications reviewed. No further nutrition interventions warranted at this time. RD contact information provided. If additional nutrition issues arise, please re-consult RD.  Scarlette Ar RD, LDN Inpatient Clinical Dietitian Pager: [phone] After Hours Pager: [phone]

## 2015-05-09 NOTE — Progress Notes (Signed)
Discharge instructions given and all questions answered, pt stated he understood instructions. Pt stated he would get his PNA and Flu vaccines at [facility].

## 2015-05-09 NOTE — Discharge Summary (Addendum)
Physician Discharge Summary  David [name] AVW:[id number] DOB: [birth year] DOA: 05/07/2015  PCP: David Haste, MD  Admit date: 05/07/2015 Discharge date: 05/09/2015  Recommendations for Outpatient Follow-up:  1. Pt will need to follow up with PCP in 1-2 weeks post discharge 2. Please obtain BMP in 1 week  Discharge Diagnoses:  Paroxysmal atrial fibrillation -CHADS-VASc =1 -Likely due to his continued obstructive sleep apnea and weight gain -The patient has gained 20 pounds in the past 8 months. -Spontaneously converted back to sinus rhythm and remained in sinus -Continue aspirin 81 mg daily -Continue metoprolol succinate -Continue diltiazem--convert to CD 120mg  daily -Recheck echocardiogram--EF 60-65%, moderate aortic stenosis, nl LA, RV -d/c heparin drip -cardiology was consulted as pt was previously on coumading 6 yrs ago--agreed with continuing ASA and cardizem CD;  Will arrange outpt myoview--discussed with Dr. Debara Pickett Atypical chest pain -EKG unchanged -troponins neg x 3 Obstructive sleep apnea -Recent sleep study May 2016--no changes in his settings -Continue CPAP during the hospitalization Lower extremity edema -Patient has had a history of left saphenous vein reflux/insufficiency resulting in chronic left lower extremity edema -Recheck venous duplex to rule out DVT--negative tobacco abuse  -30-pack-year history  -NicoDerm patch  -Tobacco cessation discussed Chronic right bundle branch block -No significant changes on EKG Morbid obesity -BMI 49.6 -Has gained 20+ pounds in the past 8 months  -Discussed importance of lifestyle modification -consult dietician  Discharge Condition: stable  Disposition:  Follow-up Information    Follow up with [name], MD.   Specialty:  Cardiology   Why:  we will call with stress test appointment and follow-up appointment   Contact information:   [address] [place] [address] [phone]     home  Diet:heart healthy Wt Readings from Last 3 Encounters:  05/09/15 190.012 kg (418 lb 14.4 oz)  01/10/15 183.208 kg (403 lb 14.4 oz)  11/12/14 158.759 kg (350 lb)    History of present illness:   59 year old male with a history of morbid obesity, paroxysmal atrial fibrillation, hypertension presented with palpitations, substernal chest discomfort that began around 9:30 PM on 05/07/2015. He denies any dizziness, diaphoresis, nausea. He has been compliant with all his medications. He has been 100% compliant with his CPAP. There has been no recent changes in his medications. Upon evaluation in the emergency department, the patient was found to have atrial fibrillation with RVR. He was started on diltiazem drip. The patient spontaneously converted back to sinus rhythm. He was converted to oral diltiazem which was transitioned to long-acting formulation. The patient remained in sinus rhythm and hemodynamically stable. Cardiology was consulted as he was previously on warfarin when he had atrial fibrillation 6 years ago. Consultants: cardiology  Discharge Exam: Filed Vitals:   05/09/15 0937  BP: 143/32  Pulse: 67  Temp:   Resp:    Filed Vitals:   05/09/15 0449 05/09/15 0529 05/09/15 0552 05/09/15 0937  BP: 128/57   143/32  Pulse: 65   67  Temp: 98.1 F (36.7 C)     TempSrc: Axillary     Resp: 18     Height:      Weight:  192.5 kg (424 lb 6.2 oz) 190.012 kg (418 lb 14.4 oz)   SpO2: 95%      General: A&O x 3, NAD, pleasant, cooperative Cardiovascular: RRR, no rub, no gallop, no S3 Respiratory: CTAB, no wheeze, no rhonchi Abdomen:soft, nontender, nondistended, positive bowel sounds Extremities: 2+ edema, No lymphangitis, no petechiae  Discharge Instructions  Discharge Instructions    Diet - low sodium heart healthy    Complete by:  As directed      Increase activity slowly    Complete by:  As directed             Medication List    STOP taking these medications         naproxen sodium 220 MG tablet  Commonly known as:  ANAPROX     valsartan-hydrochlorothiazide 160-25 MG per tablet  Commonly known as:  DIOVAN HCT      TAKE these medications        ALPRAZolam 1 MG tablet  Commonly known as:  XANAX  Take 1 tablet (1 mg total) by mouth at bedtime as needed. sleep     aspirin EC 81 MG tablet  Take 81 mg by mouth daily.     diltiazem 120 MG 24 hr capsule  Commonly known as:  CARDIZEM CD  Take 1 capsule (120 mg total) by mouth daily.     metoprolol succinate 50 MG 24 hr tablet  Commonly known as:  TOPROL-XL  Take 1 tablet (50 mg total) by mouth daily. May take an extra 25mg  daily as needed.         The results of significant diagnostics from this hospitalization (including imaging, microbiology, ancillary and laboratory) are listed below for reference.    Significant Diagnostic Studies: Dg Chest Portable 1 View  05/08/2015   CLINICAL DATA:  Chest pain and shortness of breath today.  EXAM: PORTABLE CHEST - 1 VIEW  COMPARISON:  04/29/2012  FINDINGS: The heart is enlarged. There are diffusely increased interstitial opacities. No confluent airspace disease. There is no pleural effusion or pneumothorax. No acute osseous abnormalities.  IMPRESSION: Cardiomegaly. Chronically increased interstitial opacities, unchanged allowing for differences in technique.   Electronically Signed   By: Jeb Levering M.D.   On: 05/08/2015 01:02     Microbiology: Recent Results (from the past 240 hour(s))  MRSA PCR Screening     Status: None   Collection Time: 05/08/15  3:00 AM  Result Value Ref Range Status   MRSA by PCR NEGATIVE NEGATIVE Final    Comment:        The GeneXpert MRSA Assay (FDA approved for NASAL specimens only), is one component of a comprehensive MRSA colonization surveillance program. It is not intended to diagnose MRSA infection nor to guide or monitor treatment for MRSA infections.      Labs: Basic Metabolic Panel:  Recent  Labs Lab 05/07/15 2315 05/09/15 0350  NA 132* 136  K 3.6 3.6  CL 100* 104  CO2 21* 24  GLUCOSE 97 99  BUN 20 14  CREATININE 1.14 0.98  CALCIUM 9.1 8.8*  MG  --  2.2   Liver Function Tests:  Recent Labs Lab 05/08/15 0032  AST 30  ALT 31  ALKPHOS 64  BILITOT 0.9  PROT 6.9  ALBUMIN 3.7    Recent Labs Lab 05/08/15 0032  LIPASE 24   No results for input(s): AMMONIA in the last 168 hours. CBC:  Recent Labs Lab 05/07/15 2315 05/09/15 0350  WBC 10.5 8.2  HGB 16.0 14.5  HCT 46.3 43.6  MCV 93.2 94.4  PLT 250 219   Cardiac Enzymes:  Recent Labs Lab 05/08/15 0450 05/08/15 1059 05/08/15 1535  TROPONINI 0.03 <0.03 <0.03   BNP: Invalid input(s): POCBNP CBG: No results for input(s): GLUCAP in the last 168 hours.  Time coordinating discharge:  Greater than 30  minutes  Signed:  TAT, Salvador, DO [facility] Pager: [phone] 05/09/2015, 10:33 AM

## 2015-05-10 ENCOUNTER — Telehealth (HOSPITAL_COMMUNITY): Payer: Self-pay

## 2015-05-10 ENCOUNTER — Telehealth: Payer: Self-pay | Admitting: Family Medicine

## 2015-05-10 NOTE — Telephone Encounter (Signed)
Encounter complete. 

## 2015-05-10 NOTE — Telephone Encounter (Signed)
Pt was called concerning recent hospital visit. When asked pt stated that he was doing ok. He stated that he believes the whole episode was stress related. Pt was asked about medications and he stated that he continues on Toprol and was given a new medication at the hospital, Cardizem. He states that so far he is not having any issues with it. Pt states that he did have his medications filled. Pt was asked to make a hospital follow up appt and he stated that he was on his way to work and would call back to schedule. Pt was reminded that the hospital recommend he see his PCP and pt again stated he would call back and make an appt.

## 2015-05-11 ENCOUNTER — Ambulatory Visit (HOSPITAL_COMMUNITY): Admit: 2015-05-11 | Payer: 59

## 2015-05-12 ENCOUNTER — Telehealth (HOSPITAL_COMMUNITY): Payer: Self-pay | Admitting: Radiology

## 2015-05-12 ENCOUNTER — Inpatient Hospital Stay (HOSPITAL_COMMUNITY): Admit: 2015-05-12 | Payer: BC Managed Care – PPO

## 2015-05-12 NOTE — Telephone Encounter (Signed)
Encounter complete. 

## 2015-05-16 ENCOUNTER — Ambulatory Visit: Payer: BC Managed Care – PPO | Admitting: Physician Assistant

## 2015-05-17 ENCOUNTER — Ambulatory Visit (HOSPITAL_COMMUNITY)
Admission: RE | Admit: 2015-05-17 | Discharge: 2015-05-17 | Disposition: A | Discharge status: Home / Self Care | Payer: 59 | Source: Ambulatory Visit | Attending: Internal Medicine | Admitting: Internal Medicine

## 2015-05-17 DIAGNOSIS — I10 Essential (primary) hypertension: Secondary | ICD-10-CM | POA: Diagnosis not present

## 2015-05-17 DIAGNOSIS — R0609 Other forms of dyspnea: Secondary | ICD-10-CM | POA: Insufficient documentation

## 2015-05-17 DIAGNOSIS — E669 Obesity, unspecified: Secondary | ICD-10-CM | POA: Diagnosis not present

## 2015-05-17 DIAGNOSIS — R072 Precordial pain: Secondary | ICD-10-CM | POA: Diagnosis not present

## 2015-05-17 DIAGNOSIS — I480 Paroxysmal atrial fibrillation: Secondary | ICD-10-CM | POA: Diagnosis not present

## 2015-05-17 DIAGNOSIS — Z6841 Body Mass Index (BMI) 40.0 and over, adult: Secondary | ICD-10-CM | POA: Diagnosis not present

## 2015-05-17 DIAGNOSIS — Z8249 Family history of ischemic heart disease and other diseases of the circulatory system: Secondary | ICD-10-CM | POA: Insufficient documentation

## 2015-05-17 DIAGNOSIS — F17200 Nicotine dependence, unspecified, uncomplicated: Secondary | ICD-10-CM | POA: Insufficient documentation

## 2015-05-17 DIAGNOSIS — R079 Chest pain, unspecified: Secondary | ICD-10-CM | POA: Insufficient documentation

## 2015-05-17 DIAGNOSIS — R002 Palpitations: Secondary | ICD-10-CM | POA: Insufficient documentation

## 2015-05-17 DIAGNOSIS — I4510 Unspecified right bundle-branch block: Secondary | ICD-10-CM | POA: Diagnosis not present

## 2015-05-17 DIAGNOSIS — G4733 Obstructive sleep apnea (adult) (pediatric): Secondary | ICD-10-CM | POA: Diagnosis not present

## 2015-05-17 DIAGNOSIS — Z720 Tobacco use: Secondary | ICD-10-CM

## 2015-05-17 MED ORDER — TECHNETIUM TC 99M SESTAMIBI GENERIC - CARDIOLITE
31.7000 | Freq: Once | INTRAVENOUS | Status: AC | PRN
  Administered 2015-05-17: 31.7 via INTRAVENOUS

## 2015-05-17 MED ORDER — REGADENOSON 0.4 MG/5ML IV SOLN
0.4000 mg | Freq: Once | INTRAVENOUS | Status: AC
  Administered 2015-05-17: 0.4 mg via INTRAVENOUS

## 2015-05-18 ENCOUNTER — Ambulatory Visit (HOSPITAL_COMMUNITY): Payer: 59

## 2015-05-18 ENCOUNTER — Ambulatory Visit (HOSPITAL_COMMUNITY)
Admission: RE | Admit: 2015-05-18 | Discharge: 2015-05-18 | Disposition: A | Discharge status: Home / Self Care | Payer: 59 | Source: Ambulatory Visit | Attending: Urology | Admitting: Urology

## 2015-05-18 LAB — MYOCARDIAL PERFUSION IMAGING
CHL CUP NUCLEAR SSS: 12
CHL CUP RESTING HR STRESS: 68 {beats}/min
LV dias vol: 43 mL
LVSYSVOL: 106 mL
NUC STRESS TID: 0.5
Peak HR: 78 {beats}/min
SDS: 9
SRS: 3

## 2015-05-18 MED ORDER — TECHNETIUM TC 99M SESTAMIBI GENERIC - CARDIOLITE
31.7000 | Freq: Once | INTRAVENOUS | Status: AC | PRN
  Administered 2015-05-18: 31.7 via INTRAVENOUS

## 2015-05-26 ENCOUNTER — Encounter: Payer: Self-pay | Admitting: Internal Medicine

## 2015-05-26 ENCOUNTER — Ambulatory Visit (INDEPENDENT_AMBULATORY_CARE_PROVIDER_SITE_OTHER): Payer: 59 | Admitting: Internal Medicine

## 2015-05-26 VITALS — BP 130/62 | HR 80 | Ht 76.0 in | Wt >= 6400 oz

## 2015-05-26 DIAGNOSIS — I719 Aortic aneurysm of unspecified site, without rupture: Secondary | ICD-10-CM

## 2015-05-26 DIAGNOSIS — I4510 Unspecified right bundle-branch block: Secondary | ICD-10-CM

## 2015-05-26 DIAGNOSIS — I872 Venous insufficiency (chronic) (peripheral): Secondary | ICD-10-CM

## 2015-05-26 DIAGNOSIS — G4733 Obstructive sleep apnea (adult) (pediatric): Secondary | ICD-10-CM | POA: Diagnosis not present

## 2015-05-26 DIAGNOSIS — I480 Paroxysmal atrial fibrillation: Secondary | ICD-10-CM

## 2015-05-26 NOTE — Patient Instructions (Signed)
Dr. Debara Pickett recommends that you purchase a compression stocking for your left leg and wear this for 90 days  Dr. Debara Pickett has referred you to Dr. Donnetta Hutching for you leg  Your physician wants you to follow-up in: 1 year with Dr. Debara Pickett. You will receive a reminder letter in the mail two months in advance. If you don't receive a letter, please call our office to schedule the follow-up appointment.

## 2015-05-26 NOTE — Progress Notes (Signed)
OFFICE NOTE  Chief Complaint:  Follow-up stress test  Primary Care Physician: David Haste, MD  HPI:  David [name] is a 59 year old gentleman previously followed by Dr. Rex [name] with a history of AFib remotely in the past; was associated with sleep apnea. Once he went on treatment with CPAP his AFib has since disappeared. He has done fairly well, although continues to smoke. He injured his shoulder recently and had shoulder surgery. He follows up in the office today and reports that his blood pressure is been spiking recently. He's had a take an extra half of Toprol to help with his blood pressure in the afternoon. He has also gained about 20 pounds of weight back. He says that his sleep apnea mask is malfunctioning and that he feels he needs new equipment. His sleep study was in 2010 therefore he is well overdue for a repeat sleep apneas assessment. He has a stable right bundle branch block. There is no evidence for recurrent atrial fibrillation. He occasionally gets palpitations however they're short-lived and he says that they're nothing like the A. Fib he had before. When I previously saw him we had ordered a venous insufficiency study of the left leg due to swelling and venous stasis changes. This demonstrated reflux in the left greater saphenous vein which is significant and could be contributing to his symptoms.  David [name] returns today for follow-up of his stress test. As demonstrated no ischemia with an EF of 60%. We did make a small change in increasing his Toprol-XL for an additional 25 mg daily at bedtime. He says he's taking that may be 3 or 4 days a week. Generally his blood pressure control is been better. He says that work is died down somewhat and that his stress levels improved. He now attributes that to his symptoms. Having problems with left lower extremity swelling secondary to venous insufficiency. He wants to address this but does not feel that he has the money  now to undergo a venous ablation procedure.  PMHx:  Past Medical History  Diagnosis Date  . Allergy     RHINITIS  . Hyperlipidemia   . Rosacea   . Smoker   . GERD (gastroesophageal reflux disease)     "not really"  . PAF (paroxysmal atrial fibrillation)     associated with OSA  . Sleep apnea     CPAP  . Shortness of breath     With activity  . Hypertension     takes Diovan and Toprol  . Arthritis     thumb  . History of echocardiogram 03/29/2009    EF 50-55%; mild concentric LVH;   . History of nuclear stress test 08/28/2007    moderate diaphragmatic attenuation; EKG neg for ischemia   . Systolic murmur   . Right bundle branch block     Past Surgical History  Procedure Laterality Date  . Shoulder closed reduction      Procedure: CLOSED REDUCTION SHOULDER;  Surgeon: David [name];  Location: [place];  Service: Orthopedics;  Laterality: Right;  . Finger surgery      Reconstruction of Left thumb and L index finger.  Reconstruction   . Tonsillectomy      as a child  . Knee arthroscopy    . Shoulder open rotator cuff repair  08/11/2011    Procedure: ROTATOR CUFF REPAIR SHOULDER OPEN;  Surgeon: David [name];  Location: [place];  Service: Orthopedics;  Laterality: Right;    FAMHx:  Family History  Problem Relation Age of Onset  . Cancer - Colon Father   . Cancer Paternal Uncle   . Anesthesia problems Neg Hx   . Cancer - Prostate Paternal Grandfather   . Cancer - Prostate Maternal Grandfather   . Heart attack Father 34  . Stroke Neg Hx   . Heart disease Mother     SOCHx:   reports that he has been smoking Cigarettes.  He has a 30 pack-year smoking history. He has never used smokeless tobacco. He reports that he drinks about 1.2 - 1.8 oz of alcohol per week. He reports that he does not use illicit drugs.  ALLERGIES:  Allergies  Allergen Reactions  . Codeine Shortness Of Breath and Swelling  . Morphine And Related Shortness Of Breath and Swelling     ROS: A comprehensive review of systems was negative except for: Cardiovascular: positive for lower extremity edema  HOME MEDS: Current Outpatient Prescriptions  Medication Sig Dispense Refill  . ALPRAZolam (XANAX) 1 MG tablet Take 1 tablet (1 mg total) by mouth at bedtime as needed. sleep 30 tablet 1  . aspirin EC 81 MG tablet Take 81 mg by mouth daily.     Marland Kitchen diltiazem (CARDIZEM CD) 120 MG 24 hr capsule Take 1 capsule (120 mg total) by mouth daily. 30 capsule 1  . metoprolol succinate (TOPROL-XL) 50 MG 24 hr tablet Take 1 tablet (50 mg total) by mouth daily. May take an extra 25mg  daily as needed. 60 tablet 11  . valsartan-hydrochlorothiazide (DIOVAN-HCT) 160-25 MG tablet Take 1 tablet by mouth daily.     No current facility-administered medications for this visit.    LABS/IMAGING: No results found for this or any previous visit (from the past 48 hour(s)). No results found.  VITALS: BP 130/62 mmHg  Pulse 80  Ht 6\' 4"  (1.93 m)  Wt 410 lb 11.2 oz (186.292 kg)  BMI 50.01 kg/m2  EXAM: Deferred  EKG: Deferred  ASSESSMENT: 1. Paroxysmal atrial fibrillation 2. Obstructive sleep apnea on BIPAP 3. Hypertension 4. Mild aortic stenosis/regurgitation, EF 60-65% 5. Right bundle-branch block 6. Mild aortic root dilatation 7. Morbid obesity 8. Symptomatic reflux of the left greater saphenous vein  PLAN: 1.   David [name] seems to be doing well. His stress test was negative for ischemia. Chest pain has resolved. This may been related to stress. He still very symptomatic with his left leg and swelling. A venous insufficiency study showed symptomatic reflux of the left greater saphenous vein. I discussed the possibility of vein ablation and he is willing to consider it. For now I would recommend fitting a compression stocking, 20-30 mmHg and wearing that regularly. If he should be interested in it further I would recommend a referral to David [name] with [facility].  Plan to see him back  annually.  David Casino, MD, [facility] Attending Cardiologist [place] 05/26/2015, 5:12 PM

## 2015-06-16 ENCOUNTER — Emergency Department (HOSPITAL_COMMUNITY): Payer: 59

## 2015-06-16 ENCOUNTER — Observation Stay (HOSPITAL_COMMUNITY)
Admission: EM | Admit: 2015-06-16 | Discharge: 2015-06-16 | Disposition: A | Discharge status: Home / Self Care | Payer: 59 | Attending: Internal Medicine | Admitting: Internal Medicine

## 2015-06-16 ENCOUNTER — Encounter (HOSPITAL_COMMUNITY): Payer: Self-pay | Admitting: Emergency Medicine

## 2015-06-16 DIAGNOSIS — Z7982 Long term (current) use of aspirin: Secondary | ICD-10-CM | POA: Diagnosis not present

## 2015-06-16 DIAGNOSIS — Z79899 Other long term (current) drug therapy: Secondary | ICD-10-CM | POA: Insufficient documentation

## 2015-06-16 DIAGNOSIS — I4510 Unspecified right bundle-branch block: Secondary | ICD-10-CM | POA: Insufficient documentation

## 2015-06-16 DIAGNOSIS — F17210 Nicotine dependence, cigarettes, uncomplicated: Secondary | ICD-10-CM | POA: Insufficient documentation

## 2015-06-16 DIAGNOSIS — E785 Hyperlipidemia, unspecified: Secondary | ICD-10-CM | POA: Insufficient documentation

## 2015-06-16 DIAGNOSIS — I350 Nonrheumatic aortic (valve) stenosis: Secondary | ICD-10-CM | POA: Insufficient documentation

## 2015-06-16 DIAGNOSIS — G4733 Obstructive sleep apnea (adult) (pediatric): Secondary | ICD-10-CM | POA: Diagnosis present

## 2015-06-16 DIAGNOSIS — I10 Essential (primary) hypertension: Secondary | ICD-10-CM | POA: Diagnosis not present

## 2015-06-16 DIAGNOSIS — I480 Paroxysmal atrial fibrillation: Principal | ICD-10-CM | POA: Insufficient documentation

## 2015-06-16 DIAGNOSIS — I5031 Acute diastolic (congestive) heart failure: Secondary | ICD-10-CM | POA: Diagnosis not present

## 2015-06-16 DIAGNOSIS — Z87891 Personal history of nicotine dependence: Secondary | ICD-10-CM | POA: Diagnosis present

## 2015-06-16 DIAGNOSIS — Z6841 Body Mass Index (BMI) 40.0 and over, adult: Secondary | ICD-10-CM | POA: Insufficient documentation

## 2015-06-16 DIAGNOSIS — I4891 Unspecified atrial fibrillation: Secondary | ICD-10-CM | POA: Diagnosis present

## 2015-06-16 DIAGNOSIS — Z720 Tobacco use: Secondary | ICD-10-CM

## 2015-06-16 DIAGNOSIS — J810 Acute pulmonary edema: Secondary | ICD-10-CM

## 2015-06-16 DIAGNOSIS — Z23 Encounter for immunization: Secondary | ICD-10-CM | POA: Diagnosis not present

## 2015-06-16 HISTORY — DX: Obesity, unspecified: E66.9

## 2015-06-16 LAB — BRAIN NATRIURETIC PEPTIDE: B NATRIURETIC PEPTIDE 5: 126.3 pg/mL — AB (ref 0.0–100.0)

## 2015-06-16 LAB — BASIC METABOLIC PANEL
Anion gap: 10 (ref 5–15)
BUN: 14 mg/dL (ref 6–20)
CALCIUM: 9.3 mg/dL (ref 8.9–10.3)
CO2: 24 mmol/L (ref 22–32)
Chloride: 100 mmol/L — ABNORMAL LOW (ref 101–111)
Creatinine, Ser: 1.11 mg/dL (ref 0.61–1.24)
Glucose, Bld: 94 mg/dL (ref 65–99)
POTASSIUM: 3.6 mmol/L (ref 3.5–5.1)
Sodium: 134 mmol/L — ABNORMAL LOW (ref 135–145)

## 2015-06-16 LAB — CBC
HEMATOCRIT: 47 % (ref 39.0–52.0)
HEMOGLOBIN: 16.5 g/dL (ref 13.0–17.0)
MCH: 32.5 pg (ref 26.0–34.0)
MCHC: 35.1 g/dL (ref 30.0–36.0)
MCV: 92.7 fL (ref 78.0–100.0)
Platelets: 254 10*3/uL (ref 150–400)
RBC: 5.07 MIL/uL (ref 4.22–5.81)
RDW: 13.5 % (ref 11.5–15.5)
WBC: 9.7 10*3/uL (ref 4.0–10.5)

## 2015-06-16 LAB — PROTIME-INR
INR: 1.09 (ref 0.00–1.49)
PROTHROMBIN TIME: 14.3 s (ref 11.6–15.2)

## 2015-06-16 LAB — I-STAT TROPONIN, ED: TROPONIN I, POC: 0 ng/mL (ref 0.00–0.08)

## 2015-06-16 LAB — TSH: TSH: 4.387 u[IU]/mL (ref 0.350–4.500)

## 2015-06-16 LAB — HEPARIN LEVEL (UNFRACTIONATED): Heparin Unfractionated: 0.13 IU/mL — ABNORMAL LOW (ref 0.30–0.70)

## 2015-06-16 LAB — MAGNESIUM: Magnesium: 1.9 mg/dL (ref 1.7–2.4)

## 2015-06-16 LAB — APTT: aPTT: 32 seconds (ref 24–37)

## 2015-06-16 MED ORDER — HEPARIN (PORCINE) IN NACL 100-0.45 UNIT/ML-% IJ SOLN: 2300.0000 [IU]/h | INTRAMUSCULAR | Status: DC

## 2015-06-16 MED ORDER — VALSARTAN-HYDROCHLOROTHIAZIDE 160-25 MG PO TABS: 1.0000 | ORAL_TABLET | Freq: Every day | ORAL | Status: DC

## 2015-06-16 MED ORDER — METOPROLOL SUCCINATE ER 50 MG PO TB24
50.0000 mg | ORAL_TABLET | Freq: Every day | ORAL | Status: DC
  Administered 2015-06-16: 50 mg via ORAL
  Filled 2015-06-16: qty 1

## 2015-06-16 MED ORDER — HYDROCHLOROTHIAZIDE 25 MG PO TABS
25.0000 mg | ORAL_TABLET | Freq: Every day | ORAL | Status: DC
  Administered 2015-06-16: 25 mg via ORAL
  Filled 2015-06-16: qty 1

## 2015-06-16 MED ORDER — ASPIRIN EC 81 MG PO TBEC
81.0000 mg | DELAYED_RELEASE_TABLET | Freq: Every day | ORAL | Status: DC
  Administered 2015-06-16: 81 mg via ORAL
  Filled 2015-06-16: qty 1

## 2015-06-16 MED ORDER — INFLUENZA VAC SPLIT QUAD 0.5 ML IM SUSY: 0.5000 mL | PREFILLED_SYRINGE | INTRAMUSCULAR | Status: DC

## 2015-06-16 MED ORDER — DILTIAZEM HCL ER COATED BEADS 120 MG PO CP24
120.0000 mg | ORAL_CAPSULE | Freq: Every day | ORAL | Status: DC
  Administered 2015-06-16: 120 mg via ORAL
  Filled 2015-06-16: qty 1

## 2015-06-16 MED ORDER — IRBESARTAN 150 MG PO TABS
150.0000 mg | ORAL_TABLET | Freq: Every day | ORAL | Status: DC
  Administered 2015-06-16: 150 mg via ORAL
  Filled 2015-06-16: qty 1

## 2015-06-16 MED ORDER — HEPARIN BOLUS VIA INFUSION
4000.0000 [IU] | Freq: Once | INTRAVENOUS | Status: AC
  Administered 2015-06-16: 4000 [IU] via INTRAVENOUS
  Filled 2015-06-16: qty 4000

## 2015-06-16 MED ORDER — HEPARIN (PORCINE) IN NACL 100-0.45 UNIT/ML-% IJ SOLN
2000.0000 [IU]/h | INTRAMUSCULAR | Status: DC
  Administered 2015-06-16: 2000 [IU]/h via INTRAVENOUS
  Filled 2015-06-16: qty 250

## 2015-06-16 MED ORDER — ACETAMINOPHEN 325 MG PO TABS: 650.0000 mg | ORAL_TABLET | ORAL | Status: DC | PRN

## 2015-06-16 MED ORDER — ONDANSETRON HCL 4 MG/2ML IJ SOLN: 4.0000 mg | Freq: Four times a day (QID) | INTRAMUSCULAR | Status: DC | PRN

## 2015-06-16 MED ORDER — HEPARIN BOLUS VIA INFUSION
5000.0000 [IU] | Freq: Once | INTRAVENOUS | Status: AC
  Administered 2015-06-16: 5000 [IU] via INTRAVENOUS
  Filled 2015-06-16: qty 5000

## 2015-06-16 MED ORDER — FUROSEMIDE 10 MG/ML IJ SOLN
40.0000 mg | Freq: Once | INTRAMUSCULAR | Status: AC
  Administered 2015-06-16: 40 mg via INTRAVENOUS
  Filled 2015-06-16: qty 4

## 2015-06-16 NOTE — Discharge Summary (Signed)
Discharge Summary   Patient ID: David [name],  MRN: [id number], DOB/AGE: 58-Jun-1957 58 y.o.  Admit date: 06/16/2015 Discharge date: 06/16/2015  Primary Care Provider: Wyatt Haste Primary Cardiologist: Dr. Debara Pickett  Discharge Diagnoses   Atrial fibrillation with RVR ([place])   OSA treated with BiPAP   Vascular Congestion/Volume Overload   HTN (hypertension)   Tobacco abuse   Dyslipidemia   Mild aortic stenosis/regurgitation, EF 60-65%   Mild aortic root dilatation  Allergies Allergies  Allergen Reactions  . Codeine Shortness Of Breath and Swelling  . Morphine And Related Shortness Of Breath and Swelling    Consultant: None  Procedures: None  History of Present Illness  David [name] is a 59 yo man with PMH of paroxysmal atrial fibrillation, OSA on CPAP, dyslipidemia, hypertension who presented midnight 10/26 to the ER with sudden onset palpitations with associated shortness of breath and dyspnea on exertion that began Wednesday (10/26) AM. He endorses some associated chest pain with the faster heart-rate. He took 324 mg asa earlier. There was also some associated diaphoresis. He also endorses similar symptoms 3-4 days ago that lasted 1-2 hours and resolved spontaneously. He is frustrated with the atrial fibrillation and he feels terrible walking around in atrial fibrillation. He has been compliant with his medications and CPAP. He has a stressful job as a Educational psychologist but no change in the stress and he enjoys his job.   He was seen in consultation at recent hospitalization in September by Dr. Debara Pickett and in clinic follow-up 10/6. He has continued to wear his CPAP at home, he endorses 20 lbs of weight gain. He had a negative/low risk nuclear stress test and Echo with EF of 60%, moderate AS and mildly dilated aortic root 4.1 cm. He endorses improved blood pressure at home.   His HR improved in the ER and he remained in atrial fibrillation with HR 80-90s at rest. However, he  continued to feel terrible.   Hospital Course  The patient was admitted and started on IV heparin of anticoagulation. He Kept NPO for possible TEE/DCCV in morning. However he converted to normal sinus rhythm spontaneously. CXR showed vascular congestion with minimal interstitial edema. Given IV lasix 40mg  x 1. Diuresed 172ml. TSH normal. POC trop negative. BNP 126.3. He wanted to go home without any further intervention.   She has been seen by Dr. Martinique  today and deemed ready for discharge home. All follow-up appointments have been scheduled. Discharge medications are listed below.   Has had a total of 3 episodes of AFib in the last month- self terminating. Rate controlled on metoprolol and cardizem. Aggravating factors include morbid obesity, OSA, tobacco abuse, caffeine, and decongestants (recently stopped taking Zytec D). We discussed the possibility of adding AAD therapy. Options are limited. Would avoid 1C drugs with AS. At his age would avoid amiodarone. Tikosyn or Sotalol may be options. At this point will continue rate control and try and eliminate stimulants.  Mali vasc of 1 so will continue ASA.   He was advice to loss weight, be more physical active and stop smoking tobacco.   Discharge Vitals Blood pressure 103/58, pulse 66, temperature 98.4 F (36.9 C), temperature source Oral, resp. rate 17, height 6\' 4"  (1.93 m), weight 409 lb 9.6 oz (185.793 kg), SpO2 97 %.  Filed Weights   06/16/15 0530 06/16/15 0611  Weight: 410 lb (185.975 kg) 409 lb 9.6 oz (185.793 kg)    CBC  Recent Labs  06/16/15 0129  WBC 9.7  HGB 16.5  HCT 47.0  MCV 92.7  PLT 893   Basic Metabolic Panel  Recent Labs  06/16/15 0129 06/16/15 0640  NA 134*  --   K 3.6  --   CL 100*  --   CO2 24  --   GLUCOSE 94  --   BUN 14  --   CREATININE 1.11  --   CALCIUM 9.3  --   MG  --  1.9     Recent Labs  06/16/15 0640  TSH 4.387    Disposition  Pt is being discharged home today in good  condition.  Follow-up Plans & Appointments  Follow-up Information    Follow up with [name], MD. Go on 07/05/2015.   Specialty:  Cardiology   Why:  @10 :00 am    Contact information:   [address] [place] [address] [phone]           Discharge Instructions    Diet - low sodium heart healthy    Complete by:  As directed      Discharge instructions    Complete by:  As directed   Afib Aggravating factors include morbid obesity, OSA, tobacco abuse, caffeine, and decongestants (recently stopped taking Zytec D). Please avoid caffeine, tobacco and decongestants.     Increase activity slowly    Complete by:  As directed            F/u Labs/Studies: None  Discharge Medications    Medication List    TAKE these medications        ALPRAZolam 1 MG tablet  Commonly known as:  XANAX  Take 1 tablet (1 mg total) by mouth at bedtime as needed. sleep     aspirin EC 81 MG tablet  Take 81 mg by mouth daily.     diltiazem 120 MG 24 hr capsule  Commonly known as:  CARDIZEM CD  Take 1 capsule (120 mg total) by mouth daily.     metoprolol succinate 50 MG 24 hr tablet  Commonly known as:  TOPROL-XL  Take 1 tablet (50 mg total) by mouth daily. May take an extra 25mg  daily as needed.     valsartan-hydrochlorothiazide 160-25 MG tablet  Commonly known as:  DIOVAN-HCT  Take 1 tablet by mouth daily.        Duration of Discharge Encounter   Greater than 30 minutes including physician time.  Signed, Jagdeep Ancheta PA-C 06/16/2015, 1:40 PM

## 2015-06-16 NOTE — ED Notes (Signed)
Dr. Yelverton at the bedside.  

## 2015-06-16 NOTE — ED Notes (Signed)
Pt. reports palpitations onset this morning with exertional dyspnea , denies chest pain at arrival , no nausea or diaphoresis . States brief chest pain this afternoon .

## 2015-06-16 NOTE — ED Provider Notes (Signed)
CSN: [id number]     Arrival date & time 06/16/15  0116 History  By signing my name below, I, David [name], attest that this documentation has been prepared under the direction and in the presence of David Rice, MD. Electronically Signed: Eustaquio [name], ED Scribe. 06/16/2015. 2:07 AM.  Chief Complaint  Patient presents with  . Palpitations   The history is provided by the patient. No language interpreter was used.     HPI Comments: David [name] is a 59 y.o. male with hx HLD, PAF, and HTN who presents to the Emergency Department complaining of sudden onset, constant, palpitations that began at 10:45 AM yesterday (approximately 15 hours ago). He reports having constant shortness of breath with dyspnea on exertion today which is unusual for him. He also complains of gradual onset, intermittent, chest pressure that began shortly after the a fib. He notes that the pressure changed to a sharp, stabbing pain twice today. He took 324 mg Aspirin today with some relief. Pt had two episodes of diaphoresis today as well. He is not in pain currently but states he was in pain when he was taken to have his CXR. He denies  increased swelling in legs, fever, chills, or any other associated symptoms. Pt had similar atrial fibrillation approximately 4 days ago which lasted 1.5 hours before resolving on its own.   Past Medical History  Diagnosis Date  . Allergy     RHINITIS  . Hyperlipidemia   . Rosacea   . Smoker   . GERD (gastroesophageal reflux disease)     "not really"  . PAF (paroxysmal atrial fibrillation) ([facility])     associated with OSA  . Sleep apnea     CPAP  . Shortness of breath     With activity  . Hypertension     takes Diovan and Toprol  . Arthritis     thumb  . History of echocardiogram 03/29/2009    EF 50-55%; mild concentric LVH;   . History of nuclear stress test 08/28/2007    moderate diaphragmatic attenuation; EKG neg for ischemia   . Systolic murmur   . Right bundle branch  block   . Obesity    Past Surgical History  Procedure Laterality Date  . Shoulder closed reduction      Procedure: CLOSED REDUCTION SHOULDER;  Surgeon: [facility];  Location: [place];  Service: Orthopedics;  Laterality: Right;  . Finger surgery      Reconstruction of Left thumb and L index finger.  Reconstruction   . Tonsillectomy      as a child  . Knee arthroscopy    . Shoulder open rotator cuff repair  08/11/2011    Procedure: ROTATOR CUFF REPAIR SHOULDER OPEN;  Surgeon: Willa Frater III;  Location: [place];  Service: Orthopedics;  Laterality: Right;   Family History  Problem Relation Age of Onset  . Cancer - Colon Father   . Cancer Paternal Uncle   . Anesthesia problems Neg Hx   . Cancer - Prostate Paternal Grandfather   . Cancer - Prostate Maternal Grandfather   . Heart attack Father 4  . Stroke Neg Hx   . Heart disease Mother    Social History  Substance Use Topics  . Smoking status: Current Every Day Smoker -- 0.00 packs/day for 30 years    Types: Cigarettes  . Smokeless tobacco: Never Used  . Alcohol Use: Yes    Review of Systems  Constitutional: Positive for diaphoresis. Negative for  fever and chills.  Respiratory: Positive for shortness of breath. Negative for cough.   Cardiovascular: Positive for chest pain and palpitations. Negative for leg swelling.  Gastrointestinal: Negative for nausea, vomiting and abdominal pain.  Musculoskeletal: Negative for back pain.  Skin: Negative for rash and wound.  Neurological: Negative for dizziness, weakness, light-headedness, numbness and headaches.  All other systems reviewed and are negative.  Allergies  Codeine and Morphine and related  Home Medications   Prior to Admission medications   Medication Sig Start Date End Date Taking? Authorizing Provider  aspirin EC 81 MG tablet Take 81 mg by mouth daily.    Yes Historical Provider, MD  diltiazem (CARDIZEM CD) 120 MG 24 hr capsule Take 1 capsule (120 mg  total) by mouth daily. 05/09/15  Yes Orson Eva, MD  metoprolol succinate (TOPROL-XL) 50 MG 24 hr tablet Take 1 tablet (50 mg total) by mouth daily. May take an extra 25mg  daily as needed. 07/06/14  Yes [name], MD  valsartan-hydrochlorothiazide (DIOVAN-HCT) 160-25 MG tablet Take 1 tablet by mouth daily.   Yes Historical Provider, MD  ALPRAZolam Duanne Moron) 1 MG tablet Take 1 tablet (1 mg total) by mouth at bedtime as needed. sleep Patient not taking: Reported on 06/16/2015 07/03/13   Camelia Eng Tysinger, PA-C   Triage Vitals: BP 119/70 mmHg  Pulse 95  Temp(Src) 98 F (36.7 C) (Oral)  Resp 16  SpO2 95%  Physical Exam  Constitutional: He is oriented to person, place, and time. He appears well-developed and well-nourished. No distress.  HENT:  Head: Normocephalic and atraumatic.  Mouth/Throat: Oropharynx is clear and moist. No oropharyngeal exudate.  Eyes: EOM are normal. Pupils are equal, round, and reactive to light.  Neck: Normal range of motion. Neck supple.  Cardiovascular: Normal rate and regular rhythm.  Exam reveals no gallop and no friction rub.   No murmur heard. Pulmonary/Chest: Effort normal and breath sounds normal. No respiratory distress. He has no wheezes. He has no rales.  Diminished breath sounds throughout likely due to body habitus.  Abdominal: Soft. Bowel sounds are normal. He exhibits no distension and no mass. There is no tenderness. There is no rebound and no guarding.  Musculoskeletal: Normal range of motion. He exhibits edema. He exhibits no tenderness.  3+ left lower extremity pitting edema. 2+ right lower extremity pitting edema. No calf tenderness.  Neurological: He is alert and oriented to person, place, and time.  Moves all extremities without deficit. Sensation is fully intact.  Skin: Skin is warm and dry. No rash noted. No erythema.  Psychiatric: He has a normal mood and affect. His behavior is normal.  Nursing note and vitals reviewed.   ED Course   Procedures (including critical care time)  DIAGNOSTIC STUDIES: Oxygen Saturation is 95% on RA, adequate by my interpretation.    COORDINATION OF CARE: 2:06 AM-Discussed treatment plan which includes BNP, Troponin, BMP, and CBC with pt at bedside and pt agreed to plan.   Labs Review Labs Reviewed  BASIC METABOLIC PANEL - Abnormal; Notable for the following:    Sodium 134 (*)    Chloride 100 (*)    All other components within normal limits  BRAIN NATRIURETIC PEPTIDE - Abnormal; Notable for the following:    B Natriuretic Peptide 126.3 (*)    All other components within normal limits  CBC  I-STAT TROPOININ, ED    Imaging Review Dg Chest 2 View  06/16/2015  CLINICAL DATA:  Acute onset of shortness of breath, generalized  chest pain and palpitations. Initial encounter. EXAM: CHEST  2 VIEW COMPARISON:  Chest radiograph performed 05/07/2015 FINDINGS: The lungs are well-aerated. Vascular congestion is noted, with mildly increased interstitial markings, raising question for minimal interstitial edema. There is no evidence of pleural effusion or pneumothorax, though the costophrenic angles are incompletely imaged on this study. The heart is borderline normal in size. No acute osseous abnormalities are seen. IMPRESSION: Vascular congestion, with mildly increased interstitial markings, raising question for minimal interstitial edema. Electronically Signed   By: Garald Balding M.D.   On: 06/16/2015 01:59   I have personally reviewed and evaluated these images and lab results as part of my medical decision-making.   EKG Interpretation   Date/Time:  Thursday June 16 2015 01:16:38 EDT Ventricular Rate:  110 PR Interval:    QRS Duration: 140 QT Interval:  360 QTC Calculation: 487 R Axis:   -53 Text Interpretation:  Atrial fibrillation with rapid ventricular response  Left axis deviation Right bundle branch block Abnormal ECG Confirmed by  Kianna Billet  MD, [place] ([id number]) on 06/16/2015 1:42:56  AM      MDM   Final diagnoses:  Atrial fibrillation with RVR ([place])  Acute pulmonary edema ([place])    I personally performed the services described in this documentation, which was scribed in my presence. The recorded information has been reviewed and is accurate.    Patient diuresed roughly 2 L. Continues to be chest pain-free. Discussed with cardiology who will admit patient.    David Rice, MD 06/16/15 [phone]

## 2015-06-16 NOTE — Progress Notes (Signed)
Patient Name: David [name] Date of Encounter: 06/16/2015   SUBJECTIVE  Converted to sinus rhythm @10 :56am. Feeling ok.   CURRENT MEDS . aspirin EC  81 mg Oral Daily  . diltiazem  120 mg Oral Daily  . hydrochlorothiazide  25 mg Oral Daily  . [START ON 06/17/2015] Influenza vac split quadrivalent PF  0.5 mL Intramuscular Tomorrow-1000  . irbesartan  150 mg Oral Daily  . metoprolol succinate  50 mg Oral Daily    OBJECTIVE  Filed Vitals:   06/16/15 0430 06/16/15 0530 06/16/15 0611 06/16/15 1029  BP: 101/61 105/53 115/63 114/43  Pulse: 89 91 93 89  Temp:   98.2 F (36.8 C) 97.6 F (36.4 C)  TempSrc:   Oral Oral  Resp: 15 15 16 18   Height:  6\' 4"  (1.93 m) 6\' 4"  (1.93 m)   Weight:  410 lb (185.975 kg) 409 lb 9.6 oz (185.793 kg)   SpO2: 93% 93% 96% 96%    Intake/Output Summary (Last 24 hours) at 06/16/15 1152 Last data filed at 06/16/15 0800  Gross per 24 hour  Intake     26 ml  Output    200 ml  Net   -174 ml   Filed Weights   06/16/15 0530 06/16/15 0611  Weight: 410 lb (185.975 kg) 409 lb 9.6 oz (185.793 kg)    PHYSICAL EXAM  General: Pleasant, NAD. Neuro: Alert and oriented X 3. Moves all extremities spontaneously. Psych: Normal affect. HEENT:  Normal  Neck: Supple without bruits. JVD difficult to assess due to grith.  Lungs:  Resp regular and unlabored. Diminished breath sound bibasilar with faint rales.  Heart: RRR no s3, s4, or murmurs. Abdomen: Soft, non-tender, non-distended, BS + x 4.  Extremities: No clubbing, cyanosis. Trace to 1+ BL LE edema. DP/PT/Radials 2+ and equal bilaterally.  Accessory Clinical Findings  CBC  Recent Labs  06/16/15 0129  WBC 9.7  HGB 16.5  HCT 47.0  MCV 92.7  PLT 448   Basic Metabolic Panel  Recent Labs  06/16/15 0129 06/16/15 0640  NA 134*  --   K 3.6  --   CL 100*  --   CO2 24  --   GLUCOSE 94  --   BUN 14  --   CREATININE 1.11  --   CALCIUM 9.3  --   MG  --  1.9  Thyroid Function Tests  Recent  Labs  06/16/15 0640  TSH 4.387    TELE  Sinus rhythm   Radiology/Studies  Dg Chest 2 View  06/16/2015  CLINICAL DATA:  Acute onset of shortness of breath, generalized chest pain and palpitations. Initial encounter. EXAM: CHEST  2 VIEW COMPARISON:  Chest radiograph performed 05/07/2015 FINDINGS: The lungs are well-aerated. Vascular congestion is noted, with mildly increased interstitial markings, raising question for minimal interstitial edema. There is no evidence of pleural effusion or pneumothorax, though the costophrenic angles are incompletely imaged on this study. The heart is borderline normal in size. No acute osseous abnormalities are seen. IMPRESSION: Vascular congestion, with mildly increased interstitial markings, raising question for minimal interstitial edema. Electronically Signed   By: Garald Balding M.D.   On: 06/16/2015 01:59  Echo 05/08/15 LV EF: 60% -  65%  ------------------------------------------------------------------- Indications:   Chest pain 786.51.  ------------------------------------------------------------------- History:  PMH: RBBB, Aortic Aneurysm. Risk factors: Current tobacco use. Hypertension.  ------------------------------------------------------------------- Study Conclusions  - Left ventricle: The cavity size was normal. Systolic function was normal. The estimated ejection fraction was  in the range of 60% to 65%. Images were inadequate for LV wall motion assessment. - Aortic valve: Poorly visualized. Moderate thickening and calcification. There was moderate stenosis. There was trivial regurgitation. Valve area (VTI): 1.15 cm^2. Valve area (Vmax): 1.07 cm^2. Valve area (Vmean): 1.13 cm^2. - Right ventricle: Poorly visualized. - Pulmonic valve: Poorly visualized  ASSESSMENT AND PLAN  1. Paroxysmal atrial fibrillation 2. Obstructive sleep apnea on BIPAP 3. Hypertension 4. Mild aortic stenosis/regurgitation, EF  60-65% 5. Right bundle-branch block 6. Mild aortic root dilatation 7. Morbid obesity  Plan: Converted to sinus rhythm @10 :56 am today. On Cardizem CD and Troprol XL for rate control. His CHADSVACs score is 1 (HTN). Takes asa 81mg  daily at home. Iv heparin for anticoagulation now. Recent echo 05/08/15 with LV EF of 60-65%, poor study to for LV motion, moderate AS and mildly dilated aortic root 4.1 cm.  Further management per MD. This is his 3rd episode in last month. Diuresed 119ml on LV lasix 40mg  x 1. Trace to 1+ LE edema with faint bibasilar rales on exam.   Recent stress test was negative for ischemia. Seen recently by Dr. Debara Pickett and "discussed the possibility of vein ablation and he is willing to consider it. For now recommend fitting a compression stocking, 20-30 mmHg and wearing that regularly. If he should be interested in it further I would recommend a referral to Dr. Donnetta Hutching with [facility]".  Signed, Leanor Kail PA-C   Patient seen and examined and history reviewed. Agree with above findings and plan. Patient converted to NSR this am. Feels well. Has had a total of 3 episodes of AFib in the last month- self terminating. Rate controlled on metoprolol and cardizem. Aggravating factors include morbid obesity, OSA, tobacco abuse, caffeine, and decongestants (recently stopped taking Zytec D). Recent Echo shows moderate AS with normal LV function. Myoview was normal. We discussed the possibility of adding AAD therapy. Options are limited. Would avoid 1C drugs with AS. At his age would avoid amiodarone. Tikosyn or Sotalol may be options. At this point will continue rate control and try and eliminate stimulants. Will DC home today and arrange follow up with Dr. Debara Pickett. [place] vasc of 1 so will continue ASA.   Soo Steelman [place], [place] 06/16/2015 12:16 PM

## 2015-06-16 NOTE — ED Notes (Signed)
Patient transported to X-ray 

## 2015-06-16 NOTE — Discharge Instructions (Signed)
Atrial Fibrillation °Atrial fibrillation is a type of heartbeat that is irregular or fast (rapid). If you have this condition, your heart keeps quivering in a weird (chaotic) way. This condition can make it so your heart cannot pump blood normally. Having this condition gives a person more risk for stroke, heart failure, and other heart problems. There are different types of atrial fibrillation. Talk with your doctor to learn about the type that you have. °HOME CARE °· Take over-the-counter and prescription medicines only as told by your doctor. °· If your doctor prescribed a blood-thinning medicine, take it exactly as told. Taking too much of it can cause bleeding. If you do not take enough of it, you will not have the protection that you need against stroke and other problems. °· Do not use any tobacco products. These include cigarettes, chewing tobacco, and e-cigarettes. If you need help quitting, ask your doctor. °· If you have apnea (obstructive sleep apnea), manage it as told by your doctor. °· Do not drink alcohol. °· Do not drink beverages that have caffeine. These include coffee, soda, and tea. °· Maintain a healthy weight. Do not use diet pills unless your doctor says they are safe for you. Diet pills may make heart problems worse. °· Follow diet instructions as told by your doctor. °· Exercise regularly as told by your doctor. °· Keep all follow-up visits as told by your doctor. This is important. °GET HELP IF: °· You notice a change in the speed, rhythm, or strength of your heartbeat. °· You are taking a blood-thinning medicine and you notice more bruising. °· You get tired more easily when you move or exercise. °GET HELP RIGHT AWAY IF: °· You have pain in your chest or your belly (abdomen). °· You have sweating or weakness. °· You feel sick to your stomach (nauseous). °· You notice blood in your throw up (vomit), poop (stool), or pee (urine). °· You are short of breath. °· You suddenly have swollen feet  and ankles. °· You feel dizzy. °· Your suddenly get weak or numb in your face, arms, or legs, especially if it happens on one side of your body. °· You have trouble talking, trouble understanding, or both. °· Your face or your eyelid droops on one side. °These symptoms may be an emergency. Do not wait to see if the symptoms will go away. Get medical help right away. Call your local emergency services (911 in the [place] Do not drive yourself to the hospital. °  °This information is not intended to replace advice given to you by your health care provider. Make sure you discuss any questions you have with your health care provider. °  °Document Released: 05/15/2008 Document Revised: 04/27/2015 Document Reviewed: 12/01/2014 °Elsevier Interactive Patient Education ©2016 [employer] ° °

## 2015-06-16 NOTE — H&P (Signed)
David [name] is an 59 y.o. male.   Chief Complaint:  Primary Cardiologist: Dr. Debara Pickett HPI: David [name] is a 59 yo man with PMH of paroxysmal atrial fibrillation, OSA on CPAP, dyslipidemia, hypertension who presented t the ER with sudden onset palpitations with associated shortness of breath and dyspnea on exertion that began Wednesday AM. He endorses some associated chest pain with the faster heart-rate. He took 324 mg asa earlier today. There was also some associated diaphoresis. He also endorses similar symptoms 3-4 days ago that lasted 1-2 hours and resolved spontaneously. He is frustrated with the atrial fibrillation and he feels terrible walking around in atrial fibrillation. He has been compliant with his medications and CPAP. He has a stressful job as a Educational psychologist but no change in the stress and he enjoys his job.   He was seen in consultation at recent hospitalization in September by Dr. Debara Pickett and in clinic follow-up 10/6. He has continued to wear his CPAP at home, he endorses 20 lbs of weight gain. He had a negative/low risk nuclear stress test and Echo with EF of 60%, moderate AS and mildly dilated aortic root 4.1 cm. He endorses improved blood pressure at home.   His HR improved in the ER and he remained in atrial fibrillation with HR 80-90s at rest. However, he continued to feel terrible.   Past Medical History  Diagnosis Date  . Allergy     RHINITIS  . Hyperlipidemia   . Rosacea   . Smoker   . GERD (gastroesophageal reflux disease)     "not really"  . PAF (paroxysmal atrial fibrillation) (HCC)     associated with OSA  . Sleep apnea     CPAP  . Shortness of breath     With activity  . Hypertension     takes Diovan and Toprol  . Arthritis     thumb  . History of echocardiogram 03/29/2009    EF 50-55%; mild concentric LVH;   . History of nuclear stress test 08/28/2007    moderate diaphragmatic attenuation; EKG neg for ischemia   . Systolic murmur   . Right bundle branch  block   . Obesity     Past Surgical History  Procedure Laterality Date  . Shoulder closed reduction      Procedure: CLOSED REDUCTION SHOULDER;  Surgeon: [facility];  Location: [place];  Service: Orthopedics;  Laterality: Right;  . Finger surgery      Reconstruction of Left thumb and L index finger.  Reconstruction   . Tonsillectomy      as a child  . Knee arthroscopy    . Shoulder open rotator cuff repair  08/11/2011    Procedure: ROTATOR CUFF REPAIR SHOULDER OPEN;  Surgeon: Willa Frater III;  Location: [place];  Service: Orthopedics;  Laterality: Right;    Family History  Problem Relation Age of Onset  . Cancer - Colon Father   . Cancer Paternal Uncle   . Anesthesia problems Neg Hx   . Cancer - Prostate Paternal Grandfather   . Cancer - Prostate Maternal Grandfather   . Heart attack Father 66  . Stroke Neg Hx   . Heart disease Mother    Social History:  reports that he has been smoking Cigarettes.  He has been smoking about 0.00 packs per day for the past 30 years. He has never used smokeless tobacco. He reports that he drinks alcohol. He reports that he does not use illicit drugs.  Allergies:  Allergies  Allergen Reactions  . Codeine Shortness Of Breath and Swelling  . Morphine And Related Shortness Of Breath and Swelling     (Not in a hospital admission)  Results for orders placed or performed during the hospital encounter of 06/16/15 (from the past 48 hour(s))  Basic metabolic panel     Status: Abnormal   Collection Time: 06/16/15  1:29 AM  Result Value Ref Range   Sodium 134 (L) 135 - 145 mmol/L   Potassium 3.6 3.5 - 5.1 mmol/L   Chloride 100 (L) 101 - 111 mmol/L   CO2 24 22 - 32 mmol/L   Glucose, Bld 94 65 - 99 mg/dL   BUN 14 6 - 20 mg/dL   Creatinine, Ser 1.11 0.61 - 1.24 mg/dL   Calcium 9.3 8.9 - 10.3 mg/dL   GFR calc non Af Amer >60 >60 mL/min   GFR calc Af Amer >60 >60 mL/min    Comment: (NOTE) The eGFR has been calculated using the CKD EPI  equation. This calculation has not been validated in all clinical situations. eGFR's persistently <60 mL/min signify possible Chronic Kidney Disease.    Anion gap 10 5 - 15  CBC     Status: None   Collection Time: 06/16/15  1:29 AM  Result Value Ref Range   WBC 9.7 4.0 - 10.5 K/uL   RBC 5.07 4.22 - 5.81 MIL/uL   Hemoglobin 16.5 13.0 - 17.0 g/dL   HCT 47.0 39.0 - 52.0 %   MCV 92.7 78.0 - 100.0 fL   MCH 32.5 26.0 - 34.0 pg   MCHC 35.1 30.0 - 36.0 g/dL   RDW 13.5 11.5 - 15.5 %   Platelets 254 150 - 400 K/uL  I-stat troponin, ED     Status: None   Collection Time: 06/16/15  1:32 AM  Result Value Ref Range   Troponin i, poc 0.00 0.00 - 0.08 ng/mL   Comment 3            Comment: Due to the release kinetics of cTnI, a negative result within the first hours of the onset of symptoms does not rule out myocardial infarction with certainty. If myocardial infarction is still suspected, repeat the test at appropriate intervals.   Brain natriuretic peptide     Status: Abnormal   Collection Time: 06/16/15  2:25 AM  Result Value Ref Range   B Natriuretic Peptide 126.3 (H) 0.0 - 100.0 pg/mL   Dg Chest 2 View  06/16/2015  CLINICAL DATA:  Acute onset of shortness of breath, generalized chest pain and palpitations. Initial encounter. EXAM: CHEST  2 VIEW COMPARISON:  Chest radiograph performed 05/07/2015 FINDINGS: The lungs are well-aerated. Vascular congestion is noted, with mildly increased interstitial markings, raising question for minimal interstitial edema. There is no evidence of pleural effusion or pneumothorax, though the costophrenic angles are incompletely imaged on this study. The heart is borderline normal in size. No acute osseous abnormalities are seen. IMPRESSION: Vascular congestion, with mildly increased interstitial markings, raising question for minimal interstitial edema. Electronically Signed   By: Garald Balding M.D.   On: 06/16/2015 01:59    Review of Systems   Constitutional: Positive for malaise/fatigue. Negative for fever, chills and weight loss.  HENT: Negative for ear discharge and ear pain.   Eyes: Negative for double vision and photophobia.  Respiratory: Negative for cough, hemoptysis and sputum production.   Cardiovascular: Positive for palpitations and leg swelling. Negative for chest pain and claudication.  Gastrointestinal:  Negative for heartburn, nausea, vomiting and abdominal pain.  Musculoskeletal: Negative for myalgias, back pain and neck pain.  Skin: Negative for rash.  Neurological: Negative for tingling, tremors, sensory change, speech change and headaches.  Endo/Heme/Allergies: Negative for polydipsia. Does not bruise/bleed easily.  Psychiatric/Behavioral: Negative for depression, suicidal ideas and substance abuse. The patient is nervous/anxious.     Blood pressure 117/68, pulse 86, temperature 98 F (36.7 C), temperature source Oral, resp. rate 18, SpO2 93 %. Physical Exam  Nursing note and vitals reviewed. Constitutional: He is oriented to person, place, and time. He appears well-developed and well-nourished. No distress.  HENT:  Head: Normocephalic and atraumatic.  Nose: Nose normal.  Mouth/Throat: Oropharynx is clear and moist. No oropharyngeal exudate.  Eyes: Conjunctivae and EOM are normal. Pupils are equal, round, and reactive to light. No scleral icterus.  Neck: Normal range of motion. Neck supple. JVD present. No tracheal deviation present.  JVP to just under mandible   Cardiovascular: Normal heart sounds and intact distal pulses.  Exam reveals no gallop.   No murmur heard. Irregularly irregular  Respiratory: Effort normal and breath sounds normal. No respiratory distress. He has no rales.  Bibasilar rales  GI: Soft. Bowel sounds are normal. He exhibits no distension. There is no tenderness. There is no rebound.  Musculoskeletal: Normal range of motion. He exhibits edema. He exhibits no tenderness.   Neurological: He is alert and oriented to person, place, and time. No cranial nerve deficit. Coordination normal.  Skin: Skin is warm and dry. No rash noted. He is not diaphoretic. No erythema.  Psychiatric: He has a normal mood and affect. His behavior is normal. Thought content normal.    Labs reviewed, wbc 9.7, h/h 16/47, plt 254, bun/cr 14/1.11, na 134, K 3.6, co2 24, bnp 126, Trop 0.00 Chest x-ray: vascular congestion EKG: atrial fibrillation 110 #1, #2 Atrial flutter, RBBB HR 80s Echo: 9/16: EF 60-65%, moderate AS, mean gradient 22 mmHg Assessment/Plan Mr. Bard is a 59 yo man with PMH of paroxysmal atrial fibrillation, OSA on CPAP, dyslipidemia, hypertension who presented to the ER with sudden onset palpitations with associated shortness of breath and dyspnea on exertion. He is in atrial fibrillation, one ecg with atrial flutter, moderate volume overload with elevated jvp and vascular congestion on chest x-ray. I favor gentle diuresis and likely TEE-DCCV later today. Keep NPO. We discussed risk factor modification including controlling hypertension and OSA with CPAP as well as weight loss and physical activity. He has structurally abnormal heart so flecainide/propafenone are not good rhythm control agents for him. I favor TEE-DCCV. He may need tikosyn at some point but he does not want to stay in the hospital for 3 day load currently.  Problem List Atrial fibrillation with RVR  OSA on CPAP Hypertension Vascular Congestion/Volume Overload/Acute diastolic heart failure Moderate Aortic Stenosis  Tobacco abuse  Plan:  1. Admit to telemetry, observation, start heparin gtt given weight > 150 kg (no lovenox) 2. NPO, plan for likely TEE-DCCV later today 3. 40 mg IV lasix x1 4. Continue hypertension home medications 5. Tobacco cessation counseling 6. Weight loss planning and physical activity education/discussion  Elveria Lauderbaugh 06/16/2015, 4:34 AM

## 2015-06-16 NOTE — Progress Notes (Signed)
ANTICOAGULATION CONSULT NOTE - Initial Consult  Pharmacy Consult for heparin Indication: atrial fibrillation  Allergies  Allergen Reactions  . Codeine Shortness Of Breath and Swelling  . Morphine And Related Shortness Of Breath and Swelling    Patient Measurements: Height: 6\' 4"  (193 cm) Weight: (!) 409 lb 9.6 oz (185.793 kg) IBW/kg (Calculated) : 86.8 Heparin Dosing Weight: 135kg  Vital Signs: Temp: 97.6 F (36.4 C) (10/27 1029) Temp Source: Oral (10/27 1029) BP: 114/43 mmHg (10/27 1029) Pulse Rate: 89 (10/27 1029)  Labs:  Recent Labs  06/16/15 0129 06/16/15 0640 06/16/15 1215  HGB 16.5  --   --   HCT 47.0  --   --   PLT 254  --   --   APTT  --  32  --   LABPROT  --  14.3  --   INR  --  1.09  --   HEPARINUNFRC  --   --  0.13*  CREATININE 1.11  --   --     Estimated Creatinine Clearance: 129.7 mL/min (by C-G formula based on Cr of 1.11).   Medical History: Past Medical History  Diagnosis Date  . Allergy     RHINITIS  . Hyperlipidemia   . Rosacea   . Smoker   . GERD (gastroesophageal reflux disease)     "not really"  . PAF (paroxysmal atrial fibrillation) (HCC)     associated with OSA  . Sleep apnea     CPAP  . Shortness of breath     With activity  . Hypertension     takes Diovan and Toprol  . Arthritis     thumb  . History of echocardiogram 03/29/2009    EF 50-55%; mild concentric LVH;   . History of nuclear stress test 08/28/2007    moderate diaphragmatic attenuation; EKG neg for ischemia   . Systolic murmur   . Right bundle branch block   . Obesity     Medications:  Prescriptions prior to admission  Medication Sig Dispense Refill Last Dose  . aspirin EC 81 MG tablet Take 81 mg by mouth daily.    06/15/2015 at Unknown time  . diltiazem (CARDIZEM CD) 120 MG 24 hr capsule Take 1 capsule (120 mg total) by mouth daily. 30 capsule 1 06/15/2015 at Unknown time  . metoprolol succinate (TOPROL-XL) 50 MG 24 hr tablet Take 1 tablet (50 mg total) by  mouth daily. May take an extra 25mg  daily as needed. 60 tablet 11 06/15/2015 at 0930  . valsartan-hydrochlorothiazide (DIOVAN-HCT) 160-25 MG tablet Take 1 tablet by mouth daily.   06/15/2015 at Unknown time  . ALPRAZolam (XANAX) 1 MG tablet Take 1 tablet (1 mg total) by mouth at bedtime as needed. sleep (Patient not taking: Reported on 06/16/2015) 30 tablet 1 Not Taking at Unknown time   Scheduled:  . aspirin EC  81 mg Oral Daily  . diltiazem  120 mg Oral Daily  . hydrochlorothiazide  25 mg Oral Daily  . [START ON 06/17/2015] Influenza vac split quadrivalent PF  0.5 mL Intramuscular Tomorrow-1000  . irbesartan  150 mg Oral Daily  . metoprolol succinate  50 mg Oral Daily    Assessment: 59yo male w/ h/o PAF (not anticoagulated PTA) c/o palpitations onset yesterday am associated w/ SOB and dyspnea as well as some diaphoresis, denied CP except for briefly in the afternoon on admit. He was found to be in Afib w/ RVR. Plan for DCCV today. Began IV heparin Infusion this AM.  The initial 6 hour heparin level is 0.13, SUBtherapeutic on 2000 units/hr IV heparin drip.  No bleeding noted.  Obese male. TBW 185.8 kg, IBW 86.8 kg, dosing weight for heparin = 135 kg  Goal of Therapy:  Heparin level 0.3-0.7 units/ml Monitor platelets by anticoagulation protocol: Yes   Plan:  Heparin bolus 4000 units IV x1 Increase heparin drip to 2300 units/hr Check heparin level in 6 hr Monitor heparin levels and CBC.    Nicole Cella, RPh Clinical Pharmacist Pager: [phone] 06/16/2015,12:58 PM

## 2015-06-16 NOTE — Progress Notes (Signed)
ANTICOAGULATION CONSULT NOTE - Initial Consult  Pharmacy Consult for heparin Indication: atrial fibrillation  Allergies  Allergen Reactions  . Codeine Shortness Of Breath and Swelling  . Morphine And Related Shortness Of Breath and Swelling    Patient Measurements: Height: 6\' 4"  (193 cm) Weight: (!) 410 lb (185.975 kg) IBW/kg (Calculated) : 86.8 Heparin Dosing Weight: 135kg  Vital Signs: Temp: 98 F (36.7 C) (10/27 0122) Temp Source: Oral (10/27 0122) BP: 105/53 mmHg (10/27 0530) Pulse Rate: 91 (10/27 0530)  Labs:  Recent Labs  06/16/15 0129  HGB 16.5  HCT 47.0  PLT 254  CREATININE 1.11    Estimated Creatinine Clearance: 129.8 mL/min (by C-G formula based on Cr of 1.11).   Medical History: Past Medical History  Diagnosis Date  . Allergy     RHINITIS  . Hyperlipidemia   . Rosacea   . Smoker   . GERD (gastroesophageal reflux disease)     "not really"  . PAF (paroxysmal atrial fibrillation) (HCC)     associated with OSA  . Sleep apnea     CPAP  . Shortness of breath     With activity  . Hypertension     takes Diovan and Toprol  . Arthritis     thumb  . History of echocardiogram 03/29/2009    EF 50-55%; mild concentric LVH;   . History of nuclear stress test 08/28/2007    moderate diaphragmatic attenuation; EKG neg for ischemia   . Systolic murmur   . Right bundle branch block   . Obesity     Medications:  Prescriptions prior to admission  Medication Sig Dispense Refill Last Dose  . aspirin EC 81 MG tablet Take 81 mg by mouth daily.    06/15/2015 at Unknown time  . diltiazem (CARDIZEM CD) 120 MG 24 hr capsule Take 1 capsule (120 mg total) by mouth daily. 30 capsule 1 06/15/2015 at Unknown time  . metoprolol succinate (TOPROL-XL) 50 MG 24 hr tablet Take 1 tablet (50 mg total) by mouth daily. May take an extra 25mg  daily as needed. 60 tablet 11 06/15/2015 at 0930  . valsartan-hydrochlorothiazide (DIOVAN-HCT) 160-25 MG tablet Take 1 tablet by mouth  daily.   06/15/2015 at Unknown time  . ALPRAZolam (XANAX) 1 MG tablet Take 1 tablet (1 mg total) by mouth at bedtime as needed. sleep (Patient not taking: Reported on 06/16/2015) 30 tablet 1 Not Taking at Unknown time   Scheduled:  . aspirin EC  81 mg Oral Daily  . diltiazem  120 mg Oral Daily  . metoprolol succinate  50 mg Oral Daily    Assessment: 59yo male w/ h/o PAF (not currently anticoagulated) c/o palpitations onset yesterday am associated w/ SOB and dyspnea as well as some diaphoresis, denies CP except for briefly in the afternoon, found to be in Afib w/ RVR, plan for DCCV today, to begin heparin.  Goal of Therapy:  Heparin level 0.3-0.7 units/ml Monitor platelets by anticoagulation protocol: Yes   Plan:  Will give heparin 5000 units IV bolus x1 followed by gtt at 2000 units/hr and monitor heparin levels and CBC.  Wynona Neat, PharmD, BCPS  06/16/2015,6:01 AM

## 2015-06-16 NOTE — Plan of Care (Signed)
Problem: Phase III Progression Outcomes Goal: No anginal pain Outcome: Progressing Denies pain

## 2015-06-16 NOTE — Progress Notes (Signed)
Pt discharge education and instructions completed with pt; pt voices understanding and denies any questions. Pt IV and telemetry removed; pt discharge home and pt to drive himself back home. Pt transported off unit via wheelchair with belongings to the side. Francis Gaines Bodin Gorka RN.

## 2015-06-17 ENCOUNTER — Telehealth: Payer: Self-pay | Admitting: Internal Medicine

## 2015-06-17 LAB — HEMOGLOBIN A1C
Hgb A1c MFr Bld: 5.8 % — ABNORMAL HIGH (ref 4.8–5.6)
MEAN PLASMA GLUCOSE: 120 mg/dL

## 2015-06-17 NOTE — Telephone Encounter (Signed)
TOC call to patient no answer.LMTC. °

## 2015-06-17 NOTE — Telephone Encounter (Signed)
D/C phone call .Marland Kitchen Appt is on 07/05/15 at 10am w/ Dr. Debara Pickett at [facility]

## 2015-06-17 NOTE — Telephone Encounter (Signed)
Received TOC call from patient.He stated he feels good.Heart in rhythm.He understands discharge instructions and is taking medications as prescribed.Advised to keep post hospital appointment with Dr.Hilty 07/05/15 at 10:00 am.

## 2015-07-05 ENCOUNTER — Ambulatory Visit (INDEPENDENT_AMBULATORY_CARE_PROVIDER_SITE_OTHER): Payer: 59 | Admitting: Internal Medicine

## 2015-07-05 ENCOUNTER — Encounter: Payer: Self-pay | Admitting: Internal Medicine

## 2015-07-05 VITALS — BP 142/78 | HR 72 | Ht 75.0 in | Wt 399.1 lb

## 2015-07-05 DIAGNOSIS — I4510 Unspecified right bundle-branch block: Secondary | ICD-10-CM

## 2015-07-05 DIAGNOSIS — I480 Paroxysmal atrial fibrillation: Secondary | ICD-10-CM

## 2015-07-05 DIAGNOSIS — G4733 Obstructive sleep apnea (adult) (pediatric): Secondary | ICD-10-CM | POA: Diagnosis not present

## 2015-07-05 DIAGNOSIS — I10 Essential (primary) hypertension: Secondary | ICD-10-CM | POA: Diagnosis not present

## 2015-07-05 NOTE — Patient Instructions (Signed)
Call when ready to do an admission for [place] physician recommends that you schedule a follow-up appointment in: 3 months with Dr. Debara Pickett.

## 2015-07-06 ENCOUNTER — Other Ambulatory Visit: Payer: Self-pay | Admitting: Internal Medicine

## 2015-07-06 NOTE — Progress Notes (Signed)
OFFICE NOTE  Chief Complaint:  Follow-up hospitalization  Primary Care Physician: Wyatt Haste, MD  HPI:  David [name] is a 59 year old gentleman previously followed by Dr. Rex Kras with a history of AFib remotely in the past; was associated with sleep apnea. Once he went on treatment with CPAP his AFib has since disappeared. He has done fairly well, although continues to smoke. He injured his shoulder recently and had shoulder surgery. He follows up in the office today and reports that his blood pressure is been spiking recently. He's had a take an extra half of Toprol to help with his blood pressure in the afternoon. He has also gained about 20 pounds of weight back. He says that his sleep apnea mask is malfunctioning and that he feels he needs new equipment. His sleep study was in 2010 therefore he is well overdue for a repeat sleep apneas assessment. He has a stable right bundle branch block. There is no evidence for recurrent atrial fibrillation. He occasionally gets palpitations however they're short-lived and he says that they're nothing like the A. Fib he had before. When I previously saw him we had ordered a venous insufficiency study of the left leg due to swelling and venous stasis changes. This demonstrated reflux in the left greater saphenous vein which is significant and could be contributing to his symptoms.  David [name] returns today for follow-up of his stress test. As demonstrated no ischemia with an EF of 60%. We did make a small change in increasing his Toprol-XL for an additional 25 mg daily at bedtime. He says he's taking that may be 3 or 4 days a week. Generally his blood pressure control is been better. He says that work is died down somewhat and that his stress levels improved. He now attributes that to his symptoms. Having problems with left lower extremity swelling secondary to venous insufficiency. He wants to address this but does not feel that he has the  money now to undergo a venous ablation procedure.   I just saw David [name] in the office in October for follow-up of a stress test which was negative. He was doing well and had no recurrent atrial fibrillation. Then a week or 2 ago he was admitted for atrial fibrillation with rapid ventricular response. He was placed on a Cardizem drip and admitted. He converted back to sinus rhythm overnight and was discharged. Given his young age and low CHADSVASC or 01, he remains on aspirin 81 mg daily.  PMHx:  Past Medical History  Diagnosis Date  . Allergy     RHINITIS  . Hyperlipidemia   . Rosacea   . Smoker   . GERD (gastroesophageal reflux disease)     "not really"  . PAF (paroxysmal atrial fibrillation) (HCC)     associated with OSA  . Sleep apnea     CPAP  . Shortness of breath     With activity  . Hypertension     takes Diovan and Toprol  . Arthritis     thumb  . History of echocardiogram 03/29/2009    EF 50-55%; mild concentric LVH;   . History of nuclear stress test 08/28/2007    moderate diaphragmatic attenuation; EKG neg for ischemia   . Systolic murmur   . Right bundle branch block   . Obesity     Past Surgical History  Procedure Laterality Date  . Shoulder closed reduction      Procedure: CLOSED REDUCTION SHOULDER;  Surgeon: Lawrence Santiago [facility];  Location: [place];  Service: Orthopedics;  Laterality: Right;  . Finger surgery      Reconstruction of Left thumb and L index finger.  Reconstruction   . Tonsillectomy      as a child  . Knee arthroscopy    . Shoulder open rotator cuff repair  08/11/2011    Procedure: ROTATOR CUFF REPAIR SHOULDER OPEN;  Surgeon: Willa Frater III;  Location: [place];  Service: Orthopedics;  Laterality: Right;    FAMHx:  Family History  Problem Relation Age of Onset  . Cancer - Colon Father   . Cancer Paternal Uncle   . Anesthesia problems Neg Hx   . Cancer - Prostate Paternal Grandfather   . Cancer - Prostate Maternal Grandfather   . Heart  attack Father 50  . Stroke Neg Hx   . Heart disease Mother     SOCHx:   reports that he has been smoking Cigarettes.  He has smoked for the past 30 years. He has never used smokeless tobacco. He reports that he drinks alcohol. He reports that he does not use illicit drugs.  ALLERGIES:  Allergies  Allergen Reactions  . Codeine Shortness Of Breath and Swelling  . Morphine And Related Shortness Of Breath and Swelling    ROS: A comprehensive review of systems was negative except for: Cardiovascular: positive for lower extremity edema and palpitations  HOME MEDS: Current Outpatient Prescriptions  Medication Sig Dispense Refill  . ALPRAZolam (XANAX) 1 MG tablet Take 1 tablet (1 mg total) by mouth at bedtime as needed. sleep 30 tablet 1  . aspirin EC 81 MG tablet Take 81 mg by mouth daily.     Marland Kitchen diltiazem (CARDIZEM CD) 120 MG 24 hr capsule Take 1 capsule (120 mg total) by mouth daily. 30 capsule 1  . metoprolol succinate (TOPROL-XL) 50 MG 24 hr tablet Take 1 tablet (50 mg total) by mouth daily. May take an extra 25mg  daily as needed. 60 tablet 11  . valsartan-hydrochlorothiazide (DIOVAN-HCT) 160-25 MG tablet Take 1 tablet by mouth daily.     No current facility-administered medications for this visit.    LABS/IMAGING: No results found for this or any previous visit (from the past 48 hour(s)). No results found.  VITALS: BP 142/78 mmHg  Pulse 72  Ht 6\' 3"  (1.905 m)  Wt 399 lb 1.6 oz (181.031 kg)  BMI 49.88 kg/m2  EXAM: General appearance: alert, no distress and morbidly obese Lungs: clear to auscultation bilaterally Heart: regular rate and rhythm, S1, S2 normal, no murmur, click, rub or gallop Extremities: edema Trace bilateral Psych: Pleasant  EKG: Normal sinus rhythm at 72, RBBB  ASSESSMENT: 1. Paroxysmal atrial fibrillation - recurrent (CHADSVASC score of 1) 2. Obstructive sleep apnea on BIPAP 3. Hypertension 4. RBBB 5. Mild aortic stenosis/regurgitation, EF  60-65% 6. Right bundle-branch block 7. Mild aortic root dilatation 8. Morbid obesity 9. Symptomatic reflux of the left greater saphenous vein  PLAN: 1.   David [name] had recurrent PAF which spontaneously converted back to sinus rhythm. He remains on low-dose aspirin for CHADSVASC score of 1. At this point, since he is having recurrent atrial fibrillation, we discussed options including possible antiarrhythmic therapy. As he does have structural heart disease, his options are limited. The best option for him may be Tikosyn. He understands that he would need to be admitted for initiation of this medication. At this time, he is very involved in the [place] and reports that he does not have time to  be admitted to the hospital. He says that if it reoccurs and he has to go to the hospital for rapid A. fib, he would be willing to stay and be loaded on Tikosyn. For now, we'll continue his current medications and I'll plan to see him back in about 3 months or sooner should he have recurrent atrial fibrillation.  [name], MD, [facility] Attending Cardiologist [place] 07/06/2015, 7:00 PM

## 2015-07-07 NOTE — Telephone Encounter (Signed)
REFILL 

## 2015-07-09 ENCOUNTER — Other Ambulatory Visit: Payer: Self-pay | Admitting: Internal Medicine

## 2015-08-05 ENCOUNTER — Other Ambulatory Visit: Payer: Self-pay | Admitting: Internal Medicine

## 2015-10-04 ENCOUNTER — Ambulatory Visit (INDEPENDENT_AMBULATORY_CARE_PROVIDER_SITE_OTHER): Payer: 59 | Admitting: Internal Medicine

## 2015-10-04 ENCOUNTER — Encounter: Payer: Self-pay | Admitting: Internal Medicine

## 2015-10-04 VITALS — BP 118/78 | HR 74 | Ht 76.0 in | Wt 394.0 lb

## 2015-10-04 DIAGNOSIS — I350 Nonrheumatic aortic (valve) stenosis: Secondary | ICD-10-CM | POA: Diagnosis not present

## 2015-10-04 DIAGNOSIS — I4510 Unspecified right bundle-branch block: Secondary | ICD-10-CM

## 2015-10-04 DIAGNOSIS — G4733 Obstructive sleep apnea (adult) (pediatric): Secondary | ICD-10-CM

## 2015-10-04 DIAGNOSIS — I480 Paroxysmal atrial fibrillation: Secondary | ICD-10-CM | POA: Diagnosis not present

## 2015-10-04 DIAGNOSIS — I719 Aortic aneurysm of unspecified site, without rupture: Secondary | ICD-10-CM

## 2015-10-04 MED ORDER — METOPROLOL SUCCINATE ER 50 MG PO TB24: 50.0000 mg | ORAL_TABLET | Freq: Every day | ORAL | Status: DC

## 2015-10-04 MED ORDER — VALSARTAN-HYDROCHLOROTHIAZIDE 160-25 MG PO TABS: 1.0000 | ORAL_TABLET | Freq: Every day | ORAL | Status: DC

## 2015-10-04 MED ORDER — DILTIAZEM HCL ER COATED BEADS 120 MG PO CP24: 120.0000 mg | ORAL_CAPSULE | Freq: Every day | ORAL | Status: DC

## 2015-10-04 NOTE — Patient Instructions (Addendum)
Elberta Leatherwood (pharmacist) will contact you regarding a Tikosyn admission at [facility] wants you to follow-up in: 3 months with Dr. Debara Pickett. You will receive a reminder letter in the mail two months in advance. If you don't receive a letter, please call our office to schedule the follow-up appointment.      Staff message sent to Elberta Leatherwood on 10/04/15 @ 5:13pm regarding [place] admission

## 2015-10-05 DIAGNOSIS — I350 Nonrheumatic aortic (valve) stenosis: Secondary | ICD-10-CM | POA: Insufficient documentation

## 2015-10-05 NOTE — Progress Notes (Signed)
OFFICE NOTE  Chief Complaint:  Follow-up atrial fibrillation  Primary Care Physician: Wyatt Haste, MD  HPI:  David [name] is a 60 year old gentleman previously followed by Dr. Rex Kras with a history of AFib remotely in the past; was associated with sleep apnea. Once he went on treatment with CPAP his AFib has since disappeared. He has done fairly well, although continues to smoke. He injured his shoulder recently and had shoulder surgery. He follows up in the office today and reports that his blood pressure is been spiking recently. He's had a take an extra half of Toprol to help with his blood pressure in the afternoon. He has also gained about 20 pounds of weight back. He says that his sleep apnea mask is malfunctioning and that he feels he needs new equipment. His sleep study was in 2010 therefore he is well overdue for a repeat sleep apneas assessment. He has a stable right bundle branch block. There is no evidence for recurrent atrial fibrillation. He occasionally gets palpitations however they're short-lived and he says that they're nothing like the A. Fib he had before. When I previously saw him we had ordered a venous insufficiency study of the left leg due to swelling and venous stasis changes. This demonstrated reflux in the left greater saphenous vein which is significant and could be contributing to his symptoms.  [name] returns today for follow-up of his stress test. As demonstrated no ischemia with an EF of 60%. We did make a small change in increasing his Toprol-XL for an additional 25 mg daily at bedtime. He says he's taking that may be 3 or 4 days a week. Generally his blood pressure control is been better. He says that work is died down somewhat and that his stress levels improved. He now attributes that to his symptoms. Having problems with left lower extremity swelling secondary to venous insufficiency. He wants to address this but does not feel that he has  the money now to undergo a venous ablation procedure.  I just saw [name] in the office in October for follow-up of a stress test which was negative. He was doing well and had no recurrent atrial fibrillation. Then a week or 2 ago he was admitted for atrial fibrillation with rapid ventricular response. He was placed on a Cardizem drip and admitted. He converted back to sinus rhythm overnight and was discharged. Given his young age and low CHADSVASC or 01, he remains on aspirin 81 mg daily.  [name] was seen today in follow-up. He reports that he had recent recurrent atrial fibrillation. He said lasted for several hours one morning when he was getting up to go to work and eventually subsided. He was highly symptomatic with it. He continues to have recurrent symptomatic atrial fibrillation. We talked about options including possible antiarrhythmic therapy. Given the fact that he has valvular heart disease and structural heart disease, his options are limited. I was favoring hospitalization in initiating possible Tikosyn therapy.  PMHx:  Past Medical History  Diagnosis Date  . Allergy     RHINITIS  . Hyperlipidemia   . Rosacea   . Smoker   . GERD (gastroesophageal reflux disease)     "not really"  . PAF (paroxysmal atrial fibrillation) (HCC)     associated with OSA  . Sleep apnea     CPAP  . Shortness of breath     With activity  . Hypertension     takes Diovan and Toprol  .  Arthritis     thumb  . History of echocardiogram 03/29/2009    EF 50-55%; mild concentric LVH;   . History of nuclear stress test 08/28/2007    moderate diaphragmatic attenuation; EKG neg for ischemia   . Systolic murmur   . Right bundle branch block   . Obesity     Past Surgical History  Procedure Laterality Date  . Shoulder closed reduction      Procedure: CLOSED REDUCTION SHOULDER;  Surgeon: Lawrence Santiago [facility];  Location: [place];  Service: Orthopedics;  Laterality: Right;  . Finger surgery       Reconstruction of Left thumb and L index finger.  Reconstruction   . Tonsillectomy      as a child  . Knee arthroscopy    . Shoulder open rotator cuff repair  08/11/2011    Procedure: ROTATOR CUFF REPAIR SHOULDER OPEN;  Surgeon: Willa Frater III;  Location: [place];  Service: Orthopedics;  Laterality: Right;    FAMHx:  Family History  Problem Relation Age of Onset  . Cancer - Colon Father   . Cancer Paternal Uncle   . Anesthesia problems Neg Hx   . Cancer - Prostate Paternal Grandfather   . Cancer - Prostate Maternal Grandfather   . Heart attack Father 43  . Stroke Neg Hx   . Heart disease Mother     SOCHx:   reports that he has been smoking Cigarettes.  He has smoked for the past 30 years. He has never used smokeless tobacco. He reports that he drinks alcohol. He reports that he does not use illicit drugs.  ALLERGIES:  Allergies  Allergen Reactions  . Codeine Shortness Of Breath and Swelling  . Morphine And Related Shortness Of Breath and Swelling    ROS: A comprehensive review of systems was negative except for: Cardiovascular: positive for lower extremity edema and palpitations  HOME MEDS: Current Outpatient Prescriptions  Medication Sig Dispense Refill  . ALPRAZolam (XANAX) 1 MG tablet Take 1 tablet (1 mg total) by mouth at bedtime as needed. sleep 30 tablet 1  . aspirin EC 81 MG tablet Take 81 mg by mouth daily.     Marland Kitchen diltiazem (CARDIZEM CD) 120 MG 24 hr capsule Take 1 capsule (120 mg total) by mouth daily. 90 capsule 3  . metoprolol succinate (TOPROL-XL) 50 MG 24 hr tablet Take 1 tablet (50 mg total) by mouth daily. Take with or immediately following a meal. 135 tablet 3  . valsartan-hydrochlorothiazide (DIOVAN-HCT) 160-25 MG tablet Take 1 tablet by mouth daily. 90 tablet 3   No current facility-administered medications for this visit.    LABS/IMAGING: No results found for this or any previous visit (from the past 48 hour(s)). No results found.  VITALS: BP  118/78 mmHg  Pulse 74  Ht 6\' 4"  (1.93 m)  Wt 394 lb (178.717 kg)  BMI 47.98 kg/m2  EXAM: General appearance: alert, no distress and morbidly obese Lungs: clear to auscultation bilaterally Heart: regular rate and rhythm, S1, S2 normal, no murmur, click, rub or gallop Extremities: edema Trace bilateral Psych: Pleasant  EKG: Normal sinus rhythm at 74, First-degree AV block, RBBB  ASSESSMENT: 1. Paroxysmal atrial fibrillation - recurrent (CHADSVASC score of 1) 2. Obstructive sleep apnea on BIPAP 3. Hypertension 4. RBBB 5. Mild to moderate aortic stenosis/regurgitation, EF 60-65% 6. Right bundle-branch block 7. Mild aortic root dilatation 8. Morbid obesity 9. Symptomatic reflux of the left greater saphenous vein  PLAN: 1.   Mr. Mees continues  to have recurrent PAF which is highly symptomatic. His CHADSVASC score 1, however he does have significant structural heart disease including aortic root dilatation and moderate aortic stenosis by his last echocardiogram. Given his structural heart disease, he is not a candidate for the 1C antiarrhythmic therapies. He may be candidate for Tikosyn. We discussed this at length last office visit and again today. He says that he feels like he may need to go that direction at this point. He will contact the office when he is ready to be admitted for loading.  [name], MD, [facility] Attending Cardiologist [place] Sterbenz 10/05/2015, 7:37 AM

## 2015-10-06 ENCOUNTER — Telehealth: Payer: Self-pay | Admitting: Internal Medicine

## 2015-10-06 NOTE — Telephone Encounter (Signed)
Pt calling requesting another Rx be sent into his pharmacy because his insurance will not cover a 90 day supply and pt stated that Dr. Debara Pickett said that he could take a extra tablet of the Metoprolol 50 mg if needed and that it was not specified on the script. Pt would like a call back. Please advise

## 2015-10-07 MED ORDER — METOPROLOL SUCCINATE ER 50 MG PO TB24: 50.0000 mg | ORAL_TABLET | Freq: Every day | ORAL | Status: DC

## 2015-10-07 MED ORDER — VALSARTAN-HYDROCHLOROTHIAZIDE 160-25 MG PO TABS: 1.0000 | ORAL_TABLET | Freq: Every day | ORAL | Status: DC

## 2015-10-07 NOTE — Telephone Encounter (Signed)
Rx(s) sent to pharmacy electronically.  

## 2016-04-10 ENCOUNTER — Other Ambulatory Visit: Payer: Self-pay

## 2016-04-10 ENCOUNTER — Ambulatory Visit (INDEPENDENT_AMBULATORY_CARE_PROVIDER_SITE_OTHER): Payer: 59 | Admitting: Family Medicine

## 2016-04-10 ENCOUNTER — Encounter: Payer: Self-pay | Admitting: Family Medicine

## 2016-04-10 VITALS — BP 120/70 | HR 81 | Temp 98.9°F | Wt 362.0 lb

## 2016-04-10 DIAGNOSIS — Z720 Tobacco use: Secondary | ICD-10-CM

## 2016-04-10 DIAGNOSIS — F419 Anxiety disorder, unspecified: Secondary | ICD-10-CM

## 2016-04-10 DIAGNOSIS — J0110 Acute frontal sinusitis, unspecified: Secondary | ICD-10-CM

## 2016-04-10 DIAGNOSIS — M25511 Pain in right shoulder: Secondary | ICD-10-CM | POA: Diagnosis not present

## 2016-04-10 MED ORDER — ALPRAZOLAM 1 MG PO TABS: 1.0000 mg | ORAL_TABLET | Freq: Every evening | ORAL | 1 refills | Status: DC | PRN

## 2016-04-10 MED ORDER — METHOCARBAMOL 500 MG PO TABS: 500.0000 mg | ORAL_TABLET | Freq: Four times a day (QID) | ORAL | 1 refills | Status: DC

## 2016-04-10 MED ORDER — AMOXICILLIN-POT CLAVULANATE 875-125 MG PO TABS: 1.0000 | ORAL_TABLET | Freq: Two times a day (BID) | ORAL | 0 refills | Status: DC

## 2016-04-10 NOTE — Patient Instructions (Addendum)
Take all the antibiotic and if not totally back to normal call me as no giving more. Use the Flonase regularly. 2 squirts in each nostril once a day. Take 2 Aleve twice per day and you can also take 2 Tylenol 4 times a day

## 2016-04-10 NOTE — Progress Notes (Signed)
   Subjective:    Patient ID: David [name], male    DOB: December 17, 1955, 60 y.o.   MRN: [id number]  HPI He is here for evaluation of difficulty since Sunday with a frontal headache, increased nasal congestion and PND. Yesterday the pain increased and it was behind both eyes. He also complained of upper to discomfort. The drainage and congestion have gotten worse. He does have a history of allergies and has used Zyrtec D in the past but had to stop the decongestant component due to A. fib. He does smoke and is not interested in quitting. He also does have intermittent difficulty with anxiety and uses Xanax very very sparingly. He's had right shoulder surgery and does require the use of Robaxin on an as-needed basis for muscle spasms. The surgery was done by Dr. Amedeo Plenty.   Review of Systems     Objective:   Physical Exam Alert and in no distress. Nasal mucosa is slightly red with tenderness especially over frontal sinuses and to a lesser extent over maxillary sinuses. Tympanic membranes and canals are normal. Pharyngeal area is normal. Neck is supple without adenopathy or thyromegaly. Cardiac exam shows a regular sinus rhythm without murmurs or gallops. Lungs are clear to auscultation.        Assessment & Plan:  Acute frontal sinusitis, recurrence not specified - Plan: amoxicillin-clavulanate (AUGMENTIN) 875-125 MG tablet  Tobacco abuse  Right shoulder pain - Plan: methocarbamol (ROBAXIN) 500 MG tablet  Anxiety - Plan: ALPRAZolam (XANAX) 1 MG tablet He is to call me when he finishes the antibiotic if not totally back to normal.

## 2016-06-20 DIAGNOSIS — I639 Cerebral infarction, unspecified: Secondary | ICD-10-CM

## 2016-06-20 HISTORY — DX: Cerebral infarction, unspecified: I63.9

## 2016-06-21 ENCOUNTER — Emergency Department (HOSPITAL_COMMUNITY): Payer: 59

## 2016-06-21 ENCOUNTER — Observation Stay (HOSPITAL_COMMUNITY): Payer: 59

## 2016-06-21 ENCOUNTER — Encounter (HOSPITAL_COMMUNITY): Payer: Self-pay

## 2016-06-21 ENCOUNTER — Inpatient Hospital Stay (HOSPITAL_COMMUNITY)
Admission: EM | Admit: 2016-06-21 | Discharge: 2016-06-23 | DRG: 065 | Disposition: A | Discharge status: Home / Self Care | Payer: 59 | Attending: Internal Medicine | Admitting: Internal Medicine

## 2016-06-21 DIAGNOSIS — F17210 Nicotine dependence, cigarettes, uncomplicated: Secondary | ICD-10-CM | POA: Diagnosis present

## 2016-06-21 DIAGNOSIS — R4701 Aphasia: Secondary | ICD-10-CM | POA: Diagnosis not present

## 2016-06-21 DIAGNOSIS — E785 Hyperlipidemia, unspecified: Secondary | ICD-10-CM | POA: Diagnosis present

## 2016-06-21 DIAGNOSIS — R2990 Unspecified symptoms and signs involving the nervous system: Secondary | ICD-10-CM

## 2016-06-21 DIAGNOSIS — G4733 Obstructive sleep apnea (adult) (pediatric): Secondary | ICD-10-CM | POA: Diagnosis present

## 2016-06-21 DIAGNOSIS — Z7982 Long term (current) use of aspirin: Secondary | ICD-10-CM

## 2016-06-21 DIAGNOSIS — I6340 Cerebral infarction due to embolism of unspecified cerebral artery: Principal | ICD-10-CM | POA: Diagnosis present

## 2016-06-21 DIAGNOSIS — I63412 Cerebral infarction due to embolism of left middle cerebral artery: Secondary | ICD-10-CM | POA: Diagnosis present

## 2016-06-21 DIAGNOSIS — Z87891 Personal history of nicotine dependence: Secondary | ICD-10-CM | POA: Diagnosis present

## 2016-06-21 DIAGNOSIS — I480 Paroxysmal atrial fibrillation: Secondary | ICD-10-CM | POA: Diagnosis present

## 2016-06-21 DIAGNOSIS — G459 Transient cerebral ischemic attack, unspecified: Secondary | ICD-10-CM

## 2016-06-21 DIAGNOSIS — Z6841 Body Mass Index (BMI) 40.0 and over, adult: Secondary | ICD-10-CM

## 2016-06-21 DIAGNOSIS — R55 Syncope and collapse: Secondary | ICD-10-CM

## 2016-06-21 DIAGNOSIS — Z8673 Personal history of transient ischemic attack (TIA), and cerebral infarction without residual deficits: Secondary | ICD-10-CM | POA: Diagnosis present

## 2016-06-21 DIAGNOSIS — K219 Gastro-esophageal reflux disease without esophagitis: Secondary | ICD-10-CM | POA: Diagnosis present

## 2016-06-21 DIAGNOSIS — I10 Essential (primary) hypertension: Secondary | ICD-10-CM | POA: Diagnosis present

## 2016-06-21 DIAGNOSIS — I350 Nonrheumatic aortic (valve) stenosis: Secondary | ICD-10-CM | POA: Diagnosis present

## 2016-06-21 DIAGNOSIS — Z79899 Other long term (current) drug therapy: Secondary | ICD-10-CM

## 2016-06-21 DIAGNOSIS — Z8249 Family history of ischemic heart disease and other diseases of the circulatory system: Secondary | ICD-10-CM

## 2016-06-21 DIAGNOSIS — Z23 Encounter for immunization: Secondary | ICD-10-CM

## 2016-06-21 LAB — I-STAT CHEM 8, ED
BUN: 20 mg/dL (ref 6–20)
CALCIUM ION: 1.04 mmol/L — AB (ref 1.15–1.40)
Chloride: 104 mmol/L (ref 101–111)
Creatinine, Ser: 1 mg/dL (ref 0.61–1.24)
GLUCOSE: 112 mg/dL — AB (ref 65–99)
HCT: 45 % (ref 39.0–52.0)
HEMOGLOBIN: 15.3 g/dL (ref 13.0–17.0)
Potassium: 3.6 mmol/L (ref 3.5–5.1)
Sodium: 139 mmol/L (ref 135–145)
TCO2: 22 mmol/L (ref 0–100)

## 2016-06-21 LAB — COMPREHENSIVE METABOLIC PANEL
ALK PHOS: 72 U/L (ref 38–126)
ALT: 23 U/L (ref 17–63)
ANION GAP: 8 (ref 5–15)
AST: 22 U/L (ref 15–41)
Albumin: 3.4 g/dL — ABNORMAL LOW (ref 3.5–5.0)
BILIRUBIN TOTAL: 0.6 mg/dL (ref 0.3–1.2)
BUN: 17 mg/dL (ref 6–20)
CALCIUM: 8.9 mg/dL (ref 8.9–10.3)
CO2: 23 mmol/L (ref 22–32)
Chloride: 106 mmol/L (ref 101–111)
Creatinine, Ser: 0.99 mg/dL (ref 0.61–1.24)
GFR calc non Af Amer: >60 mL/min (ref >60)
Glucose, Bld: 111 mg/dL — ABNORMAL HIGH (ref 65–99)
Potassium: 3.6 mmol/L (ref 3.5–5.1)
SODIUM: 137 mmol/L (ref 135–145)
TOTAL PROTEIN: 6.6 g/dL (ref 6.5–8.1)

## 2016-06-21 LAB — CBC
HCT: 45.2 % (ref 39.0–52.0)
Hemoglobin: 15.2 g/dL (ref 13.0–17.0)
MCH: 31 pg (ref 26.0–34.0)
MCHC: 33.6 g/dL (ref 30.0–36.0)
MCV: 92.1 fL (ref 78.0–100.0)
PLATELETS: 230 10*3/uL (ref 150–400)
RBC: 4.91 MIL/uL (ref 4.22–5.81)
RDW: 14.2 % (ref 11.5–15.5)
WBC: 8 10*3/uL (ref 4.0–10.5)

## 2016-06-21 LAB — CBG MONITORING, ED: GLUCOSE-CAPILLARY: 101 mg/dL — AB (ref 65–99)

## 2016-06-21 LAB — DIFFERENTIAL
Basophils Absolute: 0.1 10*3/uL (ref 0.0–0.1)
Basophils Relative: 1 %
EOS PCT: 3 %
Eosinophils Absolute: 0.3 10*3/uL (ref 0.0–0.7)
LYMPHS PCT: 27 %
Lymphs Abs: 2.2 10*3/uL (ref 0.7–4.0)
MONO ABS: 0.8 10*3/uL (ref 0.1–1.0)
Monocytes Relative: 10 %
Neutro Abs: 4.7 10*3/uL (ref 1.7–7.7)
Neutrophils Relative %: 59 %

## 2016-06-21 LAB — PROTIME-INR
INR: 1.01
PROTHROMBIN TIME: 13.3 s (ref 11.4–15.2)

## 2016-06-21 LAB — APTT: aPTT: 31 seconds (ref 24–36)

## 2016-06-21 LAB — I-STAT TROPONIN, ED: TROPONIN I, POC: 0 ng/mL (ref 0.00–0.08)

## 2016-06-21 MED ORDER — ATORVASTATIN CALCIUM 10 MG PO TABS
10.0000 mg | ORAL_TABLET | Freq: Every day | ORAL | Status: DC
  Administered 2016-06-21 – 2016-06-22 (×2): 10 mg via ORAL
  Filled 2016-06-21 (×2): qty 1

## 2016-06-21 MED ORDER — METHOCARBAMOL 500 MG PO TABS: 500.0000 mg | ORAL_TABLET | Freq: Three times a day (TID) | ORAL | Status: DC | PRN

## 2016-06-21 MED ORDER — ASPIRIN 300 MG RE SUPP: 300.0000 mg | Freq: Every day | RECTAL | Status: DC

## 2016-06-21 MED ORDER — SODIUM CHLORIDE 0.9 % IV SOLN
INTRAVENOUS | Status: AC
  Administered 2016-06-21: 16:00:00 via INTRAVENOUS

## 2016-06-21 MED ORDER — PNEUMOCOCCAL VAC POLYVALENT 25 MCG/0.5ML IJ INJ
0.5000 mL | INJECTION | INTRAMUSCULAR | Status: AC
  Administered 2016-06-22: 0.5 mL via INTRAMUSCULAR
  Filled 2016-06-21: qty 0.5

## 2016-06-21 MED ORDER — IOPAMIDOL (ISOVUE-370) INJECTION 76%
INTRAVENOUS | Status: AC
  Administered 2016-06-21: 100 mL
  Filled 2016-06-21: qty 100

## 2016-06-21 MED ORDER — ASPIRIN 325 MG PO TABS
325.0000 mg | ORAL_TABLET | Freq: Every day | ORAL | Status: DC
  Administered 2016-06-21 – 2016-06-23 (×3): 325 mg via ORAL
  Filled 2016-06-21 (×3): qty 1

## 2016-06-21 MED ORDER — STROKE: EARLY STAGES OF RECOVERY BOOK
Freq: Once | Status: AC
  Administered 2016-06-21: 16:00:00
  Filled 2016-06-21: qty 1

## 2016-06-21 MED ORDER — ENOXAPARIN SODIUM 40 MG/0.4ML ~~LOC~~ SOLN
40.0000 mg | SUBCUTANEOUS | Status: DC
  Administered 2016-06-21 – 2016-06-22 (×2): 40 mg via SUBCUTANEOUS
  Filled 2016-06-21 (×2): qty 0.4

## 2016-06-21 MED ORDER — SENNOSIDES-DOCUSATE SODIUM 8.6-50 MG PO TABS: 1.0000 | ORAL_TABLET | Freq: Every evening | ORAL | Status: DC | PRN

## 2016-06-21 MED ORDER — INFLUENZA VAC SPLIT QUAD 0.5 ML IM SUSY
0.5000 mL | PREFILLED_SYRINGE | INTRAMUSCULAR | Status: AC
  Administered 2016-06-22: 0.5 mL via INTRAMUSCULAR
  Filled 2016-06-21: qty 0.5

## 2016-06-21 MED ORDER — ALPRAZOLAM 0.25 MG PO TABS: 0.2500 mg | ORAL_TABLET | Freq: Two times a day (BID) | ORAL | Status: DC | PRN

## 2016-06-21 MED ORDER — ALPRAZOLAM 0.5 MG PO TABS: 1.0000 mg | ORAL_TABLET | Freq: Every evening | ORAL | Status: DC | PRN

## 2016-06-21 MED ORDER — DILTIAZEM HCL ER COATED BEADS 120 MG PO CP24
120.0000 mg | ORAL_CAPSULE | Freq: Every day | ORAL | Status: DC
Start: 2016-06-21 — End: 2016-06-21
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
## 2016-06-21 NOTE — ED Notes (Addendum)
Pt aphasia appears to be returning.  Pt also verbalizes increased difficulty finding words.  Janett Billow RN with stroke team called and will come assess pt.  Pt tearful upon assessment.

## 2016-06-21 NOTE — Code Documentation (Signed)
Called by ED RN to reassess patient for worsening expressive aphasia.  NIHSS completed yielding a 1.  Patient with difficulty with naming some pictures on NIHSS cards and occasional word hesitation/substitution.  Patient very tearful on exam and frustrated.  Dr. Cristobal Goldmann paged and to the bedside.  No acute stroke treatment per MD.  Mosetta Anis with ED RN Junie Panning.

## 2016-06-21 NOTE — ED Notes (Signed)
Called pt employer to notify that he is here per pt request

## 2016-06-21 NOTE — ED Notes (Signed)
Neurology at bedside.

## 2016-06-21 NOTE — Consult Note (Signed)
Requesting Physician: Dr. Sherry Ruffing    Chief Complaint: code stroke  History obtained from:  Patient     HPI:                                                                                                                                         David [name] is an 60 y.o. male with hx as below and new onset afib which he found out last week, who presents acute onset aphasia and right hand numbness.  Date last known well: 06/21/16 Time last known well: 9am tPA Given: No: rapidly resolving symptoms. NIHS 6 to 1   Past Medical History:  Diagnosis Date  . Allergy    RHINITIS  . Arthritis    thumb  . GERD (gastroesophageal reflux disease)    "not really"  . History of echocardiogram 03/29/2009   EF 50-55%; mild concentric LVH;   . History of nuclear stress test 08/28/2007   moderate diaphragmatic attenuation; EKG neg for ischemia   . Hyperlipidemia   . Hypertension    takes Diovan and Toprol  . Obesity   . PAF (paroxysmal atrial fibrillation) (HCC)    associated with OSA  . Right bundle branch block   . Rosacea   . Shortness of breath    With activity  . Sleep apnea    CPAP  . Smoker   . Systolic murmur     Past Surgical History:  Procedure Laterality Date  . FINGER SURGERY     Reconstruction of Left thumb and L index finger.  Reconstruction   . KNEE ARTHROSCOPY    . SHOULDER CLOSED REDUCTION     Procedure: CLOSED REDUCTION SHOULDER;  Surgeon: Lawrence Santiago [facility];  Location: [place];  Service: Orthopedics;  Laterality: Right;  . SHOULDER OPEN ROTATOR CUFF REPAIR  08/11/2011   Procedure: ROTATOR CUFF REPAIR SHOULDER OPEN;  Surgeon: Willa Frater III;  Location: [place];  Service: Orthopedics;  Laterality: Right;  . TONSILLECTOMY     as a child    Family History  Problem Relation Age of Onset  . Heart disease Mother   . Cancer - Prostate Maternal Grandfather   . Cancer - Prostate Paternal Grandfather   . Cancer - Colon Father   . Heart attack Father 36  . Cancer  Paternal Uncle   . Anesthesia problems Neg Hx   . Stroke Neg Hx    Social History:  reports that he has been smoking Cigarettes.  He has smoked for the past 30.00 years. He has never used smokeless tobacco. He reports that he drinks alcohol. He reports that he does not use drugs.  Allergies:  Allergies  Allergen Reactions  . Codeine Shortness Of Breath and Swelling  . Morphine And Related Shortness Of Breath and Swelling    Medications:  I have reviewed the patient's current medications.  ROS:                                                                                                                                       History obtained from chart review and the patient  General ROS: negative for - chills, fatigue, fever, night sweats, weight gain or weight loss Psychological ROS: negative for - behavioral disorder, hallucinations, memory difficulties, mood swings or suicidal ideation Ophthalmic ROS: negative for - blurry vision, double vision, eye pain or loss of vision ENT ROS: negative for - epistaxis, nasal discharge, oral lesions, sore throat, tinnitus or vertigo Allergy and Immunology ROS: negative for - hives or itchy/watery eyes Hematological and Lymphatic ROS: negative for - bleeding problems, bruising or swollen lymph nodes Endocrine ROS: negative for - galactorrhea, hair pattern changes, polydipsia/polyuria or temperature intolerance Respiratory ROS: negative for - cough, hemoptysis, shortness of breath or wheezing Cardiovascular ROS: negative for - chest pain, dyspnea on exertion, edema or irregular heartbeat Gastrointestinal ROS: negative for - abdominal pain, diarrhea, hematemesis, nausea/vomiting or stool incontinence Genito-Urinary ROS: negative for - dysuria, hematuria, incontinence or urinary frequency/urgency Musculoskeletal ROS: negative for  - joint swelling or muscular weakness Neurological ROS: as noted in HPI Dermatological ROS: negative for rash and skin lesion changes  Neurologic Examination:                                                                                                      Blood pressure 138/69, pulse 77, temperature 97.8 F (36.6 C), temperature source Oral, resp. rate 15, height 6\' 4"  (1.93 m), weight (!) 169.8 kg (374 lb 5.5 oz), SpO2 96 %.  HEENT-  Normocephalic, no lesions, without obvious abnormality.  Normal external eye and conjunctiva.  Normal TM's bilaterally.  Normal auditory canals and external ears. Normal external nose, mucus membranes and septum.  Normal pharynx. Cardiovascular- regular rate and rhythm, S1, S2 normal, no murmur, click, rub or gallop, pulses palpable throughout   Lungs- chest clear, no wheezing, rales, normal symmetric air entry, Heart exam - S1, S2 normal, no murmur, no gallop, rate regular Abdomen- soft, non-tender; bowel sounds normal; no masses,  no organomegaly   Neurological Examination Mental Status: Alert, oriented, thought content appropriate.  Speech fluent with evidence of mild expressive aphasia.   Cranial Nerves: II: Visual fields grossly normal, pupils equal, round, reactive to light and accommodation III,IV, VI: ptosis not present, extra-ocular motions intact bilaterally V,VII: smile symmetric, facial  light touch sensation normal bilaterally VIII: hearing normal bilaterally IX,X: uvula rises symmetrically XI: bilateral shoulder shrug XII: midline tongue extension Motor: Right : Upper extremity   5/5    Left:     Upper extremity   5/5  Lower extremity   5/5     Lower extremity   5/5 Tone and bulk:normal tone throughout; no atrophy noted Sensory: Pinprick and light touch intact throughout, bilaterally  Cerebellar: normal finger-to-nose        Lab Results: Basic Metabolic Panel:  Recent Labs Lab 06/21/16 1103 06/21/16 1115  NA 137 139  K 3.6  3.6  CL 106 104  CO2 23  --   GLUCOSE 111* 112*  BUN 17 20  CREATININE 0.99 1.00  CALCIUM 8.9  --     Liver Function Tests:  Recent Labs Lab 06/21/16 1103  AST 22  ALT 23  ALKPHOS 72  BILITOT 0.6  PROT 6.6  ALBUMIN 3.4*   No results for input(s): LIPASE, AMYLASE in the last 168 hours. No results for input(s): AMMONIA in the last 168 hours.  CBC:  Recent Labs Lab 06/21/16 1103 06/21/16 1115  WBC 8.0  --   NEUTROABS 4.7  --   HGB 15.2 15.3  HCT 45.2 45.0  MCV 92.1  --   PLT 230  --     Cardiac Enzymes: No results for input(s): CKTOTAL, CKMB, CKMBINDEX, TROPONINI in the last 168 hours.  Lipid Panel: No results for input(s): CHOL, TRIG, HDL, CHOLHDL, VLDL, LDLCALC in the last 168 hours.  CBG:  Recent Labs Lab 06/21/16 1110  GLUCAP 101*    Microbiology: Results for orders placed or performed during the hospital encounter of 05/07/15  MRSA PCR Screening     Status: None   Collection Time: 05/08/15  3:00 AM  Result Value Ref Range Status   MRSA by PCR NEGATIVE NEGATIVE Final    Comment:        The GeneXpert MRSA Assay (FDA approved for NASAL specimens only), is one component of a comprehensive MRSA colonization surveillance program. It is not intended to diagnose MRSA infection nor to guide or monitor treatment for MRSA infections.     Coagulation Studies:  Recent Labs  06/21/16 1103  LABPROT 13.3  INR 1.01    Imaging: Ct Head Code Stroke Wo Contrast`  Result Date: 06/21/2016 CLINICAL DATA:  Code stroke. Right sided numbness. Expressive aphasia. EXAM: CT HEAD WITHOUT CONTRAST TECHNIQUE: Contiguous axial images were obtained from the base of the skull through the vertex without intravenous contrast. COMPARISON:  07/06/2011 FINDINGS: Brain: Normal appearance without evidence of old or acute infarction, mass lesion, hemorrhage, hydrocephalus or extra-axial collection. Vascular: No hyperdense vessels. Skull: Negative Sinuses/Orbits: Extensive  sinus opacification on the left consistent with carina sinusitis. Other: None significant ASPECTS ([place] Stroke Program Early CT Score) - Ganglionic level infarction (caudate, lentiform nuclei, internal capsule, insula, M1-M3 cortex): 7 - Supraganglionic infarction (M4-M6 cortex): 3 Total score (0-10 with 10 being normal): 10 IMPRESSION: 1. Normal head CT with the exception of extensive paranasal sinusitis on the left. 2. ASPECTS is 10 These results were called by telephone at the time of interpretation on 06/21/2016 at 11:23 am to Dr. Cristobal Goldmann , who verbally acknowledged these results. Electronically Signed   By: Nelson Chimes M.D.   On: 06/21/2016 11:26         Assessment: 60 y.o. male with hx as below and new onset afib which he found out last week, who presents acute  onset aphasia and right hand numbness.  1. HgbA1c, fasting lipid panel 2. MRI, MRA  of the brain without contrast 3. PT consult, OT consult, Speech consult 4. Echocardiogram 5. Carotid dopplers 6. Prophylactic therapy-Antiplatelet med: Aspirin - dose 325 7. Risk factor modification 8. Telemetry monitoring 9. Frequent neuro checks 10 NPO until passes stroke swallow screen 11 please page stroke NP  Or  PA  Or MD from 8am -4 pm  as this patient from this time will be  followed by the stroke.   You can look them up on [email or web address]  Password [email or web address]   - Most likely threw a small embolic stroke from afib to a distal M3 cortical branch in the L frontal parietal lobe. No TPA due to rapidly resolving symptoms and low NIHS  - After MRI, of tiny to small stroke, will need to be started on [facility]   Stroke Risk Factors - atrial fibrillation

## 2016-06-21 NOTE — Progress Notes (Signed)
Pt arrived to 5C08 via stretcher, Pt ambulated to bed from stretcher.  Pt alert, aphasic, tearful, states " I don't know what's wrong with me".  VSS.  Will continue to monitor.  Cori Razor, RN

## 2016-06-21 NOTE — ED Notes (Signed)
Stroke team at bedside
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
## 2016-06-22 ENCOUNTER — Other Ambulatory Visit (HOSPITAL_COMMUNITY): Payer: 59

## 2016-06-22 ENCOUNTER — Observation Stay (HOSPITAL_COMMUNITY): Payer: 59

## 2016-06-22 DIAGNOSIS — I6340 Cerebral infarction due to embolism of unspecified cerebral artery: Secondary | ICD-10-CM | POA: Diagnosis present

## 2016-06-22 DIAGNOSIS — I6789 Other cerebrovascular disease: Secondary | ICD-10-CM | POA: Diagnosis not present

## 2016-06-22 DIAGNOSIS — F17210 Nicotine dependence, cigarettes, uncomplicated: Secondary | ICD-10-CM | POA: Diagnosis present

## 2016-06-22 DIAGNOSIS — I63412 Cerebral infarction due to embolism of left middle cerebral artery: Secondary | ICD-10-CM | POA: Diagnosis present

## 2016-06-22 DIAGNOSIS — Z6841 Body Mass Index (BMI) 40.0 and over, adult: Secondary | ICD-10-CM | POA: Diagnosis not present

## 2016-06-22 DIAGNOSIS — E785 Hyperlipidemia, unspecified: Secondary | ICD-10-CM | POA: Diagnosis present

## 2016-06-22 DIAGNOSIS — Z7982 Long term (current) use of aspirin: Secondary | ICD-10-CM | POA: Diagnosis not present

## 2016-06-22 DIAGNOSIS — I480 Paroxysmal atrial fibrillation: Secondary | ICD-10-CM | POA: Diagnosis present

## 2016-06-22 DIAGNOSIS — G4733 Obstructive sleep apnea (adult) (pediatric): Secondary | ICD-10-CM

## 2016-06-22 DIAGNOSIS — I10 Essential (primary) hypertension: Secondary | ICD-10-CM | POA: Diagnosis present

## 2016-06-22 DIAGNOSIS — R2990 Unspecified symptoms and signs involving the nervous system: Secondary | ICD-10-CM

## 2016-06-22 DIAGNOSIS — K219 Gastro-esophageal reflux disease without esophagitis: Secondary | ICD-10-CM | POA: Diagnosis present

## 2016-06-22 DIAGNOSIS — Z8673 Personal history of transient ischemic attack (TIA), and cerebral infarction without residual deficits: Secondary | ICD-10-CM | POA: Diagnosis present

## 2016-06-22 DIAGNOSIS — I350 Nonrheumatic aortic (valve) stenosis: Secondary | ICD-10-CM | POA: Diagnosis present

## 2016-06-22 DIAGNOSIS — R4701 Aphasia: Secondary | ICD-10-CM | POA: Diagnosis present

## 2016-06-22 DIAGNOSIS — Z23 Encounter for immunization: Secondary | ICD-10-CM | POA: Diagnosis not present

## 2016-06-22 DIAGNOSIS — Z79899 Other long term (current) drug therapy: Secondary | ICD-10-CM | POA: Diagnosis not present

## 2016-06-22 DIAGNOSIS — Z8249 Family history of ischemic heart disease and other diseases of the circulatory system: Secondary | ICD-10-CM | POA: Diagnosis not present

## 2016-06-22 LAB — LIPID PANEL
CHOL/HDL RATIO: 6.8 ratio
Cholesterol: 176 mg/dL (ref 0–200)
HDL: 26 mg/dL — ABNORMAL LOW (ref >40)
LDL CALC: 128 mg/dL — AB (ref 0–99)
Triglycerides: 110 mg/dL (ref <150)
VLDL: 22 mg/dL (ref 0–40)

## 2016-06-22 LAB — HEMOGLOBIN A1C
Hgb A1c MFr Bld: 5.5 % (ref 4.8–5.6)
Mean Plasma Glucose: 111 mg/dL

## 2016-06-22 MED ORDER — METOPROLOL SUCCINATE ER 25 MG PO TB24
50.0000 mg | ORAL_TABLET | Freq: Every day | ORAL | Status: DC
  Administered 2016-06-22: 50 mg via ORAL
  Administered 2016-06-23: 25 mg via ORAL
  Filled 2016-06-22 (×2): qty 2

## 2016-06-22 NOTE — Evaluation (Signed)
Physical Therapy Evaluation and Discharge  Patient Details Name: David [name] MRN: [id number] DOB: [birth year] Today's Date: 06/22/2016   History of Present Illness  60 y.o. male with medical history significant for lytic stenosis, A. fib, morbid obesity, hypertension, hyperlipidemia tobacco user density emergency department with complaint of aphasia and right hand numbness. MRI --infarct left posterior insular and punctate foci in left posterior temporal lobe periventricular white matter   Clinical Impression  Patient evaluated by Physical Therapy with no further PT needs identified. All education has been completed and the patient has no further questions.  PT is signing off. Thank you for this referral.  Patient would like to complete OT and SLP evaluations despite apparent resolution of symptoms.     Follow Up Recommendations No PT follow up    Equipment Recommendations  None recommended by PT    Recommendations for Other Services OT consult;Speech consult (pt interested in consults 2/2 previous symptoms)     Precautions / Restrictions Precautions Precautions: None      Mobility  Bed Mobility Overal bed mobility: Modified Independent                Transfers Overall transfer level: Independent Equipment used: None                Ambulation/Gait Ambulation/Gait assistance: Independent Ambulation Distance (Feet): 250 Feet Assistive device: None Gait Pattern/deviations: WFL(Within Functional Limits);Wide base of support Gait velocity: good variability Gait velocity interpretation: at or above normal speed for age/gender    Stairs            Wheelchair Mobility    Modified Rankin (Stroke Patients Only) Modified Rankin (Stroke Patients Only) Pre-Morbid Rankin Score: No symptoms Modified Rankin: No symptoms     Balance Overall balance assessment: Independent                               Standardized Balance  Assessment Standardized Balance Assessment : Berg Balance Test;Dynamic Gait Index Berg Balance Test Sit to Stand: Able to stand without using hands and stabilize independently Standing Unsupported: Able to stand safely 2 minutes Sitting with Back Unsupported but Feet Supported on Floor or Stool: Able to sit safely and securely 2 minutes Stand to Sit: Sits safely with minimal use of hands Transfers: Able to transfer safely, minor use of hands Standing Unsupported with Eyes Closed: Able to stand 10 seconds safely Turn 360 Degrees: Able to turn 360 degrees safely in 4 seconds or less Standing on One Leg: Tries to lift leg/unable to hold 3 seconds but remains standing independently Dynamic Gait Index Level Surface: Normal Change in Gait Speed: Normal Gait with Horizontal Head Turns: Normal Gait with Vertical Head Turns: Normal Gait and Pivot Turn: Normal       Pertinent Vitals/Pain Pain Assessment: No/denies pain    Home Living Family/patient expects to be discharged to:: Private residence Living Arrangements: Alone Available Help at Discharge: Friend(s);Available PRN/intermittently Type of Home: House Home Access: Stairs to enter Entrance Stairs-Rails: Right Entrance Stairs-Number of Steps: 6 Home Layout: Two level;Bed/bath upstairs Home Equipment: Grab bars - tub/shower      Prior Function Level of Independence: Independent         Comments: Market researcher Dominance   Dominant Hand: Right    Extremity/Trunk Assessment   Upper Extremity Assessment: Defer to OT evaluation  Lower Extremity Assessment: Overall WFL for tasks assessed      Cervical / Trunk Assessment: Other exceptions  Communication   Communication: No difficulties  Cognition Arousal/Alertness: Awake/alert Behavior During Therapy: WFL for tasks assessed/performed Overall Cognitive Status: Within Functional Limits for tasks assessed                       General Comments      Exercises     Assessment/Plan    PT Assessment Patent does not need any further PT services  PT Problem List            PT Treatment Interventions      PT Goals (Current goals can be found in the Care Plan section)  Acute Rehab PT Goals PT Goal Formulation: All assessment and education complete, DC therapy    Frequency     Barriers to discharge        Co-evaluation               End of Session   Activity Tolerance: Patient tolerated treatment well Patient left: in bed;with call [place]/phone within reach      Functional Assessment Tool Used: clinical judgement Functional Limitation: Mobility: Walking and moving around Mobility: Walking and Moving Around Current Status [id number]): 0 percent impaired, limited or restricted Mobility: Walking and Moving Around Goal Status [phone]): 0 percent impaired, limited or restricted Mobility: Walking and Moving Around Discharge Status [phone]): 0 percent impaired, limited or restricted    Time: [id number] PT Time Calculation (min) (ACUTE ONLY): 22 min   Charges:   PT Evaluation $PT Eval Low Complexity: 1 Procedure     PT G Codes:   PT G-Codes **NOT FOR INPATIENT CLASS** Functional Assessment Tool Used: clinical judgement Functional Limitation: Mobility: Walking and moving around Mobility: Walking and Moving Around Current Status [id number]): 0 percent impaired, limited or restricted Mobility: Walking and Moving Around Goal Status [id number]): 0 percent impaired, limited or restricted Mobility: Walking and Moving Around Discharge Status [id number]): 0 percent impaired, limited or restricted    Fernanda Twaddell 06/22/2016, 9:07 AM Pager [phone]

## 2016-06-22 NOTE — Evaluation (Signed)
Occupational Therapy Evaluation Patient Details Name: David [name] MRN: [id number] DOB: 1956/04/28 Today's Date: 06/22/2016    History of Present Illness 60 y.o. male with medical history significant for lytic stenosis, A. fib, morbid obesity, hypertension, hyperlipidemia tobacco user density emergency department with complaint of aphasia and right hand numbness. MRI --infarct left posterior insular and punctate foci in left posterior temporal lobe periventricular white matter    Clinical Impression   Pt currently at modified independent level for all selfcare tasks.  Coordination and strength WFLs for the RUE at this time.  All symptoms resolved and no further OT needs at this time.      Follow Up Recommendations  No OT follow up          Precautions / Restrictions Precautions Precautions: None Restrictions Weight Bearing Restrictions: No      Mobility Bed Mobility Overal bed mobility: Modified Independent                Transfers Overall transfer level: Independent Equipment used: None                  Balance Overall balance assessment: Independent                               Standardized Balance Assessment Standardized Balance Assessment : Berg Balance Test;Dynamic Gait Index Berg Balance Test Sit to Stand: Able to stand without using hands and stabilize independently Standing Unsupported: Able to stand safely 2 minutes Sitting with Back Unsupported but Feet Supported on Floor or Stool: Able to sit safely and securely 2 minutes Stand to Sit: Sits safely with minimal use of hands Transfers: Able to transfer safely, minor use of hands Standing Unsupported with Eyes Closed: Able to stand 10 seconds safely Turn 360 Degrees: Able to turn 360 degrees safely in 4 seconds or less Standing on One Leg: Tries to lift leg/unable to hold 3 seconds but remains standing independently Dynamic Gait Index Level Surface: Normal Change in Gait Speed:  Normal Gait with Horizontal Head Turns: Normal Gait with Vertical Head Turns: Normal Gait and Pivot Turn: Normal      ADL Overall ADL's : Modified independent                                       General ADL Comments: Pt with history of left knee arthritis but compensates as needed.       Vision Vision Assessment?: No apparent visual deficits   Perception Perception Perception Tested?: No   Praxis Praxis Praxis tested?: Within functional limits    Pertinent Vitals/Pain Pain Assessment: No/denies pain     Hand Dominance Right   Extremity/Trunk Assessment Upper Extremity Assessment Upper Extremity Assessment: Overall WFL for tasks assessed   Lower Extremity Assessment Lower Extremity Assessment: Overall WFL for tasks assessed   Cervical / Trunk Assessment Cervical / Trunk Assessment: Normal Cervical / Trunk Exceptions: morbid obesity with limitations in reaching his left foot    Communication Communication Communication: No difficulties   Cognition Arousal/Alertness: Awake/alert Behavior During Therapy: WFL for tasks assessed/performed Overall Cognitive Status: Within Functional Limits for tasks assessed                                Home Living Family/patient expects to be discharged  to:: Private residence Living Arrangements: Alone Available Help at Discharge: Friend(s);Available PRN/intermittently Type of Home: House Home Access: Stairs to enter [employer] of Steps: 6 Entrance Stairs-Rails: Right Home Layout: Two level;Bed/bath upstairs Alternate Level Stairs-Number of Steps: 13 Alternate Level Stairs-Rails: Left Bathroom Shower/Tub: Walk-in shower;Tub/shower unit   Biochemist, clinical: Standard (downstairs handicap ht) Bathroom Accessibility: Yes   Home Equipment: Grab bars - tub/shower          Prior Functioning/Environment Level of Independence: Independent        Comments: Copy of Session Nurse Communication: Mobility status  Activity Tolerance: Patient tolerated treatment well Patient left: in bed;with call [place]/phone within reach   Time: 0920-0935 OT Time Calculation (min): 15 min Charges:  OT General Charges $OT Visit: 1 Procedure OT Evaluation $OT Eval Low Complexity: 1 Procedure G-Codes:    Hasana Alcorta 2016-07-03, 10:38 AM
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
## 2016-06-22 NOTE — Progress Notes (Signed)
STROKE TEAM PROGRESS NOTE   HISTORY OF PRESENT ILLNESS (per record) David [name] is an 60 y.o. male with hx of valvuar heart disease, A. fib, morbid obesity, hypertension, hyperlipidemia tobacco user with new onset afib which he found out last week, who presents acute onset aphasia and right hand numbness. He was last known well: 06/21/16 at 9am. Patient was not administered IV t-PA secondary to rapidly resolving symptoms. He was admitted for further evaluation and treatment.   SUBJECTIVE (INTERVAL HISTORY) His wife is at the bedside.  Overall he feels his condition is stable. He recounted his HPI with Dr Leonie Man.    OBJECTIVE Temp:  [97.7 F (36.5 C)-98.6 F (37 C)] 98.2 F (36.8 C) (11/03 1028) Pulse Rate:  [60-81] 62 (11/03 1028) Cardiac Rhythm: Bundle branch block;Heart block (11/03 0700) Resp:  [12-22] 18 (11/03 1028) BP: (110-141)/(52-77) 132/77 (11/03 1028) SpO2:  [95 %-100 %] 95 % (11/03 1028) Weight:  [169.8 kg (374 lb 5.5 oz)] 169.8 kg (374 lb 5.5 oz) (11/02 1150)  CBC:   Recent Labs Lab 06/21/16 1103 06/21/16 1115  WBC 8.0  --   NEUTROABS 4.7  --   HGB 15.2 15.3  HCT 45.2 45.0  MCV 92.1  --   PLT 230  --     Basic Metabolic Panel:   Recent Labs Lab 06/21/16 1103 06/21/16 1115  NA 137 139  K 3.6 3.6  CL 106 104  CO2 23  --   GLUCOSE 111* 112*  BUN 17 20  CREATININE 0.99 1.00  CALCIUM 8.9  --     Lipid Panel:     Component Value Date/Time   CHOL 176 06/22/2016 0318   TRIG 110 06/22/2016 0318   HDL 26 (L) 06/22/2016 0318   CHOLHDL 6.8 06/22/2016 0318   VLDL 22 06/22/2016 0318   LDLCALC 128 (H) 06/22/2016 0318   HgbA1c:  Lab Results  Component Value Date   HGBA1C 5.5 06/21/2016   Urine Drug Screen: No results found for: LABOPIA, COCAINSCRNUR, LABBENZ, AMPHETMU, THCU, LABBARB    IMAGING  Ct Angio Head W Or Wo Contrast Ct Angio Neck W And/or Wo Contrast 06/21/2016 No significant carotid or vertebral artery stenosis in the neck. Negative for  emergent large vessel occlusion. Hypoperfusion left temporoparietal lobe based on perfusion imaging. There is possible branch occlusion M3 branch in this area.   Mr Brain Wo Contrast 06/22/2016 1. Small foci of cortical diffusion restriction within the left posterior insular and additional punctate foci in left posterior temporal lobe periventricular white matter compatible with areas of acute/early subacute infarction. No hemorrhage identified. 2. Mild chronic microvascular ischemic changes and parenchymal volume loss of the brain. 3. Left frontal, anterior ethmoid, and maxillary sinus opacification with a middle meatus obstructive pattern, direct visualization is recommended.   Ct Cerebral Perfusion W Contrast  06/21/2016 Hypoperfusion left temporal parietal lobe without core infarct. T-max greater than 6 second volume 34 mL.  Ct Head Code Stroke Wo Contrast` 06/21/2016 1. Normal head CT with the exception of extensive paranasal sinusitis on the left. 2. ASPECTS is 10   PHYSICAL EXAM Obese middle aged Caucasian male not in distress. . Afebrile. Head is nontraumatic. Neck is supple without bruit.    Cardiac exam no murmur or gallop. Lungs are clear to auscultation. Distal pulses are well felt. Neurological Exam ;  Awake  Alert oriented x 3. Normal speech and language.eye movements full without nystagmus.fundi were not visualized. Vision acuity and fields appear normal. Hearing is normal.  Palatal movements are normal. Face symmetric. Tongue midline. Normal strength, tone, reflexes and coordination. Normal sensation. Gait deferred.  ASSESSMENT/PLAN [name] is a 60 y.o. male with history of morbid obesity, HTN, HLD, tobacco use presenting with aphasia and R hand numbness, ? Field cut. He did not receive IV t-PA due to rapidly resolving symptoms.   Stroke:  2 small left brain infarcts, insular and temporal lobe, infarcts felt to be embolic secondary to known atrial  fibrillation  Resultant  Aphasia and R numbness resolved  MRI  2 small left brain infarcts, insular and temporal lobe  CTA H&N  No significant stenosis, possible L M3 occlusion  2D Echo  pending   LDL 128  HgbA1c 5.5  Lovenox 40 mg sq daily for VTE prophylaxis Diet Heart Room service appropriate? Yes; Fluid consistency: Thin  aspirin 81 mg daily prior to admission, now on aspirin 325 mg daily  Patient counseled to be compliant with his antithrombotic medications  Ongoing aggressive stroke risk factor management  Therapy recommendations:  No OT, no PT  Disposition:  Return home  Atrial Fibrillation, valvular  Home anticoagulation:  none   Recommend anticoagulation, possible addition of DOAC for atrial fibrillation / secondary stroke prevention. Dr. Leonie Man to discuss with Dr. Caro Laroche given valvular AF origin     Hypertension  Stable Long-term BP goal normotensive  Hyperlipidemia  Home meds:  No statin  LDL 128, goal < 70  Now on lipitor 10 mg daily  Other Stroke Risk Factors  Cigarette smoker, advised to stop smoking  ETOH use, advised to drink no more than 2 drink(s) a day  Morbid Obesity, Body mass index is 45.57 kg/m., recommend weight loss, diet and exercise as appropriate   Obstructive sleep apnea, on CPAP at home  Valvular heart disease and structural heart disease, followed by Dr[facility] day # 0  Radene Journey [facility] for Pager information 06/22/2016 11:12 AM  I have personally examined this patient, reviewed notes, independently viewed imaging studies, participated in medical decision making and plan of care.ROS completed by me personally and pertinent positives fully documented  I have made any additions or clarifications directly to the above note. Agree with note above.  The patient presented with transient aphasia and right hand numbness due to left MCA branch infarcts from atrial fibrillation and needs  long-term anticoagulation but has valvular atrial fibrillation. Will need to discuss his plan for anticoagulation the cardiologist Dr. Debara Pickett and make a final decision prior to discharge. Greater than 50% time during this 35 minute visit was spent on counseling and coordination of care about recurrent stroke risk, prevention and treatment  Antony Contras, MD Medical Director [facility] Pager: [phone] 06/22/2016 4:53 PM  To contact Stroke Continuity provider, please refer to [email or web address] After hours, contact General Neurology

## 2016-06-22 NOTE — Progress Notes (Signed)
[facility] PROGRESS NOTE  David [name] [id number] DOB: 12/02/1955 DOA: 06/21/2016   PCP: Wyatt Haste, MD     Assessment/Plan: Principal Problem:   Aphasia Active Problems:   PAF (paroxysmal atrial fibrillation) (HCC)   OSA treated with BiPAP   HTN (hypertension)   Morbid obesity (HCC)   Tobacco abuse   Aortic stenosis   Stroke-like symptoms   60 y.o. male with medical history significant for lytic stenosis,hx of  A. Fib followed by Dr Debara Pickett , morbid obesity, hypertension, hyperlipidemia tobacco user density emergency department with complaint of aphasia and right hand numbness. Initial evaluation concerning for stroke versus TIA.   Assessment/Plan   #1. Aphasia/strokelike symptoms.   Risk factors include obesity tobacco use hypertension A. fib hyperlipidemia. Evaluated by neuro who opined most likely through a small embolic stroke from A. Fib. Chart indicates no TPA due to rapidly resolving symptoms and low NIH S. Meds include 81 of aspirin   Telemetry -NSR , has hx of a fib due to sleep apnea  -Echocardiogram pending  -MRI -acute/subacute CVA  -Hemoglobin A1c 5.5  and lipid panel-LDL 128 -Healthy diet once passed swallow eval -Physical therapy consult -Aspirin 325 daily vs need to start NOAC needs to be addressed by neurology  -start statin  #2. A. fib. Rate controlled on Cardizem and beta blocker. 81 mg of aspirin. chadvasc score 3. EKG as noted above. -hold dilt and BB for nw -monitor closely and resume home meds as indicated -will need anti-coag? -await neuro recommendations  #3. Hypertension. Blood pressure 121/66 in the emergency department. Home medications include Cardizem, metoprolol, Diovan HCT. -We'll hold these medications for now allow for permissive hypertension -monitor closely -resume as indicated  #4. obstructive sleep apnea. Reportedly uses BiPAP at home    #5. Morbid obesity. BMI 45.7 -Nutritional consult  #6.  Tobacco use. -Cessation counseling offered     DVT prophylaxsis lovenox   Code Status:  Full code     Family Communication: Discussed in detail with the patient, all imaging results, lab results explained to the patient   Disposition Plan:  In 1-2 days       Consultants:  neurology  Procedures:  None   Antibiotics: Anti-infectives    None         HPI/Subjective: No gross focal neurologic deficits today  Objective: Vitals:   06/22/16 0058 06/22/16 0301 06/22/16 0526 06/22/16 1028  BP: (!) 120/57 125/60 110/61 132/77  Pulse: 64 63 65 62  Resp: 18 18 18 18   Temp: 98.1 F (36.7 C) 98.3 F (36.8 C) 98.2 F (36.8 C) 98.2 F (36.8 C)  TempSrc: Oral Oral Oral Oral  SpO2: 97% 100% 100% 95%  Weight:      Height:        Intake/Output Summary (Last 24 hours) at 06/22/16 1101 Last data filed at 06/22/16 0409  Gross per 24 hour  Intake           365.83 ml  Output             2050 ml  Net         -1684.17 ml    Exam:  Examination:  General exam: Appears calm and comfortable  Respiratory system: Clear to auscultation. Respiratory effort normal. Cardiovascular system: S1 & S2 heard, RRR. No JVD, murmurs, rubs, gallops or clicks. No pedal edema. Gastrointestinal system: Abdomen is nondistended, soft and nontender. No organomegaly or masses felt. Normal bowel sounds heard. Central  nervous system: Alert and oriented. No focal neurological deficits. Extremities: Symmetric 5 x 5 power. Skin: No rashes, lesions or ulcers Psychiatry: Judgement and insight appear normal. Mood & affect appropriate.     Data Reviewed: I have personally reviewed following labs and imaging studies  Micro Results No results found for this or any previous visit (from the past 240 hour(s)).  Radiology Reports Ct Angio Head W Or Wo Contrast  Result Date: 06/21/2016 CLINICAL DATA:  Code stroke, right-sided weakness and expressive aphasia EXAM: CT ANGIOGRAPHY HEAD AND NECK  TECHNIQUE: Multidetector CT imaging of the head and neck was performed using the standard protocol during bolus administration of intravenous contrast. Multiplanar CT image reconstructions and MIPs were obtained to evaluate the vascular anatomy. Carotid stenosis measurements (when applicable) are obtained utilizing NASCET criteria, using the distal internal carotid diameter as the denominator. CONTRAST:  50 mL Isovue 370 IV COMPARISON:  CT head 06/21/2016 FINDINGS: CTA NECK FINDINGS Aortic arch: Mild atherosclerotic calcification in the aortic arch. Proximal great vessels widely patent. Right carotid system: Right carotid artery widely patent. Tiny atherosclerotic calcification in the carotid bulb without significant stenosis. Left carotid system: Left carotid artery widely patent without stenosis. Small calcified plaque at the bifurcation. Vertebral arteries: Right vertebral artery dominant and widely patent. Small left vertebral artery appears congenitally hypoplastic and is patent to the basilar. Skeleton: Mild degenerative change in the cervical spine. No acute skeletal abnormality. Other neck: Negative Upper chest: Apical emphysema and scarring right greater than left. Review of the MIP images confirms the above findings CTA HEAD FINDINGS Anterior circulation: Cavernous carotid widely patent without significant stenosis. Minimal atherosclerotic calcification. Right anterior middle cerebral arteries patent. No large vessel occlusion. No filling defect. Left anterior cerebral artery patent. Left M1 widely patent. Note is made of hypoperfusion of the left temporoparietal lobe on CT perfusion today. Possible small branch occlusion in this area which is difficult to identify and would be in the M3 area. This is best seen on axial thick slab MIP images. Posterior circulation: PICA patent bilaterally. Basilar widely patent. Anterior and middle cerebral arteries patent bilaterally without significant stenosis. Fetal  origin of the posterior cerebral artery bilaterally. Venous sinuses: Patent Anatomic variants: None Delayed phase: Not performed Review of the MIP images confirms the above findings IMPRESSION: No significant carotid or vertebral artery stenosis in the neck. Negative for emergent large vessel occlusion. Hypoperfusion left temporoparietal lobe based on perfusion imaging. There is possible branch occlusion M3 branch in this area. These results were called by telephone at the time of interpretation on 06/21/2016 at 12:01 pm to Dr. Donnajean Lopes, who verbally acknowledged these results. Electronically Signed   By: Franchot Gallo M.D.   On: 06/21/2016 12:03   Ct Angio Neck W And/or Wo Contrast  Result Date: 06/21/2016 CLINICAL DATA:  Code stroke, right-sided weakness and expressive aphasia EXAM: CT ANGIOGRAPHY HEAD AND NECK TECHNIQUE: Multidetector CT imaging of the head and neck was performed using the standard protocol during bolus administration of intravenous contrast. Multiplanar CT image reconstructions and MIPs were obtained to evaluate the vascular anatomy. Carotid stenosis measurements (when applicable) are obtained utilizing NASCET criteria, using the distal internal carotid diameter as the denominator. CONTRAST:  50 mL Isovue 370 IV COMPARISON:  CT head 06/21/2016 FINDINGS: CTA NECK FINDINGS Aortic arch: Mild atherosclerotic calcification in the aortic arch. Proximal great vessels widely patent. Right carotid system: Right carotid artery widely patent. Tiny atherosclerotic calcification in the carotid bulb without significant stenosis. Left carotid system: Left  carotid artery widely patent without stenosis. Small calcified plaque at the bifurcation. Vertebral arteries: Right vertebral artery dominant and widely patent. Small left vertebral artery appears congenitally hypoplastic and is patent to the basilar. Skeleton: Mild degenerative change in the cervical spine. No acute skeletal abnormality. Other neck:  Negative Upper chest: Apical emphysema and scarring right greater than left. Review of the MIP images confirms the above findings CTA HEAD FINDINGS Anterior circulation: Cavernous carotid widely patent without significant stenosis. Minimal atherosclerotic calcification. Right anterior middle cerebral arteries patent. No large vessel occlusion. No filling defect. Left anterior cerebral artery patent. Left M1 widely patent. Note is made of hypoperfusion of the left temporoparietal lobe on CT perfusion today. Possible small branch occlusion in this area which is difficult to identify and would be in the M3 area. This is best seen on axial thick slab MIP images. Posterior circulation: PICA patent bilaterally. Basilar widely patent. Anterior and middle cerebral arteries patent bilaterally without significant stenosis. Fetal origin of the posterior cerebral artery bilaterally. Venous sinuses: Patent Anatomic variants: None Delayed phase: Not performed Review of the MIP images confirms the above findings IMPRESSION: No significant carotid or vertebral artery stenosis in the neck. Negative for emergent large vessel occlusion. Hypoperfusion left temporoparietal lobe based on perfusion imaging. There is possible branch occlusion M3 branch in this area. These results were called by telephone at the time of interpretation on 06/21/2016 at 12:01 pm to Dr. Donnajean Lopes, who verbally acknowledged these results. Electronically Signed   By: Franchot Gallo M.D.   On: 06/21/2016 12:03   Mr Brain Wo Contrast  Result Date: 06/22/2016 CLINICAL DATA:  60 y/o  M; TIA. EXAM: MRI HEAD WITHOUT CONTRAST TECHNIQUE: Multiplanar, multiecho pulse sequences of the brain and surrounding structures were obtained without intravenous contrast. COMPARISON:  06/21/2016 CT head FINDINGS: Brain: Small foci of cortical diffusion restriction within the left posterior insular and additional punctate foci in left posterior temporal lobe periventricular white  matter compatible with areas of acute/early subacute infarction. Foci of diffusion restriction are associated mild T2 FLAIR hyperintensity. Background of scattered nonspecific foci of T2 FLAIR hyperintensity in subcortical and periventricular white matter compatible with mild chronic microvascular ischemic changes. Mild parenchymal volume loss. No abnormal susceptibility hypointensity to indicate intracranial hemorrhage. Vascular: Normal flow voids. Skull and upper cervical spine: Normal marrow signal. Sinuses/Orbits: Left frontal, anterior ethmoid, and maxillary sinus opacification with a middle meatus obstructive pattern. Mild mucosal thickening of paranasal sinuses elsewhere. No abnormal signal of mastoid air cells. Orbits are unremarkable. Other: None. IMPRESSION: 1. Small foci of cortical diffusion restriction within the left posterior insular and additional punctate foci in left posterior temporal lobe periventricular white matter compatible with areas of acute/early subacute infarction. No hemorrhage identified. 2. Mild chronic microvascular ischemic changes and parenchymal volume loss of the brain. 3. Left frontal, anterior ethmoid, and maxillary sinus opacification with a middle meatus obstructive pattern, direct visualization is recommended. Electronically Signed   By: Kristine Garbe M.D.   On: 06/22/2016 01:03   Ct Cerebral Perfusion W Contrast  Result Date: 06/21/2016 CLINICAL DATA:  Code stroke. Right-sided weakness. Expressive aphasia. EXAM: CT CEREBRAL PERFUSION WITH CONTRAST TECHNIQUE: CT perfusion was performed using RAPID software CONTRAST:  40 mL Isovue 370 IV COMPARISON:  CT head and CTA head 06/21/2013 FINDINGS: Hypoperfusion left posterior temporal parietal lobe. T-max greater than 6 second volume is 34 mm. No evidence of core infarct. Cerebral blood flow less than 30% volume is 0 mL. IMPRESSION: Hypoperfusion left temporal parietal  lobe without core infarct. T-max greater than 6  second volume 34 mL. These results were called by telephone at the time of interpretation on 06/21/2016 at 12:02 pm to Dr. Cristobal Goldmann, who verbally acknowledged these results. Electronically Signed   By: Franchot Gallo M.D.   On: 06/21/2016 12:06   Dg Chest Port 1 View  Result Date: 06/21/2016 CLINICAL DATA:  Syncope. EXAM: PORTABLE CHEST 1 VIEW COMPARISON:  06/16/2015 FINDINGS: Chronic mild cardiomegaly. Mediastinal shadows are normal. Chronic interstitial lung markings appear the same without evidence of active infiltrate, collapse or effusion. No acute bone finding. IMPRESSION: No active disease.  Chronic interstitial lung markings. Electronically Signed   By: Nelson Chimes M.D.   On: 06/21/2016 15:14   Ct Head Code Stroke Wo Contrast`  Result Date: 06/21/2016 CLINICAL DATA:  Code stroke. Right sided numbness. Expressive aphasia. EXAM: CT HEAD WITHOUT CONTRAST TECHNIQUE: Contiguous axial images were obtained from the base of the skull through the vertex without intravenous contrast. COMPARISON:  07/06/2011 FINDINGS: Brain: Normal appearance without evidence of old or acute infarction, mass lesion, hemorrhage, hydrocephalus or extra-axial collection. Vascular: No hyperdense vessels. Skull: Negative Sinuses/Orbits: Extensive sinus opacification on the left consistent with carina sinusitis. Other: None significant ASPECTS ([place] Stroke Program Early CT Score) - Ganglionic level infarction (caudate, lentiform nuclei, internal capsule, insula, M1-M3 cortex): 7 - Supraganglionic infarction (M4-M6 cortex): 3 Total score (0-10 with 10 being normal): 10 IMPRESSION: 1. Normal head CT with the exception of extensive paranasal sinusitis on the left. 2. ASPECTS is 10 These results were called by telephone at the time of interpretation on 06/21/2016 at 11:23 am to Dr. Cristobal Goldmann , who verbally acknowledged these results. Electronically Signed   By: Nelson Chimes M.D.   On: 06/21/2016 11:26     CBC  Recent Labs Lab  06/21/16 1103 06/21/16 1115  WBC 8.0  --   HGB 15.2 15.3  HCT 45.2 45.0  PLT 230  --   MCV 92.1  --   MCH 31.0  --   MCHC 33.6  --   RDW 14.2  --   LYMPHSABS 2.2  --   MONOABS 0.8  --   EOSABS 0.3  --   BASOSABS 0.1  --     Chemistries   Recent Labs Lab 06/21/16 1103 06/21/16 1115  NA 137 139  K 3.6 3.6  CL 106 104  CO2 23  --   GLUCOSE 111* 112*  BUN 17 20  CREATININE 0.99 1.00  CALCIUM 8.9  --   AST 22  --   ALT 23  --   ALKPHOS 72  --   BILITOT 0.6  --    ------------------------------------------------------------------------------------------------------------------ estimated creatinine clearance is 135 mL/min (by C-G formula based on SCr of 1 mg/dL). ------------------------------------------------------------------------------------------------------------------  Recent Labs  06/21/16 1103  HGBA1C 5.5   ------------------------------------------------------------------------------------------------------------------  Recent Labs  06/22/16 0318  CHOL 176  HDL 26*  LDLCALC 128*  TRIG 110  CHOLHDL 6.8   ------------------------------------------------------------------------------------------------------------------ No results for input(s): TSH, T4TOTAL, T3FREE, THYROIDAB in the last 72 hours.  Invalid input(s): FREET3 ------------------------------------------------------------------------------------------------------------------ No results for input(s): VITAMINB12, FOLATE, FERRITIN, TIBC, IRON, RETICCTPCT in the last 72 hours.  Coagulation profile  Recent Labs Lab 06/21/16 1103  INR 1.01    No results for input(s): DDIMER in the last 72 hours.  Cardiac Enzymes No results for input(s): CKMB, TROPONINI, MYOGLOBIN in the last 168 hours.  Invalid input(s): CK ------------------------------------------------------------------------------------------------------------------ Invalid input(s): POCBNP   CBG:  Recent Labs Lab  06/21/16 1110  GLUCAP 101*       Studies: Ct Angio Head W Or Wo Contrast  Result Date: 06/21/2016 CLINICAL DATA:  Code stroke, right-sided weakness and expressive aphasia EXAM: CT ANGIOGRAPHY HEAD AND NECK TECHNIQUE: Multidetector CT imaging of the head and neck was performed using the standard protocol during bolus administration of intravenous contrast. Multiplanar CT image reconstructions and MIPs were obtained to evaluate the vascular anatomy. Carotid stenosis measurements (when applicable) are obtained utilizing NASCET criteria, using the distal internal carotid diameter as the denominator. CONTRAST:  50 mL Isovue 370 IV COMPARISON:  CT head 06/21/2016 FINDINGS: CTA NECK FINDINGS Aortic arch: Mild atherosclerotic calcification in the aortic arch. Proximal great vessels widely patent. Right carotid system: Right carotid artery widely patent. Tiny atherosclerotic calcification in the carotid bulb without significant stenosis. Left carotid system: Left carotid artery widely patent without stenosis. Small calcified plaque at the bifurcation. Vertebral arteries: Right vertebral artery dominant and widely patent. Small left vertebral artery appears congenitally hypoplastic and is patent to the basilar. Skeleton: Mild degenerative change in the cervical spine. No acute skeletal abnormality. Other neck: Negative Upper chest: Apical emphysema and scarring right greater than left. Review of the MIP images confirms the above findings CTA HEAD FINDINGS Anterior circulation: Cavernous carotid widely patent without significant stenosis. Minimal atherosclerotic calcification. Right anterior middle cerebral arteries patent. No large vessel occlusion. No filling defect. Left anterior cerebral artery patent. Left M1 widely patent. Note is made of hypoperfusion of the left temporoparietal lobe on CT perfusion today. Possible small branch occlusion in this area which is difficult to identify and would be in the M3 area.  This is best seen on axial thick slab MIP images. Posterior circulation: PICA patent bilaterally. Basilar widely patent. Anterior and middle cerebral arteries patent bilaterally without significant stenosis. Fetal origin of the posterior cerebral artery bilaterally. Venous sinuses: Patent Anatomic variants: None Delayed phase: Not performed Review of the MIP images confirms the above findings IMPRESSION: No significant carotid or vertebral artery stenosis in the neck. Negative for emergent large vessel occlusion. Hypoperfusion left temporoparietal lobe based on perfusion imaging. There is possible branch occlusion M3 branch in this area. These results were called by telephone at the time of interpretation on 06/21/2016 at 12:01 pm to Dr. Donnajean Lopes, who verbally acknowledged these results. Electronically Signed   By: Franchot Gallo M.D.   On: 06/21/2016 12:03   Ct Angio Neck W And/or Wo Contrast  Result Date: 06/21/2016 CLINICAL DATA:  Code stroke, right-sided weakness and expressive aphasia EXAM: CT ANGIOGRAPHY HEAD AND NECK TECHNIQUE: Multidetector CT imaging of the head and neck was performed using the standard protocol during bolus administration of intravenous contrast. Multiplanar CT image reconstructions and MIPs were obtained to evaluate the vascular anatomy. Carotid stenosis measurements (when applicable) are obtained utilizing NASCET criteria, using the distal internal carotid diameter as the denominator. CONTRAST:  50 mL Isovue 370 IV COMPARISON:  CT head 06/21/2016 FINDINGS: CTA NECK FINDINGS Aortic arch: Mild atherosclerotic calcification in the aortic arch. Proximal great vessels widely patent. Right carotid system: Right carotid artery widely patent. Tiny atherosclerotic calcification in the carotid bulb without significant stenosis. Left carotid system: Left carotid artery widely patent without stenosis. Small calcified plaque at the bifurcation. Vertebral arteries: Right vertebral artery dominant  and widely patent. Small left vertebral artery appears congenitally hypoplastic and is patent to the basilar. Skeleton: Mild degenerative change in the cervical spine. No acute skeletal abnormality. Other neck: Negative Upper chest: Apical emphysema and  scarring right greater than left. Review of the MIP images confirms the above findings CTA HEAD FINDINGS Anterior circulation: Cavernous carotid widely patent without significant stenosis. Minimal atherosclerotic calcification. Right anterior middle cerebral arteries patent. No large vessel occlusion. No filling defect. Left anterior cerebral artery patent. Left M1 widely patent. Note is made of hypoperfusion of the left temporoparietal lobe on CT perfusion today. Possible small branch occlusion in this area which is difficult to identify and would be in the M3 area. This is best seen on axial thick slab MIP images. Posterior circulation: PICA patent bilaterally. Basilar widely patent. Anterior and middle cerebral arteries patent bilaterally without significant stenosis. Fetal origin of the posterior cerebral artery bilaterally. Venous sinuses: Patent Anatomic variants: None Delayed phase: Not performed Review of the MIP images confirms the above findings IMPRESSION: No significant carotid or vertebral artery stenosis in the neck. Negative for emergent large vessel occlusion. Hypoperfusion left temporoparietal lobe based on perfusion imaging. There is possible branch occlusion M3 branch in this area. These results were called by telephone at the time of interpretation on 06/21/2016 at 12:01 pm to Dr. Donnajean Lopes, who verbally acknowledged these results. Electronically Signed   By: Franchot Gallo M.D.   On: 06/21/2016 12:03   Mr Brain Wo Contrast  Result Date: 06/22/2016 CLINICAL DATA:  60 y/o  M; TIA. EXAM: MRI HEAD WITHOUT CONTRAST TECHNIQUE: Multiplanar, multiecho pulse sequences of the brain and surrounding structures were obtained without intravenous contrast.  COMPARISON:  06/21/2016 CT head FINDINGS: Brain: Small foci of cortical diffusion restriction within the left posterior insular and additional punctate foci in left posterior temporal lobe periventricular white matter compatible with areas of acute/early subacute infarction. Foci of diffusion restriction are associated mild T2 FLAIR hyperintensity. Background of scattered nonspecific foci of T2 FLAIR hyperintensity in subcortical and periventricular white matter compatible with mild chronic microvascular ischemic changes. Mild parenchymal volume loss. No abnormal susceptibility hypointensity to indicate intracranial hemorrhage. Vascular: Normal flow voids. Skull and upper cervical spine: Normal marrow signal. Sinuses/Orbits: Left frontal, anterior ethmoid, and maxillary sinus opacification with a middle meatus obstructive pattern. Mild mucosal thickening of paranasal sinuses elsewhere. No abnormal signal of mastoid air cells. Orbits are unremarkable. Other: None. IMPRESSION: 1. Small foci of cortical diffusion restriction within the left posterior insular and additional punctate foci in left posterior temporal lobe periventricular white matter compatible with areas of acute/early subacute infarction. No hemorrhage identified. 2. Mild chronic microvascular ischemic changes and parenchymal volume loss of the brain. 3. Left frontal, anterior ethmoid, and maxillary sinus opacification with a middle meatus obstructive pattern, direct visualization is recommended. Electronically Signed   By: Kristine Garbe M.D.   On: 06/22/2016 01:03   Ct Cerebral Perfusion W Contrast  Result Date: 06/21/2016 CLINICAL DATA:  Code stroke. Right-sided weakness. Expressive aphasia. EXAM: CT CEREBRAL PERFUSION WITH CONTRAST TECHNIQUE: CT perfusion was performed using RAPID software CONTRAST:  40 mL Isovue 370 IV COMPARISON:  CT head and CTA head 06/21/2013 FINDINGS: Hypoperfusion left posterior temporal parietal lobe. T-max  greater than 6 second volume is 34 mm. No evidence of core infarct. Cerebral blood flow less than 30% volume is 0 mL. IMPRESSION: Hypoperfusion left temporal parietal lobe without core infarct. T-max greater than 6 second volume 34 mL. These results were called by telephone at the time of interpretation on 06/21/2016 at 12:02 pm to Dr. Cristobal Goldmann, who verbally acknowledged these results. Electronically Signed   By: Franchot Gallo M.D.   On: 06/21/2016 12:06   Dg Chest  Port 1 View  Result Date: 06/21/2016 CLINICAL DATA:  Syncope. EXAM: PORTABLE CHEST 1 VIEW COMPARISON:  06/16/2015 FINDINGS: Chronic mild cardiomegaly. Mediastinal shadows are normal. Chronic interstitial lung markings appear the same without evidence of active infiltrate, collapse or effusion. No acute bone finding. IMPRESSION: No active disease.  Chronic interstitial lung markings. Electronically Signed   By: Nelson Chimes M.D.   On: 06/21/2016 15:14   Ct Head Code Stroke Wo Contrast`  Result Date: 06/21/2016 CLINICAL DATA:  Code stroke. Right sided numbness. Expressive aphasia. EXAM: CT HEAD WITHOUT CONTRAST TECHNIQUE: Contiguous axial images were obtained from the base of the skull through the vertex without intravenous contrast. COMPARISON:  07/06/2011 FINDINGS: Brain: Normal appearance without evidence of old or acute infarction, mass lesion, hemorrhage, hydrocephalus or extra-axial collection. Vascular: No hyperdense vessels. Skull: Negative Sinuses/Orbits: Extensive sinus opacification on the left consistent with carina sinusitis. Other: None significant ASPECTS ([place] Stroke Program Early CT Score) - Ganglionic level infarction (caudate, lentiform nuclei, internal capsule, insula, M1-M3 cortex): 7 - Supraganglionic infarction (M4-M6 cortex): 3 Total score (0-10 with 10 being normal): 10 IMPRESSION: 1. Normal head CT with the exception of extensive paranasal sinusitis on the left. 2. ASPECTS is 10 These results were called by telephone at  the time of interpretation on 06/21/2016 at 11:23 am to Dr. Cristobal Goldmann , who verbally acknowledged these results. Electronically Signed   By: Nelson Chimes M.D.   On: 06/21/2016 11:26      Lab Results  Component Value Date   HGBA1C 5.5 06/21/2016   HGBA1C 5.8 (H) 06/16/2015   Lab Results  Component Value Date   LDLCALC 128 (H) 06/22/2016   CREATININE 1.00 06/21/2016       Scheduled Meds: . aspirin  300 mg Rectal Daily   Or  . aspirin  325 mg Oral Daily  . atorvastatin  10 mg Oral q1800  . enoxaparin (LOVENOX) injection  40 mg Subcutaneous Q24H   Continuous Infusions:    LOS: 0 days    Time spent: >30 MINS    [facility]  [facility] Pager [phone]. If 7PM-7AM, please contact night-coverage at [email or web address], password [facility] 06/22/2016, 11:01 AM  LOS: 0 days

## 2016-06-23 ENCOUNTER — Inpatient Hospital Stay (HOSPITAL_COMMUNITY): Payer: 59

## 2016-06-23 DIAGNOSIS — I6789 Other cerebrovascular disease: Secondary | ICD-10-CM

## 2016-06-23 DIAGNOSIS — R4701 Aphasia: Secondary | ICD-10-CM

## 2016-06-23 LAB — BASIC METABOLIC PANEL
Anion gap: 7 (ref 5–15)
BUN: 12 mg/dL (ref 6–20)
CHLORIDE: 103 mmol/L (ref 101–111)
CO2: 25 mmol/L (ref 22–32)
CREATININE: 0.81 mg/dL (ref 0.61–1.24)
Calcium: 8.9 mg/dL (ref 8.9–10.3)
GFR calc Af Amer: >60 mL/min (ref >60)
GFR calc non Af Amer: >60 mL/min (ref >60)
Glucose, Bld: 87 mg/dL (ref 65–99)
Potassium: 3.7 mmol/L (ref 3.5–5.1)
SODIUM: 135 mmol/L (ref 135–145)

## 2016-06-23 LAB — ECHOCARDIOGRAM COMPLETE
Height: 76 in
WEIGHTICAEL: 5989.46 [oz_av]

## 2016-06-23 MED ORDER — ATORVASTATIN CALCIUM 10 MG PO TABS: 10.0000 mg | ORAL_TABLET | Freq: Every day | ORAL | 0 refills | Status: DC

## 2016-06-23 MED ORDER — PERFLUTREN LIPID MICROSPHERE
1.0000 mL | INTRAVENOUS | Status: AC | PRN
  Administered 2016-06-23: 3 mL via INTRAVENOUS
  Filled 2016-06-23: qty 10

## 2016-06-23 MED ORDER — RIVAROXABAN 20 MG PO TABS: 20.0000 mg | ORAL_TABLET | Freq: Every day | ORAL | Status: DC

## 2016-06-23 MED ORDER — RIVAROXABAN 20 MG PO TABS: 20.0000 mg | ORAL_TABLET | Freq: Every day | ORAL | 0 refills | Status: DC

## 2016-06-23 MED ORDER — ATORVASTATIN CALCIUM 10 MG PO TABS
10.0000 mg | ORAL_TABLET | Freq: Every day | ORAL | 0 refills | Status: DC
Start: 2016-06-23 — End: 2016-08-02

## 2016-06-23 NOTE — Progress Notes (Signed)
  Echocardiogram 2D Echocardiogram has been performed.  David [name] 06/23/2016, 2:04 PM

## 2016-06-23 NOTE — Progress Notes (Signed)
Patient ready for discharge to home; discharge instructions given by SWAT RN; Rx's given; patient will be discharged out via wheelchair.

## 2016-06-23 NOTE — Discharge Instructions (Signed)
Heart Disease Prevention Heart disease is a leading cause of death. There are many things you can do to help prevent heart disease. BE PHYSICALLY ACTIVE Physical activity is good for your heart. It helps control your blood pressure, cholesterol levels, and weight. Try to be physically active every day. Ask your health care provider what activities are best for you.  BE A HEALTHY WEIGHT Extra weight can strain your heart and affect your blood pressure and cholesterol levels. Lose weight with diet and exercise if recommended by your health care provider. EAT HEART-HEALTHY FOODS Follow a healthy eating plan as recommended by your health care provider or dietitian. Heart-healthy foods include:   High-fiber foods. These include oat bran, oatmeal, and whole-grain breads and cereals.  Fruits and vegetables. Avoid:  Alcohol.  Fried foods.  Foods high in saturated fat. These include meats, butter, whole dairy products, shortening, and coconut or palm oil.  Salty foods. These include canned food, luncheon meat, salty snacks, and fast food. KEEP YOUR CHOLESTEROL LEVELS UNDER CONTROL Cholesterol is a substance that is used for many important functions. When your cholesterol levels are high, cholesterol can stick to the insides of your blood vessels, making them narrow or clog. This can lead to chest pain (angina) and a heart attack.  Keep your cholesterol levels under control as recommended by your health care provider. Have your cholesterol checked at least once a year. Target cholesterol levels (in mg/dL) for most people are:   Total cholesterol below 200.  LDL cholesterol below 100.  HDL cholesterol above 40 in men and above 50 in women.  Triglycerides below 150. KEEP YOUR BLOOD PRESSURE UNDER CONTROL Having high blood pressure (hypertension) puts you at risk for stroke and other forms of heart disease. Keep your blood pressure under control as recommended by your health care provider. Ask  your health care provider if you need treatment to lower your blood pressure. If you are 79-41 years of age, have your blood pressure checked every 3-5 years. If you are 51 years of age or older, have your blood pressure checked every year. DO NOT USE TOBACCO PRODUCTS Tobacco smoke can damage your heart and blood vessels. Do not use any tobacco products including cigarettes, chewing tobacco, or electronic cigarettes. If you need help quitting, ask your health care provider. TAKE MEDICINES AS DIRECTED Take medicines only as directed by your health care provider. Ask your health care provider whether you should take an aspirin every day. Taking aspirin can help reduce your risk of heart disease and stroke.  FOR MORE INFORMATION  To find out more about heart disease, visit the American Heart Association's website at www.americanheart.org   This information is not intended to replace advice given to you by your health care provider. Make sure you discuss any questions you have with your health care provider.   Document Released: 03/20/2004 Document Revised: 08/27/2014 Document Reviewed: 09/30/2013 Elsevier Interactive Patient Education 2016 [employer] Rivaroxaban oral tablets What is this medicine? RIVAROXABAN (ri va ROX a ban) is an anticoagulant (blood thinner). It is used to treat blood clots in the lungs or in the veins. It is also used after knee or hip surgeries to prevent blood clots. It is also used to lower the chance of stroke in people with a medical condition called atrial fibrillation. This medicine may be used for other purposes; ask your health care provider or pharmacist if you have questions. What should I tell my health care provider before I take this  medicine? They need to know if you have any of these conditions: -bleeding disorders -bleeding in the brain -blood in your stools (black or tarry stools) or if you have blood in your vomit -history of stomach bleeding -kidney  disease -liver disease -low blood counts, like low white cell, platelet, or red cell counts -recent or planned spinal or epidural procedure -take medicines that treat or prevent blood clots -an unusual or allergic reaction to rivaroxaban, other medicines, foods, dyes, or preservatives -pregnant or trying to get pregnant -breast-feeding How should I use this medicine? Take this medicine by mouth with a glass of water. Follow the directions on the prescription label. Take your medicine at regular intervals. Do not take it more often than directed. Do not stop taking except on your doctor's advice. Stopping this medicine may increase your risk of a blood clot. Be sure to refill your prescription before you run out of medicine. If you are taking this medicine after hip or knee replacement surgery, take it with or without food. If you are taking this medicine for atrial fibrillation, take it with your evening meal. If you are taking this medicine to treat blood clots, take it with food at the same time each day. If you are unable to swallow your tablet, you may crush the tablet and mix it in applesauce. Then, immediately eat the applesauce. You should eat more food right after you eat the applesauce containing the crushed tablet. Talk to your pediatrician regarding the use of this medicine in children. Special care may be needed. Overdosage: If you think you have taken too much of this medicine contact a poison control center or emergency room at once. NOTE: This medicine is only for you. Do not share this medicine with others. What if I miss a dose? If you take your medicine once a day and miss a dose, take the missed dose as soon as you remember. If you take your medicine twice a day and miss a dose, take the missed dose immediately. In this instance, 2 tablets may be taken at the same time. The next day you should take 1 tablet twice a day as directed. What may interact with this medicine? -aspirin  and aspirin-like medicines -certain antibiotics like erythromycin, azithromycin, and clarithromycin -certain medicines for fungal infections like ketoconazole and itraconazole -certain medicines for irregular heart beat like amiodarone, quinidine, dronedarone -certain medicines for seizures like carbamazepine, phenytoin -certain medicines that treat or prevent blood clots like warfarin, enoxaparin, and dalteparin -conivaptan -diltiazem -felodipine -indinavir -lopinavir; ritonavir -NSAIDS, medicines for pain and inflammation, like ibuprofen or naproxen -ranolazine -rifampin -ritonavir -St. John's wort -verapamil This list may not describe all possible interactions. Give your health care provider a list of all the medicines, herbs, non-prescription drugs, or dietary supplements you use. Also tell them if you smoke, drink alcohol, or use illegal drugs. Some items may interact with your medicine. What should I watch for while using this medicine? Visit your doctor or health care professional for regular checks on your progress. Your condition will be monitored carefully while you are receiving this medicine. Notify your doctor or health care professional and seek emergency treatment if you develop breathing problems; changes in vision; chest pain; severe, sudden headache; pain, swelling, warmth in the leg; trouble speaking; sudden numbness or weakness of the face, arm, or leg. These can be signs that your condition has gotten worse. If you are going to have surgery, tell your doctor or health care professional that you  are taking this medicine. Tell your health care professional that you use this medicine before you have a spinal or epidural procedure. Sometimes people who take this medicine have bleeding problems around the spine when they have a spinal or epidural procedure. This bleeding is very rare. If you have a spinal or epidural procedure while on this medicine, call your health care  professional immediately if you have back pain, numbness or tingling (especially in your legs and feet), muscle weakness, paralysis, or loss of bladder or bowel control. Avoid sports and activities that might cause injury while you are using this medicine. Severe falls or injuries can cause unseen bleeding. Be careful when using sharp tools or knives. Consider using an Copy. Take special care brushing or flossing your teeth. Report any injuries, bruising, or red spots on the skin to your doctor or health care professional. What side effects may I notice from receiving this medicine? Side effects that you should report to your doctor or health care professional as soon as possible: -allergic reactions like skin rash, itching or hives, swelling of the face, lips, or tongue -back pain -redness, blistering, peeling or loosening of the skin, including inside the mouth -signs and symptoms of bleeding such as bloody or black, tarry stools; red or dark-brown urine; spitting up blood or brown material that looks like coffee grounds; red spots on the skin; unusual bruising or bleeding from the eye, gums, or nose Side effects that usually do not require medical attention (Report these to your doctor or health care professional if they continue or are bothersome.): -dizziness -muscle pain This list may not describe all possible side effects. Call your doctor for medical advice about side effects. You may report side effects to FDA at 1-800-FDA-1088. Where should I keep my medicine? Keep out of the reach of children. Store at room temperature between 15 and 30 degrees C (59 and 86 degrees F). Throw away any unused medicine after the expiration date. NOTE: This sheet is a summary. It may not cover all possible information. If you have questions about this medicine, talk to your doctor, pharmacist, or health care provider.    2016, Elsevier/Gold Standard. (2014-08-04 12:45:34)  Information on my  medicine - XARELTO (Rivaroxaban)  This medication education was reviewed with me or my healthcare representative as part of my discharge preparation.  The pharmacist that spoke with me during my hospital stay was:  Einar Grad, [facility]  Why was Xarelto prescribed for you? Xarelto was prescribed for you to reduce the risk of a blood clot forming that can cause a stroke if you have a medical condition called atrial fibrillation (a type of irregular heartbeat).  What do you need to know about xarelto ? Take your Xarelto ONCE DAILY at the same time every day with your evening meal. If you have difficulty swallowing the tablet whole, you may crush it and mix in applesauce just prior to taking your dose.  Take Xarelto exactly as prescribed by your doctor and DO NOT stop taking Xarelto without talking to the doctor who prescribed the medication.  Stopping without other stroke prevention medication to take the place of Xarelto may increase your risk of developing a clot that causes a stroke.  Refill your prescription before you run out.  After discharge, you should have regular check-up appointments with your healthcare provider that is prescribing your Xarelto.  In the future your dose may need to be changed if your kidney function or weight changes  by a significant amount.  What do you do if you miss a dose? If you are taking Xarelto ONCE DAILY and you miss a dose, take it as soon as you remember on the same day then continue your regularly scheduled once daily regimen the next day. Do not take two doses of Xarelto at the same time or on the same day.   Important Safety Information A possible side effect of Xarelto is bleeding. You should call your healthcare provider right away if you experience any of the following: ? Bleeding from an injury or your nose that does not stop. ? Unusual colored urine (red or dark brown) or unusual colored stools (red or black). ? Unusual bruising for  unknown reasons. ? A serious fall or if you hit your head (even if there is no bleeding).  Some medicines may interact with Xarelto and might increase your risk of bleeding while on Xarelto. To help avoid this, consult your healthcare provider or pharmacist prior to using any new prescription or non-prescription medications, including herbals, vitamins, non-steroidal anti-inflammatory drugs (NSAIDs) and supplements.  This website has more information on Xarelto: [email or web address]

## 2016-06-23 NOTE — Discharge Summary (Signed)
Physician Discharge Summary  David [name] [id number] DOB: [birth year] DOA: 06/21/2016  PCP: Wyatt Haste, MD  Admit date: 06/21/2016 Discharge date: 06/23/2016  Recommendations for Outpatient Follow-up:  1. Pt will need to follow up with PCP in 1-2 weeks post discharge 2. Please obtain BMP to evaluate electrolytes and kidney function 3. Please also check CBC to evaluate Hg and Hct levels 4. Please note that neurology team recommended [facility], per cardiology Xarelto recommended   Discharge Diagnoses:  Principal Problem:   Aphasia Active Problems:   PAF (paroxysmal atrial fibrillation) ([place])  Discharge Condition: Stable  Diet recommendation: Heart healthy diet discussed in details   History of present illness:  Pt is very pleasant 60 y.o. male with medical history significant for A. fib, morbid obesity, hypertension, hyperlipidemia tobacco user, presented to the emergency department with complaint of aphasia and right hand numbness. Initial evaluation concerning for stroke versus TIA and TRH asked to admit for further evaluation.   Hospital Course:   Stroke:  2 small left brain infarcts, insular and temporal lobe, infarcts felt to be embolic secondary to known atrial fibrillation  Resultant  Aphasia and R numbness resolved  MRI  2 small left brain infarcts, insular and temporal lobe  CTA H&N  No significant stenosis, possible L M3 occlusion  2D Echo  pending upon discharge   LDL 128  HgbA1c 5.5  Discussed AC with cardiology team, Dr. Marlou Porch recommended Xarelto 20 mg PO QD  Atrial Fibrillation, valvular  Neurology recommended anticoagulation, Xarelto as noted above   Hypertension, essential   Stable  Continue home medical regimen   Hyperlipidemia  LDL 128, goal < 70  Now on lipitor 10 mg daily  Morbid Obesity  Body mass index is 45.57 kg/m., recommend weight loss, diet and exercise as appropriate   Obstructive sleep apnea, on CPAP at  home  Procedures/Studies: Ct Angio Head W Or Wo Contrast  Result Date: 06/21/2016 CLINICAL DATA:  Code stroke, right-sided weakness and expressive aphasia EXAM: CT ANGIOGRAPHY HEAD AND NECK TECHNIQUE: Multidetector CT imaging of the head and neck was performed using the standard protocol during bolus administration of intravenous contrast. Multiplanar CT image reconstructions and MIPs were obtained to evaluate the vascular anatomy. Carotid stenosis measurements (when applicable) are obtained utilizing NASCET criteria, using the distal internal carotid diameter as the denominator. CONTRAST:  50 mL Isovue 370 IV COMPARISON:  CT head 06/21/2016 FINDINGS: CTA NECK FINDINGS Aortic arch: Mild atherosclerotic calcification in the aortic arch. Proximal great vessels widely patent. Right carotid system: Right carotid artery widely patent. Tiny atherosclerotic calcification in the carotid bulb without significant stenosis. Left carotid system: Left carotid artery widely patent without stenosis. Small calcified plaque at the bifurcation. Vertebral arteries: Right vertebral artery dominant and widely patent. Small left vertebral artery appears congenitally hypoplastic and is patent to the basilar. Skeleton: Mild degenerative change in the cervical spine. No acute skeletal abnormality. Other neck: Negative Upper chest: Apical emphysema and scarring right greater than left. Review of the MIP images confirms the above findings CTA HEAD FINDINGS Anterior circulation: Cavernous carotid widely patent without significant stenosis. Minimal atherosclerotic calcification. Right anterior middle cerebral arteries patent. No large vessel occlusion. No filling defect. Left anterior cerebral artery patent. Left M1 widely patent. Note is made of hypoperfusion of the left temporoparietal lobe on CT perfusion today. Possible small branch occlusion in this area which is difficult to identify and would be in the M3 area. This is best seen on  axial thick slab  MIP images. Posterior circulation: PICA patent bilaterally. Basilar widely patent. Anterior and middle cerebral arteries patent bilaterally without significant stenosis. Fetal origin of the posterior cerebral artery bilaterally. Venous sinuses: Patent Anatomic variants: None Delayed phase: Not performed Review of the MIP images confirms the above findings IMPRESSION: No significant carotid or vertebral artery stenosis in the neck. Negative for emergent large vessel occlusion. Hypoperfusion left temporoparietal lobe based on perfusion imaging. There is possible branch occlusion M3 branch in this area. These results were called by telephone at the time of interpretation on 06/21/2016 at 12:01 pm to Dr. Donnajean Lopes, who verbally acknowledged these results. Electronically Signed   By: Franchot Gallo M.D.   On: 06/21/2016 12:03   Ct Angio Neck W And/or Wo Contrast  Result Date: 06/21/2016 CLINICAL DATA:  Code stroke, right-sided weakness and expressive aphasia EXAM: CT ANGIOGRAPHY HEAD AND NECK TECHNIQUE: Multidetector CT imaging of the head and neck was performed using the standard protocol during bolus administration of intravenous contrast. Multiplanar CT image reconstructions and MIPs were obtained to evaluate the vascular anatomy. Carotid stenosis measurements (when applicable) are obtained utilizing NASCET criteria, using the distal internal carotid diameter as the denominator. CONTRAST:  50 mL Isovue 370 IV COMPARISON:  CT head 06/21/2016 FINDINGS: CTA NECK FINDINGS Aortic arch: Mild atherosclerotic calcification in the aortic arch. Proximal great vessels widely patent. Right carotid system: Right carotid artery widely patent. Tiny atherosclerotic calcification in the carotid bulb without significant stenosis. Left carotid system: Left carotid artery widely patent without stenosis. Small calcified plaque at the bifurcation. Vertebral arteries: Right vertebral artery dominant and widely patent.  Small left vertebral artery appears congenitally hypoplastic and is patent to the basilar. Skeleton: Mild degenerative change in the cervical spine. No acute skeletal abnormality. Other neck: Negative Upper chest: Apical emphysema and scarring right greater than left. Review of the MIP images confirms the above findings CTA HEAD FINDINGS Anterior circulation: Cavernous carotid widely patent without significant stenosis. Minimal atherosclerotic calcification. Right anterior middle cerebral arteries patent. No large vessel occlusion. No filling defect. Left anterior cerebral artery patent. Left M1 widely patent. Note is made of hypoperfusion of the left temporoparietal lobe on CT perfusion today. Possible small branch occlusion in this area which is difficult to identify and would be in the M3 area. This is best seen on axial thick slab MIP images. Posterior circulation: PICA patent bilaterally. Basilar widely patent. Anterior and middle cerebral arteries patent bilaterally without significant stenosis. Fetal origin of the posterior cerebral artery bilaterally. Venous sinuses: Patent Anatomic variants: None Delayed phase: Not performed Review of the MIP images confirms the above findings IMPRESSION: No significant carotid or vertebral artery stenosis in the neck. Negative for emergent large vessel occlusion. Hypoperfusion left temporoparietal lobe based on perfusion imaging. There is possible branch occlusion M3 branch in this area. These results were called by telephone at the time of interpretation on 06/21/2016 at 12:01 pm to Dr. Donnajean Lopes, who verbally acknowledged these results. Electronically Signed   By: Franchot Gallo M.D.   On: 06/21/2016 12:03   Mr Brain Wo Contrast  Result Date: 06/22/2016 CLINICAL DATA:  60 y/o  M; TIA. EXAM: MRI HEAD WITHOUT CONTRAST TECHNIQUE: Multiplanar, multiecho pulse sequences of the brain and surrounding structures were obtained without intravenous contrast. COMPARISON:   06/21/2016 CT head FINDINGS: Brain: Small foci of cortical diffusion restriction within the left posterior insular and additional punctate foci in left posterior temporal lobe periventricular white matter compatible with areas of acute/early subacute infarction. Foci of  diffusion restriction are associated mild T2 FLAIR hyperintensity. Background of scattered nonspecific foci of T2 FLAIR hyperintensity in subcortical and periventricular white matter compatible with mild chronic microvascular ischemic changes. Mild parenchymal volume loss. No abnormal susceptibility hypointensity to indicate intracranial hemorrhage. Vascular: Normal flow voids. Skull and upper cervical spine: Normal marrow signal. Sinuses/Orbits: Left frontal, anterior ethmoid, and maxillary sinus opacification with a middle meatus obstructive pattern. Mild mucosal thickening of paranasal sinuses elsewhere. No abnormal signal of mastoid air cells. Orbits are unremarkable. Other: None. IMPRESSION: 1. Small foci of cortical diffusion restriction within the left posterior insular and additional punctate foci in left posterior temporal lobe periventricular white matter compatible with areas of acute/early subacute infarction. No hemorrhage identified. 2. Mild chronic microvascular ischemic changes and parenchymal volume loss of the brain. 3. Left frontal, anterior ethmoid, and maxillary sinus opacification with a middle meatus obstructive pattern, direct visualization is recommended. Electronically Signed   By: Kristine Garbe M.D.   On: 06/22/2016 01:03   Ct Cerebral Perfusion W Contrast  Result Date: 06/21/2016 CLINICAL DATA:  Code stroke. Right-sided weakness. Expressive aphasia. EXAM: CT CEREBRAL PERFUSION WITH CONTRAST TECHNIQUE: CT perfusion was performed using RAPID software CONTRAST:  40 mL Isovue 370 IV COMPARISON:  CT head and CTA head 06/21/2013 FINDINGS: Hypoperfusion left posterior temporal parietal lobe. T-max greater than 6  second volume is 34 mm. No evidence of core infarct. Cerebral blood flow less than 30% volume is 0 mL. IMPRESSION: Hypoperfusion left temporal parietal lobe without core infarct. T-max greater than 6 second volume 34 mL. These results were called by telephone at the time of interpretation on 06/21/2016 at 12:02 pm to Dr. Cristobal Goldmann, who verbally acknowledged these results. Electronically Signed   By: Franchot Gallo M.D.   On: 06/21/2016 12:06   Dg Chest Port 1 View  Result Date: 06/21/2016 CLINICAL DATA:  Syncope. EXAM: PORTABLE CHEST 1 VIEW COMPARISON:  06/16/2015 FINDINGS: Chronic mild cardiomegaly. Mediastinal shadows are normal. Chronic interstitial lung markings appear the same without evidence of active infiltrate, collapse or effusion. No acute bone finding. IMPRESSION: No active disease.  Chronic interstitial lung markings. Electronically Signed   By: Nelson Chimes M.D.   On: 06/21/2016 15:14   Ct Head Code Stroke Wo Contrast`  Result Date: 06/21/2016 CLINICAL DATA:  Code stroke. Right sided numbness. Expressive aphasia. EXAM: CT HEAD WITHOUT CONTRAST TECHNIQUE: Contiguous axial images were obtained from the base of the skull through the vertex without intravenous contrast. COMPARISON:  07/06/2011 FINDINGS: Brain: Normal appearance without evidence of old or acute infarction, mass lesion, hemorrhage, hydrocephalus or extra-axial collection. Vascular: No hyperdense vessels. Skull: Negative Sinuses/Orbits: Extensive sinus opacification on the left consistent with carina sinusitis. Other: None significant ASPECTS ([place] Stroke Program Early CT Score) - Ganglionic level infarction (caudate, lentiform nuclei, internal capsule, insula, M1-M3 cortex): 7 - Supraganglionic infarction (M4-M6 cortex): 3 Total score (0-10 with 10 being normal): 10 IMPRESSION: 1. Normal head CT with the exception of extensive paranasal sinusitis on the left. 2. ASPECTS is 10 These results were called by telephone at the time of  interpretation on 06/21/2016 at 11:23 am to Dr. Cristobal Goldmann , who verbally acknowledged these results. Electronically Signed   By: Nelson Chimes M.D.   On: 06/21/2016 11:26    Discharge Exam: Vitals:   06/23/16 0912 06/23/16 1255  BP: (!) 123/59 128/73  Pulse: 65 64  Resp: 20 19  Temp: 98.6 F (37 C) 98 F (36.7 C)   Vitals:   06/23/16 0223 06/23/16  0549 06/23/16 0912 06/23/16 1255  BP: (!) 120/55 (!) 106/53 (!) 123/59 128/73  Pulse: (!) 59 (!) 56 65 64  Resp: 18 18 20 19   Temp: 97.5 F (36.4 C) 97.9 F (36.6 C) 98.6 F (37 C) 98 F (36.7 C)  TempSrc: Axillary Axillary Oral Oral  SpO2: 94% 94% 97% 98%  Weight:      Height:        General: Pt is alert, follows commands appropriately, not in acute distress Cardiovascular: Irregular rate and rhythm, no rubs, no gallops Respiratory: Clear to auscultation bilaterally, no wheezing, no crackles, no rhonchi Abdominal: Soft, non tender, non distended, bowel sounds +, no guarding  Discharge Instructions  Discharge Instructions    Diet - low sodium heart healthy    Complete by:  As directed    Increase activity slowly    Complete by:  As directed        Medication List    STOP taking these medications   aspirin EC 81 MG tablet     TAKE these medications   ALPRAZolam 1 MG tablet Commonly known as:  XANAX Take 1 tablet (1 mg total) by mouth at bedtime as needed. sleep   atorvastatin 10 MG tablet Commonly known as:  LIPITOR Take 1 tablet (10 mg total) by mouth daily at 6 PM.   diltiazem 120 MG 24 hr capsule Commonly known as:  CARDIZEM CD Take 1 capsule (120 mg total) by mouth daily.   methocarbamol 500 MG tablet Commonly known as:  ROBAXIN Take 1 tablet (500 mg total) by mouth 4 (four) times daily. What changed:  when to take this  reasons to take this   metoprolol succinate 50 MG 24 hr tablet Commonly known as:  TOPROL-XL Take 1 tablet (50 mg total) by mouth daily. May take extra 1/2 tablet (25mg ) as needed.    rivaroxaban 20 MG Tabs tablet Commonly known as:  XARELTO Take 1 tablet (20 mg total) by mouth daily with supper.   valsartan-hydrochlorothiazide 160-25 MG tablet Commonly known as:  DIOVAN-HCT Take 1 tablet by mouth daily.       Follow-up Information    Wyatt Haste, MD .   Specialty:  Family Medicine Contact information: [place] [address] [phone]        Faye Ramsay, MD .   Specialty:  Internal Medicine Contact information: [address] [place] [address] [phone]        [name], MD .   Specialty:  Cardiology Contact information: [employer] [place] [address] [phone]            The results of significant diagnostics from this hospitalization (including imaging, microbiology, ancillary and laboratory) are listed below for reference.     Microbiology: No results found for this or any previous visit (from the past 240 hour(s)).   Labs: Basic Metabolic Panel:  Recent Labs Lab 06/21/16 1103 06/21/16 1115 06/23/16 0330  NA 137 139 135  K 3.6 3.6 3.7  CL 106 104 103  CO2 23  --  25  GLUCOSE 111* 112* 87  BUN 17 20 12   CREATININE 0.99 1.00 0.81  CALCIUM 8.9  --  8.9   Liver Function Tests:  Recent Labs Lab 06/21/16 1103  AST 22  ALT 23  ALKPHOS 72  BILITOT 0.6  PROT 6.6  ALBUMIN 3.4*   CBC:  Recent Labs Lab 06/21/16 1103 06/21/16 1115  WBC 8.0  --  NEUTROABS 4.7  --   HGB 15.2 15.3  HCT 45.2 45.0  MCV 92.1  --   PLT 230  --    CBG:  Recent Labs Lab 06/21/16 1110  GLUCAP 101*    SIGNED: Time coordinating discharge: 30 minutes  Faye Ramsay, MD  [facility] 06/23/2016, 12:58 PM Pager [phone]  If 7PM-7AM, please contact night-coverage [email or web address] Password [email or web address]

## 2016-06-25 ENCOUNTER — Telehealth: Payer: Self-pay | Admitting: Family Medicine

## 2016-06-25 NOTE — Telephone Encounter (Signed)
Call pt concerning recent hospital stay. Pt states he is doing ok and is trying really hard to quite smoking. Pt states that the hospital did put him on new meds but he hasnt gotten filled yet. He states he is unsure if his ins will pay for it. Pt was informed to call pharmacy and they could help him out with that. Pt made a TOC vist and was told to bring all meds with him. Pt stated he would.

## 2016-06-28 ENCOUNTER — Ambulatory Visit (INDEPENDENT_AMBULATORY_CARE_PROVIDER_SITE_OTHER): Payer: 59 | Admitting: Family Medicine

## 2016-06-28 ENCOUNTER — Encounter: Payer: Self-pay | Admitting: Family Medicine

## 2016-06-28 VITALS — BP 108/60 | HR 64 | Wt 353.0 lb

## 2016-06-28 DIAGNOSIS — I480 Paroxysmal atrial fibrillation: Secondary | ICD-10-CM | POA: Diagnosis not present

## 2016-06-28 DIAGNOSIS — Z87891 Personal history of nicotine dependence: Secondary | ICD-10-CM | POA: Diagnosis not present

## 2016-06-28 DIAGNOSIS — I350 Nonrheumatic aortic (valve) stenosis: Secondary | ICD-10-CM | POA: Diagnosis not present

## 2016-06-28 DIAGNOSIS — I10 Essential (primary) hypertension: Secondary | ICD-10-CM

## 2016-06-28 DIAGNOSIS — I63412 Cerebral infarction due to embolism of left middle cerebral artery: Secondary | ICD-10-CM

## 2016-06-28 DIAGNOSIS — G4733 Obstructive sleep apnea (adult) (pediatric): Secondary | ICD-10-CM

## 2016-06-28 LAB — CBC WITH DIFFERENTIAL/PLATELET
BASOS ABS: 90 {cells}/uL (ref 0–200)
Basophils Relative: 1 %
EOS ABS: 270 {cells}/uL (ref 15–500)
EOS PCT: 3 %
HCT: 46.2 % (ref 38.5–50.0)
Hemoglobin: 15.4 g/dL (ref 13.2–17.1)
LYMPHS PCT: 24 %
Lymphs Abs: 2160 cells/uL (ref 850–3900)
MCH: 31 pg (ref 27.0–33.0)
MCHC: 33.3 g/dL (ref 32.0–36.0)
MCV: 93.1 fL (ref 80.0–100.0)
MONOS PCT: 10 %
MPV: 10 fL (ref 7.5–12.5)
Monocytes Absolute: 900 cells/uL (ref 200–950)
NEUTROS ABS: 5580 {cells}/uL (ref 1500–7800)
NEUTROS PCT: 62 %
PLATELETS: 249 10*3/uL (ref 140–400)
RBC: 4.96 MIL/uL (ref 4.20–5.80)
RDW: 14 % (ref 11.0–15.0)
WBC: 9 10*3/uL (ref 4.0–10.5)

## 2016-06-28 NOTE — Progress Notes (Addendum)
   Subjective:    Patient ID: David [name], male    DOB: [birth year], 60 y.o.   MRN: [id number]  HPI He is here for posthospitalization follow-up. He was discharged on November 4 after being seen and treated for CVA secondary to PAF causing an embolic stroke. He did have resolution of his symptoms very quickly. MR angiography did show 2 areas of infarct. Does have a previous history of PAF that in the past caused very little difficulty. He was probably initially caused by OSA and he is now on CPAP and doing quite nicely on that. He also has an underlying history of aortic valvular stenosis. He has a history of smoking and states that he quit on November 1. This particular event had a quite profound effect on him and his view of his health. Does have underlying arthritis of the knee which does interfere with his physical activities.   Review of Systems     Objective:   Physical Exam [place] and in no distress. Cardiac exam shows a sinus rhythm with a 2/6 murmur. His speech pattern is normal. Moves all extremities. He states that he quit as mentioned above on November 1. His medications were reviewed.       Assessment & Plan:  PAF (paroxysmal atrial fibrillation) ([facility]) - Plan: CBC with Differential/Platelet, Comprehensive metabolic panel  Nonrheumatic aortic valve stenosis  OSA treated with BiPAP  Essential hypertension - Plan: CBC with Differential/Platelet, Comprehensive metabolic panel  Morbid obesity ([place]) - Plan: Amb ref to Medical Nutrition Therapy-MNT  Former smoker  Cerebrovascular accident (CVA) due to embolism of left middle cerebral artery ([place]) I had a long discussion with him concerning his overall health in regard to diet, exercise, medications, smoking, habits that he has gotten into in regard to smoking and eating. He admits that both smoking and eating are more behavior patterns related to stress reduction. He now realizes the need to make major lifestyle changes in regard to  diet, exercise and continued to remain smoke free. Discussed the need for him to make dietary changes to have more frequent but smaller meals and will refer to nutritionist for this. Also discussed his physical activity level especially in regard to his knee pain. Recommended small intervals of exercise and as his weight reduces, we can increase this. Discussed stress and stress management in regard to smoking as well as his eating habits. Over 45 minutes, the majority of this spent in counseling and coordination of care. He will return here in one month. Over

## 2016-06-29 ENCOUNTER — Telehealth: Payer: Self-pay | Admitting: Family Medicine

## 2016-06-29 LAB — COMPREHENSIVE METABOLIC PANEL
ALT: 21 U/L (ref 9–46)
AST: 16 U/L (ref 10–35)
Albumin: 4 g/dL (ref 3.6–5.1)
Alkaline Phosphatase: 78 U/L (ref 40–115)
BUN: 17 mg/dL (ref 7–25)
CHLORIDE: 102 mmol/L (ref 98–110)
CO2: 17 mmol/L — AB (ref 20–31)
CREATININE: 0.97 mg/dL (ref 0.70–1.33)
Calcium: 9.4 mg/dL (ref 8.6–10.3)
Glucose, Bld: 88 mg/dL (ref 65–99)
POTASSIUM: 4.4 mmol/L (ref 3.5–5.3)
SODIUM: 135 mmol/L (ref 135–146)
Total Bilirubin: 0.7 mg/dL (ref 0.2–1.2)
Total Protein: 7.3 g/dL (ref 6.1–8.1)

## 2016-06-29 NOTE — Telephone Encounter (Signed)
Called pt and left message to call back and make a 1 month f/u appointment per Dr Redmond School

## 2016-08-02 ENCOUNTER — Other Ambulatory Visit: Payer: Self-pay

## 2016-08-02 ENCOUNTER — Telehealth: Payer: Self-pay | Admitting: Family Medicine

## 2016-08-02 DIAGNOSIS — F419 Anxiety disorder, unspecified: Secondary | ICD-10-CM

## 2016-08-02 MED ORDER — RIVAROXABAN 20 MG PO TABS: 20.0000 mg | ORAL_TABLET | Freq: Every day | ORAL | 5 refills | Status: DC

## 2016-08-02 MED ORDER — ATORVASTATIN CALCIUM 10 MG PO TABS: 10.0000 mg | ORAL_TABLET | Freq: Every day | ORAL | 5 refills | Status: DC

## 2016-08-02 NOTE — Telephone Encounter (Signed)
Pt called and states he was put on these two meds in the hospital and now needs refills to [facility] on [address].  Atorvastatin and Xarelto

## 2016-08-02 NOTE — Telephone Encounter (Signed)
Sent in

## 2016-09-23 ENCOUNTER — Other Ambulatory Visit: Payer: Self-pay | Admitting: Internal Medicine

## 2016-09-25 NOTE — Telephone Encounter (Signed)
Rx(s) sent to pharmacy electronically.  

## 2016-10-18 ENCOUNTER — Other Ambulatory Visit: Payer: Self-pay | Admitting: Internal Medicine

## 2016-10-19 NOTE — Telephone Encounter (Signed)
Rx(s) sent to pharmacy electronically.  

## 2016-11-13 ENCOUNTER — Other Ambulatory Visit: Payer: Self-pay | Admitting: Internal Medicine

## 2016-11-13 DIAGNOSIS — F419 Anxiety disorder, unspecified: Secondary | ICD-10-CM

## 2016-11-13 MED ORDER — METOPROLOL SUCCINATE ER 50 MG PO TB24: 50.0000 mg | ORAL_TABLET | Freq: Every day | ORAL | 1 refills | Status: DC

## 2016-11-13 MED ORDER — DILTIAZEM HCL ER COATED BEADS 120 MG PO CP24: 120.0000 mg | ORAL_CAPSULE | Freq: Every day | ORAL | 1 refills | Status: DC

## 2016-11-13 NOTE — Telephone Encounter (Signed)
Rx(s) for Metoprolol Succinate and Diltiazem were sent to patient's preferred pharmacy electronically. Routed to MD for approval to refill Alprazolam.

## 2016-11-13 NOTE — Telephone Encounter (Signed)
New message    *STAT* If patient is at the pharmacy, call can be transferred to refill team.   1. Which medications need to be refilled? (please list name of each medication and dose if known) metoprolol succinate (TOPROL-XL) 50 MG 24 hr tablet, diltiazem (CARTIA XT) 120 MG 24 hr capsule, ALPRAZolam (XANAX) 1 MG tablet  2. Which pharmacy/location (including street and city if local pharmacy) is medication to be sent to? [facility] [address]  3. Do they need a 30 day or 90 day supply? 30 day   Pt has appt on May 1st at [facility] with Dr. Debara Pickett

## 2016-11-16 ENCOUNTER — Other Ambulatory Visit: Payer: Self-pay | Admitting: Internal Medicine

## 2016-12-18 ENCOUNTER — Ambulatory Visit (INDEPENDENT_AMBULATORY_CARE_PROVIDER_SITE_OTHER): Payer: 59 | Admitting: Internal Medicine

## 2016-12-18 ENCOUNTER — Encounter: Payer: Self-pay | Admitting: Internal Medicine

## 2016-12-18 DIAGNOSIS — I480 Paroxysmal atrial fibrillation: Secondary | ICD-10-CM

## 2016-12-18 DIAGNOSIS — G4733 Obstructive sleep apnea (adult) (pediatric): Secondary | ICD-10-CM | POA: Diagnosis not present

## 2016-12-18 DIAGNOSIS — I63412 Cerebral infarction due to embolism of left middle cerebral artery: Secondary | ICD-10-CM | POA: Diagnosis not present
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
## 2016-12-18 NOTE — Patient Instructions (Signed)

## 2016-12-20 NOTE — Progress Notes (Signed)
Schedule him an appointment for follow-up

## 2016-12-21 ENCOUNTER — Other Ambulatory Visit: Payer: Self-pay | Admitting: Internal Medicine

## 2016-12-21 ENCOUNTER — Telehealth: Payer: Self-pay

## 2016-12-21 NOTE — Telephone Encounter (Signed)
I informed pt that Dr.lalonde wants him to have a follow up appointment he said he would call right back

## 2017-01-16 ENCOUNTER — Other Ambulatory Visit: Payer: Self-pay | Admitting: Internal Medicine

## 2017-01-16 NOTE — Telephone Encounter (Signed)
Rx has been sent to the pharmacy electronically. ° °

## 2017-01-24 ENCOUNTER — Other Ambulatory Visit: Payer: Self-pay | Admitting: Family Medicine

## 2017-01-24 NOTE — Telephone Encounter (Signed)
Is this okay to refill? 

## 2017-03-18 ENCOUNTER — Telehealth: Payer: Self-pay

## 2017-03-18 ENCOUNTER — Encounter: Payer: Self-pay | Admitting: Family Medicine

## 2017-03-18 ENCOUNTER — Ambulatory Visit (INDEPENDENT_AMBULATORY_CARE_PROVIDER_SITE_OTHER): Payer: 59 | Admitting: Family Medicine

## 2017-03-18 VITALS — BP 124/74 | HR 80 | Wt 382.0 lb

## 2017-03-18 DIAGNOSIS — L03211 Cellulitis of face: Secondary | ICD-10-CM | POA: Diagnosis not present

## 2017-03-18 MED ORDER — AMOXICILLIN-POT CLAVULANATE 875-125 MG PO TABS: 1.0000 | ORAL_TABLET | Freq: Two times a day (BID) | ORAL | 0 refills | Status: DC

## 2017-03-18 NOTE — Telephone Encounter (Signed)
Pt questions if there is anything topical he can put on his face. Please call back [phone]. Victorino December

## 2017-03-18 NOTE — Telephone Encounter (Signed)
Pt informed and verbalized understanding

## 2017-03-18 NOTE — Telephone Encounter (Signed)
Systemic is definitely the best way to go

## 2017-03-18 NOTE — Progress Notes (Signed)
   Subjective:    Patient ID: David [name], male    DOB: 1956/01/16, 61 y.o.   MRN: [id number]  HPI He complains of pain, swelling and drainage as well as ear pain and drainage from both eyes. This is been going on for over a week. He states that it is slightly better today.   Review of Systems     Objective:   Physical Exam Diffuse swelling with erythema that is patchy in nature on his face and both ears. Canals also social swelling. TMs were difficult to see. Conjunctivae are not injected although the eyelids are swollen. He does have some yellowish crusty tried drainage to the right side of the nose.       Assessment & Plan:  Cellulitis of face - Plan: amoxicillin-clavulanate (AUGMENTIN) 875-125 MG tablet I explained to him that I'm quite concerned over this and recommended that he take a picture of it and return here in several days for recheck. If he gets worse, recommend going to the emergency room.

## 2017-03-21 ENCOUNTER — Encounter: Payer: Self-pay | Admitting: Family Medicine

## 2017-03-21 ENCOUNTER — Ambulatory Visit (INDEPENDENT_AMBULATORY_CARE_PROVIDER_SITE_OTHER): Payer: 59 | Admitting: Family Medicine

## 2017-03-21 ENCOUNTER — Other Ambulatory Visit: Payer: Self-pay

## 2017-03-21 VITALS — BP 124/68 | HR 68 | Ht 76.0 in | Wt 398.0 lb

## 2017-03-21 DIAGNOSIS — H9311 Tinnitus, right ear: Secondary | ICD-10-CM

## 2017-03-21 DIAGNOSIS — L03211 Cellulitis of face: Secondary | ICD-10-CM | POA: Diagnosis not present

## 2017-03-21 DIAGNOSIS — J309 Allergic rhinitis, unspecified: Secondary | ICD-10-CM | POA: Diagnosis not present

## 2017-03-21 MED ORDER — AZELASTINE-FLUTICASONE 137-50 MCG/ACT NA SUSP: NASAL | 5 refills | Status: DC

## 2017-03-21 MED ORDER — MONTELUKAST SODIUM 10 MG PO TABS: 10.0000 mg | ORAL_TABLET | Freq: Every day | ORAL | 5 refills | Status: DC

## 2017-03-21 MED ORDER — METHOCARBAMOL 500 MG PO TABS: 500.0000 mg | ORAL_TABLET | Freq: Four times a day (QID) | ORAL | 1 refills | Status: DC

## 2017-03-21 NOTE — Progress Notes (Signed)
   Subjective:    Patient ID: David [name], male    DOB: 12/02/1955, 61 y.o.   MRN: [id number]  HPI He is here for recheck. He states that he is doing much better since being on the antibiotic for the infection and swelling on his face. He does continue have difficulty with tinnitus in the right ear. He also complains of continued difficulty with nasal congestion watery eyes but no rhinorrhea or sneezing. He has tried Flonase without much success. He has tried antihistamines but cannot use decongestants due to his hypertension. He also would like a refill on his Robaxin that he uses very sparingly when he has neck discomfort.  Review of Systems     Objective:   Physical Exam Alert and in no distress. Exam of his face does show much less erythema. TMs were Dick difficult to see but appeared somewhat normal. Neck is supple without adenopathy.       Assessment & Plan:  Cellulitis of face  Allergic rhinitis, unspecified seasonality, unspecified trigger  Tinnitus of right ear He is to continue on his antibiotic and call me if he is not entirely back to normal. I will also add Singulair as well as diagnosed to his regimen. We will set him up to see ENT in several weeks follow-up on his tinnitus as his hearing is very important to his work.

## 2017-03-27 ENCOUNTER — Telehealth: Payer: Self-pay | Admitting: Family Medicine

## 2017-03-27 DIAGNOSIS — L03211 Cellulitis of face: Secondary | ICD-10-CM

## 2017-03-27 MED ORDER — AMOXICILLIN-POT CLAVULANATE 875-125 MG PO TABS: 1.0000 | ORAL_TABLET | Freq: Two times a day (BID) | ORAL | 0 refills | Status: DC

## 2017-03-27 NOTE — Telephone Encounter (Signed)
Pt called and stated that he is not much better. He has one more pill left. He thinks he may need another round. Pt uses [facility] on [address] and can be reached at [phone].

## 2017-04-07 ENCOUNTER — Telehealth: Payer: Self-pay | Admitting: Family Medicine

## 2017-04-07 NOTE — Telephone Encounter (Signed)
P.A. DYMISTA

## 2017-04-09 ENCOUNTER — Ambulatory Visit (INDEPENDENT_AMBULATORY_CARE_PROVIDER_SITE_OTHER): Payer: 59 | Admitting: Family Medicine

## 2017-04-09 ENCOUNTER — Encounter: Payer: Self-pay | Admitting: Family Medicine

## 2017-04-09 VITALS — BP 136/82 | HR 120 | Temp 99.3°F | Wt 395.0 lb

## 2017-04-09 DIAGNOSIS — H9311 Tinnitus, right ear: Secondary | ICD-10-CM | POA: Diagnosis not present

## 2017-04-09 DIAGNOSIS — L03211 Cellulitis of face: Secondary | ICD-10-CM

## 2017-04-09 LAB — CBC WITH DIFFERENTIAL/PLATELET
BASOS ABS: 0 {cells}/uL (ref 0–200)
Basophils Relative: 0 %
Eosinophils Absolute: 232 cells/uL (ref 15–500)
Eosinophils Relative: 2 %
HEMATOCRIT: 44.7 % (ref 38.5–50.0)
HEMOGLOBIN: 15.2 g/dL (ref 13.2–17.1)
LYMPHS ABS: 2900 {cells}/uL (ref 850–3900)
Lymphocytes Relative: 25 %
MCH: 31 pg (ref 27.0–33.0)
MCHC: 34 g/dL (ref 32.0–36.0)
MCV: 91 fL (ref 80.0–100.0)
MONO ABS: 1044 {cells}/uL — AB (ref 200–950)
MPV: 9.3 fL (ref 7.5–12.5)
Monocytes Relative: 9 %
Neutro Abs: 7424 cells/uL (ref 1500–7800)
Neutrophils Relative %: 64 %
Platelets: 264 10*3/uL (ref 140–400)
RBC: 4.91 MIL/uL (ref 4.20–5.80)
RDW: 14.5 % (ref 11.0–15.0)
WBC: 11.6 10*3/uL — ABNORMAL HIGH (ref 4.0–10.5)

## 2017-04-09 MED ORDER — PREDNISONE 10 MG (48) PO TBPK: ORAL_TABLET | ORAL | 0 refills | Status: DC

## 2017-04-09 MED ORDER — LEVOFLOXACIN 500 MG PO TABS: 500.0000 mg | ORAL_TABLET | Freq: Every day | ORAL | 0 refills | Status: DC

## 2017-04-09 MED ORDER — METHYLPREDNISOLONE ACETATE 80 MG/ML IJ SUSP
80.0000 mg | Freq: Once | INTRAMUSCULAR | Status: AC
  Administered 2017-04-09: 80 mg via INTRAMUSCULAR

## 2017-04-09 MED ORDER — TRAMADOL HCL 50 MG PO TABS: 50.0000 mg | ORAL_TABLET | Freq: Four times a day (QID) | ORAL | 0 refills | Status: DC | PRN

## 2017-04-09 NOTE — Progress Notes (Signed)
   Subjective:    Patient ID: David [name], male    DOB: 18-May-1956, 61 y.o.   MRN: [id number]  HPI He is here for a recheck. He states that in spite of being on the antibiotic, the redness swelling and pain actually got worse. He is now having more difficulty with hearing.   Review of Systems     Objective:   Physical Exam Alert and complaining of facial as well as ear pain. Generalized erythema is noted to the face especially both ears with swelling making it difficult to see into the canal.       Assessment & Plan:  Cellulitis of face - Plan: CBC with Differential/Platelet, Comprehensive metabolic panel, HIV antibody, levofloxacin (LEVAQUIN) 500 MG tablet, predniSONE (STERAPRED UNI-PAK 48 TAB) 10 MG (48) TBPK tablet, traMADol (ULTRAM) 50 MG tablet, methylPREDNISolone acetate (DEPO-MEDROL) injection 80 mg, RPR, CANCELED: RPR  Tinnitus of right ear - Plan: predniSONE (STERAPRED UNI-PAK 48 TAB) 10 MG (48) TBPK tablet  I will switch him to a different antibiotic and do routine blood screening on him. May possibly need to have infectious disease see him. Polychondritis is certainly a possibility however I'm not familiar with whether it would affect the face to this extent.

## 2017-04-10 LAB — COMPREHENSIVE METABOLIC PANEL
ALT: 26 U/L (ref 9–46)
AST: 20 U/L (ref 10–35)
Albumin: 4.1 g/dL (ref 3.6–5.1)
Alkaline Phosphatase: 94 U/L (ref 40–115)
BUN: 18 mg/dL (ref 7–25)
CALCIUM: 9.3 mg/dL (ref 8.6–10.3)
CHLORIDE: 100 mmol/L (ref 98–110)
CO2: 21 mmol/L (ref 20–32)
Creat: 1.01 mg/dL (ref 0.70–1.25)
Glucose, Bld: 86 mg/dL (ref 65–99)
Potassium: 4.4 mmol/L (ref 3.5–5.3)
Sodium: 136 mmol/L (ref 135–146)
Total Bilirubin: 0.9 mg/dL (ref 0.2–1.2)
Total Protein: 7.3 g/dL (ref 6.1–8.1)

## 2017-04-10 LAB — RPR

## 2017-04-10 LAB — HIV ANTIBODY (ROUTINE TESTING W REFLEX): HIV 1&2 Ab, 4th Generation: NONREACTIVE

## 2017-04-13 NOTE — Telephone Encounter (Signed)
P.A. Sande Rives Denied, pt needs trial and failure of Flonase in combination with azelastine. Do you want to switch?

## 2017-04-15 NOTE — Telephone Encounter (Signed)
Let him know that we have to try Flonase and Astepro separately and see if this works

## 2017-04-17 DIAGNOSIS — J3489 Other specified disorders of nose and nasal sinuses: Secondary | ICD-10-CM | POA: Insufficient documentation

## 2017-04-17 DIAGNOSIS — H906 Mixed conductive and sensorineural hearing loss, bilateral: Secondary | ICD-10-CM | POA: Diagnosis not present

## 2017-04-17 DIAGNOSIS — H903 Sensorineural hearing loss, bilateral: Secondary | ICD-10-CM | POA: Insufficient documentation

## 2017-04-17 DIAGNOSIS — J34829 Nasal valve collapse, unspecified: Secondary | ICD-10-CM | POA: Insufficient documentation

## 2017-04-17 DIAGNOSIS — H9311 Tinnitus, right ear: Secondary | ICD-10-CM | POA: Diagnosis not present

## 2017-04-25 MED ORDER — FLUTICASONE PROPIONATE 50 MCG/ACT NA SUSP: 2.0000 | Freq: Every day | NASAL | 6 refills | Status: DC

## 2017-04-25 MED ORDER — AZELASTINE HCL 0.15 % NA SOLN: 2.0000 | Freq: Two times a day (BID) | NASAL | 11 refills | Status: DC

## 2017-04-25 NOTE — Telephone Encounter (Signed)
Pt is willing to try the Flonase and Astepro together, he has tried the [facility] by itself before and it didn't work.  Please send in both by prescription to see if insurance will pay instead of him buying it over the counter.

## 2017-04-26 DIAGNOSIS — H60513 Acute actinic otitis externa, bilateral: Secondary | ICD-10-CM | POA: Diagnosis not present

## 2017-04-26 DIAGNOSIS — J310 Chronic rhinitis: Secondary | ICD-10-CM | POA: Diagnosis not present

## 2017-04-29 DIAGNOSIS — H60333 Swimmer's ear, bilateral: Secondary | ICD-10-CM | POA: Insufficient documentation

## 2017-05-14 DIAGNOSIS — H60333 Swimmer's ear, bilateral: Secondary | ICD-10-CM | POA: Diagnosis not present

## 2017-05-14 DIAGNOSIS — L219 Seborrheic dermatitis, unspecified: Secondary | ICD-10-CM | POA: Diagnosis not present

## 2017-06-07 ENCOUNTER — Other Ambulatory Visit: Payer: Self-pay | Admitting: Internal Medicine

## 2017-07-05 ENCOUNTER — Other Ambulatory Visit: Payer: Self-pay | Admitting: Family Medicine

## 2017-07-31 ENCOUNTER — Telehealth: Payer: Self-pay | Admitting: Family Medicine

## 2017-07-31 NOTE — Telephone Encounter (Signed)
Pt called and is requesting you to give him a call, about a weight loss place that he heard about, he can be reached at [phone]

## 2017-08-03 ENCOUNTER — Other Ambulatory Visit: Payer: Self-pay | Admitting: Family Medicine

## 2017-08-21 ENCOUNTER — Ambulatory Visit: Payer: 59 | Admitting: Internal Medicine

## 2017-08-21 ENCOUNTER — Encounter: Payer: Self-pay | Admitting: Internal Medicine

## 2017-08-21 DIAGNOSIS — I872 Venous insufficiency (chronic) (peripheral): Secondary | ICD-10-CM

## 2017-08-21 DIAGNOSIS — I480 Paroxysmal atrial fibrillation: Secondary | ICD-10-CM | POA: Diagnosis not present

## 2017-08-21 DIAGNOSIS — I63412 Cerebral infarction due to embolism of left middle cerebral artery: Secondary | ICD-10-CM | POA: Diagnosis not present

## 2017-08-21 DIAGNOSIS — R600 Localized edema: Secondary | ICD-10-CM

## 2017-08-21 DIAGNOSIS — G4733 Obstructive sleep apnea (adult) (pediatric): Secondary | ICD-10-CM

## 2017-08-21 NOTE — Patient Instructions (Addendum)
Dr. Debara Pickett recommends KNEE HIGH COMPRESSION STOCKINGS  -- 20-30 mmHg (compression strength) -- [facility]  -- [address]  -- [place]   -- [phone]    Your physician wants you to follow-up in: 6 months with Dr. Debara Pickett. You will receive a reminder letter in the mail two months in advance. If you don't receive a letter, please call our office to schedule the follow-up appointment.   How to Use Compression Stockings Compression stockings are elastic socks that squeeze the legs. They help to increase blood flow to the legs, decrease swelling in the legs, and reduce the chance of developing blood clots in the lower legs. Compression stockings are often used by people who:  Are recovering from surgery.  Have poor circulation in their legs.  Are prone to getting blood clots in their legs.  Have varicose veins.  Sit or stay in bed for long periods of time. How to use compression stockings Before you put on your compression stockings:  Make sure that they are the correct size. If you do not know your size, ask your health care provider.  Make sure that they are clean, dry, and in good condition.  Check them for rips and tears. Do not put them on if they are ripped or torn. Put your stockings on first thing in the morning, before you get out of bed. Keep them on for as long as your health care provider advises. When you are wearing your stockings:  Keep them as smooth as possible. Do not allow them to bunch up. It is especially important to prevent the stockings from bunching up around your toes or behind your knees.  Do not roll the stockings downward and leave them rolled down. This can decrease blood flow to your leg.  Change them right away if they become wet or dirty. When you take off your stockings, inspect your legs and feet. Anything that does not seem normal may require  medical attention. Look for:  Open sores.  Red spots.  Swelling. Information and tips  Do not stop wearing your compression stockings without talking to your health care provider first.  Wash your stockings every day with mild detergent in cold or warm water. Do not use bleach. Air-dry your stockings or dry them in a clothes dryer on low heat.  Replace your stockings every 3-6 months.  If skin moisturizing is part of your treatment plan, apply lotion or cream at night so that your skin will be dry when you put on the stockings in the morning. It is harder to put the stockings on when you have lotion on your legs or feet. Contact a health care provider if: Remove your stockings and seek medical care if:  You have a feeling of pins and needles in your feet or legs.  You have any new changes in your skin.  You have skin lesions that are getting worse.  You have swelling or pain that is getting worse. Get help right away if:  You have numbness or tingling in your lower legs that does not get better right after you take the stockings off.  Your toes or feet become cold and blue.  You develop open sores or red spots on your legs that do not go away.  You see or feel a warm spot on your leg.  You have new swelling or soreness in your leg.  You are short of breath or you have chest pain for no reason.  You have a rapid or irregular heartbeat.  You feel light-headed or dizzy. This information is not intended to replace advice given to you by your health care provider. Make sure you discuss any questions you have with your health care provider. Document Released: 06/03/2009 Document Revised: 01/04/2016 Document Reviewed: 07/14/2014 Elsevier Interactive Patient Education  2017 [employer].
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
## 2017-08-28 ENCOUNTER — Other Ambulatory Visit: Payer: Self-pay | Admitting: Internal Medicine

## 2017-08-28 ENCOUNTER — Other Ambulatory Visit: Payer: Self-pay | Admitting: Family Medicine

## 2017-11-26 ENCOUNTER — Other Ambulatory Visit: Payer: Self-pay | Admitting: Family Medicine

## 2017-12-23 ENCOUNTER — Other Ambulatory Visit: Payer: Self-pay | Admitting: Internal Medicine

## 2017-12-23 ENCOUNTER — Other Ambulatory Visit: Payer: Self-pay | Admitting: Family Medicine

## 2017-12-23 NOTE — Telephone Encounter (Signed)
REFILL 

## 2017-12-23 NOTE — Telephone Encounter (Signed)
Please advise if xarelto is ok to fill pt has not had blood work in the past 30 days. Will call to make appt .[place]

## 2018-01-18 ENCOUNTER — Other Ambulatory Visit: Payer: Self-pay | Admitting: Family Medicine

## 2018-01-18 ENCOUNTER — Other Ambulatory Visit: Payer: Self-pay | Admitting: Internal Medicine

## 2018-01-20 NOTE — Telephone Encounter (Signed)
REFILL 

## 2018-02-13 ENCOUNTER — Ambulatory Visit: Payer: 59 | Admitting: Family Medicine

## 2018-02-13 ENCOUNTER — Telehealth: Payer: Self-pay | Admitting: Family Medicine

## 2018-02-13 ENCOUNTER — Encounter: Payer: Self-pay | Admitting: Family Medicine

## 2018-02-13 ENCOUNTER — Other Ambulatory Visit: Payer: Self-pay | Admitting: Family Medicine

## 2018-02-13 DIAGNOSIS — I10 Essential (primary) hypertension: Secondary | ICD-10-CM | POA: Diagnosis not present

## 2018-02-13 DIAGNOSIS — I63412 Cerebral infarction due to embolism of left middle cerebral artery: Secondary | ICD-10-CM

## 2018-02-13 DIAGNOSIS — G4733 Obstructive sleep apnea (adult) (pediatric): Secondary | ICD-10-CM

## 2018-02-13 DIAGNOSIS — I480 Paroxysmal atrial fibrillation: Secondary | ICD-10-CM | POA: Diagnosis not present

## 2018-02-13 DIAGNOSIS — Z23 Encounter for immunization: Secondary | ICD-10-CM | POA: Diagnosis not present

## 2018-02-13 DIAGNOSIS — I350 Nonrheumatic aortic (valve) stenosis: Secondary | ICD-10-CM

## 2018-02-13 DIAGNOSIS — I4510 Unspecified right bundle-branch block: Secondary | ICD-10-CM

## 2018-02-13 DIAGNOSIS — Z87891 Personal history of nicotine dependence: Secondary | ICD-10-CM | POA: Diagnosis not present

## 2018-02-13 DIAGNOSIS — R600 Localized edema: Secondary | ICD-10-CM

## 2018-02-13 MED ORDER — ATORVASTATIN CALCIUM 10 MG PO TABS: ORAL_TABLET | ORAL | 3 refills | Status: DC

## 2018-02-13 MED ORDER — RIVAROXABAN 20 MG PO TABS: ORAL_TABLET | ORAL | 3 refills | Status: DC

## 2018-02-13 MED ORDER — METOPROLOL SUCCINATE ER 50 MG PO TB24: ORAL_TABLET | ORAL | 3 refills | Status: DC

## 2018-02-13 MED ORDER — DILTIAZEM HCL ER COATED BEADS 120 MG PO CP24: ORAL_CAPSULE | ORAL | 3 refills | Status: DC

## 2018-02-13 MED ORDER — VALSARTAN-HYDROCHLOROTHIAZIDE 160-25 MG PO TABS: 1.0000 | ORAL_TABLET | Freq: Every day | ORAL | 3 refills | Status: DC

## 2018-02-13 NOTE — Telephone Encounter (Signed)
Pt called requesting a refill on Methocarbamol 500 mg. He forgot to ask for a refill on this today during his appointment

## 2018-02-13 NOTE — Progress Notes (Signed)
   Subjective:    Patient ID: David [name], male    DOB: 1956/03/30, 62 y.o.   MRN: [id number]  HPI He is here for an interval evaluation.  He is using his CPAP machine and is very happy with it.  He is allowing him to get adequate sleep and he wakes up rested.  He does however complain that over the last year he has noted increasing shortness of breath with any physical activity.  He can walk up a flight of steps and sometimes take a minute or 2 to get his breath back.  He does not complain of chest pain, PND but does have distal edema.  Does have underlying atrial fibrillation and is presently on Xarelto for this.  He does have concerns about his weight but so far has been unable to make any major changes.  Also has a previous history of CVA and at that point is when he quit smoking.  He has not seen his cardiologist recently.   Review of Systems     Objective:   Physical Exam Alert and in no distress. Tympanic membranes and canals are normal. Pharyngeal area is normal. Neck is supple without adenopathy or thyromegaly. Cardiac exam shows an irregular  rhythm, 2/6 SEM,no gallops. Lungs are clear to auscultation.  EKG does show RBBB with atrial fib      Assessment & Plan:  Morbid obesity due to excess calories ([place]) - Plan: CBC with Differential/Platelet, Comprehensive metabolic panel, Lipid panel  Need for vaccination against Streptococcus pneumoniae - Plan: Pneumococcal conjugate vaccine 13-valent  Nonrheumatic aortic valve stenosis - Plan: EKG 12-Lead, Ambulatory referral to Cardiology  Former smoker  Essential hypertension - Plan: CBC with Differential/Platelet, Comprehensive metabolic panel  Lower extremity edema  OSA treated with BiPAP  PAF (paroxysmal atrial fibrillation) ([place]) - Plan: EKG 12-Lead, Ambulatory referral to Cardiology  RBBB - Plan: EKG 12-Lead, Ambulatory referral to Cardiology  Cerebrovascular accident (CVA) due to embolism of left middle cerebral artery  ([place]) I discussed at length his weight in regard to diet, exercise, medications, surgery.  [facility] plans to [facility] a [facility] and see if they will work with him.  We will also send to cardiology as his symptoms do sound like they could be Cardiac in nature. He will continue on his present medication regimen area I will also put him on a list to get Shingrix. He plans to go to [facility] for weight loss.  I encouraged him to do that.

## 2018-02-14 LAB — LIPID PANEL
CHOL/HDL RATIO: 4.3 ratio (ref 0.0–5.0)
Cholesterol, Total: 139 mg/dL (ref 100–199)
HDL: 32 mg/dL — AB (ref >39)
LDL Calculated: 84 mg/dL (ref 0–99)
Triglycerides: 115 mg/dL (ref 0–149)
VLDL Cholesterol Cal: 23 mg/dL (ref 5–40)

## 2018-02-14 LAB — COMPREHENSIVE METABOLIC PANEL
ALT: 24 IU/L (ref 0–44)
AST: 17 IU/L (ref 0–40)
Albumin/Globulin Ratio: 1.3 (ref 1.2–2.2)
Albumin: 4.2 g/dL (ref 3.6–4.8)
Alkaline Phosphatase: 86 IU/L (ref 39–117)
BILIRUBIN TOTAL: 0.7 mg/dL (ref 0.0–1.2)
BUN / CREAT RATIO: 18 (ref 10–24)
BUN: 17 mg/dL (ref 8–27)
CO2: 21 mmol/L (ref 20–29)
Calcium: 9.4 mg/dL (ref 8.6–10.2)
Chloride: 104 mmol/L (ref 96–106)
Creatinine, Ser: 0.94 mg/dL (ref 0.76–1.27)
GFR, EST AFRICAN AMERICAN: 101 mL/min/{1.73_m2} (ref >59)
GFR, EST NON AFRICAN AMERICAN: 87 mL/min/{1.73_m2} (ref >59)
GLUCOSE: 109 mg/dL — AB (ref 65–99)
Globulin, Total: 3.3 g/dL (ref 1.5–4.5)
Potassium: 4.2 mmol/L (ref 3.5–5.2)
SODIUM: 141 mmol/L (ref 134–144)
Total Protein: 7.5 g/dL (ref 6.0–8.5)

## 2018-02-14 LAB — CBC WITH DIFFERENTIAL/PLATELET
BASOS ABS: 0.1 10*3/uL (ref 0.0–0.2)
Basos: 1 %
EOS (ABSOLUTE): 0.3 10*3/uL (ref 0.0–0.4)
Eos: 3 %
Hematocrit: 47.1 % (ref 37.5–51.0)
Hemoglobin: 15.8 g/dL (ref 13.0–17.7)
Immature Grans (Abs): 0 10*3/uL (ref 0.0–0.1)
Immature Granulocytes: 0 %
LYMPHS ABS: 2.2 10*3/uL (ref 0.7–3.1)
LYMPHS: 28 %
MCH: 30.1 pg (ref 26.6–33.0)
MCHC: 33.5 g/dL (ref 31.5–35.7)
MCV: 90 fL (ref 79–97)
MONOCYTES: 8 %
Monocytes Absolute: 0.6 10*3/uL (ref 0.1–0.9)
NEUTROS ABS: 4.8 10*3/uL (ref 1.4–7.0)
Neutrophils: 60 %
PLATELETS: 250 10*3/uL (ref 150–450)
RBC: 5.25 x10E6/uL (ref 4.14–5.80)
RDW: 13.6 % (ref 12.3–15.4)
WBC: 7.9 10*3/uL (ref 3.4–10.8)

## 2018-02-14 MED ORDER — METHOCARBAMOL 500 MG PO TABS: 500.0000 mg | ORAL_TABLET | Freq: Four times a day (QID) | ORAL | 1 refills | Status: DC

## 2018-02-21 ENCOUNTER — Telehealth: Payer: Self-pay | Admitting: Internal Medicine

## 2018-02-21 NOTE — Telephone Encounter (Signed)
Left message for pt to call back and schedule to get his shringrix shots. 2 saved in refrig.

## 2018-03-11 ENCOUNTER — Encounter: Payer: Self-pay | Admitting: Physician Assistant

## 2018-03-11 ENCOUNTER — Ambulatory Visit: Payer: 59 | Admitting: Physician Assistant

## 2018-03-11 VITALS — BP 133/74 | HR 74 | Ht 76.0 in | Wt >= 6400 oz

## 2018-03-11 DIAGNOSIS — I480 Paroxysmal atrial fibrillation: Secondary | ICD-10-CM

## 2018-03-11 DIAGNOSIS — R0600 Dyspnea, unspecified: Secondary | ICD-10-CM

## 2018-03-11 DIAGNOSIS — E785 Hyperlipidemia, unspecified: Secondary | ICD-10-CM

## 2018-03-11 DIAGNOSIS — I350 Nonrheumatic aortic (valve) stenosis: Secondary | ICD-10-CM

## 2018-03-11 DIAGNOSIS — R0609 Other forms of dyspnea: Secondary | ICD-10-CM | POA: Diagnosis not present

## 2018-03-11 DIAGNOSIS — I10 Essential (primary) hypertension: Secondary | ICD-10-CM

## 2018-03-11 DIAGNOSIS — I4510 Unspecified right bundle-branch block: Secondary | ICD-10-CM

## 2018-03-11 DIAGNOSIS — G4733 Obstructive sleep apnea (adult) (pediatric): Secondary | ICD-10-CM

## 2018-03-11 DIAGNOSIS — Z9989 Dependence on other enabling machines and devices: Secondary | ICD-10-CM

## 2018-03-11 NOTE — Progress Notes (Signed)
Cardiology Office Note    Date:  03/13/2018   ID:  David [name], DOB 1956-06-05, MRN [id number]  PCP:  Denita Lung, MD  Cardiologist:  Dr. Debara Pickett  Chief Complaint  Patient presents with  . Follow-up    seen for Dr. Debara Pickett.     History of Present Illness:  David [name] is a 62 y.o. male with PMH of AFib felt to be related to OSA (no recurrence once started on CPAP), tobacco abuse, hypertension, hyperlipidemia, morbid obesity, and chronic right bundle branch block.  Last Myoview obtained on 05/10/2015 showed EF 60%, no ischemia, overall low risk study.  Last echocardiogram obtained on 06/23/2016 showed EF 65 to 70%, moderate aortic stenosis, grade 1 DD, mildly dilated aortic root of 40 mm.  His atrial fibrillation was doing very well for a long time after controlling obstructive sleep apnea using CPAP.  However in recent years he has been having recurrent atrial fibrillation.  He had evidence of stroke based on neuroimaging previously and has been placed on Xarelto.  Patient presents today for cardiology office visit.  Based on recent EKG obtained by his primary care provider, he has went back into atrial fibrillation.  He has been compliant with Xarelto.  Even when in atrial fibrillation, he does not seems to have obvious cardiac awareness.  He is fairly rate controlled on current medication.  He does have some dyspnea and fatigue at baseline, however this may be related to his morbid obesity.  Compared to earlier this year, he has gained roughly 40 pounds.  He does have 1-2+ pitting edema in lower extremity, however he says this is unchanged compared to earlier this year.  His lung is clear that he has no orthopnea or PND.  His weight gain may not be related to volume overload.  I will hold off on adding diuretic at this time.  We discussed possibility of converting him using a DC cardioversion, he was to hold it off at this point.  I also recommended a echocardiogram to assess heart murmur and  assess ejection fraction given the recent recurrent atrial fibrillation.   Past Medical History:  Diagnosis Date  . Allergy    RHINITIS  . Arthritis    thumb  . GERD (gastroesophageal reflux disease)    "not really"  . History of echocardiogram 03/29/2009   EF 50-55%; mild concentric LVH;   . History of nuclear stress test 08/28/2007   moderate diaphragmatic attenuation; EKG neg for ischemia   . Hyperlipidemia   . Hypertension    takes Diovan and Toprol  . Obesity   . PAF (paroxysmal atrial fibrillation) (HCC)    associated with OSA  . Right bundle branch block   . Rosacea   . Shortness of breath    With activity  . Sleep apnea    CPAP  . Smoker   . Systolic murmur     Past Surgical History:  Procedure Laterality Date  . FINGER SURGERY     Reconstruction of Left thumb and L index finger.  Reconstruction   . KNEE ARTHROSCOPY    . SHOULDER CLOSED REDUCTION     Procedure: CLOSED REDUCTION SHOULDER;  Surgeon: Lawrence Santiago [facility];  Location: [place];  Service: Orthopedics;  Laterality: Right;  . SHOULDER OPEN ROTATOR CUFF REPAIR  08/11/2011   Procedure: ROTATOR CUFF REPAIR SHOULDER OPEN;  Surgeon: Willa Frater III;  Location: [place];  Service: Orthopedics;  Laterality: Right;  . TONSILLECTOMY  as a child    Current Medications: Outpatient Medications Prior to Visit  Medication Sig Dispense Refill  . ALPRAZolam (XANAX) 1 MG tablet Take 1 tablet (1 mg total) by mouth at bedtime as needed. sleep 30 tablet 1  . atorvastatin (LIPITOR) 10 MG tablet TAKE 1 TABLET BY MOUTH EVERY DAY AT 6PM 90 tablet 3  . diltiazem (CARDIZEM CD) 120 MG 24 hr capsule TAKE 1 CAPSULE(120 MG) BY MOUTH DAILY 90 capsule 3  . methocarbamol (ROBAXIN) 500 MG tablet Take 1 tablet (500 mg total) by mouth 4 (four) times daily. 30 tablet 1  . metoprolol succinate (TOPROL-XL) 50 MG 24 hr tablet TAKE 1 TABLET(50 MG) BY MOUTH DAILY 90 tablet 3  . rivaroxaban (XARELTO) 20 MG TABS tablet TAKE 1 TABLET(20 MG)  BY MOUTH DAILY WITH SUPPER 90 tablet 3  . traMADol (ULTRAM) 50 MG tablet Take 1 tablet (50 mg total) by mouth every 6 (six) hours as needed. 30 tablet 0  . valsartan-hydrochlorothiazide (DIOVAN-HCT) 160-25 MG tablet Take 1 tablet by mouth daily. 90 tablet 3  . montelukast (SINGULAIR) 10 MG tablet TAKE 1 TABLET(10 MG) BY MOUTH AT BEDTIME 30 tablet 0  . predniSONE (STERAPRED UNI-PAK 48 TAB) 10 MG (48) TBPK tablet Use as per manufacturer's recommendation 48 tablet 0  . Azelastine HCl 0.15 % SOLN Place 2 sprays into the nose 2 (two) times daily. (Patient not taking: Reported on 02/13/2018) 30 mL 11  . fluticasone (FLONASE) 50 MCG/ACT nasal spray Place 2 sprays into both nostrils daily. (Patient not taking: Reported on 02/13/2018) 16 g 6  . levofloxacin (LEVAQUIN) 500 MG tablet Take 1 tablet (500 mg total) by mouth daily. (Patient not taking: Reported on 02/13/2018) 7 tablet 0   No facility-administered medications prior to visit.      Allergies:   Codeine and Morphine and related   Social History   Socioeconomic History  . Marital status: Single    Spouse name: Not on file  . Number of children: Not on file  . Years of education: Not on file  . Highest education level: Not on file  Occupational History  . Not on file  Social Needs  . Financial resource strain: Not on file  . Food insecurity:    Worry: Not on file    Inability: Not on file  . Transportation needs:    Medical: Not on file    Non-medical: Not on file  Tobacco Use  . Smoking status: Former Smoker    Packs/day: 1.00    Years: 40.00    Pack years: 40.00    Types: Cigarettes    Last attempt to quit: 06/20/2016    Years since quitting: 1.7  . Smokeless tobacco: Never Used  Substance and Sexual Activity  . Alcohol use: Yes    Alcohol/week: 6.6 oz    Types: 6 Cans of beer, 5 Shots of liquor per week  . Drug use: No  . Sexual activity: Not Currently  Lifestyle  . Physical activity:    Days per week: Not on file     Minutes per session: Not on file  . Stress: Not on file  Relationships  . Social connections:    Talks on phone: Not on file    Gets together: Not on file    Attends religious service: Not on file    Active member of club or organization: Not on file    Attends meetings of clubs or organizations: Not on file    Relationship  status: Not on file  Other Topics Concern  . Not on file  Social History Narrative  . Not on file     Family History:  The patient's family history includes Cancer in his paternal uncle; Cancer - Colon in his father; Cancer - Prostate in his maternal grandfather and paternal grandfather; Heart attack (age of onset: 18) in his father; Heart disease in his mother.   ROS:   Please see the history of present illness.    ROS All other systems reviewed and are negative.   PHYSICAL EXAM:   VS:  BP 133/74   Pulse 74   Ht 6\' 4"  (1.93 m)   Wt (!) 438 lb 3.2 oz (198.8 kg)   BMI 53.34 kg/m    GEN: Well nourished, well developed, in no acute distress  HEENT: normal  Neck: no JVD, carotid bruits, or masses Cardiac: Irregularly irregular; no rubs, or gallops. 2+ pitting edema.  1/6 systolic murmur Respiratory:  clear to auscultation bilaterally, normal work of breathing GI: soft, nontender, nondistended, + BS MS: no deformity or atrophy  Skin: warm and dry, no rash Neuro:  Alert and Oriented x 3, Strength and sensation are intact Psych: euthymic mood, full affect  Wt Readings from Last 3 Encounters:  03/11/18 (!) 438 lb 3.2 oz (198.8 kg)  02/13/18 (!) 414 lb (187.8 kg)  08/21/17 (!) 394 lb 6.4 oz (178.9 kg)      Studies/Labs Reviewed:   EKG:  EKG is ordered today.  The ekg ordered today demonstrates atrial fibrillation  Recent Labs: 02/13/2018: ALT 24; BUN 17; Creatinine, Ser 0.94; Hemoglobin 15.8; Platelets 250; Potassium 4.2; Sodium 141   Lipid Panel    Component Value Date/Time   CHOL 139 02/13/2018 1214   TRIG 115 02/13/2018 1214   HDL 32 (L)  02/13/2018 1214   CHOLHDL 4.3 02/13/2018 1214   CHOLHDL 6.8 06/22/2016 0318   VLDL 22 06/22/2016 0318   LDLCALC 84 02/13/2018 1214    Additional studies/ records that were reviewed today include:   Myoview 05/18/2015 Study Highlights    Nuclear stress EF: 60%.  The left ventricular ejection fraction is normal (55-65%).  There was no ST segment deviation noted during stress.  The study is normal.  This is a low risk study.    Echo 06/23/2016 LV EF: 65% -   70% Study Conclusions  - Left ventricle: The cavity size was normal. Systolic function was   vigorous. The estimated ejection fraction was in the range of 65%   to 70%. Wall motion was normal; there were no regional wall   motion abnormalities. Doppler parameters are consistent with   abnormal left ventricular relaxation (grade 1 diastolic   dysfunction). - Aortic valve: Valve mobility was restricted. There was moderate   stenosis. There was mild regurgitation. Peak velocity (S): 340   cm/s. Mean gradient (S): 26 mm Hg. Valve area (VTI): 1.22 cm^2.   Valve area (Vmax): 1.12 cm^2. Valve area (Vmean): 1.14 cm^2. - Aorta: Aortic root dimension: 40 mm (ED). - Mitral valve: Calcified annulus. - Left atrium: The atrium was mildly dilated.  Impressions:  - Since last tracing aortic stenosis has mildly increased in   severity.   ASSESSMENT:    1. DOE (dyspnea on exertion)   2. Aortic valve stenosis, etiology of cardiac valve disease unspecified   3. PAF (paroxysmal atrial fibrillation) ([place])   4. OSA on CPAP   5. Essential hypertension   6. Hyperlipidemia, unspecified hyperlipidemia type  7. Morbid obesity ([place])   8. RBBB      PLAN:  In order of problems listed above:  1. Dyspnea on exertion: He did gain about 40 pounds, however his lung is clear and there is no PND.  He says his lower extremity edema is chronic and has not changed in the past year.  I will hold off on adding a diuretic.  Although if his  weight continue to increase, we can do a trial of Lasix.  Unclear contribution of atrial fibrillation to his dyspnea on exertion, however I think majority of the dyspnea on exertion is related to his weight  2. Heart murmur: Obtain repeat echocardiogram, history of aortic stenosis  3. PAF: Compliant with Xarelto.  Very well rate controlled.  He has went back into atrial fibrillation based on recent EKG however he does not seems to be interested in DC cardioversion at this time.  I think majority of his symptom is related to his weight, therefore there is no urgency with DC cardioversion.  4. Obstructive sleep apnea on CPAP: Continue with CPAP therapy  5. Hypertension: Blood pressure very well controlled.    6. Hyperlipidemia: On Lipitor 80 mg daily. Total cholesterol 139, triglyceride 115, HDL 32, LDL 84.  7. Morbid obesity: Weight gain recently, emphasized on the need to increase activity.  Patient admits he has not been exercising due to recent heat.    Medication Adjustments/Labs and Tests Ordered: Current medicines are reviewed at length with the patient today.  Concerns regarding medicines are outlined above.  Medication changes, Labs and Tests ordered today are listed in the Patient Instructions below. Patient Instructions  Medication Instructions:   Your physician recommends that you continue on your current medications as directed. Please refer to the Current Medication list given to you today.   If you need a refill on your cardiac medications before your next appointment, please call your pharmacy.  Labwork: NONE ORDERED  TODAY    Testing/Procedures: Your physician has requested that you have an echocardiogram. Echocardiography is a painless test that uses sound waves to create images of your heart. It provides your doctor with information about the size and shape of your heart and how well your heart's chambers and valves are working. This procedure takes approximately one  hour. There are no restrictions for this procedure.     Follow-Up: DR HILTY IN 3 TO 4 MONTHS    Any Other Special Instructions Will Be Listed Below (If Applicable).                                                                                                                                                      Hilbert Corrigan, [place]  03/13/2018 11:25 PM    [facility] [place], [place], [place]  [address]  Phone: [phone]; Fax: [phone]

## 2018-03-11 NOTE — Patient Instructions (Signed)
Medication Instructions:   Your physician recommends that you continue on your current medications as directed. Please refer to the Current Medication list given to you today.   If you need a refill on your cardiac medications before your next appointment, please call your pharmacy.  Labwork: NONE ORDERED  TODAY    Testing/Procedures: Your physician has requested that you have an echocardiogram. Echocardiography is a painless test that uses sound waves to create images of your heart. It provides your doctor with information about the size and shape of your heart and how well your heart's chambers and valves are working. This procedure takes approximately one hour. There are no restrictions for this procedure.     Follow-Up: DR HILTY IN 3 TO 4 MONTHS    Any Other Special Instructions Will Be Listed Below (If Applicable).

## 2018-03-13 ENCOUNTER — Encounter: Payer: Self-pay | Admitting: Physician Assistant

## 2018-03-20 ENCOUNTER — Other Ambulatory Visit: Payer: Self-pay

## 2018-03-20 ENCOUNTER — Ambulatory Visit (HOSPITAL_COMMUNITY): Payer: 59 | Attending: Cardiology

## 2018-03-20 ENCOUNTER — Encounter (INDEPENDENT_AMBULATORY_CARE_PROVIDER_SITE_OTHER): Payer: Self-pay

## 2018-03-20 DIAGNOSIS — I350 Nonrheumatic aortic (valve) stenosis: Secondary | ICD-10-CM | POA: Diagnosis not present

## 2018-03-20 DIAGNOSIS — I10 Essential (primary) hypertension: Secondary | ICD-10-CM | POA: Diagnosis not present

## 2018-03-20 DIAGNOSIS — R0609 Other forms of dyspnea: Secondary | ICD-10-CM | POA: Diagnosis not present

## 2018-03-20 DIAGNOSIS — I4891 Unspecified atrial fibrillation: Secondary | ICD-10-CM | POA: Insufficient documentation

## 2018-03-20 DIAGNOSIS — R0600 Dyspnea, unspecified: Secondary | ICD-10-CM

## 2018-03-20 MED ORDER — PERFLUTREN LIPID MICROSPHERE
1.0000 mL | INTRAVENOUS | Status: AC | PRN
  Administered 2018-03-20: 2 mL via INTRAVENOUS

## 2018-03-28 ENCOUNTER — Telehealth: Payer: Self-pay | Admitting: Physician Assistant

## 2018-03-28 NOTE — Telephone Encounter (Signed)
New message    Patient returning call for echo results  

## 2018-03-31 NOTE — Telephone Encounter (Signed)
Patient given test results and voiced understanding. He states he has been in afib for about 3 weeks and whats to know what he should do-wants to know if his Echo showed AFib. Prefers Dr Debara Pickett to review his ECHO and give his opinion.

## 2018-04-01 NOTE — Telephone Encounter (Signed)
Yes .. He appears to be in a-fib on the echo. Given moderate aortic stenosis, may be good to try and re-establish sinus rhythm. Will reach out to [facility] with this as well.  Dr. Lemmie Evens

## 2018-04-03 NOTE — Telephone Encounter (Signed)
I have called and discussed with David [name] regarding potential DCCV, however he wish to see if he can come out of afib by himself. I told him it is less likely. I recommend nursing visit in 4 weeks with repeat EKG to confirm afib before we discuss DCCV again. He has DOE and fatigue at baseline, but likely related to weight > 400lbs. Weight loss is imperative.

## 2018-04-03 NOTE — Telephone Encounter (Signed)
Informed patient that David [name] would personally give him a call around noon to discuss ECHO; patient thanked me for the call and voiced understanding.

## 2018-04-17 ENCOUNTER — Ambulatory Visit: Payer: 59 | Admitting: Internal Medicine

## 2018-06-11 ENCOUNTER — Ambulatory Visit: Payer: 59 | Admitting: Internal Medicine

## 2018-06-11 ENCOUNTER — Encounter: Payer: Self-pay | Admitting: Internal Medicine

## 2018-06-11 VITALS — BP 126/74 | HR 75 | Ht 76.0 in | Wt >= 6400 oz

## 2018-06-11 DIAGNOSIS — I4819 Other persistent atrial fibrillation: Secondary | ICD-10-CM

## 2018-06-11 DIAGNOSIS — G4733 Obstructive sleep apnea (adult) (pediatric): Secondary | ICD-10-CM | POA: Diagnosis not present

## 2018-06-11 DIAGNOSIS — I4510 Unspecified right bundle-branch block: Secondary | ICD-10-CM | POA: Diagnosis not present

## 2018-06-11 DIAGNOSIS — Z9989 Dependence on other enabling machines and devices: Secondary | ICD-10-CM

## 2018-06-11 DIAGNOSIS — I350 Nonrheumatic aortic (valve) stenosis: Secondary | ICD-10-CM
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
## 2018-06-11 NOTE — Patient Instructions (Addendum)
Medication Instructions:  Continue current medications If you need a refill on your cardiac medications before your next appointment, please call your pharmacy.   Lab work: Non-Fasting lab work prior to cardioversion (BMET, CBC)  Testing/Procedures: Your physician has recommended that you have a Cardioversion (DCCV). Electrical Cardioversion uses a jolt of electricity to your heart either through paddles or wired patches attached to your chest. This is a controlled, usually prescheduled, procedure. Defibrillation is done under light anesthesia in the hospital, and you usually go home the day of the procedure. This is done to get your heart back into a normal rhythm. You are not awake for the procedure. Please see the instruction sheet given to you today. -- this is done at [place]: At [facility], you and your health needs are our priority.  As part of our continuing mission to provide you with exceptional heart care, we have created designated Provider Care Teams.  These Care Teams include your primary Cardiologist (physician) and Advanced Practice Providers (APPs -  Physician Assistants and Nurse Practitioners) who all work together to provide you with the care you need, when you need it. You will need a follow up appointment in 3-4 weeks after cardioversion. You may see [name], MD or one of the following Advanced Practice Providers on your designated Care Team: [place], [place] . Fabian Sharp, PA-C  Any Other Special Instructions Will Be Listed Below (If Applicable).  Please call our office to schedule your cardioversion. You can ask for [facility] and if unavailable, a triage nurse can assist you.  You are scheduled for a Cardioversion on ____________ with Dr. Debara Pickett.  Please arrive at the [facility] (Main Entrance A) at [facility]: [address] [place], [place] [address] at _______ am/pm. (1 hour prior to procedure unless lab work is needed; if lab work is  needed arrive 1.5 hours ahead)  DIET: Nothing to eat or drink after midnight except a sip of water with medications (see medication instructions below)  Medication Instructions: Continue your anticoagulant: Xarelto You will need to continue your anticoagulant after your procedure until you are told by your provider that it is safe to stop   Labs: CBC & BMET prior to procedure  Come to: Dr. Lysbeth Penner office for labs  You must have a responsible person to drive you home and stay in the waiting area during your procedure. Failure to do so could result in cancellation.  Bring your insurance cards.  *Special Note: Every effort is made to have your procedure done on time. Occasionally there are emergencies that occur at the hospital that may cause delays. Please be patient if a delay does occur.

## 2018-06-13 DIAGNOSIS — H60332 Swimmer's ear, left ear: Secondary | ICD-10-CM | POA: Diagnosis not present

## 2018-06-13 DIAGNOSIS — H608X3 Other otitis externa, bilateral: Secondary | ICD-10-CM | POA: Diagnosis not present

## 2018-06-17 DIAGNOSIS — H60332 Swimmer's ear, left ear: Secondary | ICD-10-CM | POA: Diagnosis not present

## 2018-07-14 ENCOUNTER — Telehealth: Payer: Self-pay | Admitting: Internal Medicine

## 2018-07-14 NOTE — Telephone Encounter (Signed)
Patient seen 06/11/18 - cardioversion was discussed. Patient has not called in to advise whether he wishes to proceed or not. LMTCB to discuss.

## 2018-07-15 NOTE — Telephone Encounter (Signed)
I think he is busy with shows at the [place] now - thanks for reaching out to him  Dr. Lemmie Evens

## 2019-02-10 ENCOUNTER — Other Ambulatory Visit: Payer: Self-pay | Admitting: Family Medicine

## 2019-02-10 DIAGNOSIS — I480 Paroxysmal atrial fibrillation: Secondary | ICD-10-CM

## 2019-02-10 DIAGNOSIS — I10 Essential (primary) hypertension: Secondary | ICD-10-CM

## 2019-02-10 DIAGNOSIS — I63412 Cerebral infarction due to embolism of left middle cerebral artery: Secondary | ICD-10-CM

## 2019-02-10 NOTE — Telephone Encounter (Signed)
Called pt to advise appt is needed. No answer and voice mail was not set up. And will try again later. [place]

## 2019-02-24 ENCOUNTER — Ambulatory Visit: Payer: 59 | Admitting: Family Medicine

## 2019-02-24 ENCOUNTER — Other Ambulatory Visit: Payer: Self-pay

## 2019-02-24 ENCOUNTER — Encounter: Payer: Self-pay | Admitting: Family Medicine

## 2019-02-24 VITALS — BP 134/88 | HR 89 | Temp 97.7°F

## 2019-02-24 DIAGNOSIS — I480 Paroxysmal atrial fibrillation: Secondary | ICD-10-CM

## 2019-02-24 DIAGNOSIS — G4733 Obstructive sleep apnea (adult) (pediatric): Secondary | ICD-10-CM | POA: Diagnosis not present

## 2019-02-24 DIAGNOSIS — I350 Nonrheumatic aortic (valve) stenosis: Secondary | ICD-10-CM

## 2019-02-24 DIAGNOSIS — Z1211 Encounter for screening for malignant neoplasm of colon: Secondary | ICD-10-CM

## 2019-02-24 DIAGNOSIS — Z23 Encounter for immunization: Secondary | ICD-10-CM | POA: Diagnosis not present

## 2019-02-24 DIAGNOSIS — I63412 Cerebral infarction due to embolism of left middle cerebral artery: Secondary | ICD-10-CM

## 2019-02-24 DIAGNOSIS — Z800 Family history of malignant neoplasm of digestive organs: Secondary | ICD-10-CM

## 2019-02-24 DIAGNOSIS — E785 Hyperlipidemia, unspecified: Secondary | ICD-10-CM

## 2019-02-24 DIAGNOSIS — I10 Essential (primary) hypertension: Secondary | ICD-10-CM

## 2019-02-24 DIAGNOSIS — F419 Anxiety disorder, unspecified: Secondary | ICD-10-CM

## 2019-02-24 MED ORDER — RIVAROXABAN 20 MG PO TABS: ORAL_TABLET | ORAL | 3 refills | Status: DC

## 2019-02-24 MED ORDER — ALPRAZOLAM 1 MG PO TABS: 1.0000 mg | ORAL_TABLET | Freq: Every evening | ORAL | 1 refills | Status: DC | PRN

## 2019-02-24 MED ORDER — METOPROLOL SUCCINATE ER 50 MG PO TB24: ORAL_TABLET | ORAL | 3 refills | Status: DC

## 2019-02-24 MED ORDER — ATORVASTATIN CALCIUM 10 MG PO TABS: ORAL_TABLET | ORAL | 3 refills | Status: DC

## 2019-02-24 MED ORDER — DILTIAZEM HCL ER COATED BEADS 120 MG PO CP24: 120.0000 mg | ORAL_CAPSULE | Freq: Every day | ORAL | 3 refills | Status: DC

## 2019-02-24 MED ORDER — METHOCARBAMOL 500 MG PO TABS: 500.0000 mg | ORAL_TABLET | Freq: Four times a day (QID) | ORAL | 1 refills | Status: DC

## 2019-02-24 MED ORDER — VALSARTAN-HYDROCHLOROTHIAZIDE 160-25 MG PO TABS: 1.0000 | ORAL_TABLET | Freq: Every day | ORAL | 3 refills | Status: DC

## 2019-02-24 NOTE — Progress Notes (Signed)
   Subjective:    Patient ID: David [name], male    DOB: [birth year], 63 y.o.   MRN: [id number]  HPI He is here for med check appointment.  He continues in atrial fibrillation and is taking Xarelto.  He does have underlying OSA and is using CPAP with good results.  He has had a great deal difficulty with his weight and states that the stress that he is under is interfering with this.  He continues on his blood pressure medications and is having no difficulty with this.  He does have a family history of colon cancer but is not interested in a colonoscopy.   Review of Systems     Objective:   Physical Exam Alert and in no distress. Tympanic membranes and canals are normal. Pharyngeal area is normal. Neck is supple without adenopathy or thyromegaly. Cardiac exam shows a regular sinus rhythm without murmurs or gallops. Lungs are clear to auscultation.        Assessment & Plan:  PAF (paroxysmal atrial fibrillation) (HCC) - Plan: rivaroxaban (XARELTO) 20 MG TABS tablet, metoprolol succinate (TOPROL-XL) 50 MG 24 hr tablet, diltiazem (CARDIZEM CD) 120 MG 24 hr capsule,   Nonrheumatic aortic valve stenosis - Plan: Follow-up with cardiology  Essential hypertension - Plan: CBC with Differential/Platelet, Comprehensive metabolic panel, valsartan-hydrochlorothiazide (DIOVAN-HCT) 160-25 MG tablet, metoprolol succinate (TOPROL-XL) 50 MG 24 hr tablet, diltiazem (CARDIZEM CD) 120 MG 24 hr capsule,   OSA treated with BiPAP - Plan: Continue with CPAP  Morbid obesity due to excess calories (HCC) - Plan: atorvastatin (LIPITOR) 10 MG tablet,   Family history of colon cancer in father - Plan: He did not want a colonoscopy and I therefore ordered a Cologuard  Screening for colon cancer - Plan: Cologuard,   Need for vaccination against Streptococcus pneumoniae - Plan: Varicella-zoster vaccine IM (Shingrix),   Anxiety - Plan: ALPRAZolam (XANAX) 1 MG tablet, DISCONTINUED: ALPRAZolam (XANAX) 1 MG tablet,    Hyperlipidemia, unspecified hyperlipidemia type - Plan: Lipid panel,   Cerebrovascular accident (CVA) due to embolism of left middle cerebral artery ([place]) - Plan: atorvastatin (LIPITOR) 10 MG tablet,

## 2019-02-25 LAB — CBC WITH DIFFERENTIAL/PLATELET
Basophils Absolute: 0.1 10*3/uL (ref 0.0–0.2)
Basos: 1 %
EOS (ABSOLUTE): 0.3 10*3/uL (ref 0.0–0.4)
Eos: 3 %
Hematocrit: 44.7 % (ref 37.5–51.0)
Hemoglobin: 15.3 g/dL (ref 13.0–17.7)
Immature Grans (Abs): 0.1 10*3/uL (ref 0.0–0.1)
Immature Granulocytes: 1 %
Lymphocytes Absolute: 2.1 10*3/uL (ref 0.7–3.1)
Lymphs: 25 %
MCH: 30.8 pg (ref 26.6–33.0)
MCHC: 34.2 g/dL (ref 31.5–35.7)
MCV: 90 fL (ref 79–97)
Monocytes Absolute: 0.7 10*3/uL (ref 0.1–0.9)
Monocytes: 9 %
Neutrophils Absolute: 5.2 10*3/uL (ref 1.4–7.0)
Neutrophils: 61 %
Platelets: 239 10*3/uL (ref 150–450)
RBC: 4.96 x10E6/uL (ref 4.14–5.80)
RDW: 13.4 % (ref 11.6–15.4)
WBC: 8.4 10*3/uL (ref 3.4–10.8)

## 2019-02-25 LAB — COMPREHENSIVE METABOLIC PANEL
ALT: 25 IU/L (ref 0–44)
AST: 18 IU/L (ref 0–40)
Albumin/Globulin Ratio: 1.4 (ref 1.2–2.2)
Albumin: 4.3 g/dL (ref 3.8–4.8)
Alkaline Phosphatase: 95 IU/L (ref 39–117)
BUN/Creatinine Ratio: 14 (ref 10–24)
BUN: 16 mg/dL (ref 8–27)
Bilirubin Total: 0.8 mg/dL (ref 0.0–1.2)
CO2: 24 mmol/L (ref 20–29)
Calcium: 9.7 mg/dL (ref 8.6–10.2)
Chloride: 99 mmol/L (ref 96–106)
Creatinine, Ser: 1.15 mg/dL (ref 0.76–1.27)
GFR calc Af Amer: 78 mL/min/{1.73_m2} (ref >59)
GFR calc non Af Amer: 68 mL/min/{1.73_m2} (ref >59)
Globulin, Total: 3.1 g/dL (ref 1.5–4.5)
Glucose: 102 mg/dL — ABNORMAL HIGH (ref 65–99)
Potassium: 4.5 mmol/L (ref 3.5–5.2)
Sodium: 136 mmol/L (ref 134–144)
Total Protein: 7.4 g/dL (ref 6.0–8.5)

## 2019-02-25 LAB — LIPID PANEL
Chol/HDL Ratio: 4.3 ratio (ref 0.0–5.0)
Cholesterol, Total: 137 mg/dL (ref 100–199)
HDL: 32 mg/dL — ABNORMAL LOW (ref >39)
LDL Calculated: 83 mg/dL (ref 0–99)
Triglycerides: 110 mg/dL (ref 0–149)
VLDL Cholesterol Cal: 22 mg/dL (ref 5–40)

## 2019-02-26 LAB — HEPATITIS C ANTIBODY: Hep C Virus Ab: 0.2 s/co ratio (ref 0.0–0.9)

## 2019-02-26 LAB — SPECIMEN STATUS REPORT

## 2019-05-10 LAB — COLOGUARD: Cologuard: POSITIVE — AB

## 2019-05-12 DIAGNOSIS — Z1211 Encounter for screening for malignant neoplasm of colon: Secondary | ICD-10-CM

## 2019-05-12 DIAGNOSIS — R195 Other fecal abnormalities: Secondary | ICD-10-CM

## 2019-05-12 NOTE — Progress Notes (Signed)
Per [employer] he will call pt and referral is put in for GI . [place]

## 2019-05-30 ENCOUNTER — Other Ambulatory Visit: Payer: Self-pay | Admitting: Family Medicine

## 2019-05-30 DIAGNOSIS — I10 Essential (primary) hypertension: Secondary | ICD-10-CM

## 2019-06-29 ENCOUNTER — Other Ambulatory Visit: Payer: Self-pay

## 2019-06-29 ENCOUNTER — Encounter: Payer: Self-pay | Admitting: *Deleted

## 2019-06-29 ENCOUNTER — Other Ambulatory Visit (INDEPENDENT_AMBULATORY_CARE_PROVIDER_SITE_OTHER): Payer: 59

## 2019-06-29 DIAGNOSIS — Z23 Encounter for immunization: Secondary | ICD-10-CM

## 2019-07-22 ENCOUNTER — Other Ambulatory Visit: Payer: Self-pay | Admitting: Family Medicine

## 2019-07-22 NOTE — Telephone Encounter (Signed)
[facility] is requesting to fill pt robaxin. Please advise [facility] °

## 2019-10-01 ENCOUNTER — Other Ambulatory Visit: Payer: Self-pay | Admitting: Family Medicine

## 2019-10-01 DIAGNOSIS — F419 Anxiety disorder, unspecified: Secondary | ICD-10-CM

## 2019-10-01 NOTE — Telephone Encounter (Signed)
[facility] is requesting to fill pt xanax. Please advise [facility]

## 2019-12-01 ENCOUNTER — Other Ambulatory Visit: Payer: Self-pay | Admitting: Family Medicine

## 2019-12-02 NOTE — Telephone Encounter (Signed)
[facility] is requesting to fill pt robaxin. Please advise [facility] °

## 2020-01-31 ENCOUNTER — Other Ambulatory Visit: Payer: Self-pay | Admitting: Family Medicine

## 2020-01-31 DIAGNOSIS — F419 Anxiety disorder, unspecified: Secondary | ICD-10-CM

## 2020-02-01 NOTE — Telephone Encounter (Signed)
[facility] is requesting to fill pt xanax . Please advise [facility] 

## 2020-02-19 ENCOUNTER — Encounter: Payer: Self-pay | Admitting: Emergency Medicine

## 2020-02-19 ENCOUNTER — Ambulatory Visit
Admission: EM | Admit: 2020-02-19 | Discharge: 2020-02-19 | Disposition: A | Discharge status: Home / Self Care | Payer: 59 | Attending: Emergency Medicine | Admitting: Emergency Medicine

## 2020-02-19 ENCOUNTER — Ambulatory Visit (INDEPENDENT_AMBULATORY_CARE_PROVIDER_SITE_OTHER): Payer: 59

## 2020-02-19 ENCOUNTER — Other Ambulatory Visit: Payer: Self-pay

## 2020-02-19 DIAGNOSIS — W19XXXA Unspecified fall, initial encounter: Secondary | ICD-10-CM | POA: Diagnosis not present

## 2020-02-19 DIAGNOSIS — M25522 Pain in left elbow: Secondary | ICD-10-CM

## 2020-02-19 NOTE — ED Provider Notes (Signed)
[facility]    CSN: [id number] Arrival date & time: 02/19/20  1507      History   Chief Complaint Chief Complaint  Patient presents with  . Elbow Pain    HPI David [name] is a 64 y.o. male history of hypertension, sleep apnea, obesity presenting for left elbow pain, swelling, bruising s/p fall.  Tripped and fell backwards, caught himself with his left arm.  No head trauma, LOC.  Has noticed slight blistering to left elbow.  States he researched this online and is concerned there could be a fracture as these blisters could be "flexion blisters).  Endorsing full, painless ROM.  No neck pain, numbness or weakness.   Past Medical History:  Diagnosis Date  . Allergy    RHINITIS  . Arthritis    thumb  . GERD (gastroesophageal reflux disease)    "not really"  . History of echocardiogram 03/29/2009   EF 50-55%; mild concentric LVH;   . History of nuclear stress test 08/28/2007   moderate diaphragmatic attenuation; EKG neg for ischemia   . Hyperlipidemia   . Hypertension    takes Diovan and Toprol  . Obesity   . PAF (paroxysmal atrial fibrillation) (HCC)    associated with OSA  . Right bundle branch block   . Rosacea   . Shortness of breath    With activity  . Sleep apnea    CPAP  . Smoker   . Systolic murmur     Patient Active Problem List   Diagnosis Date Noted  . Anxiety 02/24/2019  . Family history of colon cancer in father 02/24/2019  . Persistent atrial fibrillation ([place]) 06/11/2018  . Venous insufficiency 08/21/2017  . Seborrheic dermatitis 05/14/2017  . Nasal valve collapse 04/17/2017  . Sensorineural hearing loss (SNHL), bilateral 04/17/2017  . Tinnitus of right ear 03/21/2017  . Cerebrovascular accident (CVA) due to embolism of left middle cerebral artery ([place]) 06/22/2016  . Aortic stenosis 10/05/2015  . Lower extremity edema   . Allergic rhinitis 05/08/2015  . Former smoker 05/08/2015  . Morbid obesity due to excess calories ([place]) 01/12/2015    . Symptomatic varicose veins of left lower extremity 07/06/2014  . PAF (paroxysmal atrial fibrillation) ([place]) 03/18/2013  . OSA treated with BiPAP 03/18/2013  . RBBB 03/18/2013  . HTN (hypertension) 03/18/2013  . Aortic aneurysm ([place]) 03/18/2013    Past Surgical History:  Procedure Laterality Date  . FINGER SURGERY     Reconstruction of Left thumb and L index finger.  Reconstruction   . KNEE ARTHROSCOPY    . SHOULDER CLOSED REDUCTION     Procedure: CLOSED REDUCTION SHOULDER;  Surgeon: Lawrence Santiago [facility];  Location: [place];  Service: Orthopedics;  Laterality: Right;  . SHOULDER OPEN ROTATOR CUFF REPAIR  08/11/2011   Procedure: ROTATOR CUFF REPAIR SHOULDER OPEN;  Surgeon: Willa Frater III;  Location: [place];  Service: Orthopedics;  Laterality: Right;  . TONSILLECTOMY     as a child       Home Medications    Prior to Admission medications   Medication Sig Start Date End Date Taking? Authorizing Provider  ALPRAZolam (XANAX) 1 MG tablet TAKE 1 TABLET(1 MG) BY MOUTH AT BEDTIME AS NEEDED FOR SLEEP 02/03/20   Denita Lung, MD  atorvastatin (LIPITOR) 10 MG tablet TAKE 1 TABLET BY MOUTH EVERY DAY AT 6 PM 02/24/19   Denita Lung, MD  diltiazem (CARDIZEM CD) 120 MG 24 hr capsule Take 1 capsule (120 mg total)  by mouth daily. 02/24/19   Denita Lung, MD  methocarbamol (ROBAXIN) 500 MG tablet TAKE 1 TABLET(500 MG) BY MOUTH FOUR TIMES DAILY 12/02/19   Denita Lung, MD  metoprolol succinate (TOPROL-XL) 50 MG 24 hr tablet Take with or immediately following a meal. 02/24/19   Denita Lung, MD  rivaroxaban (XARELTO) 20 MG TABS tablet TAKE 1 TABLET(20 MG) BY MOUTH DAILY WITH SUPPER 02/24/19   Denita Lung, MD  valsartan-hydrochlorothiazide (DIOVAN-HCT) 160-25 MG tablet TAKE 1 TABLET BY MOUTH DAILY 06/01/19   Denita Lung, MD    Family History Family History  Problem Relation Age of Onset  . Heart disease Mother   . Cancer - Prostate Maternal Grandfather   . Cancer - Prostate  Paternal Grandfather   . Cancer - Colon Father   . Heart attack Father 9  . Cancer Paternal Uncle   . Anesthesia problems Neg Hx   . Stroke Neg Hx     Social History Social History   Tobacco Use  . Smoking status: Former Smoker    Packs/day: 1.00    Years: 40.00    Pack years: 40.00    Types: Cigarettes    Quit date: 06/20/2016    Years since quitting: 3.6  . Smokeless tobacco: Never Used  Substance Use Topics  . Alcohol use: Yes    Alcohol/week: 11.0 standard drinks    Types: 6 Cans of beer, 5 Shots of liquor per week  . Drug use: No     Allergies   Codeine and Morphine and related   Review of Systems As per HPI   Physical Exam Triage Vital Signs ED Triage Vitals  Enc Vitals Group     BP      Pulse      Resp      Temp      Temp src      SpO2      Weight      Height      Head Circumference      Peak Flow      Pain Score      Pain Loc      Pain Edu?      Excl. in [place]?    No data found.  Updated Vital Signs BP 137/82 (BP Location: Left Arm)   Pulse 75   Temp 98.3 F (36.8 C) (Oral)   Resp 18   SpO2 94%   Visual Acuity Right Eye Distance:   Left Eye Distance:   Bilateral Distance:    Right Eye Near:   Left Eye Near:    Bilateral Near:     Physical Exam Constitutional:      General: He is not in acute distress. HENT:     Head: Normocephalic and atraumatic.  Eyes:     General: No scleral icterus.    Pupils: Pupils are equal, round, and reactive to light.  Cardiovascular:     Rate and Rhythm: Normal rate.  Pulmonary:     Effort: Pulmonary effort is normal. No respiratory distress.     Breath sounds: No wheezing.  Musculoskeletal:     Cervical back: Normal range of motion. No tenderness.     Comments: Left elbow with full active ROM.  Mildly edematous as compared to right, though exam limited second to habitus.  Patient does have significant tenderness diffusely throughout elbow.  Neurovascularly intact.  Left shoulder and wrist  unremarkable  Skin:    Coloration: Skin is not jaundiced  or pale.  Neurological:     Mental Status: He is alert and oriented to person, place, and time.      UC Treatments / Results  Labs (all labs ordered are listed, but only abnormal results are displayed) Labs Reviewed - No data to display  EKG   Radiology DG Elbow Complete Left  Result Date: 02/19/2020 CLINICAL DATA:  Fall, left elbow pain EXAM: LEFT ELBOW - COMPLETE 3+ VIEW COMPARISON:  None. FINDINGS: Soft tissue swelling about the left elbow. Advanced degenerative changes with joint space narrowing and spurring. No joint effusion. No acute bony abnormality. Specifically, no fracture, subluxation, or dislocation. IMPRESSION: Advanced degenerative changes.  No acute bony abnormality. Electronically Signed   By: Rolm Baptise M.D.   On: 02/19/2020 15:46    Procedures Procedures (including critical care time)  Medications Ordered in UC Medications - No data to display  Initial Impression / Assessment and Plan / UC Course  I have reviewed the triage vital signs and the nursing notes.  Pertinent labs & imaging results that were available during my care of the patient were reviewed by me and considered in my medical decision making (see chart for details).     X-ray done office, reviewed by me radiology: Soft tissue swelling noted around left elbow with advanced degenerative changes and joint space narrowing and spurring.  No effusion, acute bony abnormality such as fracture, subluxation, dislocation.  Reviewed findings with patient verbalized understanding.  Applied Ace wrap which patient tolerated well endorsing some pain relief.  Reviewed RICE protocol as outlined below.  Return precautions discussed, patient verbalized understanding and is agreeable to plan. Final Clinical Impressions(s) / UC Diagnoses   Final diagnoses:  Left elbow pain  Fall, initial encounter     Discharge Instructions     Recommend RICE: rest,  ice, compression, elevation as needed for pain.    Heat therapy (hot compress, warm wash rag, hot showers, etc.) can help relax muscles and soothe muscle aches. Cold therapy (ice packs) can be used to help swelling both after injury and after prolonged use of areas of chronic pain/aches.  For pain: recommend 350 mg-1000 mg of Tylenol (acetaminophen) and/or 200 mg - 800 mg of Advil (ibuprofen, Motrin) every 8 hours as needed.  May alternate between the two throughout the day as they are generally safe to take together.  DO NOT exceed more than 3000 mg of Tylenol or 3200 mg of ibuprofen in a 24 hour period as this could damage your stomach, kidneys, liver, or increase your bleeding risk.    ED Prescriptions    None     PDMP not reviewed this encounter.   Hall-Potvin, [place], [place] 02/20/20 [id number]

## 2020-02-19 NOTE — Discharge Instructions (Signed)

## 2020-02-19 NOTE — ED Triage Notes (Signed)
Pt presents to [facility] for assessment of left elbow pain after a trip and fall backwards catching himself with his left arm.  Patient states pain to left elbow, swelling and hardness immediately after.  C/o blisters to left arm now, concerned for "fracture blisters", has full ROM without increase in pain.

## 2020-04-04 ENCOUNTER — Other Ambulatory Visit: Payer: Self-pay | Admitting: Family Medicine

## 2020-04-04 DIAGNOSIS — I480 Paroxysmal atrial fibrillation: Secondary | ICD-10-CM

## 2020-04-26 ENCOUNTER — Encounter: Payer: Self-pay | Admitting: Family Medicine

## 2020-04-26 ENCOUNTER — Ambulatory Visit: Payer: 59 | Admitting: Family Medicine

## 2020-04-26 ENCOUNTER — Other Ambulatory Visit: Payer: Self-pay

## 2020-04-26 VITALS — BP 126/78 | HR 76 | Temp 97.6°F | Wt >= 6400 oz

## 2020-04-26 DIAGNOSIS — G4733 Obstructive sleep apnea (adult) (pediatric): Secondary | ICD-10-CM

## 2020-04-26 DIAGNOSIS — L309 Dermatitis, unspecified: Secondary | ICD-10-CM

## 2020-04-26 DIAGNOSIS — I10 Essential (primary) hypertension: Secondary | ICD-10-CM | POA: Diagnosis not present

## 2020-04-26 DIAGNOSIS — Z23 Encounter for immunization: Secondary | ICD-10-CM | POA: Diagnosis not present

## 2020-04-26 DIAGNOSIS — R195 Other fecal abnormalities: Secondary | ICD-10-CM

## 2020-04-26 DIAGNOSIS — I350 Nonrheumatic aortic (valve) stenosis: Secondary | ICD-10-CM

## 2020-04-26 DIAGNOSIS — I480 Paroxysmal atrial fibrillation: Secondary | ICD-10-CM

## 2020-04-26 DIAGNOSIS — F419 Anxiety disorder, unspecified: Secondary | ICD-10-CM

## 2020-04-26 DIAGNOSIS — I63412 Cerebral infarction due to embolism of left middle cerebral artery: Secondary | ICD-10-CM

## 2020-04-26 MED ORDER — VALSARTAN-HYDROCHLOROTHIAZIDE 160-25 MG PO TABS: 1.0000 | ORAL_TABLET | Freq: Every day | ORAL | 3 refills | Status: DC

## 2020-04-26 MED ORDER — RIVAROXABAN 20 MG PO TABS: ORAL_TABLET | ORAL | 3 refills | Status: DC

## 2020-04-26 MED ORDER — DILTIAZEM HCL ER COATED BEADS 120 MG PO CP24: 120.0000 mg | ORAL_CAPSULE | Freq: Every day | ORAL | 3 refills | Status: DC

## 2020-04-26 MED ORDER — ATORVASTATIN CALCIUM 10 MG PO TABS: ORAL_TABLET | ORAL | 3 refills | Status: DC

## 2020-04-26 MED ORDER — METOPROLOL SUCCINATE ER 50 MG PO TB24: ORAL_TABLET | ORAL | 3 refills | Status: DC

## 2020-04-26 MED ORDER — HYDROCORTISONE 2.5 % EX CREA: TOPICAL_CREAM | CUTANEOUS | 5 refills | Status: DC

## 2020-04-26 NOTE — Progress Notes (Signed)
   Subjective:    Patient ID: David [name], male    DOB: 1956-08-05, 64 y.o.   MRN: [id number]  HPI He is here for a med check appointment.  Has not been here since July 2020.  He does have a history of PAF but has not had follow-up with cardiology concerning that or aortic stenosis.  He does have OSA and would like a new machine.  He apparently has had this 1 for over 5 years.  He does have a previous history of CVA but has not had any weakness, numbness, tingling in quite some time.  He also has a history of positive Cologuard but did not follow-up with gastroenterology concerning this.  He is not working again and has decided to make some positive changes in regard to diet and exercise.  He recognizes the fact that he cannot walk very far without becoming quite short of breath.  This has him quite upset.  He continues on Xarelto, valsartan/HCTZ, metoprolol, Cardizem.  He does occasionally use Xanax.  Continues on atorvastatin without difficulty.   Review of Systems     Objective:   Physical Exam  Alert and in no distress. Tympanic membranes and canals are normal.  The left external canal is slightly erythematous pharyngeal area is normal. Neck is supple without adenopathy or thyromegaly. Cardiac exam shows a regular sinus rhythm without murmurs or gallops. Lungs are clear to auscultation.       Assessment & Plan:  Essential hypertension  Nonrheumatic aortic valve stenosis - Plan: Ambulatory referral to Cardiology  PAF (paroxysmal atrial fibrillation) ([place]) - Plan: Ambulatory referral to Cardiology  OSA treated with BiPAP  Cerebrovascular accident (CVA) due to embolism of left middle cerebral artery ([place])  Anxiety  Morbid obesity due to excess calories ([place]) - Plan: CBC with Differential/Platelet, Comprehensive metabolic panel, Lipid panel  Positive colorectal cancer screening using Cologuard test - Plan: Ambulatory referral to Gastroenterology  Need for influenza vaccination -  Plan: Flu Vaccine QUAD 36+ mos IM I had a long discussion with him concerning diet and exercise.  Recommend that he slowly increase his physical activity as he is obviously deconditioned.  He even mentioned moving in 1 minute increments until he gets his strength up.  His medications were reviewed.  Although he is in regular rhythm at this time, referral back to cardiology I think is appropriate.  He did express concerns over being put to sleep and not waking up.  I encouraged him to discuss this with cardiology as well as gastroenterology.

## 2020-04-27 LAB — CBC WITH DIFFERENTIAL/PLATELET
Basophils Absolute: 0.1 10*3/uL (ref 0.0–0.2)
Basos: 1 %
EOS (ABSOLUTE): 0.3 10*3/uL (ref 0.0–0.4)
Eos: 4 %
Hematocrit: 46.4 % (ref 37.5–51.0)
Hemoglobin: 15.7 g/dL (ref 13.0–17.7)
Immature Grans (Abs): 0 10*3/uL (ref 0.0–0.1)
Immature Granulocytes: 0 %
Lymphocytes Absolute: 2.2 10*3/uL (ref 0.7–3.1)
Lymphs: 25 %
MCH: 31.3 pg (ref 26.6–33.0)
MCHC: 33.8 g/dL (ref 31.5–35.7)
MCV: 93 fL (ref 79–97)
Monocytes Absolute: 0.6 10*3/uL (ref 0.1–0.9)
Monocytes: 7 %
Neutrophils Absolute: 5.5 10*3/uL (ref 1.4–7.0)
Neutrophils: 63 %
Platelets: 221 10*3/uL (ref 150–450)
RBC: 5.01 x10E6/uL (ref 4.14–5.80)
RDW: 14.1 % (ref 11.6–15.4)
WBC: 8.7 10*3/uL (ref 3.4–10.8)

## 2020-04-27 LAB — LIPID PANEL
Chol/HDL Ratio: 4.5 ratio (ref 0.0–5.0)
Cholesterol, Total: 140 mg/dL (ref 100–199)
HDL: 31 mg/dL — ABNORMAL LOW (ref >39)
LDL Chol Calc (NIH): 91 mg/dL (ref 0–99)
Triglycerides: 94 mg/dL (ref 0–149)
VLDL Cholesterol Cal: 18 mg/dL (ref 5–40)

## 2020-04-27 LAB — COMPREHENSIVE METABOLIC PANEL
ALT: 24 IU/L (ref 0–44)
AST: 19 IU/L (ref 0–40)
Albumin/Globulin Ratio: 1.2 (ref 1.2–2.2)
Albumin: 4.5 g/dL (ref 3.8–4.8)
Alkaline Phosphatase: 99 IU/L (ref 48–121)
BUN/Creatinine Ratio: 15 (ref 10–24)
BUN: 17 mg/dL (ref 8–27)
Bilirubin Total: 1 mg/dL (ref 0.0–1.2)
CO2: 22 mmol/L (ref 20–29)
Calcium: 9.8 mg/dL (ref 8.6–10.2)
Chloride: 100 mmol/L (ref 96–106)
Creatinine, Ser: 1.13 mg/dL (ref 0.76–1.27)
GFR calc Af Amer: 80 mL/min/{1.73_m2} (ref >59)
GFR calc non Af Amer: 69 mL/min/{1.73_m2} (ref >59)
Globulin, Total: 3.7 g/dL (ref 1.5–4.5)
Glucose: 95 mg/dL (ref 65–99)
Potassium: 4.1 mmol/L (ref 3.5–5.2)
Sodium: 136 mmol/L (ref 134–144)
Total Protein: 8.2 g/dL (ref 6.0–8.5)

## 2020-04-28 ENCOUNTER — Telehealth: Payer: Self-pay | Admitting: Family Medicine

## 2020-04-28 NOTE — Telephone Encounter (Signed)
Pt called and states that he got the flu shot on Tuesday and states he is having chills sore throat, and fatigue , and at one point he felt like he was going to pass out, he does not no if this is coming from the shot or if he needs to come in to be tested for the COVID pt can be reached at [phone]

## 2020-04-28 NOTE — Telephone Encounter (Signed)
Done it coming in tomorrow for void test in am and doing virtual tomorrow afternoon

## 2020-04-28 NOTE — Telephone Encounter (Signed)
Set him up for a virtual visit and Covid test for tomorrow the shot should not cause a sore throat so we should make sure he does not have Covid

## 2020-04-29 ENCOUNTER — Encounter: Payer: Self-pay | Admitting: Family Medicine

## 2020-04-29 ENCOUNTER — Other Ambulatory Visit: Payer: Self-pay

## 2020-04-29 ENCOUNTER — Other Ambulatory Visit (INDEPENDENT_AMBULATORY_CARE_PROVIDER_SITE_OTHER): Payer: 59

## 2020-04-29 ENCOUNTER — Telehealth: Payer: 59 | Admitting: Family Medicine

## 2020-04-29 VITALS — BP 100/64 | HR 83 | Temp 97.0°F | Wt >= 6400 oz

## 2020-04-29 DIAGNOSIS — R6883 Chills (without fever): Secondary | ICD-10-CM

## 2020-04-29 DIAGNOSIS — L03116 Cellulitis of left lower limb: Secondary | ICD-10-CM | POA: Diagnosis not present

## 2020-04-29 DIAGNOSIS — R3989 Other symptoms and signs involving the genitourinary system: Secondary | ICD-10-CM | POA: Diagnosis not present

## 2020-04-29 DIAGNOSIS — J029 Acute pharyngitis, unspecified: Secondary | ICD-10-CM

## 2020-04-29 LAB — POCT URINALYSIS DIP (PROADVANTAGE DEVICE)
Glucose, UA: NEGATIVE mg/dL
Nitrite, UA: NEGATIVE
Protein Ur, POC: 30 mg/dL — AB
Specific Gravity, Urine: 1.03
Urobilinogen, Ur: NEGATIVE
pH, UA: 6 (ref 5.0–8.0)

## 2020-04-29 LAB — POCT INFLUENZA A/B
Influenza A, POC: NEGATIVE
Influenza B, POC: NEGATIVE

## 2020-04-29 LAB — POC COVID19 BINAXNOW: SARS Coronavirus 2 Ag: NEGATIVE

## 2020-04-29 MED ORDER — DOXYCYCLINE HYCLATE 100 MG PO TABS: 100.0000 mg | ORAL_TABLET | Freq: Two times a day (BID) | ORAL | 0 refills | Status: DC

## 2020-04-29 NOTE — Progress Notes (Addendum)
   Subjective:    Patient ID: David [name], male    DOB: November 16, 1955, 64 y.o.   MRN: [id number]  HPI He was originally scheduled as a virtual visit but after talking to him I decided to have him come in.  He had a sudden onset of chills headache, myalgias, shortness of breath.  He stated that he tried to text a friend and had difficulty using his cell phone and texting somebody that lasted several hours.  He does have a previous history of TIA.  He then mentioned the fact that his left calf is red and swollen but he is having no chest pain or shortness of breath. He wanted his urine checked because it was quite dark. Review of Systems     Objective:   Physical Exam Covid done earlier today is negative Alert and in no distress. Tympanic membranes and canals are normal. Pharyngeal area is normal. Neck is supple without adenopathy or thyromegaly. Cardiac exam shows a regular sinus rhythm with 2/6 SEM,no gallops. Lungs are clear to auscultation. Urine specific gravity was 1.030.    Assessment & Plan:  Cellulitis of left lower extremity - Plan: doxycycline (VIBRA-TABS) 100 MG tablet  Abnormal urine color - Plan: POCT Urinalysis DIP (Proadvantage Device) I explained that the urine culture was because of dehydration and recommend he keep himself hydrated. He is to keep his foot elevated, use warm compresses 2 or 3 times per day.  If his symptoms get worse I explained that he would need to go to the hospital but will try to avoid that due to Covid issues. The PCR was ordered which should come in over the weekend but his symptoms are not necessarily that.  Some of the CNS symptoms do sound like they could be a TIA however he presently is on Xarelto. Return Tuesday

## 2020-05-02 LAB — NOVEL CORONAVIRUS, NAA: SARS-CoV-2, NAA: NOT DETECTED

## 2020-05-03 ENCOUNTER — Encounter: Payer: Self-pay | Admitting: Family Medicine

## 2020-05-03 ENCOUNTER — Other Ambulatory Visit: Payer: Self-pay

## 2020-05-03 ENCOUNTER — Ambulatory Visit: Payer: 59 | Admitting: Family Medicine

## 2020-05-03 VITALS — BP 124/80 | HR 94 | Temp 98.4°F | Wt >= 6400 oz

## 2020-05-03 DIAGNOSIS — L03116 Cellulitis of left lower limb: Secondary | ICD-10-CM

## 2020-05-03 NOTE — Progress Notes (Signed)
   Subjective:    Patient ID: David [name], male    DOB: 1956/08/05, 64 y.o.   MRN: [id number]  HPI He is here for a recheck. He had continued difficulty until apparently last night he did turn the corner. He notes now that there is less swelling and erythema proximal to the knee and to a lesser extent the swelling in the calf area.   Review of Systems     Objective:   Physical Exam Exam of the left lower extremity does show extensive swelling and to a certain center and slightly less erythema. It is no longer uncomfortable to touch and feels less warm.       Assessment & Plan:  Cellulitis of left lower extremity This is encouraging and hopefully will continue to improve. If it takes a turn for the worse, we will be forced to send him to the hospital. Otherwise he is to call me in 2 weeks. Explained that he will probably need to be on this antibiotic at least a month if not longer.

## 2020-05-05 ENCOUNTER — Other Ambulatory Visit: Payer: Self-pay

## 2020-05-05 ENCOUNTER — Encounter (HOSPITAL_COMMUNITY): Payer: Self-pay

## 2020-05-05 DIAGNOSIS — I10 Essential (primary) hypertension: Secondary | ICD-10-CM | POA: Diagnosis not present

## 2020-05-05 DIAGNOSIS — R319 Hematuria, unspecified: Secondary | ICD-10-CM | POA: Insufficient documentation

## 2020-05-05 DIAGNOSIS — R410 Disorientation, unspecified: Secondary | ICD-10-CM | POA: Insufficient documentation

## 2020-05-05 DIAGNOSIS — L539 Erythematous condition, unspecified: Secondary | ICD-10-CM | POA: Insufficient documentation

## 2020-05-05 DIAGNOSIS — Z79899 Other long term (current) drug therapy: Secondary | ICD-10-CM | POA: Insufficient documentation

## 2020-05-05 DIAGNOSIS — R6883 Chills (without fever): Secondary | ICD-10-CM | POA: Diagnosis not present

## 2020-05-05 DIAGNOSIS — L03116 Cellulitis of left lower limb: Secondary | ICD-10-CM | POA: Diagnosis not present

## 2020-05-05 DIAGNOSIS — R2242 Localized swelling, mass and lump, left lower limb: Secondary | ICD-10-CM | POA: Diagnosis present

## 2020-05-05 DIAGNOSIS — Z87891 Personal history of nicotine dependence: Secondary | ICD-10-CM | POA: Insufficient documentation

## 2020-05-05 DIAGNOSIS — Z8673 Personal history of transient ischemic attack (TIA), and cerebral infarction without residual deficits: Secondary | ICD-10-CM | POA: Insufficient documentation

## 2020-05-05 DIAGNOSIS — Z7901 Long term (current) use of anticoagulants: Secondary | ICD-10-CM | POA: Diagnosis not present

## 2020-05-05 LAB — CBC WITH DIFFERENTIAL/PLATELET
Abs Immature Granulocytes: 0.48 10*3/uL — ABNORMAL HIGH (ref 0.00–0.07)
Basophils Absolute: 0.1 10*3/uL (ref 0.0–0.1)
Basophils Relative: 1 %
Eosinophils Absolute: 0.3 10*3/uL (ref 0.0–0.5)
Eosinophils Relative: 3 %
HCT: 38.9 % — ABNORMAL LOW (ref 39.0–52.0)
Hemoglobin: 12.9 g/dL — ABNORMAL LOW (ref 13.0–17.0)
Immature Granulocytes: 5 %
Lymphocytes Relative: 15 %
Lymphs Abs: 1.5 10*3/uL (ref 0.7–4.0)
MCH: 31.1 pg (ref 26.0–34.0)
MCHC: 33.2 g/dL (ref 30.0–36.0)
MCV: 93.7 fL (ref 80.0–100.0)
Monocytes Absolute: 0.6 10*3/uL (ref 0.1–1.0)
Monocytes Relative: 6 %
Neutro Abs: 7 10*3/uL (ref 1.7–7.7)
Neutrophils Relative %: 70 %
Platelets: 304 10*3/uL (ref 150–400)
RBC: 4.15 MIL/uL — ABNORMAL LOW (ref 4.22–5.81)
RDW: 14.1 % (ref 11.5–15.5)
WBC: 10.1 10*3/uL (ref 4.0–10.5)
nRBC: 0 % (ref 0.0–0.2)

## 2020-05-05 LAB — COMPREHENSIVE METABOLIC PANEL
ALT: 52 U/L — ABNORMAL HIGH (ref 0–44)
AST: 30 U/L (ref 15–41)
Albumin: 3.2 g/dL — ABNORMAL LOW (ref 3.5–5.0)
Alkaline Phosphatase: 97 U/L (ref 38–126)
Anion gap: 14 (ref 5–15)
BUN: 27 mg/dL — ABNORMAL HIGH (ref 8–23)
CO2: 21 mmol/L — ABNORMAL LOW (ref 22–32)
Calcium: 9.3 mg/dL (ref 8.9–10.3)
Chloride: 102 mmol/L (ref 98–111)
Creatinine, Ser: 1.13 mg/dL (ref 0.61–1.24)
GFR calc Af Amer: >60 mL/min (ref >60)
GFR calc non Af Amer: >60 mL/min (ref >60)
Glucose, Bld: 129 mg/dL — ABNORMAL HIGH (ref 70–99)
Potassium: 3.9 mmol/L (ref 3.5–5.1)
Sodium: 137 mmol/L (ref 135–145)
Total Bilirubin: 1.5 mg/dL — ABNORMAL HIGH (ref 0.3–1.2)
Total Protein: 7.8 g/dL (ref 6.5–8.1)

## 2020-05-05 LAB — URINALYSIS, ROUTINE W REFLEX MICROSCOPIC
Bacteria, UA: NONE SEEN
Bilirubin Urine: NEGATIVE
Glucose, UA: NEGATIVE mg/dL
Ketones, ur: NEGATIVE mg/dL
Leukocytes,Ua: NEGATIVE
Nitrite: NEGATIVE
Protein, ur: NEGATIVE mg/dL
Specific Gravity, Urine: 1.02 (ref 1.005–1.030)
pH: 5 (ref 5.0–8.0)

## 2020-05-05 LAB — LACTIC ACID, PLASMA: Lactic Acid, Venous: 1.5 mmol/L (ref 0.5–1.9)

## 2020-05-05 NOTE — ED Notes (Signed)
1 set of blood cultures sent to lab.  

## 2020-05-05 NOTE — ED Triage Notes (Signed)
Patient c/o left lower extremities redness and swelling. Patient states he has been on doxycycline, but not getting any better,

## 2020-05-06 ENCOUNTER — Emergency Department (HOSPITAL_BASED_OUTPATIENT_CLINIC_OR_DEPARTMENT_OTHER): Payer: 59

## 2020-05-06 ENCOUNTER — Emergency Department (HOSPITAL_COMMUNITY): Payer: 59

## 2020-05-06 ENCOUNTER — Emergency Department (HOSPITAL_COMMUNITY)
Admission: EM | Admit: 2020-05-06 | Discharge: 2020-05-06 | Disposition: A | Discharge status: Home / Self Care | Payer: 59 | Attending: Emergency Medicine | Admitting: Emergency Medicine

## 2020-05-06 DIAGNOSIS — L0390 Cellulitis, unspecified: Secondary | ICD-10-CM

## 2020-05-06 DIAGNOSIS — M7989 Other specified soft tissue disorders: Secondary | ICD-10-CM | POA: Diagnosis not present

## 2020-05-06 DIAGNOSIS — R609 Edema, unspecified: Secondary | ICD-10-CM

## 2020-05-06 DIAGNOSIS — R319 Hematuria, unspecified: Secondary | ICD-10-CM

## 2020-05-06 DIAGNOSIS — L03116 Cellulitis of left lower limb: Secondary | ICD-10-CM

## 2020-05-06 LAB — BRAIN NATRIURETIC PEPTIDE: B Natriuretic Peptide: 157 pg/mL — ABNORMAL HIGH (ref 0.0–100.0)

## 2020-05-06 MED ORDER — HYDROMORPHONE HCL 1 MG/ML IJ SOLN
0.5000 mg | Freq: Once | INTRAMUSCULAR | Status: AC
  Administered 2020-05-06: 0.5 mg via INTRAVENOUS
  Filled 2020-05-06: qty 1

## 2020-05-06 MED ORDER — KETOROLAC TROMETHAMINE 60 MG/2ML IM SOLN
60.0000 mg | Freq: Once | INTRAMUSCULAR | Status: AC
  Administered 2020-05-06: 60 mg via INTRAMUSCULAR
  Filled 2020-05-06: qty 2

## 2020-05-06 MED ORDER — IOHEXOL 350 MG/ML SOLN
75.0000 mL | Freq: Once | INTRAVENOUS | Status: AC | PRN
  Administered 2020-05-06: 75 mL via INTRAVENOUS

## 2020-05-06 MED ORDER — OXYCODONE HCL 5 MG PO TABS: 5.0000 mg | ORAL_TABLET | ORAL | 0 refills | Status: AC | PRN

## 2020-05-06 MED ORDER — DEXTROSE 5 % IV SOLN
1500.0000 mg | Freq: Once | INTRAVENOUS | Status: AC
  Administered 2020-05-06: 1500 mg via INTRAVENOUS
  Filled 2020-05-06: qty 75

## 2020-05-06 NOTE — ED Provider Notes (Signed)
Care of the patient was assumed from Jamey Ripa PA-C at 330; see this providers note for complete history of present illness, review of systems, and physical exam.  Briefly, the patient is a 64 y.o. male who presented to the ED with left leg swelling, was put on Doxy but swelling had increased.  Work-up with signs of cellulitis, DVT study negative.  Previous provider discussed  hospital admission for IV antibiotics, patient opted to do one-time administration of dalbavancin.  Additionally patient also had TIA symptoms a week ago including confusion for 4 hours.  Previous providers neuro exam was negative, she spoke to Dr. Leonel Ramsay, neurology about this.  CTA of the head and neck and MRI brain without contrast did not show any acute abnormalities, patient is currently on Xarelto.  At time of handoff waiting for dalbavancin infusion to end.  Approximately 30 minutes.  Patient's pain has been controlled with Dilaudid.   420 Dalbavancin was completed, patient to be discharged.  ID ambulatory referral placed for patient.  I have given explicit precautions to return to the ER including for any other new or worsening symptoms. The patient understands and accepts the medical plan as it's been dictated and I have answered their questions. Discharge instructions concerning home care and prescriptions have been given. The patient is STABLE and is discharged to home in good condition.     Alfredia Client, PA-C 05/06/20 1621    Drenda Freeze, MD 05/06/20 2322

## 2020-05-06 NOTE — Progress Notes (Signed)
Lower extremity venous has been completed.   Preliminary results in CV Proc.   Abram Sander 05/06/2020 8:30 AM

## 2020-05-06 NOTE — Progress Notes (Signed)
Pharmacy Note:  Dalbavancin for Acute Bacterial Skin and Skin Structure Infection (ABSSSI) Patients to [facility] Discharge David [name] is an 64 y.o. male who presented to [facility] on 05/06/2020 with an Acute Bacterial Skin and Skin Structure Infection  Inclusion criteria - Indication []  Moderately large skin lesion (>=75 cm2 or larger - about the size of a baseball) [x]  Cellulitis  Inclusion Criteria - at least one SIRS criteria present []  WBC > 12,000 or < 4000 []  temp >100.9 or < 96.8 [x]  heart rate >90[]  respiratory rate >20  Patient was evaluated for the following exclusion criteria and no exclusions were found  Hardware involvement, Hypotension / shock, Elevated lactate (>2) without other explanation, ram-negative infection risk factors (bites, water exposure, infection after trauma, infection after skin graft, neutropenia, burns, severe immunocompromise), necrotizing fasciitis possible or confirmed, Known or suspected osteomyelitis or septic arthritis, endocarditis, diabetic foot infection, ischemic ulcers, post-operative wound infection, perirectal infections, need for drainage in the operating room, hand or facial infections, injection drug users with a fever, bacteremia, pregnancy or breastfeeding, allergy to related antibiotics like vancomycin, known liver disease (t.bili >2x ULN or AST/ALT 3x ULN)  Jimmy Footman, PharmD, BCPS, BCIDP Infectious Diseases Clinical Pharmacist Phone: [phone] 05/06/2020, 2:04 PM Clinical Pharmacist

## 2020-05-06 NOTE — Discharge Instructions (Addendum)
Your work-up today was negative for new TIA.  You were treated with dalbavancin which is an antibiotic used for cellulitis for your left leg.  You may take the prescribed oxycodone as directed for pain control.  Do not take more than the prescribed amount.  Please make sure to follow-up with the infectious disease doctors who will be reaching out to you.  Your liver function test and gallbladder test was elevated a little bit today. This could be due to dehydration and malnourishment. Your urine also showed a small amount blood in it. Please follow-up with your primary care doctor and have these rechecked. Return to the ER for any new or worsening symptoms.

## 2020-05-06 NOTE — ED Provider Notes (Signed)
[facility] DEPT Provider Note   CSN: [id number] Arrival date & time: 05/05/20  1435     History Chief Complaint  Patient presents with  . Leg Swelling    David [name] is a 64 y.o. male.  HPI 64 year old male with a history of obesity, PAF, TIA in 2017 on Xarelto, hypertension, OSA, venous insufficiency presents to the ER with complaints of redness and swelling to his left leg which began approximately 1 week ago.  Patient states that he has been having progressing erythema, warmth and swelling to his left lower extremity, seen by his PCP on 9/14 and started on doxycycline.  Patient states he has been compliant with the medications, however symptoms were not improving, and the redness seems to be spreading up his leg, which is now above his knee just below his groin.  He endorses significant 10/10 pain, states he is having significant difficulty ambulating.Marland Kitchen  He denies any fevers, states that he did have some chills approximately a week ago after receiving a flu vaccine.  He is vaccinated for Covid.  He was tested by his PCP for Covid last week and was negative.  He also stated that last Thursday, he was at home and on his iPad, tapping up a message, when he began to stare at it and could not figure out where the send button was.  He stated that his confusion lasted about 4 hours and then subsided with no residual side effects.  He states that he has a history of TIA before and states that he thinks he had another episode.  He is compliant with his Xarelto.  He denies any IV drug use, no recent injuries or falls.  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  He denies any chest pain or shortness of breath at this time.    Past Medical History:  Diagnosis Date  . Allergy    RHINITIS  . Arthritis    thumb  . GERD (gastroesophageal reflux disease)    "not really"  . History of echocardiogram 03/29/2009   EF 50-55%; mild concentric LVH;   . History  of nuclear stress test 08/28/2007   moderate diaphragmatic attenuation; EKG neg for ischemia   . Hyperlipidemia   . Hypertension    takes Diovan and Toprol  . Obesity   . PAF (paroxysmal atrial fibrillation) (HCC)    associated with OSA  . Right bundle branch block   . Rosacea   . Shortness of breath    With activity  . Sleep apnea    CPAP  . Smoker   . Systolic murmur     Patient Active Problem List   Diagnosis Date Noted  . Cellulitis 05/06/2020  . Anxiety 02/24/2019  . Family history of colon cancer in father 02/24/2019  . Venous insufficiency 08/21/2017  . Seborrheic dermatitis 05/14/2017  . Nasal valve collapse 04/17/2017  . Sensorineural hearing loss (SNHL), bilateral 04/17/2017  . Tinnitus of right ear 03/21/2017  . Cerebrovascular accident (CVA) due to embolism of left middle cerebral artery ([place]) 06/22/2016  . Aortic stenosis 10/05/2015  . Lower extremity edema   . Allergic rhinitis 05/08/2015  . Former smoker 05/08/2015  . Morbid obesity due to excess calories ([name]) 01/12/2015  . Symptomatic varicose veins of left lower extremity 07/06/2014  . PAF (paroxysmal atrial fibrillation) ([place]) 03/18/2013  . OSA treated with BiPAP 03/18/2013  . RBBB 03/18/2013  . HTN (hypertension) 03/18/2013  . Aortic aneurysm ([place]) 03/18/2013  Past Surgical History:  Procedure Laterality Date  . FINGER SURGERY     Reconstruction of Left thumb and L index finger.  Reconstruction   . KNEE ARTHROSCOPY    . SHOULDER CLOSED REDUCTION     Procedure: CLOSED REDUCTION SHOULDER;  Surgeon: [facility];  Location: [place];  Service: Orthopedics;  Laterality: Right;  . SHOULDER OPEN ROTATOR CUFF REPAIR  08/11/2011   Procedure: ROTATOR CUFF REPAIR SHOULDER OPEN;  Surgeon: Willa Frater III;  Location: [place];  Service: Orthopedics;  Laterality: Right;  . TONSILLECTOMY     as a child       Family History  Problem Relation Age of Onset  . Heart disease Mother   . Cancer -  Prostate Maternal Grandfather   . Cancer - Prostate Paternal Grandfather   . Cancer - Colon Father   . Heart attack Father 89  . Cancer Paternal Uncle   . Anesthesia problems Neg Hx   . Stroke Neg Hx     Social History   Tobacco Use  . Smoking status: Former Smoker    Packs/day: 1.00    Years: 40.00    Pack years: 40.00    Types: Cigarettes    Quit date: 06/20/2016    Years since quitting: 3.8  . Smokeless tobacco: Never Used  Vaping Use  . Vaping Use: Never used  Substance Use Topics  . Alcohol use: Yes    Alcohol/week: 11.0 standard drinks    Types: 6 Cans of beer, 5 Shots of liquor per week  . Drug use: No    Home Medications Prior to Admission medications   Medication Sig Start Date End Date Taking? Authorizing Provider  ALPRAZolam (XANAX) 1 MG tablet TAKE 1 TABLET(1 MG) BY MOUTH AT BEDTIME AS NEEDED FOR SLEEP Patient taking differently: Take 1 mg by mouth at bedtime as needed for anxiety or sleep.  02/03/20  Yes Denita Lung, MD  atorvastatin (LIPITOR) 10 MG tablet TAKE 1 TABLET BY MOUTH EVERY DAY AT 6 PM Patient taking differently: Take 10 mg by mouth daily.  04/26/20  Yes Denita Lung, MD  diltiazem (CARDIZEM CD) 120 MG 24 hr capsule Take 1 capsule (120 mg total) by mouth daily. 04/26/20  Yes Denita Lung, MD  doxycycline (VIBRA-TABS) 100 MG tablet Take 1 tablet (100 mg total) by mouth 2 (two) times daily. 04/29/20  Yes Denita Lung, MD  hydrocortisone 2.5 % cream Apply to external ears as needed for itching. 04/26/20  Yes Denita Lung, MD  metoprolol succinate (TOPROL-XL) 50 MG 24 hr tablet Take with or immediately following a meal. Patient taking differently: Take 50 mg by mouth daily. Take with or immediately following a meal. 04/26/20  Yes Denita Lung, MD  rivaroxaban (XARELTO) 20 MG TABS tablet TAKE 1 TABLET(20 MG) BY MOUTH DAILY WITH SUPPER Patient taking differently: Take 20 mg by mouth daily with supper.  04/26/20  Yes Denita Lung, MD   valsartan-hydrochlorothiazide (DIOVAN-HCT) 160-25 MG tablet Take 1 tablet by mouth daily. 04/26/20  Yes Denita Lung, MD  methocarbamol (ROBAXIN) 500 MG tablet TAKE 1 TABLET(500 MG) BY MOUTH FOUR TIMES DAILY Patient taking differently: Take 500 mg by mouth 4 (four) times daily as needed for muscle spasms.  12/02/19   Denita Lung, MD  oxyCODONE (ROXICODONE) 5 MG immediate release tablet Take 1 tablet (5 mg total) by mouth every 4 (four) hours as needed for up to 5 days for severe pain.  05/06/20 05/11/20  Garald Balding, PA-C    Allergies    Codeine and Morphine and related  Review of Systems   Review of Systems  Constitutional: Positive for chills. Negative for fever.  HENT: Negative for ear pain and sore throat.   Eyes: Negative for pain and visual disturbance.  Respiratory: Negative for cough and shortness of breath.   Cardiovascular: Positive for leg swelling. Negative for chest pain and palpitations.  Gastrointestinal: Negative for abdominal pain and vomiting.  Genitourinary: Negative for dysuria and hematuria.  Musculoskeletal: Positive for gait problem. Negative for arthralgias and back pain.  Skin: Positive for color change. Negative for rash.  Neurological: Negative for seizures, syncope, weakness, light-headedness and headaches.  Psychiatric/Behavioral: Positive for confusion.  All other systems reviewed and are negative.   Physical Exam Updated Vital Signs BP 133/81   Pulse (!) 102   Temp 98.6 F (37 C) (Oral)   Resp 17   Ht 6\' 3"  (1.905 m)   Wt (!) 205.3 kg   SpO2 94%   BMI 56.57 kg/m   Physical Exam Vitals and nursing note reviewed.  Constitutional:      General: He is not in acute distress.    Appearance: Normal appearance. He is well-developed. He is not ill-appearing, toxic-appearing or diaphoretic.  HENT:     Head: Normocephalic and atraumatic.  Eyes:     Conjunctiva/sclera: Conjunctivae normal.  Cardiovascular:     Rate and Rhythm: Normal rate and  regular rhythm.     Heart sounds: No murmur heard.   Pulmonary:     Effort: Pulmonary effort is normal. No respiratory distress.     Breath sounds: Normal breath sounds.  Abdominal:     Palpations: Abdomen is soft.     Tenderness: There is no abdominal tenderness.  Musculoskeletal:        General: Swelling and tenderness present. Normal range of motion.     Cervical back: Neck supple.     Right lower leg: No edema.     Left lower leg: Edema present.     Comments: Left leg with significant edema, erythema compared to the right.  DP pulses located with Doppler.  Gross sensations and strength intact.  No visible wounds, sources of fluctuance or drainage.  Warm to the touch.  Erythema extending up above his knee, just below his groin.  Please see photo.  Skin:    General: Skin is warm and dry.     Capillary Refill: Capillary refill takes less than 2 seconds.     Findings: Erythema present.  Neurological:     General: No focal deficit present.     Mental Status: He is alert and oriented to person, place, and time.     Cranial Nerves: No cranial nerve deficit.     Sensory: No sensory deficit.     Motor: No weakness.     Comments: Mental Status:  Alert, thought content appropriate, able to give a coherent history. Speech fluent without evidence of aphasia. Able to follow 2 step commands without difficulty.  Cranial Nerves:  II: Peripheral visual fields grossly normal, pupils equal, round, reactive to light III,IV, VI: ptosis not present, extra-ocular motions intact bilaterally  V,VII: smile symmetric, facial light touch sensation equal VIII: hearing grossly normal to voice  X: uvula elevates symmetrically  XI: bilateral shoulder shrug symmetric and strong XII: midline tongue extension without fassiculations Motor:  Normal tone. 5/5 strength of BUE and BLE major muscle groups including strong and equal  grip strength and dorsiflexion/plantar flexion Sensory: light touch normal in all  extremities. Cerebellar: normal finger-to-nose with bilateral upper extremities, Romberg sign absent Gait: not accessed    Psychiatric:        Mood and Affect: Mood normal.       ED Results / Procedures / Treatments   Labs (all labs ordered are listed, but only abnormal results are displayed) Labs Reviewed  COMPREHENSIVE METABOLIC PANEL - Abnormal; Notable for the following components:      Result Value   CO2 21 (*)    Glucose, Bld 129 (*)    BUN 27 (*)    Albumin 3.2 (*)    ALT 52 (*)    Total Bilirubin 1.5 (*)    All other components within normal limits  CBC WITH DIFFERENTIAL/PLATELET - Abnormal; Notable for the following components:   RBC 4.15 (*)    Hemoglobin 12.9 (*)    HCT 38.9 (*)    Abs Immature Granulocytes 0.48 (*)    All other components within normal limits  URINALYSIS, ROUTINE W REFLEX MICROSCOPIC - Abnormal; Notable for the following components:   Hgb urine dipstick SMALL (*)    All other components within normal limits  BRAIN NATRIURETIC PEPTIDE - Abnormal; Notable for the following components:   B Natriuretic Peptide 157.0 (*)    All other components within normal limits  LACTIC ACID, PLASMA    EKG EKG Interpretation  Date/Time:  Friday May 06 2020 09:56:40 EDT Ventricular Rate:  98 PR Interval:    QRS Duration: 165 QT Interval:  398 QTC Calculation: 509 R Axis:   -42 Text Interpretation: Atrial fibrillation RBBB and LAFB No significant change since last tracing Confirmed by Deno Etienne [phone]) on 05/06/2020 11:23:52 AM   Radiology CT Angio Head W or Wo Contrast  Result Date: 05/06/2020 CLINICAL DATA:  TIA. Left lower extremity weakness, redness and swelling. EXAM: CT ANGIOGRAPHY HEAD AND NECK TECHNIQUE: Multidetector CT imaging of the head and neck was performed using the standard protocol during bolus administration of intravenous contrast. Multiplanar CT image reconstructions and MIPs were obtained to evaluate the vascular anatomy.  Carotid stenosis measurements (when applicable) are obtained utilizing NASCET criteria, using the distal internal carotid diameter as the denominator. CONTRAST:  4mL OMNIPAQUE IOHEXOL 350 MG/ML SOLN COMPARISON:  MRI same day FINDINGS: CT HEAD FINDINGS Brain: No acute finding. Old white matter ischemic change in the right frontal lobe. The remainder the brain appears normal by CT. No mass, hemorrhage, hydrocephalus or extra-axial collection. Vascular: No abnormal vascular finding. Skull: Normal Sinuses: Left maxillary, ethmoid and frontal inflammatory disease. Orbits: Negative Review of the MIP images confirms the above findings CTA NECK FINDINGS Aortic arch: Aortic atherosclerosis.  Branching pattern is normal. Right carotid system: Common carotid artery widely patent to the bifurcation. Calcified plaque at the carotid bifurcation and ICA bulb but no stenosis. Cervical ICA widely patent. Left carotid system: Common carotid artery widely patent to the bifurcation. Minimal plaque at the carotid bifurcation without stenosis. Cervical ICA widely patent. Vertebral arteries: Right vertebral artery is dominant. Right vertebral artery origin widely patent. Right vertebral artery widely patent through the cervical region to the foramen magnum. Left vertebral artery is very diminutive. Small amount of flow in the tiny left vertebral artery. Skeleton: Mid cervical spondylosis.  No acute bone finding. Other neck: No neck mass or lymphadenopathy. Upper chest: Emphysema and pulmonary scarring. Review of the MIP images confirms the above findings CTA HEAD FINDINGS Anterior circulation: Both internal carotid  arteries widely patent through the skull base and siphon regions. Anterior and middle cerebral vessels are patent. No large or medium vessel occlusion. No correctable proximal stenosis. Posterior circulation: Both vertebral arteries patent to the basilar. The right is dominant. The left is a tiny vessel. Posterior circulation  branch vessels are normal. Patent posterior communicating arteries on both sides. Venous sinuses: Patent and normal. Anatomic variants: None significant otherwise. Review of the MIP images confirms the above findings IMPRESSION: 1. Mild atherosclerotic change at both carotid bifurcations but no stenosis. 2. No intracranial large or medium vessel occlusion or correctable proximal stenosis. 3. Emphysema and aortic atherosclerosis. Aortic Atherosclerosis (ICD10-I70.0) and Emphysema (ICD10-J43.9). Electronically Signed   By: Nelson Chimes M.D.   On: 05/06/2020 12:55   CT Angio Neck W and/or Wo Contrast  Result Date: 05/06/2020 CLINICAL DATA:  TIA. Left lower extremity weakness, redness and swelling. EXAM: CT ANGIOGRAPHY HEAD AND NECK TECHNIQUE: Multidetector CT imaging of the head and neck was performed using the standard protocol during bolus administration of intravenous contrast. Multiplanar CT image reconstructions and MIPs were obtained to evaluate the vascular anatomy. Carotid stenosis measurements (when applicable) are obtained utilizing NASCET criteria, using the distal internal carotid diameter as the denominator. CONTRAST:  53mL OMNIPAQUE IOHEXOL 350 MG/ML SOLN COMPARISON:  MRI same day FINDINGS: CT HEAD FINDINGS Brain: No acute finding. Old white matter ischemic change in the right frontal lobe. The remainder the brain appears normal by CT. No mass, hemorrhage, hydrocephalus or extra-axial collection. Vascular: No abnormal vascular finding. Skull: Normal Sinuses: Left maxillary, ethmoid and frontal inflammatory disease. Orbits: Negative Review of the MIP images confirms the above findings CTA NECK FINDINGS Aortic arch: Aortic atherosclerosis.  Branching pattern is normal. Right carotid system: Common carotid artery widely patent to the bifurcation. Calcified plaque at the carotid bifurcation and ICA bulb but no stenosis. Cervical ICA widely patent. Left carotid system: Common carotid artery widely patent  to the bifurcation. Minimal plaque at the carotid bifurcation without stenosis. Cervical ICA widely patent. Vertebral arteries: Right vertebral artery is dominant. Right vertebral artery origin widely patent. Right vertebral artery widely patent through the cervical region to the foramen magnum. Left vertebral artery is very diminutive. Small amount of flow in the tiny left vertebral artery. Skeleton: Mid cervical spondylosis.  No acute bone finding. Other neck: No neck mass or lymphadenopathy. Upper chest: Emphysema and pulmonary scarring. Review of the MIP images confirms the above findings CTA HEAD FINDINGS Anterior circulation: Both internal carotid arteries widely patent through the skull base and siphon regions. Anterior and middle cerebral vessels are patent. No large or medium vessel occlusion. No correctable proximal stenosis. Posterior circulation: Both vertebral arteries patent to the basilar. The right is dominant. The left is a tiny vessel. Posterior circulation branch vessels are normal. Patent posterior communicating arteries on both sides. Venous sinuses: Patent and normal. Anatomic variants: None significant otherwise. Review of the MIP images confirms the above findings IMPRESSION: 1. Mild atherosclerotic change at both carotid bifurcations but no stenosis. 2. No intracranial large or medium vessel occlusion or correctable proximal stenosis. 3. Emphysema and aortic atherosclerosis. Aortic Atherosclerosis (ICD10-I70.0) and Emphysema (ICD10-J43.9). Electronically Signed   By: Nelson Chimes M.D.   On: 05/06/2020 12:55   MR BRAIN WO CONTRAST  Result Date: 05/06/2020 CLINICAL DATA:  Left lower extremity weakness, redness and swelling. EXAM: MRI HEAD WITHOUT CONTRAST TECHNIQUE: Multiplanar, multiecho pulse sequences of the brain and surrounding structures were obtained without intravenous contrast. COMPARISON:  06/22/2016 FINDINGS: Brain:  Diffusion imaging does not show any acute or subacute  infarction. No focal abnormality affects the brainstem or cerebellum. Cerebral hemispheres show an old right frontal subcortical white matter infarction and an old small infarction at the left frontal operculum. No evidence of mass lesion, hemorrhage, hydrocephalus or extra-axial collection. Vascular: Major vessels at the base of the brain show flow. Skull and upper cervical spine: Negative Sinuses/Orbits: Left maxillary, ethmoid and frontal sinus opacification and inflammatory change. Orbits negative. Other: None IMPRESSION: 1. No acute intracranial finding. 2. Old small vessel infarctions at the right frontal subcortical white matter and left frontal operculum. 3. Left maxillary, ethmoid and frontal sinusitis. Electronically Signed   By: Nelson Chimes M.D.   On: 05/06/2020 10:49   VAS [place] LOWER EXTREMITY VENOUS (DVT) (ONLY [facility] & [facility])  Result Date: 05/06/2020  Lower Venous DVTStudy Indications: Edema, and Swelling.  Limitations: Body habitus and poor ultrasound/tissue interface. Comparison Study: 05/06/15 previous Performing Technologist: Abram Sander RVS  Examination Guidelines: A complete evaluation includes B-mode imaging, spectral Doppler, color Doppler, and power Doppler as needed of all accessible portions of each vessel. Bilateral testing is considered an integral part of a complete examination. Limited examinations for reoccurring indications may be performed as noted. The reflux portion of the exam is performed with the patient in reverse Trendelenburg.  +-----+---------------+---------+-----------+----------+--------------+ RIGHTCompressibilityPhasicitySpontaneityPropertiesThrombus Aging +-----+---------------+---------+-----------+----------+--------------+ CFV  Full           Yes      Yes                                 +-----+---------------+---------+-----------+----------+--------------+   +---------+---------------+---------+-----------+----------+--------------+ LEFT      CompressibilityPhasicitySpontaneityPropertiesThrombus Aging +---------+---------------+---------+-----------+----------+--------------+ CFV      Full           Yes      Yes                                 +---------+---------------+---------+-----------+----------+--------------+ SFJ      Full                                                        +---------+---------------+---------+-----------+----------+--------------+ FV Prox  Full                                                        +---------+---------------+---------+-----------+----------+--------------+ FV Mid   Full                                                        +---------+---------------+---------+-----------+----------+--------------+ FV DistalFull                                                        +---------+---------------+---------+-----------+----------+--------------+ PFV  Full                                                        +---------+---------------+---------+-----------+----------+--------------+ POP      Full           Yes      Yes                                 +---------+---------------+---------+-----------+----------+--------------+ PTV                     Yes      Yes                                 +---------+---------------+---------+-----------+----------+--------------+ PERO                                                  Not visualized +---------+---------------+---------+-----------+----------+--------------+     Summary: RIGHT: - No evidence of common femoral vein obstruction.  LEFT: - There is no evidence of deep vein thrombosis in the lower extremity. However, portions of this examination were limited- see technologist comments above.  - No cystic structure found in the popliteal fossa.  *See table(s) above for measurements and observations. Electronically signed by Servando Snare MD on 05/06/2020 at 9:21:55 AM.    Final      Procedures Procedures (including critical care time)  Medications Ordered in ED Medications  dalbavancin (DALVANCE) 1,500 mg in dextrose 5 % 500 mL IVPB (1,500 mg Intravenous New Bag/Given 05/06/20 1527)  ketorolac (TORADOL) injection 60 mg (60 mg Intramuscular Given 05/06/20 0701)  HYDROmorphone (DILAUDID) injection 0.5 mg (0.5 mg Intravenous Given 05/06/20 1001)  iohexol (OMNIPAQUE) 350 MG/ML injection 75 mL (75 mLs Intravenous Contrast Given 05/06/20 1213)    ED Course  I have reviewed the triage vital signs and the nursing notes.  Pertinent labs & imaging results that were available during my care of the patient were reviewed by me and considered in my medical decision making (see chart for details).    MDM Rules/Calculators/A&P                          64 year old male with complaints of left leg swelling and erythema x10 days, along with suspected TIA 1 week ago on presentation, he is alert, oriented, nontoxic-appearing, no acute distress, with no gross neuro deficits, speech is fluent without noticeable facial droop.  Vitals overall reassuring.  Physical exam with extremely edematous and erythematous left leg which is warm to the touch.  Strong is located with Doppler.  Sensations, strength and ROM intact.  Tender to palpation globally.  No neuro deficits on exam.  CBC without leukocytosis, hemoglobin of 12.9, stable.  CMP without any significant electrode abnormalities, CO2 of 21, glucose of 129.  Creatinine of 1.13, BUN of 27.  Patient did endorse that he was likely dehydrated.  ALT of 52, normal AST, unclear cause.  Total bilirubin 1.5.  Patient denies any abdominal pain, abdomen is soft and nontender.  UA with  small hemoglobin, which does not appear on previous UAs.  BNP of 157, slightly increased from 4 years ago but largely unchanged.    I discussed the treatment options for cellulitis, including hospital admission for IV antibiotics, and one-time administration of dalbavancin.   Patient has concerns over antibiotic ineffectiveness and having to return to the ER and waiting for an extended period of time.  I explained to the patient that he will have infectious disease follow-up after dalbavancin dose.   DVT ultrasound negative.  Spoke with Dr. Leonel Ramsay about the patient's TIA symptoms, he recommends a CTA of the head and neck, MRI of the brain without contrast to further evaluate. If this is negative, Dr. Leonel Ramsay states the patient could be cleared neurologically.  Patient is anticoagulated on Xarelto, but may require change of anticoagulation.  Will hold dalbavancin pending imaging.  Pain treated with Toradol, which was not helpful.  Will try low-dose of Dilaudid per patient request as he is allergic to morphine and codeine.  MRI of the brain along with a CT of the head and neck without any acute abnormalities. No evidence of new TIA.  Patient pain significantly improved with 0.5 mg of Dilaudid.  Patient will be receiving dalbavancin infusion and ambulatory referral to ID has been placed. Per discussion with Dr. Tyrone Nine, will send script of Oxycodone given allergy to morphine and codeine. PDMP checked, last script for xanax in June of 2021 for 30 days, no other opiods seen.   Pt awaiting dalbavancin infusion. Pt informed about his slightly elevated ALT and bilirubin, along with hematuria. Encouraged to have this rechecked with PCP. Signed out case to Tacy Dura who will oversee his infusion and discharge if tolerating well.   Return precautions discussed w/ patient, will follow up with ID and PCP.   At this stage in the ED course, patient has been adequately screened and stable for discaharge.  Case discussed with Dr. Tyrone Nine who is agreeable with the above plan and disposition.   Final Clinical Impression(s) / ED Diagnoses Final diagnoses:  Left leg cellulitis  Hematuria, unspecified type  Hyperbilirubinemia    Rx / DC Orders ED Discharge Orders         Ordered     Ambulatory referral to Infectious Disease       Comments: Cellulitis patient:  Received dalbavancin on 05/06/2020.   05/06/20 1300    Ambulatory referral to Infectious Disease       Comments: Cellulitis patient:  Received dalbavancin on 05/06/2020.   05/06/20 1403    oxyCODONE (ROXICODONE) 5 MG immediate release tablet  Every 4 hours PRN        05/06/20 [place], Kento Gossman A, PA-C 05/06/20 [place], [place], DO 05/08/20 2304

## 2020-05-06 NOTE — ED Notes (Signed)
Patient transported to CT 

## 2020-05-09 ENCOUNTER — Telehealth: Payer: Self-pay

## 2020-05-09 NOTE — Telephone Encounter (Signed)
Dr Demetrios Isaacs called our office to ensure this patient had a follow up visit.    I have scheduled him for tomorrow . I spoke with the patient and the provider.   Laverle Patter, RN

## 2020-05-10 ENCOUNTER — Inpatient Hospital Stay: Payer: 59 | Admitting: Infectious Diseases

## 2020-05-10 ENCOUNTER — Other Ambulatory Visit: Payer: Self-pay

## 2020-05-10 ENCOUNTER — Ambulatory Visit: Payer: 59 | Admitting: Infectious Diseases

## 2020-05-10 VITALS — BP 106/70 | HR 86 | Temp 98.3°F

## 2020-05-10 DIAGNOSIS — L03116 Cellulitis of left lower limb: Secondary | ICD-10-CM

## 2020-05-10 NOTE — Addendum Note (Signed)
Addended by: Rosiland Oz on: 05/10/2020 01:46 PM   Modules accepted: Orders

## 2020-05-10 NOTE — Progress Notes (Signed)
[facility]                                                             [place], [place], [place], [address]                                                                  Phn. [phone]; Fax: [phone]                                                                             Date: 05/10/20  Reason for Referral: Left LE cellulitis  Referring Provider: Loletta Specter  Assessment Left Leg Cellulitis Varicose veins of LLE  Venous Insufficiency  Plan Patient received with the 1500mg  IV dalbavancin one dose in the ED on 9/17 and reports there is some improvement in his erythema and swelling. He is frustrated about the slow improvement in the erythema and swelling. I explained to him given that he has some underlying venous insufficiency and leg swelling which is probably causing it slow to improve. He definitely says he has noticed some improvement in his erythema and swelling. However, his Left leg is still tender and significantly swollen. Hence, I would like to get a CT of his left leg. His Echo in 2019 showed normal EF.    Given that he has received IV dalbavancin on 9/17 which has a half life of approx 2 weeks, I don't think he needs additional antibiotics at this point. I will follow him up in a week. I have also explained him to call our office if the redness/swelling continues to worse and will try to see in the clinic. I also explained him that he needs to go to the ED if he develops fever/chills/severe pain in his left leg  or systemic symptoms.   He will also need to have evaluation by Vascular eventually regarding his venous insufficiency of his LE.   I spent greater than 60 minutes with the patient including greater than 50% of time in face to face counsel of the patient and in coordination of their care.    Rosiland Oz, MD [facility]    Office phone [phone] Fax no. [phone] ______________________________________________________________________________________________________________________  HPI: 64 year old Caucasian male with past medical history of morbid obesity, PAF, hypertension, varicose veins of left lower extremity, aortic stenosis, CVA, venous insufficiency, OSA, lower extremity edema who is here for evaluation of cellulitis of his left leg. Patient says left leg swelling and redness started suddenly approx. 11 days ago and was seen at his PCP's Office who gave him Doxycyline which he says took it for approx a week.  However the swelling and redness continued to get worse.  He was seen at the emergency department on 9/17 and  was given 1500 mg of IV dalbavancin.  He is referred to ID for follow-up.  Patient is accompanied by his friend.  He states that the swelling and erythema has somewhat improved after the IV dalbavancin but it has not improved significantly as he expected and is frustrated about it.  He complains of pain but denies significant warmth.  Denies any fever and chills.  He is also frustrated about how long he had to wait in the ED to get IV antibiotics.  He denies having cellulitis before in his legs. Denies any known insect bites or trauma. Denies any unusual water exposure. Denies any recent travel. He has a cat at home whose health is good. He is an ex-smoker and drinks alcohol socially. Denies using any drugs. He lives by himself.   ROS: Constitutional: Negative for fever, chills, activity change, appetite change, fatigue and unexpected weight change.  HENT: Negative for congestion, sore throat, rhinorrhea, sneezing, trouble swallowing and sinus pressure.  Eyes: Negative for photophobia and visual disturbance.  Respiratory: Negative for cough, chest tightness, shortness of breath, wheezing and stridor.  Cardiovascular: Negative for chest pain, palpitations and leg swelling.  Gastrointestinal:  Negative for nausea, vomiting, abdominal pain, diarrhea, constipation, blood in stool, abdominal distention and anal bleeding.  Genitourinary: Negative for dysuria, hematuria, flank pain and difficulty urinating.  Musculoskeletal: Negative for myalgias, back pain, joint swelling, arthralgias  Skin: Negative for color change, pallor, rash and wound.  Neurological: Negative for dizziness, tremors, weakness and light-headedness.  Hematological: Negative for adenopathy. Does not bruise/bleed easily.  Psychiatric/Behavioral: Negative for behavioral problems, confusion, sleep disturbance, dysphoric mood, decreased concentration and agitation.   Past Medical History:  Diagnosis Date  . Allergy    RHINITIS  . Arthritis    thumb  . GERD (gastroesophageal reflux disease)    "not really"  . History of echocardiogram 03/29/2009   EF 50-55%; mild concentric LVH;   . History of nuclear stress test 08/28/2007   moderate diaphragmatic attenuation; EKG neg for ischemia   . Hyperlipidemia   . Hypertension    takes Diovan and Toprol  . Obesity   . PAF (paroxysmal atrial fibrillation) (HCC)    associated with OSA  . Right bundle branch block   . Rosacea   . Shortness of breath    With activity  . Sleep apnea    CPAP  . Smoker   . Systolic murmur    Past Surgical History:  Procedure Laterality Date  . FINGER SURGERY     Reconstruction of Left thumb and L index finger.  Reconstruction   . KNEE ARTHROSCOPY    . SHOULDER CLOSED REDUCTION     Procedure: CLOSED REDUCTION SHOULDER;  Surgeon: Lawrence Santiago [facility];  Location: [facility];  Service: Orthopedics;  Laterality: Right;  . SHOULDER OPEN ROTATOR CUFF REPAIR  08/11/2011   Procedure: ROTATOR CUFF REPAIR SHOULDER OPEN;  Surgeon: Willa Frater III;  Location: [facility];  Service: Orthopedics;  Laterality: Right;  . TONSILLECTOMY     as a child   Current Outpatient Medications on File Prior to Visit  Medication Sig Dispense Refill  . ALPRAZolam  (XANAX) 1 MG tablet TAKE 1 TABLET(1 MG) BY MOUTH AT BEDTIME AS NEEDED FOR SLEEP (Patient taking differently: Take 1 mg by mouth at bedtime as needed for anxiety or sleep. ) 30 tablet 0  . atorvastatin (LIPITOR) 10 MG tablet TAKE 1 TABLET BY MOUTH EVERY DAY AT 6 PM (Patient taking differently: Take 10 mg by mouth  daily. ) 90 tablet 3  . diltiazem (CARDIZEM CD) 120 MG 24 hr capsule Take 1 capsule (120 mg total) by mouth daily. 90 capsule 3  . hydrocortisone 2.5 % cream Apply to external ears as needed for itching. 30 g 5  . methocarbamol (ROBAXIN) 500 MG tablet TAKE 1 TABLET(500 MG) BY MOUTH FOUR TIMES DAILY (Patient taking differently: Take 500 mg by mouth 4 (four) times daily as needed for muscle spasms. ) 30 tablet 1  . metoprolol succinate (TOPROL-XL) 50 MG 24 hr tablet Take with or immediately following a meal. (Patient taking differently: Take 50 mg by mouth daily. Take with or immediately following a meal.) 90 tablet 3  . oxyCODONE (ROXICODONE) 5 MG immediate release tablet Take 1 tablet (5 mg total) by mouth every 4 (four) hours as needed for up to 5 days for severe pain. 30 tablet 0  . rivaroxaban (XARELTO) 20 MG TABS tablet TAKE 1 TABLET(20 MG) BY MOUTH DAILY WITH SUPPER (Patient taking differently: Take 20 mg by mouth daily with supper. ) 90 tablet 3  . valsartan-hydrochlorothiazide (DIOVAN-HCT) 160-25 MG tablet Take 1 tablet by mouth daily. 90 tablet 3  . doxycycline (VIBRA-TABS) 100 MG tablet Take 1 tablet (100 mg total) by mouth 2 (two) times daily. (Patient not taking: Reported on 05/10/2020) 28 tablet 0   No current facility-administered medications on file prior to visit.   Allergies  Allergen Reactions  . Codeine Shortness Of Breath and Swelling  . Morphine And Related Shortness Of Breath and Swelling   Social History   Socioeconomic History  . Marital status: Single    Spouse name: Not on file  . Number of children: Not on file  . Years of education: Not on file  . Highest  education level: Not on file  Occupational History  . Not on file  Tobacco Use  . Smoking status: Former Smoker    Packs/day: 1.00    Years: 40.00    Pack years: 40.00    Types: Cigarettes    Quit date: 06/20/2016    Years since quitting: 3.8  . Smokeless tobacco: Never Used  Vaping Use  . Vaping Use: Never used  Substance and Sexual Activity  . Alcohol use: Yes    Alcohol/week: 11.0 standard drinks    Types: 6 Cans of beer, 5 Shots of liquor per week  . Drug use: No  . Sexual activity: Not Currently  Other Topics Concern  . Not on file  Social History Narrative  . Not on file   Social Determinants of Health   Financial Resource Strain:   . Difficulty of Paying Living Expenses: Not on file  Food Insecurity:   . Worried About Charity fundraiser in the Last Year: Not on file  . Ran Out of Food in the Last Year: Not on file  Transportation Needs:   . Lack of Transportation (Medical): Not on file  . Lack of Transportation (Non-Medical): Not on file  Physical Activity:   . Days of Exercise per Week: Not on file  . Minutes of Exercise per Session: Not on file  Stress:   . Feeling of Stress : Not on file  Social Connections:   . Frequency of Communication with Friends and Family: Not on file  . Frequency of Social Gatherings with Friends and Family: Not on file  . Attends Religious Services: Not on file  . Active Member of Clubs or Organizations: Not on file  . Attends Archivist  Meetings: Not on file  . Marital Status: Not on file  Intimate Partner Violence:   . Fear of Current or Ex-Partner: Not on file  . Emotionally Abused: Not on file  . Physically Abused: Not on file  . Sexually Abused: Not on file   Vitals BP 106/70   Pulse 86   Temp 98.3 F (36.8 C) (Oral)    Examination  General - very frustrated, morbidly obese  HEENT - PEERLA, no pallor and no icterus Chest - b/l clear air entry, no additional sounds CVS- Normal Q6V7, RRR, systolic murmur  grade 3 Abdomen - Soft, Non tender , non distended,large abdomen Ext-  Left leg    Neuro: grossly normal Back - WNL Psych : calm and cooperative  Recent labs CBC Latest Ref Rng & Units 05/05/2020 04/26/2020 02/24/2019  WBC 4.0 - 10.5 K/uL 10.1 8.7 8.4  Hemoglobin 13.0 - 17.0 g/dL 12.9(L) 15.7 15.3  Hematocrit 39 - 52 % 38.9(L) 46.4 44.7  Platelets 150 - 400 K/uL 304 221 239   CMP Latest Ref Rng & Units 05/05/2020 04/26/2020 02/24/2019  Glucose 70 - 99 mg/dL 129(H) 95 102(H)  BUN 8 - 23 mg/dL 27(H) 17 16  Creatinine 0.61 - 1.24 mg/dL 1.13 1.13 1.15  Sodium 135 - 145 mmol/L 137 136 136  Potassium 3.5 - 5.1 mmol/L 3.9 4.1 4.5  Chloride 98 - 111 mmol/L 102 100 99  CO2 22 - 32 mmol/L 21(L) 22 24  Calcium 8.9 - 10.3 mg/dL 9.3 9.8 9.7  Total Protein 6.5 - 8.1 g/dL 7.8 8.2 7.4  Total Bilirubin 0.3 - 1.2 mg/dL 1.5(H) 1.0 0.8  Alkaline Phos 38 - 126 U/L 97 99 95  AST 15 - 41 U/L 30 19 18   ALT 0 - 44 U/L 52(H) 24 25    Pertinent Imaging Left LE Vascular US 05/06/20  RIGHT: - No evidence of common femoral vein obstruction. LEFT: - There is no evidence of deep vein thrombosis in the lower extremity. However, portions of this examination were limited- see technologist comments above. - No cystic structure found in the popliteal fossa.  All pertinent labs/Imagings/notes reviewed. All pertinent plain films and CT images have been personally visualized and interpreted; radiology reports have been reviewed. Decision making incorporated into the Impression / Recommendations.

## 2020-05-11 LAB — SEDIMENTATION RATE: Sed Rate: 110 mm/h — ABNORMAL HIGH (ref 0–20)

## 2020-05-11 LAB — C-REACTIVE PROTEIN: CRP: 143.6 mg/L — ABNORMAL HIGH (ref <8.0)

## 2020-05-12 ENCOUNTER — Telehealth: Payer: Self-pay | Admitting: Family Medicine

## 2020-05-12 NOTE — Telephone Encounter (Signed)
Pt called for refills of oxycodone given by hospital. He has some left but will needs refill soon. Pt uses [facility] and [id number]. Pt can be reached at [phone].  ALSO pt would liek to speak to [facility] concerning ongoing issue.

## 2020-05-13 ENCOUNTER — Other Ambulatory Visit: Payer: Self-pay | Admitting: Infectious Diseases

## 2020-05-13 ENCOUNTER — Telehealth: Payer: Self-pay

## 2020-05-13 LAB — WOUND CULTURE
MICRO NUMBER:: 10981288
SPECIMEN QUALITY:: ADEQUATE

## 2020-05-13 MED ORDER — LINEZOLID 600 MG PO TABS: 600.0000 mg | ORAL_TABLET | Freq: Two times a day (BID) | ORAL | 0 refills | Status: DC

## 2020-05-13 MED ORDER — OXYCODONE-ACETAMINOPHEN 5-325 MG PO TABS
1.0000 | ORAL_TABLET | ORAL | 0 refills | Status: DC | PRN
Start: 2020-05-13 — End: 2020-07-07

## 2020-05-13 NOTE — Telephone Encounter (Signed)
Called patient to follow up on how he is doing. Patient states that leg is still about the same. States swelling might have gone down a little since getting the infusion, but is not too sure. No changes in pain or redness.  Is scheduled for CT on 9/30 and follow up on 10/1

## 2020-05-13 NOTE — Telephone Encounter (Signed)
-----   Message from Rosiland Oz, MD sent at 05/13/2020  7:58 AM EDT ----- Regarding: follow up Could you please check with the patient how is the pain/swelling and redness in his left leg? I saw him in the clinic on Tuesday. He had got the dalbavancin in the Hospital last Friday. I am expecting some improvement by now. Also, could you please check on the status of CT of the left leg?

## 2020-05-13 NOTE — Telephone Encounter (Signed)
He is still having a tremendous amount of pain.  He has seen ID.  Explained that this can sometimes take quite some time to get better.

## 2020-05-13 NOTE — Telephone Encounter (Signed)
Called patient regarding Md's message. Will pick up prescription today. Does not have any questions at this moment. Will call office if there is any issues getting medication or tolerating medication. [place]

## 2020-05-13 NOTE — Telephone Encounter (Signed)
OK, thank you. I am sending him a prescription of Linezolid 600mg  PO q12 hrs for 2 weeks for him. I will see how he does over the  weekend with it. If no significant improvement in pain/erythema over the weekend, I will recommend him to go to the hospital.

## 2020-05-14 ENCOUNTER — Other Ambulatory Visit: Payer: Self-pay | Admitting: Infectious Diseases

## 2020-05-19 ENCOUNTER — Other Ambulatory Visit: Payer: Self-pay

## 2020-05-19 ENCOUNTER — Ambulatory Visit (HOSPITAL_COMMUNITY)
Admission: RE | Admit: 2020-05-19 | Discharge: 2020-05-19 | Disposition: A | Discharge status: Home / Self Care | Payer: 59 | Source: Ambulatory Visit | Attending: Infectious Diseases | Admitting: Infectious Diseases

## 2020-05-19 DIAGNOSIS — L03116 Cellulitis of left lower limb: Secondary | ICD-10-CM | POA: Insufficient documentation

## 2020-05-19 MED ORDER — IOHEXOL 300 MG/ML  SOLN
100.0000 mL | Freq: Once | INTRAMUSCULAR | Status: AC | PRN
  Administered 2020-05-19: 100 mL via INTRAVENOUS

## 2020-05-20 ENCOUNTER — Ambulatory Visit (INDEPENDENT_AMBULATORY_CARE_PROVIDER_SITE_OTHER): Payer: 59 | Admitting: Infectious Diseases

## 2020-05-20 ENCOUNTER — Encounter: Payer: Self-pay | Admitting: Infectious Diseases

## 2020-05-20 ENCOUNTER — Inpatient Hospital Stay (HOSPITAL_COMMUNITY)
Admission: AD | Admit: 2020-05-20 | Discharge: 2020-05-23 | DRG: 603 | Disposition: A | Discharge status: Home / Self Care | Payer: 59 | Source: Ambulatory Visit | Attending: Internal Medicine | Admitting: Internal Medicine

## 2020-05-20 ENCOUNTER — Telehealth: Payer: Self-pay

## 2020-05-20 VITALS — BP 114/71 | HR 77

## 2020-05-20 DIAGNOSIS — Z885 Allergy status to narcotic agent status: Secondary | ICD-10-CM

## 2020-05-20 DIAGNOSIS — Z20822 Contact with and (suspected) exposure to covid-19: Secondary | ICD-10-CM | POA: Diagnosis present

## 2020-05-20 DIAGNOSIS — G4733 Obstructive sleep apnea (adult) (pediatric): Secondary | ICD-10-CM | POA: Diagnosis present

## 2020-05-20 DIAGNOSIS — F419 Anxiety disorder, unspecified: Secondary | ICD-10-CM | POA: Diagnosis not present

## 2020-05-20 DIAGNOSIS — E871 Hypo-osmolality and hyponatremia: Secondary | ICD-10-CM | POA: Diagnosis present

## 2020-05-20 DIAGNOSIS — L03116 Cellulitis of left lower limb: Principal | ICD-10-CM | POA: Diagnosis present

## 2020-05-20 DIAGNOSIS — K625 Hemorrhage of anus and rectum: Secondary | ICD-10-CM

## 2020-05-20 DIAGNOSIS — I872 Venous insufficiency (chronic) (peripheral): Secondary | ICD-10-CM | POA: Diagnosis present

## 2020-05-20 DIAGNOSIS — E119 Type 2 diabetes mellitus without complications: Secondary | ICD-10-CM | POA: Diagnosis present

## 2020-05-20 DIAGNOSIS — D509 Iron deficiency anemia, unspecified: Secondary | ICD-10-CM | POA: Diagnosis present

## 2020-05-20 DIAGNOSIS — L02416 Cutaneous abscess of left lower limb: Secondary | ICD-10-CM | POA: Diagnosis present

## 2020-05-20 DIAGNOSIS — I10 Essential (primary) hypertension: Secondary | ICD-10-CM | POA: Diagnosis present

## 2020-05-20 DIAGNOSIS — I4819 Other persistent atrial fibrillation: Secondary | ICD-10-CM | POA: Diagnosis present

## 2020-05-20 DIAGNOSIS — Z79899 Other long term (current) drug therapy: Secondary | ICD-10-CM

## 2020-05-20 DIAGNOSIS — I350 Nonrheumatic aortic (valve) stenosis: Secondary | ICD-10-CM | POA: Diagnosis present

## 2020-05-20 DIAGNOSIS — Z6841 Body Mass Index (BMI) 40.0 and over, adult: Secondary | ICD-10-CM | POA: Diagnosis not present

## 2020-05-20 LAB — CBC
HCT: 29.4 % — ABNORMAL LOW (ref 39.0–52.0)
Hemoglobin: 9.7 g/dL — ABNORMAL LOW (ref 13.0–17.0)
MCH: 30.3 pg (ref 26.0–34.0)
MCHC: 33 g/dL (ref 30.0–36.0)
MCV: 91.9 fL (ref 80.0–100.0)
Platelets: 404 10*3/uL — ABNORMAL HIGH (ref 150–400)
RBC: 3.2 MIL/uL — ABNORMAL LOW (ref 4.22–5.81)
RDW: 13.9 % (ref 11.5–15.5)
WBC: 9.4 10*3/uL (ref 4.0–10.5)
nRBC: 0 % (ref 0.0–0.2)

## 2020-05-20 LAB — RETICULOCYTES
Immature Retic Fract: 21.5 % — ABNORMAL HIGH (ref 2.3–15.9)
RBC.: 3.27 MIL/uL — ABNORMAL LOW (ref 4.22–5.81)
Retic Count, Absolute: 69.7 10*3/uL (ref 19.0–186.0)
Retic Ct Pct: 2.1 % (ref 0.4–3.1)

## 2020-05-20 LAB — CREATININE, SERUM
Creatinine, Ser: 1.23 mg/dL (ref 0.61–1.24)
GFR calc Af Amer: >60 mL/min (ref >60)
GFR calc non Af Amer: >60 mL/min (ref >60)

## 2020-05-20 LAB — TSH: TSH: 1.982 u[IU]/mL (ref 0.350–4.500)

## 2020-05-20 LAB — RESPIRATORY PANEL BY RT PCR (FLU A&B, COVID)
Influenza A by PCR: NEGATIVE
Influenza B by PCR: NEGATIVE
SARS Coronavirus 2 by RT PCR: NEGATIVE

## 2020-05-20 LAB — VITAMIN B12: Vitamin B-12: 442 pg/mL (ref 180–914)

## 2020-05-20 LAB — IRON AND TIBC
Iron: 20 ug/dL — ABNORMAL LOW (ref 45–182)
Saturation Ratios: 9 % — ABNORMAL LOW (ref 17.9–39.5)
TIBC: 234 ug/dL — ABNORMAL LOW (ref 250–450)
UIBC: 214 ug/dL

## 2020-05-20 LAB — ABO/RH: ABO/RH(D): O NEG

## 2020-05-20 LAB — FOLATE: Folate: 7.8 ng/mL (ref >5.9)

## 2020-05-20 LAB — T4, FREE: Free T4: 0.95 ng/dL (ref 0.61–1.12)

## 2020-05-20 LAB — HEMOGLOBIN A1C
Hgb A1c MFr Bld: 5.8 % — ABNORMAL HIGH (ref 4.8–5.6)
Mean Plasma Glucose: 119.76 mg/dL

## 2020-05-20 LAB — FERRITIN: Ferritin: 392 ng/mL — ABNORMAL HIGH (ref 24–336)

## 2020-05-20 LAB — TYPE AND SCREEN
ABO/RH(D): O NEG
Antibody Screen: NEGATIVE

## 2020-05-20 LAB — HIV ANTIBODY (ROUTINE TESTING W REFLEX): HIV Screen 4th Generation wRfx: NONREACTIVE

## 2020-05-20 MED ORDER — SODIUM CHLORIDE 0.9 % IV SOLN: 2.0000 g | Freq: Once | INTRAVENOUS | Status: DC

## 2020-05-20 MED ORDER — SODIUM CHLORIDE 0.9 % IV SOLN: INTRAVENOUS | Status: DC

## 2020-05-20 MED ORDER — SODIUM CHLORIDE 0.9 % IV SOLN
2.0000 g | Freq: Once | INTRAVENOUS | Status: AC
  Administered 2020-05-20: 2 g via INTRAVENOUS
  Filled 2020-05-20: qty 2

## 2020-05-20 MED ORDER — VANCOMYCIN HCL IN DEXTROSE 1-5 GM/200ML-% IV SOLN: 1000.0000 mg | Freq: Once | INTRAVENOUS | Status: DC

## 2020-05-20 MED ORDER — IRBESARTAN 75 MG PO TABS
150.0000 mg | ORAL_TABLET | Freq: Every day | ORAL | Status: DC
  Administered 2020-05-21 – 2020-05-23 (×3): 150 mg via ORAL
  Filled 2020-05-20 (×3): qty 2

## 2020-05-20 MED ORDER — OXYCODONE-ACETAMINOPHEN 5-325 MG PO TABS
1.0000 | ORAL_TABLET | ORAL | Status: DC | PRN
  Administered 2020-05-21 – 2020-05-23 (×8): 1 via ORAL
  Filled 2020-05-20 (×9): qty 1

## 2020-05-20 MED ORDER — VALSARTAN-HYDROCHLOROTHIAZIDE 160-25 MG PO TABS: 1.0000 | ORAL_TABLET | Freq: Every day | ORAL | Status: DC

## 2020-05-20 MED ORDER — PANTOPRAZOLE SODIUM 40 MG IV SOLR
40.0000 mg | INTRAVENOUS | Status: DC
  Administered 2020-05-20 – 2020-05-22 (×3): 40 mg via INTRAVENOUS
  Filled 2020-05-20 (×3): qty 40

## 2020-05-20 MED ORDER — SODIUM CHLORIDE 0.9 % IV SOLN
2.0000 g | Freq: Three times a day (TID) | INTRAVENOUS | Status: DC
  Administered 2020-05-21 – 2020-05-23 (×8): 2 g via INTRAVENOUS
  Filled 2020-05-20 (×9): qty 2

## 2020-05-20 MED ORDER — VANCOMYCIN HCL 1500 MG/300ML IV SOLN
1500.0000 mg | Freq: Two times a day (BID) | INTRAVENOUS | Status: DC
  Administered 2020-05-21 – 2020-05-22 (×4): 1500 mg via INTRAVENOUS
  Filled 2020-05-20 (×6): qty 300

## 2020-05-20 MED ORDER — VANCOMYCIN HCL 2000 MG/400ML IV SOLN
2000.0000 mg | Freq: Once | INTRAVENOUS | Status: AC
  Administered 2020-05-20: 2000 mg via INTRAVENOUS
  Filled 2020-05-20: qty 400

## 2020-05-20 MED ORDER — KETOROLAC TROMETHAMINE 15 MG/ML IJ SOLN
15.0000 mg | Freq: Once | INTRAMUSCULAR | Status: AC
  Administered 2020-05-20: 15 mg via INTRAVENOUS
  Filled 2020-05-20: qty 1

## 2020-05-20 MED ORDER — HYDROCHLOROTHIAZIDE 25 MG PO TABS
25.0000 mg | ORAL_TABLET | Freq: Every day | ORAL | Status: DC
  Administered 2020-05-21 – 2020-05-23 (×3): 25 mg via ORAL
  Filled 2020-05-20 (×3): qty 1

## 2020-05-20 MED ORDER — METOPROLOL SUCCINATE ER 50 MG PO TB24
50.0000 mg | ORAL_TABLET | Freq: Every day | ORAL | Status: DC
  Administered 2020-05-21 – 2020-05-23 (×3): 50 mg via ORAL
  Filled 2020-05-20 (×3): qty 1

## 2020-05-20 MED ORDER — ATORVASTATIN CALCIUM 10 MG PO TABS
10.0000 mg | ORAL_TABLET | Freq: Every day | ORAL | Status: DC
  Administered 2020-05-20 – 2020-05-22 (×3): 10 mg via ORAL
  Filled 2020-05-20 (×3): qty 1

## 2020-05-20 MED ORDER — RIVAROXABAN 10 MG PO TABS
20.0000 mg | ORAL_TABLET | Freq: Every day | ORAL | Status: DC
  Administered 2020-05-20 – 2020-05-22 (×3): 20 mg via ORAL
  Filled 2020-05-20 (×3): qty 2

## 2020-05-20 MED ORDER — DILTIAZEM HCL ER COATED BEADS 120 MG PO CP24
120.0000 mg | ORAL_CAPSULE | Freq: Every day | ORAL | Status: DC
  Administered 2020-05-21 – 2020-05-23 (×3): 120 mg via ORAL
  Filled 2020-05-20 (×3): qty 1

## 2020-05-20 MED ORDER — HEPARIN SODIUM (PORCINE) 5000 UNIT/ML IJ SOLN: 5000.0000 [IU] | Freq: Three times a day (TID) | INTRAMUSCULAR | Status: DC

## 2020-05-20 MED ORDER — ALPRAZOLAM 0.5 MG PO TABS
1.0000 mg | ORAL_TABLET | Freq: Every evening | ORAL | Status: DC | PRN
  Administered 2020-05-22: 1 mg via ORAL
  Filled 2020-05-20 (×2): qty 2

## 2020-05-20 NOTE — Progress Notes (Addendum)
Pharmacy Antibiotic Note  David [name] is a 64 y.o. male admitted on 05/20/2020 with cellulitis.  Pharmacy has been consulted for vanc/cefepime dosing.  Morbid obesity man who was recently treated with dalbavancin for cellulitis. F/u CT still shows severe cellulitis. Vanc/cefepime have been ordered per ID. We will give bolus and wait for labs. His wt is 205 kg on 9/16  Scr  1.13 on 9/16 pending today>>1.23  Plan: Vanc 2g IV x1 then 1.5g IV q12 Cefepime 2g IV x1 then q8h Level as needed    Temp (24hrs), Avg:98.2 F (36.8 C), Min:98.2 F (36.8 C), Max:98.2 F (36.8 C)  Recent Labs  Lab 05/20/20 1548  WBC 9.4    CrCl cannot be calculated (Unknown ideal weight.).    Allergies  Allergen Reactions  . Codeine Shortness Of Breath and Swelling  . Morphine And Related Shortness Of Breath and Swelling    Antimicrobials this admission: 9/17 dalbavancin 1.5g IV x1 10/1 vanc>> 10/1 cefepime>>  Dose adjustments this admission:   Microbiology results: 9/21 wound>>neg  Onnie Boer, PharmD, BCIDP, AAHIVP, CPP Infectious Disease Pharmacist 05/20/2020 4:26 PM

## 2020-05-20 NOTE — Progress Notes (Signed)
[facility]                                                             [place], [place], [place], [address]                                                                  Phn. [phone]; Fax: [phone]                                                                             Date: 05/10/20  Reason for Referral: Left LE cellulitis  Referring Provider: Loletta Specter  Assessment 64 Year Old male with Morbid Obesity, h/o Varicose Veins and Venous Insufficiency who is here for follow up for Left Leg Cellulitis. Patient received with the 1500mg  IV dalbavancin one dose in the ED on 9/17 and was seen as a FU on 9/21 when he still had significant swelling/tenderness/erythema. CT Left leg 9/30 still showed severe left leg cellulitis with no abscess, findings consistent with nec. Fasc/pyomyositis, septic arthritis or OM. On follow up today approx 2 weeks after his Dalbavancin, he still has significant swelling/pain/tenderness and erythema.   Plan Given no significent improvement in pain/tenderness/erythema and swelling and severe cellulitis findings in CT, it is reasonable that he go to the hospital and get IV antibiotics. I am not sure if oral antibiotics alone would take care of his cellulitis at this point.   Patient also has a h/o venous insufficiency which is probably contributing to the delay in resolution of cellulitis.  Patient is very frustrated and wants  to go to the hospital but wants to avoid a prolonged wait time in the ED and hence, direct admit process was started. Patient was accepted by Dr Florina Ou. Patient and his friend in agreement with the plan.   He can continue taking Linezolid 600mg  PO q12 in the meantime ( advised to avoid acetaminophem/oxycodone while taking Linezolid).  Rosiland Oz, [facility]  Office phone [phone] Fax no.  [phone] ______________________________________________________________________________________________________________________ Subjective Patient is here for follow up of LLE cellulitis He still has significant swelling of his Left leg. I donot see much improvement of his swelling from 10 days ago. I expect some improvement in his swelling by now. He still has significant pain in the left leg. Redness seems to be more or less the same. He has some skin peeling that has developed and has a small opening in the left lateral leg with crusted discharge. Denies any fever, chills, nausea, vomiting. I had called his this past weekend when he told me that his swelling seems to have improved and wanted to follow up on the CT and follow him up in the clinic. He has been elevating his  leg while sleeping. He is very frustrated and also illing to get IV abx. I also discussed with him regarding his venous insufficiency might be contributing his delay in resolution of his cellulitis.   Past Medical History:  Diagnosis Date  . Allergy    RHINITIS  . Arthritis    thumb  . GERD (gastroesophageal reflux disease)    "not really"  . History of echocardiogram 03/29/2009   EF 50-55%; mild concentric LVH;   . History of nuclear stress test 08/28/2007   moderate diaphragmatic attenuation; EKG neg for ischemia   . Hyperlipidemia   . Hypertension    takes Diovan and Toprol  . Obesity   . PAF (paroxysmal atrial fibrillation) (HCC)    associated with OSA  . Right bundle branch block   . Rosacea   . Shortness of breath    With activity  . Sleep apnea    CPAP  . Smoker   . Systolic murmur    Past Surgical History:  Procedure Laterality Date  . FINGER SURGERY     Reconstruction of Left thumb and L index finger.  Reconstruction   . KNEE ARTHROSCOPY    . SHOULDER CLOSED REDUCTION     Procedure: CLOSED REDUCTION SHOULDER;  Surgeon: Lawrence Santiago [employer] [facility];  Location: [place];  Service: Orthopedics;  Laterality:  Right;  . SHOULDER OPEN ROTATOR CUFF REPAIR  08/11/2011   Procedure: ROTATOR CUFF REPAIR SHOULDER OPEN;  Surgeon: Willa Frater III;  Location: [facility];  Service: Orthopedics;  Laterality: Right;  . TONSILLECTOMY     as a child   Current Outpatient Medications on File Prior to Visit  Medication Sig Dispense Refill  . ALPRAZolam (XANAX) 1 MG tablet TAKE 1 TABLET(1 MG) BY MOUTH AT BEDTIME AS NEEDED FOR SLEEP (Patient taking differently: Take 1 mg by mouth at bedtime as needed for anxiety or sleep. ) 30 tablet 0  . atorvastatin (LIPITOR) 10 MG tablet TAKE 1 TABLET BY MOUTH EVERY DAY AT 6 PM (Patient taking differently: Take 10 mg by mouth daily. ) 90 tablet 3  . diltiazem (CARDIZEM CD) 120 MG 24 hr capsule Take 1 capsule (120 mg total) by mouth daily. 90 capsule 3  . hydrocortisone 2.5 % cream Apply to external ears as needed for itching. 30 g 5  . methocarbamol (ROBAXIN) 500 MG tablet TAKE 1 TABLET(500 MG) BY MOUTH FOUR TIMES DAILY (Patient taking differently: Take 500 mg by mouth 4 (four) times daily as needed for muscle spasms. ) 30 tablet 1  . metoprolol succinate (TOPROL-XL) 50 MG 24 hr tablet Take with or immediately following a meal. (Patient taking differently: Take 50 mg by mouth daily. Take with or immediately following a meal.) 90 tablet 3  . oxyCODONE-acetaminophen (PERCOCET) 5-325 MG tablet Take 1 tablet by mouth every 4 (four) hours as needed for severe pain. 30 tablet 0  . rivaroxaban (XARELTO) 20 MG TABS tablet TAKE 1 TABLET(20 MG) BY MOUTH DAILY WITH SUPPER (Patient taking differently: Take 20 mg by mouth daily with supper. ) 90 tablet 3  . valsartan-hydrochlorothiazide (DIOVAN-HCT) 160-25 MG tablet Take 1 tablet by mouth daily. 90 tablet 3   No current facility-administered medications on file prior to visit.   Allergies  Allergen Reactions  . Codeine Shortness Of Breath and Swelling  . Morphine And Related Shortness Of Breath and Swelling   Social History   Socioeconomic  History  . Marital status: Single    Spouse name: Not on file  .  Number of children: Not on file  . Years of education: Not on file  . Highest education level: Not on file  Occupational History  . Not on file  Tobacco Use  . Smoking status: Former Smoker    Packs/day: 1.00    Years: 40.00    Pack years: 40.00    Types: Cigarettes    Quit date: 06/20/2016    Years since quitting: 3.9  . Smokeless tobacco: Never Used  Vaping Use  . Vaping Use: Never used  Substance and Sexual Activity  . Alcohol use: Yes    Alcohol/week: 11.0 standard drinks    Types: 6 Cans of beer, 5 Shots of liquor per week  . Drug use: No  . Sexual activity: Not Currently  Other Topics Concern  . Not on file  Social History Narrative  . Not on file   Social Determinants of Health   Financial Resource Strain:   . Difficulty of Paying Living Expenses: Not on file  Food Insecurity:   . Worried About Charity fundraiser in the Last Year: Not on file  . Ran Out of Food in the Last Year: Not on file  Transportation Needs:   . Lack of Transportation (Medical): Not on file  . Lack of Transportation (Non-Medical): Not on file  Physical Activity:   . Days of Exercise per Week: Not on file  . Minutes of Exercise per Session: Not on file  Stress:   . Feeling of Stress : Not on file  Social Connections:   . Frequency of Communication with Friends and Family: Not on file  . Frequency of Social Gatherings with Friends and Family: Not on file  . Attends Religious Services: Not on file  . Active Member of Clubs or Organizations: Not on file  . Attends Archivist Meetings: Not on file  . Marital Status: Not on file  Intimate Partner Violence:   . Fear of Current or Ex-Partner: Not on file  . Emotionally Abused: Not on file  . Physically Abused: Not on file  . Sexually Abused: Not on file   Vitals BP 114/71   Pulse 77   SpO2 96%   Examination  General - very frustrated, morbidly obese  HEENT -  PEERLA, no pallor and no icterus Chest - b/l clear air entry, no additional sounds CVS- Normal F0Y6, RRR, systolic murmur grade 3 Abdomen - Soft, Non tender , non distended,large abdomen Ext-  Left leg Still has significant swelling/warmth/tenderness erythema, not much improvement, peeling of skin+ Some crusted discharge in the Left lateral leg  Neuro: grossly normal Back - WNL Psych : calm and cooperative  Recent labs CBC Latest Ref Rng & Units 05/05/2020 04/26/2020 02/24/2019  WBC 4.0 - 10.5 K/uL 10.1 8.7 8.4  Hemoglobin 13.0 - 17.0 g/dL 12.9(L) 15.7 15.3  Hematocrit 39 - 52 % 38.9(L) 46.4 44.7  Platelets 150 - 400 K/uL 304 221 239   CMP Latest Ref Rng & Units 05/05/2020 04/26/2020 02/24/2019  Glucose 70 - 99 mg/dL 129(H) 95 102(H)  BUN 8 - 23 mg/dL 27(H) 17 16  Creatinine 0.61 - 1.24 mg/dL 1.13 1.13 1.15  Sodium 135 - 145 mmol/L 137 136 136  Potassium 3.5 - 5.1 mmol/L 3.9 4.1 4.5  Chloride 98 - 111 mmol/L 102 100 99  CO2 22 - 32 mmol/L 21(L) 22 24  Calcium 8.9 - 10.3 mg/dL 9.3 9.8 9.7  Total Protein 6.5 - 8.1 g/dL 7.8 8.2 7.4  Total Bilirubin 0.3 -  1.2 mg/dL 1.5(H) 1.0 0.8  Alkaline Phos 38 - 126 U/L 97 99 95  AST 15 - 41 U/L 30 19 18   ALT 0 - 44 U/L 52(H) 24 25    Pertinent Imaging Left LE Vascular US 05/06/20  RIGHT: - No evidence of common femoral vein obstruction. LEFT: - There is no evidence of deep vein thrombosis in the lower extremity. However, portions of this examination were limited- see technologist comments above. - No cystic structure found in the popliteal fossa.  All pertinent labs/Imagings/notes reviewed. All pertinent plain films and CT images have been personally visualized and interpreted; radiology reports have been reviewed. Decision making incorporated into the Impression / Recommendations.  CT Left Leg 05/19/20 FINDINGS: Diffuse and marked subcutaneous soft tissue swelling/edema/fluid and skin thickening all consistent with severe diffuse cellulitis.  I do not see a discrete rim enhancing fluid collection to suggest a drainable soft tissue abscess. Small knee joint effusion noted along with a small Baker's cyst. There are severe chronic posttraumatic degenerative changes involving the left knee.  No definite CT findings for myofasciitis or pyomyositis.  The ankle joint is grossly maintained. No findings suspicious for septic arthritis or osteomyelitis.  IMPRESSION: 1. Severe diffuse cellulitis involving the left lower extremity. No discrete rim enhancing fluid collection to suggest a drainable soft tissue abscess. 2. No CT findings suspicious for myofasciitis or pyomyositis. 3. No CT findings suspicious for septic arthritis or osteomyelitis. 4. Severe chronic posttraumatic degenerative changes involving the left knee.

## 2020-05-20 NOTE — H&P (Signed)
History and Physical    LINZIE [name] HFW:[id number] DOB: Jun 30, 1956 DOA: 05/20/2020  PCP: Denita Lung, MD    Patient coming from: ID specialists office.  Chief Complaint:  Left leg cellulitis.   HPI: David [name] is a 64 y.o. male with medical history significant of hypertension, paroxysmal atrial fibrillation, sleep apnea presenting to [place] as a direct admit from his ID specialist for unresolving left leg cellulitis since the past 3 to 4 weeks.pt is very emotional about this and has not eben getting better. He is crying and appreciate that I admitted him directly. He states that the his leg is hurting severely and esp in the back of his legs. CT scan ordered by pcp shows severe diffuse cellulitis without abscess or fascitis.  We will give him broad spectrum iv abx and monitor. ED Course: N/A Pt is a direct admit/    Review of Systems: As per HPI otherwise all systems reviewed and negative.  Past Medical History:  Diagnosis Date  . Allergy    RHINITIS  . Arthritis    thumb  . GERD (gastroesophageal reflux disease)    "not really"  . History of echocardiogram 03/29/2009   EF 50-55%; mild concentric LVH;   . History of nuclear stress test 08/28/2007   moderate diaphragmatic attenuation; EKG neg for ischemia   . Hyperlipidemia   . Hypertension    takes Diovan and Toprol  . Obesity   . PAF (paroxysmal atrial fibrillation) (HCC)    associated with OSA  . Right bundle branch block   . Rosacea   . Shortness of breath    With activity  . Sleep apnea    CPAP  . Smoker   . Systolic murmur     Past Surgical History:  Procedure Laterality Date  . FINGER SURGERY     Reconstruction of Left thumb and L index finger.  Reconstruction   . KNEE ARTHROSCOPY    . SHOULDER CLOSED REDUCTION     Procedure: CLOSED REDUCTION SHOULDER;  Surgeon: Lawrence Santiago [facility];  Location: [place];  Service: Orthopedics;  Laterality: Right;  . SHOULDER OPEN ROTATOR CUFF REPAIR  08/11/2011    Procedure: ROTATOR CUFF REPAIR SHOULDER OPEN;  Surgeon: Willa Frater III;  Location: [facility];  Service: Orthopedics;  Laterality: Right;  . TONSILLECTOMY     as a child     reports that he quit smoking about 3 years ago. His smoking use included cigarettes. He has a 40.00 pack-year smoking history. He has never used smokeless tobacco. He reports current alcohol use of about 11.0 standard drinks of alcohol per week. He reports that he does not use drugs.  Allergies  Allergen Reactions  . Codeine Shortness Of Breath and Swelling  . Morphine And Related Shortness Of Breath and Swelling    Family History  Problem Relation Age of Onset  . Heart disease Mother   . Cancer - Prostate Maternal Grandfather   . Cancer - Prostate Paternal Grandfather   . Cancer - Colon Father   . Heart attack Father 47  . Cancer Paternal Uncle   . Anesthesia problems Neg Hx   . Stroke Neg Hx     Prior to Admission medications   Medication Sig Start Date End Date Taking? Authorizing Provider  ALPRAZolam (XANAX) 1 MG tablet TAKE 1 TABLET(1 MG) BY MOUTH AT BEDTIME AS NEEDED FOR SLEEP Patient taking differently: Take 1 mg by mouth at bedtime as needed for anxiety or sleep.  02/03/20   Denita Lung, MD  atorvastatin (LIPITOR) 10 MG tablet TAKE 1 TABLET BY MOUTH EVERY DAY AT 6 PM Patient taking differently: Take 10 mg by mouth daily.  04/26/20   Denita Lung, MD  diltiazem (CARDIZEM CD) 120 MG 24 hr capsule Take 1 capsule (120 mg total) by mouth daily. 04/26/20   Denita Lung, MD  hydrocortisone 2.5 % cream Apply to external ears as needed for itching. 04/26/20   Denita Lung, MD  methocarbamol (ROBAXIN) 500 MG tablet TAKE 1 TABLET(500 MG) BY MOUTH FOUR TIMES DAILY Patient taking differently: Take 500 mg by mouth 4 (four) times daily as needed for muscle spasms.  12/02/19   Denita Lung, MD  metoprolol succinate (TOPROL-XL) 50 MG 24 hr tablet Take with or immediately following a meal. Patient taking  differently: Take 50 mg by mouth daily. Take with or immediately following a meal. 04/26/20   Denita Lung, MD  oxyCODONE-acetaminophen (PERCOCET) 5-325 MG tablet Take 1 tablet by mouth every 4 (four) hours as needed for severe pain. 05/13/20   Denita Lung, MD  rivaroxaban (XARELTO) 20 MG TABS tablet TAKE 1 TABLET(20 MG) BY MOUTH DAILY WITH SUPPER Patient taking differently: Take 20 mg by mouth daily with supper.  04/26/20   Denita Lung, MD  valsartan-hydrochlorothiazide (DIOVAN-HCT) 160-25 MG tablet Take 1 tablet by mouth daily. 04/26/20   Denita Lung, MD    Physical Exam: Vitals:   05/20/20 1455 05/20/20 1600  BP: 128/66   Pulse: 84   Resp: (!) 22   Temp: 98.2 F (36.8 C)   TempSrc: Oral   SpO2: 96%   Weight:  (!) 205 kg    Constitutional: NAD, calm, comfortable Vitals:   05/20/20 1455 05/20/20 1600  BP: 128/66   Pulse: 84   Resp: (!) 22   Temp: 98.2 F (36.8 C)   TempSrc: Oral   SpO2: 96%   Weight:  (!) 205 kg   Eyes: PERRL, EOMI lids and conjunctivae normal ENMT: Mucous membranes are moist. Posterior pharynx clear of any exudate or lesions.Normal dentition.  Neck: normal, supple, no masses, no thyromegaly, no carotid bruit  Respiratory: clear to auscultation bilaterally, no wheezing, no crackles. Normal respiratory effort. No accessory muscle use.  Cardiovascular: Regular rate and rhythm, no murmurs / rubs / gallops. No extremity edema. 2+ pedal pulses. No carotid bruits.  Abdomen: no tenderness, no masses palpated. No hepatosplenomegaly. Bowel sounds positive.  Musculoskeletal: no clubbing / cyanosis. No joint deformity upper and lower extremities. Pt moving all four ext , no contractures. Normal muscle tone.  Skin: no rashes, lesions, ulcers. No induration Neurologic: CN 2-12 grossly intact.  Psychiatric: Normal judgment and insight. Alert and oriented x 3. Normal mood.   Labs on Admission: I have personally reviewed following labs and imaging  studies  CBC: Recent Labs  Lab 05/20/20 1548  WBC 9.4  HGB 9.7*  HCT 29.4*  MCV 91.9  PLT 989*   Basic Metabolic Panel: Recent Labs  Lab 05/20/20 1548  CREATININE 1.23   GFR: Estimated Creatinine Clearance: 115.4 mL/min (by C-G formula based on SCr of 1.23 mg/dL). Liver Function Tests: No results for input(s): AST, ALT, ALKPHOS, BILITOT, PROT, ALBUMIN in the last 168 hours. No results for input(s): LIPASE, AMYLASE in the last 168 hours. No results for input(s): AMMONIA in the last 168 hours. Coagulation Profile: No results for input(s): INR, PROTIME in the last 168 hours. Cardiac Enzymes: No results for  input(s): CKTOTAL, CKMB, CKMBINDEX, TROPONINI in the last 168 hours. BNP (last 3 results) No results for input(s): PROBNP in the last 8760 hours. HbA1C: Recent Labs    05/20/20 1548  HGBA1C 5.8*   CBG: No results for input(s): GLUCAP in the last 168 hours. Lipid Profile: No results for input(s): CHOL, HDL, LDLCALC, TRIG, CHOLHDL, LDLDIRECT in the last 72 hours. Thyroid Function Tests: Recent Labs    05/20/20 1548  TSH 1.982  FREET4 0.95   Anemia Panel: No results for input(s): VITAMINB12, FOLATE, FERRITIN, TIBC, IRON, RETICCTPCT in the last 72 hours. Urine analysis:    Component Value Date/Time   COLORURINE YELLOW 05/05/2020 [place] 05/05/2020 1652   LABSPEC 1.020 05/05/2020 1652   LABSPEC 1.030 04/29/2020 1035   PHURINE 5.0 05/05/2020 1652   GLUCOSEU NEGATIVE 05/05/2020 1652   HGBUR SMALL (A) 05/05/2020 1652   BILIRUBINUR NEGATIVE 05/05/2020 1652   BILIRUBINUR small (A) 04/29/2020 1035   KETONESUR NEGATIVE 05/05/2020 1652   PROTEINUR NEGATIVE 05/05/2020 1652   NITRITE NEGATIVE 05/05/2020 1652   LEUKOCYTESUR NEGATIVE 05/05/2020 1652   No intake or output data in the 24 hours ending 05/20/20 1837 Lab Results  Component Value Date   CREATININE 1.23 05/20/2020   CREATININE 1.13 05/05/2020   CREATININE 1.13 04/26/2020    COVID-19  Labs  No results for input(s): DDIMER, FERRITIN, LDH, CRP in the last 72 hours.  Lab Results  Component Value Date   [place] Not Detected 04/29/2020    Radiological Exams on Admission: CT TIBIA FIBULA LEFT W CONTRAST  Result Date: 05/20/2020 CLINICAL DATA:  Diffuse soft tissue swelling involving the left lower extremity. EXAM: CT OF THE LOWER RIGHT EXTREMITY WITH CONTRAST TECHNIQUE: Multidetector CT imaging of the lower right extremity was performed according to the standard protocol following intravenous contrast administration. COMPARISON:  Knee radiographs 05/12/2013 CONTRAST:  145mL OMNIPAQUE IOHEXOL 300 MG/ML  SOLN FINDINGS: Diffuse and marked subcutaneous soft tissue swelling/edema/fluid and skin thickening all consistent with severe diffuse cellulitis. I do not see a discrete rim enhancing fluid collection to suggest a drainable soft tissue abscess. Small knee joint effusion noted along with a small Baker's cyst. There are severe chronic posttraumatic degenerative changes involving the left knee. No definite CT findings for myofasciitis or pyomyositis. The ankle joint is grossly maintained. No findings suspicious for septic arthritis or osteomyelitis. IMPRESSION: 1. Severe diffuse cellulitis involving the left lower extremity. No discrete rim enhancing fluid collection to suggest a drainable soft tissue abscess. 2. No CT findings suspicious for myofasciitis or pyomyositis. 3. No CT findings suspicious for septic arthritis or osteomyelitis. 4. Severe chronic posttraumatic degenerative changes involving the left knee. Electronically Signed   By: Marijo Sanes M.D.   On: 05/20/2020 10:02    Assessment/Plan Active Problems: Cellulitis and abscess of left leg: -Broad spectrum abx with vancomycin and cefepime with pharmacy consult to follow levels.  -last dose if vi abx was two weeks ago.  Hypertension: Continued on home regimen of 150 mg of Avapro along with HCTZ 25 mg and metoprolol 50  mg. Cardiac diet.  Persistent atrial fibrillation: Warfarin continued. Pharmacy consulted. Continued on home regimen of diltiazem, metoprolol milligrams.  Along with Xarelto 20 mg. BRBPR: Per chart review patient has had a positive Cologuard test with plan for GI referral on outpatient basis done on September 7 of 2021 Patient does report intermittent bright red blood per rectum since past few years.  Reports that he has never had a colonoscopy. Denies  any warning signs or alarm signs of weight loss or changes in stool consistency or color. Iv ppi/ anemia panel and type and screen. Attribute to patient's naproxen use for generalized muscular pain.  Advised patient to refrain from any and all NSAID use.  DVT prophylaxis:  Xarelto Code Status:  Full code   Family Communication:  None at bedside.   Disposition Plan:  Home   Consults called:  None.  Admission status: Inpatient    Status is: Inpatient  Remains inpatient appropriate because:Inpatient level of care appropriate due to severity of illness   Dispo: The patient is from: Home              Anticipated d/c is to: Home              Anticipated d/c date is: 3 days              Patient currently is not medically stable to d/c.  Para Skeans MD [facility] Pager [phone] If 7PM-7AM, please contact night-coverage [email or web address] Password [facility] 05/20/2020, 6:37 PM

## 2020-05-20 NOTE — Telephone Encounter (Signed)
Dr. West Bali called provider on call for admission to [facility], accepted by Dr. Posey Pronto. Called bed control at 10:33 to place patient on waiting list, patient will wait at home and is aware and is expecting call. Consuelo Pandy

## 2020-05-21 DIAGNOSIS — L02416 Cutaneous abscess of left lower limb: Secondary | ICD-10-CM

## 2020-05-21 DIAGNOSIS — L03116 Cellulitis of left lower limb: Principal | ICD-10-CM

## 2020-05-21 LAB — CBC
HCT: 30 % — ABNORMAL LOW (ref 39.0–52.0)
Hemoglobin: 9.6 g/dL — ABNORMAL LOW (ref 13.0–17.0)
MCH: 29.5 pg (ref 26.0–34.0)
MCHC: 32 g/dL (ref 30.0–36.0)
MCV: 92.3 fL (ref 80.0–100.0)
Platelets: 364 10*3/uL (ref 150–400)
RBC: 3.25 MIL/uL — ABNORMAL LOW (ref 4.22–5.81)
RDW: 14 % (ref 11.5–15.5)
WBC: 8.9 10*3/uL (ref 4.0–10.5)
nRBC: 0 % (ref 0.0–0.2)

## 2020-05-21 LAB — MAGNESIUM: Magnesium: 1.8 mg/dL (ref 1.7–2.4)

## 2020-05-21 LAB — COMPREHENSIVE METABOLIC PANEL
ALT: 43 U/L (ref 0–44)
AST: 40 U/L (ref 15–41)
Albumin: 2.3 g/dL — ABNORMAL LOW (ref 3.5–5.0)
Alkaline Phosphatase: 108 U/L (ref 38–126)
Anion gap: 13 (ref 5–15)
BUN: 15 mg/dL (ref 8–23)
CO2: 19 mmol/L — ABNORMAL LOW (ref 22–32)
Calcium: 8.3 mg/dL — ABNORMAL LOW (ref 8.9–10.3)
Chloride: 100 mmol/L (ref 98–111)
Creatinine, Ser: 1.3 mg/dL — ABNORMAL HIGH (ref 0.61–1.24)
GFR calc Af Amer: >60 mL/min (ref >60)
GFR calc non Af Amer: 58 mL/min — ABNORMAL LOW (ref >60)
Glucose, Bld: 88 mg/dL (ref 70–99)
Potassium: 4.3 mmol/L (ref 3.5–5.1)
Sodium: 132 mmol/L — ABNORMAL LOW (ref 135–145)
Total Bilirubin: 1.3 mg/dL — ABNORMAL HIGH (ref 0.3–1.2)
Total Protein: 6.8 g/dL (ref 6.5–8.1)

## 2020-05-21 LAB — CK: Total CK: 72 U/L (ref 49–397)

## 2020-05-21 LAB — PHOSPHORUS: Phosphorus: 3.6 mg/dL (ref 2.5–4.6)

## 2020-05-21 NOTE — Plan of Care (Signed)
  Problem: Clinical Measurements: Goal: Will remain free from infection Outcome: Progressing   Problem: Safety: Goal: Ability to remain free from injury will improve Outcome: Progressing   

## 2020-05-21 NOTE — Plan of Care (Signed)
  Problem: Clinical Measurements: Goal: Diagnostic test results will improve Outcome: Completed/Met

## 2020-05-21 NOTE — Progress Notes (Signed)
PROGRESS NOTE    David [name]  [id number]:[id number] DOB: February 11, 1956 DOA: 05/20/2020 PCP: Denita Lung, MD  Brief Narrative: 64/M with history of persistent atrial fibrillation on anticoagulation, history of embolic stroke, obstructive sleep apnea, hypertension, moderate aortic stenosis, RBBB, morbid obesity, chronic venous insufficiency with chronic left lower extremity edema, recently treated with doxycycline by his PCP for left leg cellulitis was seen in the emergency room on 9/17 and given IV dalbavancin, following this he follow-up with infectious disease yesterday 10/1, he was noted to have severe cellulitis of his left lower extremity and admitted to the hospital for further management   Assessment & Plan:   Severe left lower extremity cellulitis -Fortunately CT did not show evidence of underlying osteomyelitis abscess or myositis -Clinically improving, continue IV vancomycin and cefepime -Keep left leg elevated -Has chronic venous insufficiency, needs to lose weight and follow-up with vascular surgery -Follow-up blood cultures  Persistent atrial fibrillation History of embolic stroke -In sinus rhythm at this time, continue Cardizem, Xarelto and Lipitor  Obstructive sleep apnea -Continue CPAP nightly  Hypertension Moderate aortic stenosis -Continue Cardizem, ARB  Morbid obesity -BMI is 56.5 -Weight loss emphasized  DVT prophylaxis: Xarelto Code Status: Full code Family Communication: No family at bedside Disposition Plan:  Status is: Inpatient  Remains inpatient appropriate because:Inpatient level of care appropriate due to severity of illness   Dispo: The patient is from: Home              Anticipated d/c is to: Home              Anticipated d/c date is: 2 days              Patient currently is not medically stable to d/c.     Procedures:   Antimicrobials:    Subjective: -Feels better today, swelling and redness in his left leg is starting to improve less  tender Objective: Vitals:   05/20/20 2247 05/21/20 0339 05/21/20 0512 05/21/20 0939  BP: (!) 104/52 107/68 110/70 114/72  Pulse: 84 73 83 80  Resp: 14 16 20 20   Temp: 98.2 F (36.8 C) 97.9 F (36.6 C) 98.6 F (37 C) 98.6 F (37 C)  TempSrc: Oral Oral Oral Oral  SpO2: 97% 96% 97% 98%  Weight:        Intake/Output Summary (Last 24 hours) at 05/21/2020 1113 Last data filed at 05/21/2020 1000 Gross per 24 hour  Intake 733.63 ml  Output 600 ml  Net 133.63 ml   Filed Weights   05/20/20 1600  Weight: (!) 205 kg    Examination:  General exam: Morbidly obese pleasant male sitting up in bed, AAOx3, no distress Respiratory system: Clear bilaterally  cardiovascular system: S1 & S2 heard, RRR  Gastrointestinal system: Abdomen is nondistended, soft and nontender.Normal bowel sounds heard. Central nervous system: Alert and oriented. No focal neurological deficits. Extremities: 1-2+ left lower extremity edema, erythema tenderness and scaling, peeling skin Skin: As above Psychiatry:  Mood & affect appropriate.     Data Reviewed:   CBC: Recent Labs  Lab 05/20/20 1548 05/21/20 0707  WBC 9.4 8.9  HGB 9.7* 9.6*  HCT 29.4* 30.0*  MCV 91.9 92.3  PLT 404* 518   Basic Metabolic Panel: Recent Labs  Lab 05/20/20 1548 05/21/20 0503  NA  --  132*  K  --  4.3  CL  --  100  CO2  --  19*  GLUCOSE  --  88  BUN  --  15  CREATININE 1.23 1.30*  CALCIUM  --  8.3*  MG  --  1.8  PHOS  --  3.6   GFR: Estimated Creatinine Clearance: 109.2 mL/min (A) (by C-G formula based on SCr of 1.3 mg/dL (H)). Liver Function Tests: Recent Labs  Lab 05/21/20 0503  AST 40  ALT 43  ALKPHOS 108  BILITOT 1.3*  PROT 6.8  ALBUMIN 2.3*   No results for input(s): LIPASE, AMYLASE in the last 168 hours. No results for input(s): AMMONIA in the last 168 hours. Coagulation Profile: No results for input(s): INR, PROTIME in the last 168 hours. Cardiac Enzymes: Recent Labs  Lab 05/21/20 0503    CKTOTAL 72   BNP (last 3 results) No results for input(s): PROBNP in the last 8760 hours. HbA1C: Recent Labs    05/20/20 1548  HGBA1C 5.8*   CBG: No results for input(s): GLUCAP in the last 168 hours. Lipid Profile: No results for input(s): CHOL, HDL, LDLCALC, TRIG, CHOLHDL, LDLDIRECT in the last 72 hours. Thyroid Function Tests: Recent Labs    05/20/20 1548  TSH 1.982  FREET4 0.95   Anemia Panel: Recent Labs    05/20/20 2005  VITAMINB12 442  FOLATE 7.8  FERRITIN 392*  TIBC 234*  IRON 20*  RETICCTPCT 2.1   Urine analysis:    Component Value Date/Time   COLORURINE YELLOW 05/05/2020 1652   APPEARANCEUR CLEAR 05/05/2020 1652   LABSPEC 1.020 05/05/2020 1652   LABSPEC 1.030 04/29/2020 1035   PHURINE 5.0 05/05/2020 1652   GLUCOSEU NEGATIVE 05/05/2020 1652   HGBUR SMALL (A) 05/05/2020 1652   BILIRUBINUR NEGATIVE 05/05/2020 1652   BILIRUBINUR small (A) 04/29/2020 1035   KETONESUR NEGATIVE 05/05/2020 1652   PROTEINUR NEGATIVE 05/05/2020 1652   NITRITE NEGATIVE 05/05/2020 1652   LEUKOCYTESUR NEGATIVE 05/05/2020 1652   Sepsis Labs: @LABRCNTIP (procalcitonin:4,lacticidven:4)  ) Recent Results (from the past 240 hour(s))  Culture, blood (routine x 2)     Status: None (Preliminary result)   Collection Time: 05/20/20  4:34 PM   Specimen: BLOOD RIGHT HAND  Result Value Ref Range Status   Specimen Description BLOOD RIGHT HAND  Final   Special Requests   Final    BOTTLES DRAWN AEROBIC ONLY Blood Culture results may not be optimal due to an inadequate volume of blood received in culture bottles   Culture   Final    NO GROWTH < 12 HOURS Performed at [facility] [address] [place], [place] [address]    Report Status PENDING  Incomplete  Culture, blood (routine x 2)     Status: None (Preliminary result)   Collection Time: 05/20/20  4:39 PM   Specimen: BLOOD RIGHT HAND  Result Value Ref Range Status   Specimen Description BLOOD RIGHT HAND  Final   Special  Requests   Final    BOTTLES DRAWN AEROBIC ONLY Blood Culture results may not be optimal due to an inadequate volume of blood received in culture bottles   Culture   Final    NO GROWTH < 12 HOURS Performed at [facility], [place] [address] [place], [place] [address]    Report Status PENDING  Incomplete  Respiratory Panel by RT PCR (Flu A&B, Covid) - Nasopharyngeal Swab     Status: None   Collection Time: 05/20/20  6:06 PM   Specimen: Nasopharyngeal Swab  Result Value Ref Range Status   SARS Coronavirus 2 by RT PCR NEGATIVE NEGATIVE Final    Comment: (NOTE) SARS-CoV-2 target  nucleic acids are NOT DETECTED.  The SARS-CoV-2 RNA is generally detectable in upper respiratoy specimens during the acute phase of infection. The lowest concentration of SARS-CoV-2 viral copies this assay can detect is 131 copies/mL. A negative result does not preclude SARS-Cov-2 infection and should not be used as the sole basis for treatment or other patient management decisions. A negative result may occur with  improper specimen collection/handling, submission of specimen other than nasopharyngeal swab, presence of viral mutation(s) within the areas targeted by this assay, and inadequate number of viral copies (<131 copies/mL). A negative result must be combined with clinical observations, patient history, and epidemiological information. The expected result is Negative.  Fact Sheet for Patients:  PinkCheek.be  Fact Sheet for Healthcare Providers:  GravelBags.it  This test is no t yet approved or cleared by the [place] FDA and  has been authorized for detection and/or diagnosis of SARS-CoV-2 by FDA under an Emergency Use Authorization (EUA). This EUA will remain  in effect (meaning this test can be used) for the duration of the COVID-19 declaration under Section 564(b)(1) of the Act, 21 U.S.C. section 360bbb-3(b)(1), unless the  authorization is terminated or revoked sooner.     Influenza A by PCR NEGATIVE NEGATIVE Final   Influenza B by PCR NEGATIVE NEGATIVE Final    Comment: (NOTE) The Xpert Xpress SARS-CoV-2/FLU/RSV assay is intended as an aid in  the diagnosis of influenza from Nasopharyngeal swab specimens and  should not be used as a sole basis for treatment. Nasal washings and  aspirates are unacceptable for Xpert Xpress SARS-CoV-2/FLU/RSV  testing.  Fact Sheet for Patients: PinkCheek.be  Fact Sheet for Healthcare Providers: GravelBags.it  This test is not yet approved or cleared by the [place] FDA and  has been authorized for detection and/or diagnosis of SARS-CoV-2 by  FDA under an Emergency Use Authorization (EUA). This EUA will remain  in effect (meaning this test can be used) for the duration of the  Covid-19 declaration under Section 564(b)(1) of the Act, 21  U.S.C. section 360bbb-3(b)(1), unless the authorization is  terminated or revoked. Performed at [facility], [place] [address], [place], [place] [address]          Radiology Studies: CT TIBIA FIBULA LEFT W CONTRAST  Result Date: 05/20/2020 CLINICAL DATA:  Diffuse soft tissue swelling involving the left lower extremity. EXAM: CT OF THE LOWER RIGHT EXTREMITY WITH CONTRAST TECHNIQUE: Multidetector CT imaging of the lower right extremity was performed according to the standard protocol following intravenous contrast administration. COMPARISON:  Knee radiographs 05/12/2013 CONTRAST:  158mL OMNIPAQUE IOHEXOL 300 MG/ML  SOLN FINDINGS: Diffuse and marked subcutaneous soft tissue swelling/edema/fluid and skin thickening all consistent with severe diffuse cellulitis. I do not see a discrete rim enhancing fluid collection to suggest a drainable soft tissue abscess. Small knee joint effusion noted along with a small Baker's cyst. There are severe chronic posttraumatic degenerative  changes involving the left knee. No definite CT findings for myofasciitis or pyomyositis. The ankle joint is grossly maintained. No findings suspicious for septic arthritis or osteomyelitis. IMPRESSION: 1. Severe diffuse cellulitis involving the left lower extremity. No discrete rim enhancing fluid collection to suggest a drainable soft tissue abscess. 2. No CT findings suspicious for myofasciitis or pyomyositis. 3. No CT findings suspicious for septic arthritis or osteomyelitis. 4. Severe chronic posttraumatic degenerative changes involving the left knee. Electronically Signed   By: Marijo Sanes M.D.   On: 05/20/2020 10:02  Scheduled Meds: . atorvastatin  10 mg Oral q1800  . diltiazem  120 mg Oral Daily  . irbesartan  150 mg Oral Daily   And  . hydrochlorothiazide  25 mg Oral Daily  . metoprolol succinate  50 mg Oral Daily  . pantoprazole (PROTONIX) IV  40 mg Intravenous Q24H  . rivaroxaban  20 mg Oral Q supper   Continuous Infusions: . sodium chloride 10 mL/hr at 05/21/20 1000  . ceFEPime (MAXIPIME) IV Stopped (05/21/20 0903)  . vancomycin Stopped (05/21/20 0801)     LOS: 1 day    Time spent: 40min    Domenic Polite, MD [facility] 05/21/2020, 11:13 AM

## 2020-05-21 NOTE — Progress Notes (Signed)
   05/21/20 0512  Vitals  Temp 98.6 F (37 C)  Temp Source Oral  BP 110/70  BP Location Left Arm  BP Method Automatic  Patient Position (if appropriate) Lying  Pulse Rate 83  Pulse Rate Source Monitor  Resp 20  Level of Consciousness  Level of Consciousness Alert  Oxygen Therapy  SpO2 97 %  O2 Device Room Air  Pain Assessment  Pain Scale 0-10  Pain Score 0  Had a seven beat run of VT. Pt is asymptomatic. Will update MD

## 2020-05-22 LAB — CBC
HCT: 30.3 % — ABNORMAL LOW (ref 39.0–52.0)
Hemoglobin: 9.8 g/dL — ABNORMAL LOW (ref 13.0–17.0)
MCH: 30.4 pg (ref 26.0–34.0)
MCHC: 32.3 g/dL (ref 30.0–36.0)
MCV: 94.1 fL (ref 80.0–100.0)
Platelets: 397 10*3/uL (ref 150–400)
RBC: 3.22 MIL/uL — ABNORMAL LOW (ref 4.22–5.81)
RDW: 14.1 % (ref 11.5–15.5)
WBC: 10.1 10*3/uL (ref 4.0–10.5)
nRBC: 0 % (ref 0.0–0.2)

## 2020-05-22 LAB — BASIC METABOLIC PANEL
Anion gap: 8 (ref 5–15)
BUN: 16 mg/dL (ref 8–23)
CO2: 25 mmol/L (ref 22–32)
Calcium: 8.3 mg/dL — ABNORMAL LOW (ref 8.9–10.3)
Chloride: 101 mmol/L (ref 98–111)
Creatinine, Ser: 1.35 mg/dL — ABNORMAL HIGH (ref 0.61–1.24)
GFR calc Af Amer: >60 mL/min (ref >60)
GFR calc non Af Amer: 55 mL/min — ABNORMAL LOW (ref >60)
Glucose, Bld: 95 mg/dL (ref 70–99)
Potassium: 4.4 mmol/L (ref 3.5–5.1)
Sodium: 134 mmol/L — ABNORMAL LOW (ref 135–145)

## 2020-05-22 NOTE — Progress Notes (Signed)
PROGRESS NOTE    David [name]  XIP:[id number] DOB: [birth year] DOA: 05/20/2020 PCP: Denita Lung, MD  Brief Narrative: 64/M with history of persistent atrial fibrillation on anticoagulation, history of embolic stroke, obstructive sleep apnea, hypertension, moderate aortic stenosis, RBBB, morbid obesity, chronic venous insufficiency with chronic left lower extremity edema, recently treated with doxycycline by his PCP for left leg cellulitis was seen in the emergency room on 9/17 and given IV dalbavancin, following this he follow-up with infectious disease yesterday 10/1, he was noted to have severe cellulitis of his left lower extremity and admitted to the hospital for further management   Assessment & Plan:   Severe left lower extremity cellulitis -Fortunately CT did not show evidence of underlying osteomyelitis abscess or myositis -Continues to improve clinically, continue IV vancomycin and cefepime day 3 today, blood cultures are negative -continue left leg elevation -Has chronic venous insufficiency, needs to lose weight and follow-up with vascular surgery -Increase activity and ambulation -Discharge planning  Persistent atrial fibrillation History of embolic stroke -In sinus rhythm at this time, continue Cardizem, Xarelto and Lipitor  Obstructive sleep apnea -Continue CPAP nightly  Hypertension Moderate aortic stenosis -Continue Cardizem, ARB  Morbid obesity -BMI is 56.5 -Weight loss emphasized  DVT prophylaxis: Xarelto Code Status: Full code Family Communication: No family at bedside Disposition Plan:  Status is: Inpatient  Remains inpatient appropriate because:Inpatient level of care appropriate due to severity of illness   Dispo: The patient is from: Home              Anticipated d/c is to: Home              Anticipated d/c date is: Tomorrow if continues to improve              Patient currently is not medically stable to d/c.     Procedures:    Antimicrobials:    Subjective: -Did not get much sleep last night, pain and discomfort in his left leg continues to improve  Objective: Vitals:   05/21/20 2132 05/22/20 0520 05/22/20 0825 05/22/20 0943  BP: 107/63 (!) 97/44 (!) 107/50 (!) 114/55  Pulse: 75 79  82  Resp:  18  18  Temp: 99 F (37.2 C) 98.5 F (36.9 C)  98.6 F (37 C)  TempSrc: Oral Oral  Oral  SpO2: 96% 99%  99%  Weight:        Intake/Output Summary (Last 24 hours) at 05/22/2020 1151 Last data filed at 05/22/2020 6734 Gross per 24 hour  Intake 1474.19 ml  Output 1745 ml  Net -270.81 ml   Filed Weights   05/20/20 1600  Weight: (!) 205 kg    Examination:  General exam: Morbidly obese pleasant male sitting up in bed, AAO x3, no distress CVS: S1-S2, regular rate rhythm Lungs: Clear bilaterally Abdomen: Soft, nontender, bowel sounds present Extremities: 1-2+ left lower extremity edema, improving erythema, scaling/peeling skin  Skin: As above Psychiatry:  Mood & affect appropriate.     Data Reviewed:   CBC: Recent Labs  Lab 05/20/20 1548 05/21/20 0707 05/22/20 0057  WBC 9.4 8.9 10.1  HGB 9.7* 9.6* 9.8*  HCT 29.4* 30.0* 30.3*  MCV 91.9 92.3 94.1  PLT 404* 364 193   Basic Metabolic Panel: Recent Labs  Lab 05/20/20 1548 05/21/20 0503 05/22/20 0057  NA  --  132* 134*  K  --  4.3 4.4  CL  --  100 101  CO2  --  19* 25  GLUCOSE  --  88 95  BUN  --  15 16  CREATININE 1.23 1.30* 1.35*  CALCIUM  --  8.3* 8.3*  MG  --  1.8  --   PHOS  --  3.6  --    GFR: Estimated Creatinine Clearance: 105.1 mL/min (A) (by C-G formula based on SCr of 1.35 mg/dL (H)). Liver Function Tests: Recent Labs  Lab 05/21/20 0503  AST 40  ALT 43  ALKPHOS 108  BILITOT 1.3*  PROT 6.8  ALBUMIN 2.3*   No results for input(s): LIPASE, AMYLASE in the last 168 hours. No results for input(s): AMMONIA in the last 168 hours. Coagulation Profile: No results for input(s): INR, PROTIME in the last 168  hours. Cardiac Enzymes: Recent Labs  Lab 05/21/20 0503  CKTOTAL 72   BNP (last 3 results) No results for input(s): PROBNP in the last 8760 hours. HbA1C: Recent Labs    05/20/20 1548  HGBA1C 5.8*   CBG: No results for input(s): GLUCAP in the last 168 hours. Lipid Profile: No results for input(s): CHOL, HDL, LDLCALC, TRIG, CHOLHDL, LDLDIRECT in the last 72 hours. Thyroid Function Tests: Recent Labs    05/20/20 1548  TSH 1.982  FREET4 0.95   Anemia Panel: Recent Labs    05/20/20 2005  VITAMINB12 442  FOLATE 7.8  FERRITIN 392*  TIBC 234*  IRON 20*  RETICCTPCT 2.1   Urine analysis:    Component Value Date/Time   COLORURINE YELLOW 05/05/2020 1652   APPEARANCEUR CLEAR 05/05/2020 1652   LABSPEC 1.020 05/05/2020 1652   LABSPEC 1.030 04/29/2020 1035   PHURINE 5.0 05/05/2020 1652   GLUCOSEU NEGATIVE 05/05/2020 1652   HGBUR SMALL (A) 05/05/2020 1652   BILIRUBINUR NEGATIVE 05/05/2020 1652   BILIRUBINUR small (A) 04/29/2020 1035   KETONESUR NEGATIVE 05/05/2020 1652   PROTEINUR NEGATIVE 05/05/2020 1652   NITRITE NEGATIVE 05/05/2020 1652   LEUKOCYTESUR NEGATIVE 05/05/2020 1652   Sepsis Labs: @LABRCNTIP (procalcitonin:4,lacticidven:4)  ) Recent Results (from the past 240 hour(s))  Culture, blood (routine x 2)     Status: None (Preliminary result)   Collection Time: 05/20/20  4:34 PM   Specimen: BLOOD RIGHT HAND  Result Value Ref Range Status   Specimen Description BLOOD RIGHT HAND  Final   Special Requests   Final    BOTTLES DRAWN AEROBIC ONLY Blood Culture results may not be optimal due to an inadequate volume of blood received in culture bottles   Culture   Final    NO GROWTH 2 DAYS Performed at [facility], [place] [address], [place], [place] [address]    Report Status PENDING  Incomplete  Culture, blood (routine x 2)     Status: None (Preliminary result)   Collection Time: 05/20/20  4:39 PM   Specimen: BLOOD RIGHT HAND  Result Value Ref Range Status    Specimen Description BLOOD RIGHT HAND  Final   Special Requests   Final    BOTTLES DRAWN AEROBIC ONLY Blood Culture results may not be optimal due to an inadequate volume of blood received in culture bottles   Culture   Final    NO GROWTH 2 DAYS Performed at [facility], [place] [address], [place], [place] [address]    Report Status PENDING  Incomplete  Respiratory Panel by RT PCR (Flu A&B, Covid) - Nasopharyngeal Swab     Status: None   Collection Time: 05/20/20  6:06 PM   Specimen: Nasopharyngeal Swab  Result Value Ref Range Status   SARS Coronavirus 2  by RT PCR NEGATIVE NEGATIVE Final    Comment: (NOTE) SARS-CoV-2 target nucleic acids are NOT DETECTED.  The SARS-CoV-2 RNA is generally detectable in upper respiratoy specimens during the acute phase of infection. The lowest concentration of SARS-CoV-2 viral copies this assay can detect is 131 copies/mL. A negative result does not preclude SARS-Cov-2 infection and should not be used as the sole basis for treatment or other patient management decisions. A negative result may occur with  improper specimen collection/handling, submission of specimen other than nasopharyngeal swab, presence of viral mutation(s) within the areas targeted by this assay, and inadequate number of viral copies (<131 copies/mL). A negative result must be combined with clinical observations, patient history, and epidemiological information. The expected result is Negative.  Fact Sheet for Patients:  PinkCheek.be  Fact Sheet for Healthcare Providers:  GravelBags.it  This test is no t yet approved or cleared by the [place] FDA and  has been authorized for detection and/or diagnosis of SARS-CoV-2 by FDA under an Emergency Use Authorization (EUA). This EUA will remain  in effect (meaning this test can be used) for the duration of the COVID-19 declaration under Section 564(b)(1) of the Act, 21  U.S.C. section 360bbb-3(b)(1), unless the authorization is terminated or revoked sooner.     Influenza A by PCR NEGATIVE NEGATIVE Final   Influenza B by PCR NEGATIVE NEGATIVE Final    Comment: (NOTE) The Xpert Xpress SARS-CoV-2/FLU/RSV assay is intended as an aid in  the diagnosis of influenza from Nasopharyngeal swab specimens and  should not be used as a sole basis for treatment. Nasal washings and  aspirates are unacceptable for Xpert Xpress SARS-CoV-2/FLU/RSV  testing.  Fact Sheet for Patients: PinkCheek.be  Fact Sheet for Healthcare Providers: GravelBags.it  This test is not yet approved or cleared by the [place] FDA and  has been authorized for detection and/or diagnosis of SARS-CoV-2 by  FDA under an Emergency Use Authorization (EUA). This EUA will remain  in effect (meaning this test can be used) for the duration of the  Covid-19 declaration under Section 564(b)(1) of the Act, 21  U.S.C. section 360bbb-3(b)(1), unless the authorization is  terminated or revoked. Performed at [facility], [place] [address] [place], [place] [address]          Radiology Studies: No results found.      Scheduled Meds: . atorvastatin  10 mg Oral q1800  . diltiazem  120 mg Oral Daily  . irbesartan  150 mg Oral Daily   And  . hydrochlorothiazide  25 mg Oral Daily  . metoprolol succinate  50 mg Oral Daily  . pantoprazole (PROTONIX) IV  40 mg Intravenous Q24H  . rivaroxaban  20 mg Oral Q supper   Continuous Infusions: . sodium chloride Stopped (05/21/20 1826)  . ceFEPime (MAXIPIME) IV 2 g (05/22/20 0907)  . vancomycin 1,500 mg (05/22/20 0647)     LOS: 2 days    Time spent: 6min    Domenic Polite, MD [facility] 05/22/2020, 11:51 AM

## 2020-05-22 NOTE — Plan of Care (Signed)
  Problem: Pain Managment: Goal: General experience of comfort will improve Outcome: Progressing   

## 2020-05-23 ENCOUNTER — Other Ambulatory Visit: Payer: Self-pay

## 2020-05-23 ENCOUNTER — Encounter (HOSPITAL_COMMUNITY): Payer: Self-pay | Admitting: Internal Medicine

## 2020-05-23 LAB — BASIC METABOLIC PANEL
Anion gap: 9 (ref 5–15)
BUN: 14 mg/dL (ref 8–23)
CO2: 23 mmol/L (ref 22–32)
Calcium: 8.6 mg/dL — ABNORMAL LOW (ref 8.9–10.3)
Chloride: 99 mmol/L (ref 98–111)
Creatinine, Ser: 1.26 mg/dL — ABNORMAL HIGH (ref 0.61–1.24)
GFR calc Af Amer: >60 mL/min (ref >60)
GFR calc non Af Amer: >60 mL/min (ref >60)
Glucose, Bld: 98 mg/dL (ref 70–99)
Potassium: 3.9 mmol/L (ref 3.5–5.1)
Sodium: 131 mmol/L — ABNORMAL LOW (ref 135–145)

## 2020-05-23 LAB — VANCOMYCIN, TROUGH: Vancomycin Tr: 14 ug/mL — ABNORMAL LOW (ref 15–20)

## 2020-05-23 MED ORDER — CEPHALEXIN 250 MG PO CAPS: 250.0000 mg | ORAL_CAPSULE | Freq: Three times a day (TID) | ORAL | 0 refills | Status: AC

## 2020-05-23 MED ORDER — FERROUS SULFATE 325 (65 FE) MG PO TBEC: 325.0000 mg | DELAYED_RELEASE_TABLET | Freq: Two times a day (BID) | ORAL | Status: DC

## 2020-05-23 MED ORDER — DOXYCYCLINE HYCLATE 100 MG PO TBEC: 100.0000 mg | DELAYED_RELEASE_TABLET | Freq: Two times a day (BID) | ORAL | 0 refills | Status: DC

## 2020-05-23 MED ORDER — VALSARTAN-HYDROCHLOROTHIAZIDE 160-25 MG PO TABS: 0.5000 | ORAL_TABLET | Freq: Every day | ORAL | Status: DC

## 2020-05-23 NOTE — Discharge Summary (Signed)
Physician Discharge Summary  David [name] EYC:[id number] DOB: 09-May-1956 DOA: 05/20/2020  PCP: Denita Lung, MD  Admit date: 05/20/2020 Discharge date: 05/23/2020  Time spent: 35 minutes  Recommendations for Outpatient Follow-up:  1. PCP in 1 week 2. Vascular surgery follow-up for chronic venous insufficiency and chronic left leg edema 3. Needs a colonoscopy for iron deficiency anemia   Discharge Diagnoses:  Active Problems:   Cellulitis of left leg Persistent atrial fibrillation History of embolic stroke Obstructive sleep apnea Moderate aortic stenosis Morbid obesity Chronic venous insufficiency Chronic left leg edema Iron deficiency anemia  Discharge Condition: Improved, stable  Diet recommendation: Low-sodium, carb modified  Filed Weights   05/20/20 1600  Weight: (!) 205 kg    History of present illness:  64/M with history of persistent atrial fibrillation on anticoagulation, history of embolic stroke, obstructive sleep apnea, hypertension, moderate aortic stenosis, RBBB, morbid obesity, chronic venous insufficiency with chronic left lower extremity edema, recently treated with doxycycline by his PCP for left leg cellulitis was seen in the emergency room on 9/17 and given IV dalbavancin, following this he follow-up with infectious disease yesterday 10/1, he was noted to have severe cellulitis of his left lower extremity and admitted to the hospital for further management  Hospital Course:   Severe left lower extremity cellulitis -Fortunately CT did not show evidence of underlying osteomyelitis abscess or myositis -Was treated with oral Keflex prior to admission and with IV dalbavancin on 9/17 in the emergency room  -Worsened after brief clinical improvement, admitted to the hospital and treated with IV vancomycin and cefepime  -Cultures are negative, improving clinically  -Recommended to elevate left leg,  -Also has chronic venous insufficiency and chronic left leg  edema, needs to lose weight and follow-up with vascular surgery -Discharged home on oral Keflex today -Recommended follow-up with PCP in 1 week  Persistent atrial fibrillation History of embolic stroke -In sinus rhythm at this time, continue Cardizem, Xarelto and Lipitor  Obstructive sleep apnea -Continue CPAP nightly  Hypertension Moderate aortic stenosis -Continue Cardizem, ARB/HCT -Blood pressure in the mid 90s to low 100s through the hospitalization, recommended to take half dose of ARB/HCT at discharge for few days and then gradually increase to 1 tab if pressures remain above 120  Morbid obesity -BMI is 56.5 -Patient is working on and motivated to lose weight  Borderline diabetes mellitus -Hemoglobin A1c is 5.8, weight loss emphasized  Discharge Exam: Vitals:   05/23/20 0459 05/23/20 0841  BP: (!) 94/40 (!) 97/52  Pulse: 83 85  Resp: 14 18  Temp:  98.4 F (36.9 C)  SpO2: 97% 97%    General: AAOx3 Cardiovascular: S1-S2, regular rate rhythm Respiratory: Clear  Discharge Instructions   Discharge Instructions    Diet - low sodium heart healthy   Complete by: As directed    Diet Carb Modified   Complete by: As directed    Increase activity slowly   Complete by: As directed      Allergies as of 05/23/2020      Reactions   Codeine Shortness Of Breath, Swelling   Morphine And Related Shortness Of Breath, Swelling      Medication List    TAKE these medications   ALPRAZolam 1 MG tablet Commonly known as: XANAX TAKE 1 TABLET(1 MG) BY MOUTH AT BEDTIME AS NEEDED FOR SLEEP What changed: See the new instructions.   atorvastatin 10 MG tablet Commonly known as: LIPITOR TAKE 1 TABLET BY MOUTH EVERY DAY AT 6 PM What  changed:   how much to take  how to take this  when to take this  additional instructions   cephALEXin 250 MG capsule Commonly known as: KEFLEX Take 1 capsule (250 mg total) by mouth 3 (three) times daily for 5 days.   diltiazem 120  MG 24 hr capsule Commonly known as: CARDIZEM CD Take 1 capsule (120 mg total) by mouth daily.   ferrous sulfate 325 (65 FE) MG EC tablet Take 1 tablet (325 mg total) by mouth 2 (two) times daily with a meal.   hydrocortisone 2.5 % cream Apply to external ears as needed for itching. What changed:   how much to take  how to take this  when to take this  reasons to take this  additional instructions   methocarbamol 500 MG tablet Commonly known as: ROBAXIN TAKE 1 TABLET(500 MG) BY MOUTH FOUR TIMES DAILY What changed: See the new instructions.   metoprolol succinate 50 MG 24 hr tablet Commonly known as: TOPROL-XL Take with or immediately following a meal. What changed:   how much to take  how to take this  when to take this   oxyCODONE-acetaminophen 5-325 MG tablet Commonly known as: Percocet Take 1 tablet by mouth every 4 (four) hours as needed for severe pain.   rivaroxaban 20 MG Tabs tablet Commonly known as: Xarelto TAKE 1 TABLET(20 MG) BY MOUTH DAILY WITH SUPPER What changed:   how much to take  how to take this  when to take this  additional instructions   valsartan-hydrochlorothiazide 160-25 MG tablet Commonly known as: DIOVAN-HCT Take 0.5 tablets by mouth daily. Take half tab daily for the next 3 days and if blood pressure sustains above 120 can start taking 1 tablet daily after that What changed:   how much to take  additional instructions            Durable Medical Equipment  (From admission, onward)         Start     Ordered   05/23/20 1053  For home use only DME 3 n 1  Once       Comments: Bariatric - heavy duty needed.  Patient with BMI of 56   05/23/20 1053   05/23/20 1052  For home use only DME Walker rolling  Once       Comments: Bariatric walker needed - BMI 56  Question Answer Comment  Walker: With [place]eral   Patient needs a walker to treat with the following condition Septic arthritis ([place])      05/23/20 1053          Allergies  Allergen Reactions  . Codeine Shortness Of Breath and Swelling  . Morphine And Related Shortness Of Breath and Swelling    Follow-up Information    [facility] Follow up.   Why: A bariatric rolling walker and 3:1 are being delivered to your room prior to your discharge home today. Contact information: [address] [place] [address] [phone]        Denita Lung, MD. Schedule an appointment as soon as possible for a visit.   Specialty: Family Medicine Why: Please followup with your primary care physican in the next 7-10 days. Contact information: [address] [address] [phone]                The results of significant diagnostics from this hospitalization (including imaging, microbiology, ancillary and laboratory) are listed below for reference.  Significant Diagnostic Studies: CT Angio Head W or Wo Contrast  Result Date: 05/06/2020 CLINICAL DATA:  TIA. Left lower extremity weakness, redness and swelling. EXAM: CT ANGIOGRAPHY HEAD AND NECK TECHNIQUE: Multidetector CT imaging of the head and neck was performed using the standard protocol during bolus administration of intravenous contrast. Multiplanar CT image reconstructions and MIPs were obtained to evaluate the vascular anatomy. Carotid stenosis measurements (when applicable) are obtained utilizing NASCET criteria, using the distal internal carotid diameter as the denominator. CONTRAST:  18mL OMNIPAQUE IOHEXOL 350 MG/ML SOLN COMPARISON:  MRI same day FINDINGS: CT HEAD FINDINGS Brain: No acute finding. Old white matter ischemic change in the right frontal lobe. The remainder the brain appears normal by CT. No mass, hemorrhage, hydrocephalus or extra-axial collection. Vascular: No abnormal vascular finding. Skull: Normal Sinuses: Left maxillary, ethmoid and frontal inflammatory disease. Orbits: Negative Review of the MIP images confirms the above  findings CTA NECK FINDINGS Aortic arch: Aortic atherosclerosis.  Branching pattern is normal. Right carotid system: Common carotid artery widely patent to the bifurcation. Calcified plaque at the carotid bifurcation and ICA bulb but no stenosis. Cervical ICA widely patent. Left carotid system: Common carotid artery widely patent to the bifurcation. Minimal plaque at the carotid bifurcation without stenosis. Cervical ICA widely patent. Vertebral arteries: Right vertebral artery is dominant. Right vertebral artery origin widely patent. Right vertebral artery widely patent through the cervical region to the foramen magnum. Left vertebral artery is very diminutive. Small amount of flow in the tiny left vertebral artery. Skeleton: Mid cervical spondylosis.  No acute bone finding. Other neck: No neck mass or lymphadenopathy. Upper chest: Emphysema and pulmonary scarring. Review of the MIP images confirms the above findings CTA HEAD FINDINGS Anterior circulation: Both internal carotid arteries widely patent through the skull base and siphon regions. Anterior and middle cerebral vessels are patent. No large or medium vessel occlusion. No correctable proximal stenosis. Posterior circulation: Both vertebral arteries patent to the basilar. The right is dominant. The left is a tiny vessel. Posterior circulation branch vessels are normal. Patent posterior communicating arteries on both sides. Venous sinuses: Patent and normal. Anatomic variants: None significant otherwise. Review of the MIP images confirms the above findings IMPRESSION: 1. Mild atherosclerotic change at both carotid bifurcations but no stenosis. 2. No intracranial large or medium vessel occlusion or correctable proximal stenosis. 3. Emphysema and aortic atherosclerosis. Aortic Atherosclerosis (ICD10-I70.0) and Emphysema (ICD10-J43.9). Electronically Signed   By: Nelson Chimes M.D.   On: 05/06/2020 12:55   CT Angio Neck W and/or Wo Contrast  Result Date:  05/06/2020 CLINICAL DATA:  TIA. Left lower extremity weakness, redness and swelling. EXAM: CT ANGIOGRAPHY HEAD AND NECK TECHNIQUE: Multidetector CT imaging of the head and neck was performed using the standard protocol during bolus administration of intravenous contrast. Multiplanar CT image reconstructions and MIPs were obtained to evaluate the vascular anatomy. Carotid stenosis measurements (when applicable) are obtained utilizing NASCET criteria, using the distal internal carotid diameter as the denominator. CONTRAST:  76mL OMNIPAQUE IOHEXOL 350 MG/ML SOLN COMPARISON:  MRI same day FINDINGS: CT HEAD FINDINGS Brain: No acute finding. Old white matter ischemic change in the right frontal lobe. The remainder the brain appears normal by CT. No mass, hemorrhage, hydrocephalus or extra-axial collection. Vascular: No abnormal vascular finding. Skull: Normal Sinuses: Left maxillary, ethmoid and frontal inflammatory disease. Orbits: Negative Review of the MIP images confirms the above findings CTA NECK FINDINGS Aortic arch: Aortic atherosclerosis.  Branching pattern is normal. Right carotid system: Common carotid  artery widely patent to the bifurcation. Calcified plaque at the carotid bifurcation and ICA bulb but no stenosis. Cervical ICA widely patent. Left carotid system: Common carotid artery widely patent to the bifurcation. Minimal plaque at the carotid bifurcation without stenosis. Cervical ICA widely patent. Vertebral arteries: Right vertebral artery is dominant. Right vertebral artery origin widely patent. Right vertebral artery widely patent through the cervical region to the foramen magnum. Left vertebral artery is very diminutive. Small amount of flow in the tiny left vertebral artery. Skeleton: Mid cervical spondylosis.  No acute bone finding. Other neck: No neck mass or lymphadenopathy. Upper chest: Emphysema and pulmonary scarring. Review of the MIP images confirms the above findings CTA HEAD FINDINGS  Anterior circulation: Both internal carotid arteries widely patent through the skull base and siphon regions. Anterior and middle cerebral vessels are patent. No large or medium vessel occlusion. No correctable proximal stenosis. Posterior circulation: Both vertebral arteries patent to the basilar. The right is dominant. The left is a tiny vessel. Posterior circulation branch vessels are normal. Patent posterior communicating arteries on both sides. Venous sinuses: Patent and normal. Anatomic variants: None significant otherwise. Review of the MIP images confirms the above findings IMPRESSION: 1. Mild atherosclerotic change at both carotid bifurcations but no stenosis. 2. No intracranial large or medium vessel occlusion or correctable proximal stenosis. 3. Emphysema and aortic atherosclerosis. Aortic Atherosclerosis (ICD10-I70.0) and Emphysema (ICD10-J43.9). Electronically Signed   By: Nelson Chimes M.D.   On: 05/06/2020 12:55   MR BRAIN WO CONTRAST  Result Date: 05/06/2020 CLINICAL DATA:  Left lower extremity weakness, redness and swelling. EXAM: MRI HEAD WITHOUT CONTRAST TECHNIQUE: Multiplanar, multiecho pulse sequences of the brain and surrounding structures were obtained without intravenous contrast. COMPARISON:  06/22/2016 FINDINGS: Brain: Diffusion imaging does not show any acute or subacute infarction. No focal abnormality affects the brainstem or cerebellum. Cerebral hemispheres show an old right frontal subcortical white matter infarction and an old small infarction at the left frontal operculum. No evidence of mass lesion, hemorrhage, hydrocephalus or extra-axial collection. Vascular: Major vessels at the base of the brain show flow. Skull and upper cervical spine: Negative Sinuses/Orbits: Left maxillary, ethmoid and frontal sinus opacification and inflammatory change. Orbits negative. Other: None IMPRESSION: 1. No acute intracranial finding. 2. Old small vessel infarctions at the right frontal  subcortical white matter and left frontal operculum. 3. Left maxillary, ethmoid and frontal sinusitis. Electronically Signed   By: Nelson Chimes M.D.   On: 05/06/2020 10:49   CT TIBIA FIBULA LEFT W CONTRAST  Result Date: 05/20/2020 CLINICAL DATA:  Diffuse soft tissue swelling involving the left lower extremity. EXAM: CT OF THE LOWER RIGHT EXTREMITY WITH CONTRAST TECHNIQUE: Multidetector CT imaging of the lower right extremity was performed according to the standard protocol following intravenous contrast administration. COMPARISON:  Knee radiographs 05/12/2013 CONTRAST:  188mL OMNIPAQUE IOHEXOL 300 MG/ML  SOLN FINDINGS: Diffuse and marked subcutaneous soft tissue swelling/edema/fluid and skin thickening all consistent with severe diffuse cellulitis. I do not see a discrete rim enhancing fluid collection to suggest a drainable soft tissue abscess. Small knee joint effusion noted along with a small Baker's cyst. There are severe chronic posttraumatic degenerative changes involving the left knee. No definite CT findings for myofasciitis or pyomyositis. The ankle joint is grossly maintained. No findings suspicious for septic arthritis or osteomyelitis. IMPRESSION: 1. Severe diffuse cellulitis involving the left lower extremity. No discrete rim enhancing fluid collection to suggest a drainable soft tissue abscess. 2. No CT findings suspicious for myofasciitis  or pyomyositis. 3. No CT findings suspicious for septic arthritis or osteomyelitis. 4. Severe chronic posttraumatic degenerative changes involving the left knee. Electronically Signed   By: Marijo Sanes M.D.   On: 05/20/2020 10:02   VAS [place] LOWER EXTREMITY VENOUS (DVT) (ONLY [facility] & [facility])  Result Date: 05/06/2020  Lower Venous DVTStudy Indications: Edema, and Swelling.  Limitations: Body habitus and poor ultrasound/tissue interface. Comparison Study: 05/06/15 previous Performing Technologist: Abram Sander RVS  Examination Guidelines: A complete evaluation includes  B-mode imaging, spectral Doppler, color Doppler, and power Doppler as needed of all accessible portions of each vessel. Bilateral testing is considered an integral part of a complete examination. Limited examinations for reoccurring indications may be performed as noted. The reflux portion of the exam is performed with the patient in reverse Trendelenburg.  +-----+---------------+---------+-----------+----------+--------------+ RIGHTCompressibilityPhasicitySpontaneityPropertiesThrombus Aging +-----+---------------+---------+-----------+----------+--------------+ CFV  Full           Yes      Yes                                 +-----+---------------+---------+-----------+----------+--------------+   +---------+---------------+---------+-----------+----------+--------------+ LEFT     CompressibilityPhasicitySpontaneityPropertiesThrombus Aging +---------+---------------+---------+-----------+----------+--------------+ CFV      Full           Yes      Yes                                 +---------+---------------+---------+-----------+----------+--------------+ SFJ      Full                                                        +---------+---------------+---------+-----------+----------+--------------+ FV Prox  Full                                                        +---------+---------------+---------+-----------+----------+--------------+ FV Mid   Full                                                        +---------+---------------+---------+-----------+----------+--------------+ FV DistalFull                                                        +---------+---------------+---------+-----------+----------+--------------+ PFV      Full                                                        +---------+---------------+---------+-----------+----------+--------------+ POP      Full           Yes      Yes                                  +---------+---------------+---------+-----------+----------+--------------+  PTV                     Yes      Yes                                 +---------+---------------+---------+-----------+----------+--------------+ PERO                                                  Not visualized +---------+---------------+---------+-----------+----------+--------------+     Summary: RIGHT: - No evidence of common femoral vein obstruction.  LEFT: - There is no evidence of deep vein thrombosis in the lower extremity. However, portions of this examination were limited- see technologist comments above.  - No cystic structure found in the popliteal fossa.  *See table(s) above for measurements and observations. Electronically signed by Servando Snare MD on 05/06/2020 at 9:21:55 AM.    Final     Microbiology: Recent Results (from the past 240 hour(s))  Culture, blood (routine x 2)     Status: None (Preliminary result)   Collection Time: 05/20/20  4:34 PM   Specimen: BLOOD RIGHT HAND  Result Value Ref Range Status   Specimen Description BLOOD RIGHT HAND  Final   Special Requests   Final    BOTTLES DRAWN AEROBIC ONLY Blood Culture results may not be optimal due to an inadequate volume of blood received in culture bottles   Culture   Final    NO GROWTH 3 DAYS Performed at [facility], [place] [address] [place], [place] [address]    Report Status PENDING  Incomplete  Culture, blood (routine x 2)     Status: None (Preliminary result)   Collection Time: 05/20/20  4:39 PM   Specimen: BLOOD RIGHT HAND  Result Value Ref Range Status   Specimen Description BLOOD RIGHT HAND  Final   Special Requests   Final    BOTTLES DRAWN AEROBIC ONLY Blood Culture results may not be optimal due to an inadequate volume of blood received in culture bottles   Culture   Final    NO GROWTH 3 DAYS Performed at [facility], [place] [address], [place], [place] [address]    Report Status PENDING  Incomplete   Respiratory Panel by RT PCR (Flu A&B, Covid) - Nasopharyngeal Swab     Status: None   Collection Time: 05/20/20  6:06 PM   Specimen: Nasopharyngeal Swab  Result Value Ref Range Status   SARS Coronavirus 2 by RT PCR NEGATIVE NEGATIVE Final    Comment: (NOTE) SARS-CoV-2 target nucleic acids are NOT DETECTED.  The SARS-CoV-2 RNA is generally detectable in upper respiratoy specimens during the acute phase of infection. The lowest concentration of SARS-CoV-2 viral copies this assay can detect is 131 copies/mL. A negative result does not preclude SARS-Cov-2 infection and should not be used as the sole basis for treatment or other patient management decisions. A negative result may occur with  improper specimen collection/handling, submission of specimen other than nasopharyngeal swab, presence of viral mutation(s) within the areas targeted by this assay, and inadequate number of viral copies (<131 copies/mL). A negative result must be combined with clinical observations, patient history, and epidemiological information. The expected result is Negative.  Fact Sheet for Patients:  PinkCheek.be  Fact Sheet for Healthcare  Providers:  GravelBags.it  This test is no t yet approved or cleared by the [place] and  has been authorized for detection and/or diagnosis of SARS-CoV-2 by FDA under an Emergency Use Authorization (EUA). This EUA will remain  in effect (meaning this test can be used) for the duration of the COVID-19 declaration under Section 564(b)(1) of the Act, 21 U.S.C. section 360bbb-3(b)(1), unless the authorization is terminated or revoked sooner.     Influenza A by PCR NEGATIVE NEGATIVE Final   Influenza B by PCR NEGATIVE NEGATIVE Final    Comment: (NOTE) The Xpert Xpress SARS-CoV-2/FLU/RSV assay is intended as an aid in  the diagnosis of influenza from Nasopharyngeal swab specimens and  should not be used as  a sole basis for treatment. Nasal washings and  aspirates are unacceptable for Xpert Xpress SARS-CoV-2/FLU/RSV  testing.  Fact Sheet for Patients: PinkCheek.be  Fact Sheet for Healthcare Providers: GravelBags.it  This test is not yet approved or cleared by the [place] FDA and  has been authorized for detection and/or diagnosis of SARS-CoV-2 by  FDA under an Emergency Use Authorization (EUA). This EUA will remain  in effect (meaning this test can be used) for the duration of the  Covid-19 declaration under Section 564(b)(1) of the Act, 21  U.S.C. section 360bbb-3(b)(1), unless the authorization is  terminated or revoked. Performed at [facility], [place] [address], [place],  [address]      Labs: Basic Metabolic Panel: Recent Labs  Lab 05/20/20 1548 05/21/20 0503 05/22/20 0057 05/23/20 0719  NA  --  132* 134* 131*  K  --  4.3 4.4 3.9  CL  --  100 101 99  CO2  --  19* 25 23  GLUCOSE  --  88 95 98  BUN  --  15 16 14   CREATININE 1.23 1.30* 1.35* 1.26*  CALCIUM  --  8.3* 8.3* 8.6*  MG  --  1.8  --   --   PHOS  --  3.6  --   --    Liver Function Tests: Recent Labs  Lab 05/21/20 0503  AST 40  ALT 43  ALKPHOS 108  BILITOT 1.3*  PROT 6.8  ALBUMIN 2.3*   No results for input(s): LIPASE, AMYLASE in the last 168 hours. No results for input(s): AMMONIA in the last 168 hours. CBC: Recent Labs  Lab 05/20/20 1548 05/21/20 0707 05/22/20 0057  WBC 9.4 8.9 10.1  HGB 9.7* 9.6* 9.8*  HCT 29.4* 30.0* 30.3*  MCV 91.9 92.3 94.1  PLT 404* 364 397   Cardiac Enzymes: Recent Labs  Lab 05/21/20 0503  CKTOTAL 72   BNP: BNP (last 3 results) Recent Labs    05/05/20 2304  BNP 157.0*    ProBNP (last 3 results) No results for input(s): PROBNP in the last 8760 hours.  CBG: No results for input(s): GLUCAP in the last 168 hours.     Signed:  Domenic Polite MD.  [facility] 05/23/2020,  11:29 AM

## 2020-05-23 NOTE — Progress Notes (Signed)
David [name] to be discharged Home  per MD order. Discussed prescriptions and follow up appointments with the patient. Prescriptions given to patient; medication list explained in detail. Patient verbalized understanding.  Skin clean, dry and intact without evidence of skin break down, no evidence of skin tears noted. IV catheter discontinued intact. Site without signs and symptoms of complications. Dressing and pressure applied. Pt denies pain at the site currently. No complaints noted.  Patient free of lines, drains, and wounds.   An After Visit Summary (AVS) was printed and given to the patient. Patient escorted via wheelchair, and discharged home via private auto.  Shela Commons, RN

## 2020-05-23 NOTE — Progress Notes (Signed)
Pharmacy Antibiotic Note  David [name] is a 64 y.o. male admitted on 05/20/2020 with cellulitis.  Pharmacy has been consulted for vanc/cefepime dosing.  Morbid obesity man who was recently treated with dalbavancin for cellulitis. F/u CT still shows severe cellulitis. Vanc/cefepime have been ordered per ID. His wt is 205 kg on 9/16  Scr  1.13 on 9/16 pending today>>1.23>>1.26  Vancomycin trough this AM 14 which is within goal range for this indication.   Plan: Continue Vanc  1.5g IV q12 Cefepime 2g IV x1 then q8h Levels as needed Height: 6\' 3"  (190.5 cm) Weight: (!) 205 kg (452 lb) IBW/kg (Calculated) : 84.5  Temp (24hrs), Avg:98.9 F (37.2 C), Min:98.4 F (36.9 C), Max:99.7 F (37.6 C)  Recent Labs  Lab 05/20/20 1548 05/21/20 0503 05/21/20 0707 05/22/20 0057 05/23/20 0719  WBC 9.4  --  8.9 10.1  --   CREATININE 1.23 1.30*  --  1.35* 1.26*  VANCOTROUGH  --   --   --   --  14*    Estimated Creatinine Clearance: 112.6 mL/min (A) (by C-G formula based on SCr of 1.26 mg/dL (H)).    Allergies  Allergen Reactions  . Codeine Shortness Of Breath and Swelling  . Morphine And Related Shortness Of Breath and Swelling    Antimicrobials this admission: 9/17 dalbavancin 1.5g IV x1 10/1 vanc>> 10/1 cefepime>>  Dose adjustments this admission: VT 14, no change at this time.    Microbiology results: 9/21 wound>>neg  Keysha Damewood A. Levada Dy, PharmD, BCPS, FNKF Clinical Pharmacist [place] Please utilize Amion for appropriate phone number to reach the unit pharmacist ([place])   05/23/2020 9:20 AM

## 2020-05-23 NOTE — Evaluation (Signed)
Physical Therapy Evaluation Patient Details Name: David [name] MRN: [id number] DOB: 30-Aug-1955 Today's Date: 05/23/2020   History of Present Illness  64/M with history of persistent atrial fibrillation on anticoagulation, history of embolic stroke, obstructive sleep apnea, hypertension, moderate aortic stenosis, RBBB, morbid obesity, chronic venous insufficiency with chronic left lower extremity edema, recently treated with doxycycline by his PCP for left leg cellulitis was seen in the emergency room on 9/17 and given IV dalbavancin, following this he follow-up with infectious disease yesterday 10/1, he was noted to have severe cellulitis of his left lower extremity and admitted to the hospital for further management  Clinical Impression   Pt admitted with above diagnosis. Comes from home where he lives alone in a 2 story house with 6 steps to enter; Full bathroom and bedroom are on second floor; Presents to PT with incr pain L foot and ankle with amb, fatigue, and decr activity tolerance; Today's session did help give him more insight into his current deficits, and he is more open to staying on his first floor; We talked about mobility and ADL options like sponge bathing, and positioning for LLE elevation and sleeping on couch downstairs; Pt currently with functional limitations due to the deficits listed below (see PT Problem List). Pt will benefit from skilled PT to increase their independence and safety with mobility to allow discharge to the venue listed below.       Follow Up Recommendations Home health PT;Supervision - Intermittent;Other (comment) (Rec HHOT as well)    Equipment Recommendations  Rolling walker with 5" wheels;3in1 (PT) (Bariatric)    Recommendations for Other Services Other (comment) (Pt declined OT consult)     Precautions / Restrictions Precautions Precautions: Fall Precaution Comments: Fall risk minimized with use of RW; Very painful LLE, esp with weight  bearing Restrictions Weight Bearing Restrictions: No      Mobility  Bed Mobility Overal bed mobility: Modified Independent             General bed mobility comments: Incr time and tendign to hold breath with effort  Transfers Overall transfer level: Needs assistance Equipment used: Rolling walker (2 wheeled) Transfers: Sit to/from Stand Sit to Stand: Min guard (without physical contact)         General transfer comment: tended to pull up on RW, and dependent on momentum to stand  Ambulation/Gait Ambulation/Gait assistance: Min guard Gait Distance (Feet): 100 Feet Assistive device: Rolling walker (2 wheeled) Gait Pattern/deviations: Step-to pattern Gait velocity: Very slow   General Gait Details: Cues for gait sequence and to bear down on RW to [place] painfull L foot and ankle in stance; Fatigued halfway through walk, with 4 short standing rest breaks, and tendency to have trunk flexed on teh way back to the room  Stairs            Wheelchair Mobility    Modified Rankin (Stroke Patients Only)       Balance Overall balance assessment: Needs assistance   Sitting balance-Leahy Scale: Good       Standing balance-Leahy Scale: Poor Standing balance comment: Reliant on UE support                             Pertinent Vitals/Pain Pain Assessment: 0-10 Pain Score: 9  Pain Location: LLE with weight bearing Pain Descriptors / Indicators: Grimacing;Guarding;Crushing;Tightness Pain Intervention(s): Monitored during session;Premedicated before session;Repositioned    Home Living Family/patient expects to be discharged to:: Private  residence Living Arrangements: Alone Available Help at Discharge: Friend(s);Available PRN/intermittently Type of Home: House Home Access: Stairs to enter Entrance Stairs-Rails: Right Entrance Stairs-Number of Steps: 6 Home Layout: Two level;Bed/bath upstairs Home Equipment: Cane - single point;Grab bars -  tub/shower      Prior Function Level of Independence: Independent               Hand Dominance   Dominant Hand: Right    Extremity/Trunk Assessment   Upper Extremity Assessment Upper Extremity Assessment: Generalized weakness    Lower Extremity Assessment Lower Extremity Assessment: Generalized weakness;LLE deficits/detail LLE Deficits / Details: Able to perform minimal straight leg raise against gravity; active knee flex/extend present; +toe wiggle and able to move ankle, though limited range; Pain concentrated in L midfoot with weight bearing       Communication   Communication: No difficulties  Cognition Arousal/Alertness: Awake/alert Behavior During Therapy: WFL for tasks assessed/performed Overall Cognitive Status: Within Functional Limits for tasks assessed                                        General Comments General comments (skin integrity, edema, etc.): Took extra time to problem-solve through managing independently at home; He showed good insight, identifying that he will likely need to stay on his first floor initially at home    Exercises     Assessment/Plan    PT Assessment Patient needs continued PT services  PT Problem List Decreased strength;Decreased range of motion;Decreased activity tolerance;Decreased balance;Decreased mobility;Decreased knowledge of use of DME;Pain;Obesity       PT Treatment Interventions DME instruction;Gait training;Stair training;Functional mobility training;Therapeutic activities;Therapeutic exercise;Balance training;Patient/family education;Wheelchair mobility training    PT Goals (Current goals can be found in the Care Plan section)  Acute Rehab PT Goals Patient Stated Goal: Plans to go home today; wants to be done with this infection PT Goal Formulation: With patient Time For Goal Achievement: 05/30/20 Potential to Achieve Goals: Good    Frequency Min 3X/week   Barriers to discharge Decreased  caregiver support;Inaccessible home environment Lives alone and has a flight of steps to access bedroom and full bath; plans to sponge bathe adn stay downstairs initially    Co-evaluation               AM-PAC PT "6 Clicks" Mobility  Outcome Measure Help needed turning from your back to your side while in a flat bed without using bedrails?: None Help needed moving from lying on your back to sitting on the side of a flat bed without using bedrails?: None Help needed moving to and from a bed to a chair (including a wheelchair)?: A Little Help needed standing up from a chair using your arms (e.g., wheelchair or bedside chair)?: A Little Help needed to walk in hospital room?: A Little Help needed climbing 3-5 steps with a railing? : A Lot 6 Click Score: 19    End of Session   Activity Tolerance: Patient tolerated treatment well;Other (comment) (Even though he is quite painful) Patient left: in chair;with call [place]/phone within reach Nurse Communication: Mobility status;Other (comment) (rEcs for dc) PT Visit Diagnosis: Unsteadiness on feet (R26.81);Other abnormalities of gait and mobility (R26.89);Pain Pain - Right/Left: Left Pain - part of body: Leg    Time: 2248-2500 PT Time Calculation (min) (ACUTE ONLY): 60 min   Charges:   PT Evaluation $PT Eval Moderate Complexity: 1 Mod PT  Treatments $Gait Training: 23-37 mins $Therapeutic Activity: 8-22 mins        Roney Marion, [place]  Acute Rehabilitation Services Pager [phone] Office [place] 05/23/2020, 10:29 AM

## 2020-05-23 NOTE — TOC Initial Note (Signed)
Transition of Care [facility]) - Initial/Assessment Note    Patient Details  Name: David [name] MRN: [id number] Date of Birth: 12/11/1955  [facility] of [facility]) CM/SW Contact:    Curlene Labrum, RN Phone Number: 05/23/2020, 10:53 AM  Clinical Narrative:                 Case management spoke with the patient on the phone regarding discharge planning to home today.  The patient states that he lives alone and has friends to assist as needed.  The patient was given choice regarding dme and patient did not have a preference.  I called [employer] and the bariatric Rw and 3:1 are ordered and will be delivered to the patient's room.  I also offered the patient choice regarding home health services for Pt/OT and the patient declined both home health and outpatient PT/Ot at this point.  The patient will be discharging today with a friend around 1300 today after his dme arrives to the hospital room.  Expected Discharge Plan: Home/Self Care Barriers to Discharge: No Barriers Identified   Patient Goals and CMS Choice Patient states their goals for this hospitalization and ongoing recovery are:: Patient plans to discharge to home with self-care.  The patient is refusing home health PT/OT and outpatient therapy at this point. CMS Medicare.gov Compare Post Acute Care list provided to:: Patient Choice offered to / list presented to : Patient  Expected Discharge Plan and Services Expected Discharge Plan: Home/Self Care   Discharge Planning Services: CM Consult Post Acute Care Choice: Durable Medical Equipment Living arrangements for the past 2 months: Single Family Home Expected Discharge Date: 05/23/20               DME Arranged: 3-N-1, Walker rolling (both RW and 3:1 are bariatric) DME Agency: [employer] Date DME Agency Contacted: 05/23/20 Time DME Agency Contacted: 0051 Representative spoke with at DME Agency: Sue Lush, rep with [employer]            Prior Living Arrangements/Services Living  arrangements for the past 2 months: Single Family Home Lives with:: Self Patient language and need for interpreter reviewed:: Yes        Need for Family Participation in Patient Care: Yes (Comment) Care giver support system in place?: Yes (comment)   Criminal Activity/Legal Involvement Pertinent to Current Situation/Hospitalization: No - Comment as needed  Activities of Daily Living Home Assistive Devices/Equipment: Cane (specify quad or straight) (straight) ADL Screening (condition at time of admission) Patient's cognitive ability adequate to safely complete daily activities?: Yes Is the patient deaf or have difficulty hearing?: No Does the patient have difficulty seeing, even when wearing glasses/contacts?: No Does the patient have difficulty concentrating, remembering, or making decisions?: No Patient able to express need for assistance with ADLs?: No Does the patient have difficulty dressing or bathing?: No Independently performs ADLs?: Yes (appropriate for developmental age) Does the patient have difficulty walking or climbing stairs?: Yes Weakness of Legs: Both Weakness of Arms/Hands: None  Permission Sought/Granted Permission sought to share information with : Case Manager Permission granted to share information with : Yes, Verbal Permission Granted     Permission granted to share info w AGENCY: [employer], dme company        Emotional Assessment Appearance::  (Patient assessment completed over the phone.) Attitude/Demeanor/Rapport: Self-Confident Affect (typically observed): Accepting Orientation: : Oriented to Self, Oriented to Place, Oriented to  Time, Oriented to Situation Alcohol / Substance Use: Not Applicable Psych Involvement: No (comment)  Admission diagnosis:  Cellulitis and abscess of left leg [L03.116, L02.416] Patient Active Problem List   Diagnosis Date Noted  . Cellulitis and abscess of left leg 05/20/2020  . Cellulitis 05/06/2020  . Anxiety 02/24/2019  .  Family history of colon cancer in father 02/24/2019  . Venous insufficiency 08/21/2017  . Seborrheic dermatitis 05/14/2017  . Nasal valve collapse 04/17/2017  . Sensorineural hearing loss (SNHL), bilateral 04/17/2017  . Tinnitus of right ear 03/21/2017  . Cerebrovascular accident (CVA) due to embolism of left middle cerebral artery ([place]) 06/22/2016  . Aortic stenosis 10/05/2015  . Lower extremity edema   . Allergic rhinitis 05/08/2015  . Former smoker 05/08/2015  . Morbid obesity due to excess calories ([place]) 01/12/2015  . Symptomatic varicose veins of left lower extremity 07/06/2014  . PAF (paroxysmal atrial fibrillation) ([place]) 03/18/2013  . OSA treated with BiPAP 03/18/2013  . RBBB 03/18/2013  . HTN (hypertension) 03/18/2013  . Aortic aneurysm ([place]) 03/18/2013   PCP:  Denita Lung, MD Pharmacy:   [facility] [address] [place] [address] Phone: [phone] Fax: [phone]     Social Determinants of Health (SDOH) Interventions    Readmission Risk Interventions Readmission Risk Prevention Plan 05/23/2020  Post Dischage Appt Complete  Medication Screening Complete  Transportation Screening Complete  Some recent data might be hidden

## 2020-05-24 ENCOUNTER — Telehealth: Payer: Self-pay

## 2020-05-24 NOTE — Telephone Encounter (Signed)
Pt. Aware he thought he was supposed to be sent home with Keflex and doxycycline. I told him you said it was just Keflex.

## 2020-05-24 NOTE — Telephone Encounter (Signed)
I called the pt. Because he was recently in the hospital for cellulitis and hypertension. I went over the pts. Medication and it was reconciled. He also let me know that they tried to call in Doxycycline for him when he was in the hospital and he said it was not covered by his insurance, he wanted to know if he could call in something else for him in the mean time or get it covered by his insurance.

## 2020-05-24 NOTE — Telephone Encounter (Signed)
If you look at his discharge summary he was sent home on Keflex.  Make sure that he got it

## 2020-05-25 LAB — CULTURE, BLOOD (ROUTINE X 2)
Culture: NO GROWTH
Culture: NO GROWTH

## 2020-06-01 ENCOUNTER — Ambulatory Visit: Payer: 59 | Admitting: Family Medicine

## 2020-06-01 ENCOUNTER — Encounter: Payer: Self-pay | Admitting: Family Medicine

## 2020-06-01 ENCOUNTER — Other Ambulatory Visit: Payer: Self-pay

## 2020-06-01 VITALS — BP 122/74 | HR 86 | Temp 97.8°F | Wt >= 6400 oz

## 2020-06-01 DIAGNOSIS — L03116 Cellulitis of left lower limb: Secondary | ICD-10-CM

## 2020-06-01 DIAGNOSIS — I890 Lymphedema, not elsewhere classified: Secondary | ICD-10-CM

## 2020-06-01 MED ORDER — CEPHALEXIN 500 MG PO CAPS: 500.0000 mg | ORAL_CAPSULE | Freq: Three times a day (TID) | ORAL | 0 refills | Status: DC

## 2020-06-01 NOTE — Patient Instructions (Signed)
Continue on the Keflex.  Elevate your feet.  Try heat for 20 minutes 3 times per day Go ahead and use probiotics

## 2020-06-01 NOTE — Progress Notes (Signed)
Established patient visit   Patient: David [name]   DOB: [birth year]   64 y.o. Male  MRN: [id number] Visit Date: 06/01/2020  Today's healthcare provider: Jill Alexanders, MD   Chief Complaint  Patient presents with   other    hospital follow    Subjective    HPI  Follow up Hospitalization  Patient was admitted to [facility] on 10/1 and discharged on October for. He was treated for cellulitis. Treatment for this included IV antibiotics switching to Keflex.  He reports good compliance with treatment. He reports this condition is improved. He was given antibiotics for 5 days and is concerned about stopping it at this point.  He was given Keflex 250 3 times daily. ----------------------------------------------------------------------------------------- -         Medications: Outpatient Medications Prior to Visit  Medication Sig   ALPRAZolam (XANAX) 1 MG tablet TAKE 1 TABLET(1 MG) BY MOUTH AT BEDTIME AS NEEDED FOR SLEEP (Patient taking differently: Take 1 mg by mouth at bedtime as needed for anxiety or sleep. )   atorvastatin (LIPITOR) 10 MG tablet TAKE 1 TABLET BY MOUTH EVERY DAY AT 6 PM (Patient taking differently: Take 10 mg by mouth daily. )   diltiazem (CARDIZEM CD) 120 MG 24 hr capsule Take 1 capsule (120 mg total) by mouth daily.   ferrous sulfate 325 (65 FE) MG EC tablet Take 1 tablet (325 mg total) by mouth 2 (two) times daily with a meal.   hydrocortisone 2.5 % cream Apply to external ears as needed for itching. (Patient taking differently: Apply 1 application topically daily as needed (For itching). )   methocarbamol (ROBAXIN) 500 MG tablet TAKE 1 TABLET(500 MG) BY MOUTH FOUR TIMES DAILY (Patient taking differently: Take 500 mg by mouth 4 (four) times daily as needed for muscle spasms. )   metoprolol succinate (TOPROL-XL) 50 MG 24 hr tablet Take with or immediately following a meal. (Patient taking differently: Take 50 mg by mouth daily. Take with or  immediately following a meal.)   oxyCODONE-acetaminophen (PERCOCET) 5-325 MG tablet Take 1 tablet by mouth every 4 (four) hours as needed for severe pain.   rivaroxaban (XARELTO) 20 MG TABS tablet TAKE 1 TABLET(20 MG) BY MOUTH DAILY WITH SUPPER (Patient taking differently: Take 20 mg by mouth daily with supper. )   valsartan-hydrochlorothiazide (DIOVAN-HCT) 160-25 MG tablet Take 0.5 tablets by mouth daily. Take half tab daily for the next 3 days and if blood pressure sustains above 120 can start taking 1 tablet daily after that   No facility-administered medications prior to visit.   Exam of the left extremity does show diffuse edema halfway up his thigh that is firm.  Slightly erythematous but not warm. Cellulitis of left lower extremity - Plan: cephALEXin (KEFLEX) 500 MG capsule  Morbid obesity due to excess calories (HCC)  Lymphedema I will give him Keflex for the next month.  He will call me in a couple weeks to let me know how he is doing otherwise I will see him in 1 month.  He is to keep his leg elevated is much as possible and use warm soaks as tolerated.  I explained that I need to use it as long because with the lymphedema and cellulitis, healing can be quite slow.  Also discussed the possibility of C. difficile.  He used to use probiotics.  He was comfortable with this approach.  Approximately 30 minutes spent reviewing the record, exam and discussion.  Jill Alexanders, MD   [place]

## 2020-06-08 ENCOUNTER — Other Ambulatory Visit: Payer: Self-pay | Admitting: Family Medicine

## 2020-06-09 ENCOUNTER — Telehealth: Payer: Self-pay

## 2020-06-09 MED ORDER — HYDROCODONE-ACETAMINOPHEN 5-325 MG PO TABS
1.0000 | ORAL_TABLET | Freq: Four times a day (QID) | ORAL | 0 refills | Status: DC | PRN
Start: 2020-06-09 — End: 2020-07-07

## 2020-06-09 NOTE — Telephone Encounter (Signed)
I discussed pain management with him.  Recommend he try 3 ibuprofen up to 4 times per day first and then use the codeine after that.  He expressed understanding of this.

## 2020-06-09 NOTE — Telephone Encounter (Signed)
Pt left message that he needs pain med refill

## 2020-06-09 NOTE — Telephone Encounter (Signed)
[facility] is request ing to fill pt oxycodone. Please advise [facility]

## 2020-06-23 ENCOUNTER — Ambulatory Visit: Payer: 59 | Admitting: Internal Medicine

## 2020-07-01 ENCOUNTER — Encounter: Payer: Self-pay | Admitting: Family Medicine

## 2020-07-07 ENCOUNTER — Other Ambulatory Visit: Payer: Self-pay

## 2020-07-07 ENCOUNTER — Encounter: Payer: Self-pay | Admitting: Family Medicine

## 2020-07-07 ENCOUNTER — Ambulatory Visit: Payer: 59 | Admitting: Family Medicine

## 2020-07-07 VITALS — BP 122/78 | HR 79 | Temp 98.2°F | Wt >= 6400 oz

## 2020-07-07 DIAGNOSIS — I480 Paroxysmal atrial fibrillation: Secondary | ICD-10-CM | POA: Diagnosis not present

## 2020-07-07 DIAGNOSIS — I890 Lymphedema, not elsewhere classified: Secondary | ICD-10-CM | POA: Diagnosis not present

## 2020-07-07 DIAGNOSIS — F419 Anxiety disorder, unspecified: Secondary | ICD-10-CM | POA: Diagnosis not present

## 2020-07-07 DIAGNOSIS — L03116 Cellulitis of left lower limb: Secondary | ICD-10-CM

## 2020-07-07 MED ORDER — TRAMADOL HCL 50 MG PO TABS: 50.0000 mg | ORAL_TABLET | Freq: Three times a day (TID) | ORAL | 0 refills | Status: AC | PRN

## 2020-07-07 MED ORDER — ALPRAZOLAM 1 MG PO TABS: ORAL_TABLET | ORAL | 0 refills | Status: DC

## 2020-07-07 MED ORDER — CEPHALEXIN 500 MG PO CAPS: 500.0000 mg | ORAL_CAPSULE | Freq: Three times a day (TID) | ORAL | 0 refills | Status: DC

## 2020-07-07 NOTE — Patient Instructions (Addendum)
Take 2 Tylenol 4 times per day and then if you need something after use the tramadol.

## 2020-07-07 NOTE — Progress Notes (Signed)
   Subjective:    Patient ID: David [name], male    DOB: [birth year], 64 y.o.   MRN: [id number]  HPI He is here for recheck.  He does state that his leg is doing much better but still does have some pain especially at night.  He has been using Xanax periodically to help this.  He is also taking intermittent doses of Tylenol.  He is on Xarelto and therefore has to avoid NSAIDs.  He also has concerns over Lipitor causing muscle aches and pains.  He has been on this for several years however never mentioned any issues with muscle aches and pains until now.   Review of Systems     Objective:   Physical Exam Alert and in no distress.  Exam of the left lower extremity does show decrease in edema and slight erythema and minimal tenderness to discomfort.       Assessment & Plan:  Cellulitis of left lower extremity - Plan: cephALEXin (KEFLEX) 500 MG capsule  Anxiety - Plan: ALPRAZolam (XANAX) 1 MG tablet  Lymphedema - Plan: traMADol (ULTRAM) 50 MG tablet  PAF (paroxysmal atrial fibrillation) (HCC)     Although the cellulitis looks better, I would prefer to keep him on an antibiotic for another month.  He was comfortable with that.      Recommend he take 2 Tylenol 4 times per day to help with the aches and pains and only use the tramadol on an as-needed basis for the pain.  I explained because of his use of Xarelto for his PAF, we cannot use NSAIDs.      Discussed the use of Xanax.  He uses this very sparingly so I have no problems with renewing it.  Did not recommend that he take this at night to help with sleeping.  Hopefully better pain control can help.       I then discussed the muscle aches and pains that he is having.  Recommend that he stop taking the Lipitor for at least a week to see if the pain goes away and if it does, start again to see if it reoccurs.  If that is the case then I will switch him to a different statin. Greater than 30 minutes spent in counseling and coordination of  care.

## 2020-07-28 ENCOUNTER — Ambulatory Visit (INDEPENDENT_AMBULATORY_CARE_PROVIDER_SITE_OTHER): Payer: 59 | Admitting: Internal Medicine

## 2020-07-28 ENCOUNTER — Encounter: Payer: Self-pay | Admitting: Internal Medicine

## 2020-07-28 ENCOUNTER — Other Ambulatory Visit: Payer: Self-pay

## 2020-07-28 VITALS — BP 138/72 | HR 82 | Ht 75.0 in | Wt >= 6400 oz

## 2020-07-28 DIAGNOSIS — I872 Venous insufficiency (chronic) (peripheral): Secondary | ICD-10-CM | POA: Diagnosis not present

## 2020-07-28 DIAGNOSIS — R600 Localized edema: Secondary | ICD-10-CM

## 2020-07-28 DIAGNOSIS — I350 Nonrheumatic aortic (valve) stenosis: Secondary | ICD-10-CM | POA: Diagnosis not present

## 2020-07-28 DIAGNOSIS — I4819 Other persistent atrial fibrillation: Secondary | ICD-10-CM | POA: Diagnosis not present

## 2020-07-28 NOTE — Patient Instructions (Addendum)
Medication Instructions:  Your physician recommends that you continue on your current medications as directed. Please refer to the Current Medication list given to you today.  *If you need a refill on your cardiac medications before your next appointment, please call your pharmacy*  Testing/Procedures: Your physician has requested that you have an echocardiogram. Echocardiography is a painless test that uses sound waves to create images of your heart. It provides your doctor with information about the size and shape of your heart and how well your heart's chambers and valves are working. This procedure takes approximately one hour. There are no restrictions for this procedure. -- [address] - 3rd Floor  Lower Extremity Venous Doppler @ [facility] [address] [place], [place] [address]   Follow-Up: At [facility], you and your health needs are our priority.  As part of our continuing mission to provide you with exceptional heart care, we have created designated Provider Care Teams.  These Care Teams include your primary Cardiologist (physician) and Advanced Practice Providers (APPs -  Physician Assistants and Nurse Practitioners) who all work together to provide you with the care you need, when you need it.  We recommend signing up for the patient portal called "MyChart".  Sign up information is provided on this After Visit Summary.  MyChart is used to connect with patients for Virtual Visits (Telemedicine).  Patients are able to view lab/test results, encounter notes, upcoming appointments, etc.  Non-urgent messages can be sent to your provider as well.   To learn more about what you can do with MyChart, go to [email or web address].    Your next appointment:   2-3 months  The format for your next appointment:   In Person  Provider:   You may see [name], MD or one of the following Advanced Practice Providers on your designated Care Team:    Almyra Deforest,  PA-C  Fabian Sharp, PA-C or   Roby Lofts, [place]    Other Instructions Dr. Debara Pickett recommends THIGH HIGH COMPRESSION STOCKINGS  -- 20-30 mmHg (compression strength) -- [facility]  -- [address]  -- [place]. [place]   -- [phone]  Thursday 9AM-3PM  Friday  9AM-3PM  Saturday Closed  Sunday Closed  Monday 9AM-3PM  Tuesday 9AM-3PM  Wednesday 9AM-3PM -- [address] [address] [place]  -- [phone]   How to Use Compression Stockings Compression stockings are elastic socks that squeeze the legs. They help to increase blood flow to the legs, decrease swelling in the legs, and reduce the chance of developing blood clots in the lower legs. Compression stockings are often used by people who:  Are recovering from surgery.  Have poor circulation in their legs.  Are prone to getting blood clots in their legs.  Have varicose veins.  Sit or stay in bed for long periods of time. How to use compression stockings Before you put on your compression stockings:  Make sure that they are the correct size. If you do not know your size, ask your health care provider.  Make sure that they are clean, dry, and in good condition.  Check them for rips and tears. Do not put them on if they are ripped or torn. Put your stockings on first thing in the morning, before you get out of bed. Keep them on for as long as your health care provider advises. When  you are wearing your stockings:  Keep them as smooth as possible. Do not allow them to bunch up. It is especially important to prevent the stockings from bunching up around your toes or behind your knees.  Do not roll the stockings downward and leave them rolled down. This can decrease blood flow to your leg.  Change them right away if they become wet or dirty. When you take off your stockings, inspect your legs  and feet. Anything that does not seem normal may require medical attention. Look for:  Open sores.  Red spots.  Swelling. Information and tips  Do not stop wearing your compression stockings without talking to your health care provider first.  Wash your stockings every day with mild detergent in cold or warm water. Do not use bleach. Air-dry your stockings or dry them in a clothes dryer on low heat.  Replace your stockings every 3-6 months.  If skin moisturizing is part of your treatment plan, apply lotion or cream at night so that your skin will be dry when you put on the stockings in the morning. It is harder to put the stockings on when you have lotion on your legs or feet. Contact a health care provider if: Remove your stockings and seek medical care if:  You have a feeling of pins and needles in your feet or legs.  You have any new changes in your skin.  You have skin lesions that are getting worse.  You have swelling or pain that is getting worse. Get help right away if:  You have numbness or tingling in your lower legs that does not get better right after you take the stockings off.  Your toes or feet become cold and blue.  You develop open sores or red spots on your legs that do not go away.  You see or feel a warm spot on your leg.  You have new swelling or soreness in your leg.  You are short of breath or you have chest pain for no reason.  You have a rapid or irregular heartbeat.  You feel light-headed or dizzy. This information is not intended to replace advice given to you by your health care provider. Make sure you discuss any questions you have with your health care provider. Document Released: 06/03/2009 Document Revised: 01/04/2016 Document Reviewed: 07/14/2014 Elsevier Interactive Patient Education  2017 [employer].
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
## 2020-08-05 ENCOUNTER — Telehealth: Payer: Self-pay | Admitting: Family Medicine

## 2020-08-05 DIAGNOSIS — L03116 Cellulitis of left lower limb: Secondary | ICD-10-CM

## 2020-08-05 MED ORDER — CEPHALEXIN 500 MG PO CAPS: 500.0000 mg | ORAL_CAPSULE | Freq: Three times a day (TID) | ORAL | 0 refills | Status: DC

## 2020-08-05 NOTE — Telephone Encounter (Signed)
Pt called and states that he still has the cellulitis on his leg he wants to know If you want him to continue taking the keflux  If so he needs a refill on it  Pt uses [facility], [address] AT [place]

## 2020-08-05 NOTE — Telephone Encounter (Signed)
Pt was advised [facility] 

## 2020-08-05 NOTE — Telephone Encounter (Signed)
Let him know that I called the antibiotic in for him.

## 2020-08-22 ENCOUNTER — Ambulatory Visit (HOSPITAL_COMMUNITY): Payer: 59 | Attending: Cardiology

## 2020-08-22 ENCOUNTER — Other Ambulatory Visit: Payer: Self-pay

## 2020-08-22 DIAGNOSIS — I350 Nonrheumatic aortic (valve) stenosis: Secondary | ICD-10-CM | POA: Diagnosis not present

## 2020-08-22 LAB — ECHOCARDIOGRAM COMPLETE
AR max vel: 0.85 cm2
AV Area VTI: 0.78 cm2
AV Area mean vel: 0.88 cm2
AV Mean grad: 34 mmHg
AV Peak grad: 59.9 mmHg
Ao pk vel: 3.87 m/s
Area-P 1/2: 3.08 cm2
P 1/2 time: 1022 msec
S' Lateral: 3.7 cm

## 2020-08-22 MED ORDER — PERFLUTREN LIPID MICROSPHERE
1.0000 mL | INTRAVENOUS | Status: AC | PRN
  Administered 2020-08-22: 2 mL via INTRAVENOUS

## 2020-08-26 ENCOUNTER — Other Ambulatory Visit: Payer: Self-pay

## 2020-08-26 ENCOUNTER — Ambulatory Visit (HOSPITAL_COMMUNITY)
Admission: RE | Admit: 2020-08-26 | Discharge: 2020-08-26 | Disposition: A | Discharge status: Home / Self Care | Payer: 59 | Source: Ambulatory Visit | Attending: Cardiology | Admitting: Cardiology

## 2020-08-26 DIAGNOSIS — R600 Localized edema: Secondary | ICD-10-CM

## 2020-09-05 ENCOUNTER — Encounter: Payer: 59 | Admitting: Family Medicine

## 2020-09-15 ENCOUNTER — Ambulatory Visit (INDEPENDENT_AMBULATORY_CARE_PROVIDER_SITE_OTHER): Payer: 59 | Admitting: Family Medicine

## 2020-09-15 ENCOUNTER — Other Ambulatory Visit: Payer: Self-pay

## 2020-09-15 ENCOUNTER — Encounter: Payer: Self-pay | Admitting: Family Medicine

## 2020-09-15 VITALS — BP 110/70 | HR 73 | Temp 97.0°F | Ht 74.0 in | Wt >= 6400 oz

## 2020-09-15 DIAGNOSIS — I10 Essential (primary) hypertension: Secondary | ICD-10-CM | POA: Diagnosis not present

## 2020-09-15 DIAGNOSIS — Z23 Encounter for immunization: Secondary | ICD-10-CM

## 2020-09-15 DIAGNOSIS — I350 Nonrheumatic aortic (valve) stenosis: Secondary | ICD-10-CM

## 2020-09-15 DIAGNOSIS — Z800 Family history of malignant neoplasm of digestive organs: Secondary | ICD-10-CM

## 2020-09-15 DIAGNOSIS — Z8673 Personal history of transient ischemic attack (TIA), and cerebral infarction without residual deficits: Secondary | ICD-10-CM

## 2020-09-15 DIAGNOSIS — G4733 Obstructive sleep apnea (adult) (pediatric): Secondary | ICD-10-CM

## 2020-09-15 DIAGNOSIS — M1712 Unilateral primary osteoarthritis, left knee: Secondary | ICD-10-CM

## 2020-09-15 DIAGNOSIS — J309 Allergic rhinitis, unspecified: Secondary | ICD-10-CM

## 2020-09-15 DIAGNOSIS — Z0000 Encounter for general adult medical examination without abnormal findings: Secondary | ICD-10-CM

## 2020-09-15 DIAGNOSIS — I480 Paroxysmal atrial fibrillation: Secondary | ICD-10-CM | POA: Diagnosis not present

## 2020-09-15 DIAGNOSIS — I83892 Varicose veins of left lower extremities with other complications: Secondary | ICD-10-CM

## 2020-09-15 DIAGNOSIS — H903 Sensorineural hearing loss, bilateral: Secondary | ICD-10-CM

## 2020-09-15 MED ORDER — HYDROCODONE-ACETAMINOPHEN 5-325 MG PO TABS: 1.0000 | ORAL_TABLET | Freq: Four times a day (QID) | ORAL | 0 refills | Status: DC | PRN

## 2020-09-15 NOTE — Patient Instructions (Signed)
We will set her up to see gastroenterology. We will send her to the medical weight loss. Use the support stockings come back later in the year tetanus shot

## 2020-09-15 NOTE — Progress Notes (Unsigned)
   Subjective:    Patient ID: David [name], male    DOB: [birth year], 65 y.o.   MRN: [id number]  HPI He is here for complete examination.  He has been on antibiotics for his left lower extremity cellulitis.  This seems to have quieted down but still has significant amount of edema.  He has had recent vascular evaluation which was apparently negative.  He does experience pain with testing and states that usually a half of the Tylenol with codeine medication helps quiet this thing down.  He continues to be followed by cardiology for his PAF.  He continues on Xarelto.  He also has a history of aortic stenosis.  He has had a previous CVA he does have underlying OSA and is on BiPAP.  He is very happy with that.  A prescription was written for new device but he has not filled it yet.  He continues on valsartan/diltiazem and metoprolol.  He also is taking atorvastatin and having no difficulty with that.  Continues to complain of left knee pain and thinks he might need a replacement.  His allergies seem to be under good control.  His father apparently had colon cancer in his 105s.  He had a did have a previous Cologuard several years ago which was negative.   Review of Systems  All other systems reviewed and are negative.      Objective:   Physical Exam Alert and in no distress. Tympanic membranes and canals are normal. Pharyngeal area is normal. Neck is supple without adenopathy or thyromegaly. Cardiac exam shows an irregular  rhythm with a 2/6 SEM,no gallops. Lungs are clear to auscultation.  Left lower extremity does show tense edema especially over the anterior portion, slightly erythematous and slightly warm.       Assessment & Plan:  Routine general medical examination at a health care facility - Plan: CBC with Differential/Platelet, Comprehensive metabolic panel, Lipid panel  PAF (paroxysmal atrial fibrillation) (HCC)  Morbid obesity due to excess calories ([place]) - Plan: Amb Ref to Medical Weight  Management  Essential hypertension  Nonrheumatic aortic valve stenosis  Family history of colon cancer in father - Plan: Ambulatory referral to Gastroenterology  OSA treated with BiPAP  Primary hypertension  Symptomatic varicose veins of left lower extremity  Allergic rhinitis, unspecified seasonality, unspecified trigger  History of CVA (cerebrovascular accident)  Sensorineural hearing loss (SNHL), bilateral  Need for COVID-19 vaccine - Plan: Pfizer SARS-COV-2 Vaccine  Arthritis of left knee - Plan: DG Knee Complete 4 Views Left, HYDROcodone-acetaminophen (NORCO/VICODIN) 5-325 MG tablet He is to continue his present medication regimen.  He is interested in weight loss and I will therefore refer him to medical weight loss and wellness.  Continue on his BiPAP.  No therapy necessarily for the allergies at the present time.  Presently he is not using hearing aids and is not interested in that.  Discussed the treatment of his knee pain with him and explained that more energy and effort needs to be put into weight loss hopefully prior to needing any knee replacement. Continue to see cardiology.  Referral will be made for colon cancer screening. Encouraged him to use support hose on his leg to help with the edema.  He is to get a Tdap later.  Covid vaccine given today.

## 2020-09-16 LAB — COMPREHENSIVE METABOLIC PANEL
ALT: 28 IU/L (ref 0–44)
AST: 22 IU/L (ref 0–40)
Albumin/Globulin Ratio: 1.3 (ref 1.2–2.2)
Albumin: 4.5 g/dL (ref 3.8–4.8)
Alkaline Phosphatase: 119 IU/L (ref 44–121)
BUN/Creatinine Ratio: 18 (ref 10–24)
BUN: 17 mg/dL (ref 8–27)
Bilirubin Total: 0.8 mg/dL (ref 0.0–1.2)
CO2: 25 mmol/L (ref 20–29)
Calcium: 9.5 mg/dL (ref 8.6–10.2)
Chloride: 103 mmol/L (ref 96–106)
Creatinine, Ser: 0.96 mg/dL (ref 0.76–1.27)
GFR calc Af Amer: 97 mL/min/{1.73_m2} (ref >59)
GFR calc non Af Amer: 84 mL/min/{1.73_m2} (ref >59)
Globulin, Total: 3.6 g/dL (ref 1.5–4.5)
Glucose: 83 mg/dL (ref 65–99)
Potassium: 4.9 mmol/L (ref 3.5–5.2)
Sodium: 142 mmol/L (ref 134–144)
Total Protein: 8.1 g/dL (ref 6.0–8.5)

## 2020-09-16 LAB — CBC WITH DIFFERENTIAL/PLATELET
Basophils Absolute: 0.1 10*3/uL (ref 0.0–0.2)
Basos: 1 %
EOS (ABSOLUTE): 0.3 10*3/uL (ref 0.0–0.4)
Eos: 4 %
Hematocrit: 42.7 % (ref 37.5–51.0)
Hemoglobin: 14.3 g/dL (ref 13.0–17.7)
Immature Grans (Abs): 0 10*3/uL (ref 0.0–0.1)
Immature Granulocytes: 0 %
Lymphocytes Absolute: 2.2 10*3/uL (ref 0.7–3.1)
Lymphs: 30 %
MCH: 29.4 pg (ref 26.6–33.0)
MCHC: 33.5 g/dL (ref 31.5–35.7)
MCV: 88 fL (ref 79–97)
Monocytes Absolute: 0.6 10*3/uL (ref 0.1–0.9)
Monocytes: 8 %
Neutrophils Absolute: 4.1 10*3/uL (ref 1.4–7.0)
Neutrophils: 57 %
Platelets: 199 10*3/uL (ref 150–450)
RBC: 4.87 x10E6/uL (ref 4.14–5.80)
RDW: 14.1 % (ref 11.6–15.4)
WBC: 7.4 10*3/uL (ref 3.4–10.8)

## 2020-09-16 LAB — LIPID PANEL
Chol/HDL Ratio: 4.4 ratio (ref 0.0–5.0)
Cholesterol, Total: 127 mg/dL (ref 100–199)
HDL: 29 mg/dL — ABNORMAL LOW (ref >39)
LDL Chol Calc (NIH): 80 mg/dL (ref 0–99)
Triglycerides: 93 mg/dL (ref 0–149)
VLDL Cholesterol Cal: 18 mg/dL (ref 5–40)

## 2020-10-14 ENCOUNTER — Telehealth: Payer: Self-pay | Admitting: *Deleted

## 2020-10-14 NOTE — Telephone Encounter (Signed)
Patient has follow-up with Dr. Debara Pickett on 10/28/2020, recent echocardiogram suggested moderate to severe valve issue.  Will defer cardiac clearance to Dr. Debara Pickett.   Callback pool to update the reason behind next visit to include preop clearance.

## 2020-10-14 NOTE — Telephone Encounter (Signed)
Pt has appt 10/28/20 with Dr. Debara Pickett, added Pre OP clearance note added to appt notes. Will forward to MD for upcoming appt. Will send FYI to requesting office pt has appt.

## 2020-10-14 NOTE — Telephone Encounter (Signed)
Clinical pharmacist to review Xarelto 

## 2020-10-14 NOTE — Telephone Encounter (Signed)
   [facility] Pre-operative Risk Assessment      Request for surgical clearance:  1. What type of surgery is being performed? colonscopy   2. When is this surgery scheduled? 11/11/20   3. What type of clearance is required (medical clearance vs. Pharmacy clearance to hold med vs. Both)? both  4. Are there any medications that need to be held prior to surgery and how long? Xarelto   5. Practice name and name of physician performing surgery? [facility] Dr. Benson [place]   6. What is the office phone number? [phone]   7.   What is the office fax number? [phone]  8.   Anesthesia type (None, local, MAC, general) ? propofol   Denecia Brunette A Gery Sabedra 10/14/2020, 3:05 PM  _________________________________________________________________

## 2020-10-14 NOTE — Telephone Encounter (Signed)
Patient with diagnosis of afib on Xarelto for anticoagulation.    Procedure: Colonoscopy Date of procedure: 11/11/20  CHA2DS2-VASc Score = 3  This indicates a 3.2% annual risk of stroke. The patient's score is based upon: CHF History: No HTN History: Yes Diabetes History: No Stroke History: Yes Vascular Disease History: No Age Score: 0 Gender Score: 0     CrCl 141 ml/min  Per office protocol, patient can hold Xarelto for only 1 day prior to procedure due to hx of stroke.    If a longer hold is need, this will require input from MD.

## 2020-10-17 ENCOUNTER — Other Ambulatory Visit: Payer: Self-pay | Admitting: Gastroenterology

## 2020-10-28 ENCOUNTER — Encounter: Payer: 59 | Admitting: Internal Medicine

## 2020-11-02 ENCOUNTER — Telehealth: Payer: Self-pay

## 2020-11-02 NOTE — Telephone Encounter (Signed)
   [facility] Pre-operative Risk Assessment    HEARTCARE STAFF: - Please ensure there is not already an duplicate clearance open for this procedure. - Under Visit Info/Reason for Call, type in Other and utilize the format Clearance MM/DD/YY or Clearance TBD. Do not use dashes or single digits. - If request is for dental extraction, please clarify the # of teeth to be extracted.  Request for surgical clearance:  1. What type of surgery is being performed? Colonoscopy   2. When is this surgery scheduled? 11/11/2020   3. What type of clearance is required (medical clearance vs. Pharmacy clearance to hold med vs. Both)? Medical  4. Are there any medications that need to be held prior to surgery and how long? N/A   5. Practice name and name of physician performing surgery? [facility] Dr. Carol Ada   6. What is the office phone number? [phone]   7.   What is the office fax number? [phone]  8.   Anesthesia type (None, local, MAC, general) ? Propofol   David [name] 11/02/2020, 4:32 PM  _________________________________________________________________   (provider comments below)

## 2020-11-02 NOTE — Telephone Encounter (Signed)
   Primary Cardiologist: [name], MD  Chart reviewed as part of pre-operative protocol coverage.  Left a voicemail for patient to call back for ongoing preop assessment.   Patient is on xarelto for history of paroxysmal atrial fibrillation which appears to be managed by his PCP. Recommendations for holding xarelto will be deferred to PCP.   Abigail Butts, PA-C 11/02/2020, 4:39 PM

## 2020-11-07 NOTE — Telephone Encounter (Signed)
  Patient with diagnosis of afib on Xarelto for anticoagulation.    Procedure: Colonoscopy Date of procedure: 11/11/20  CHA2DS2-VASc Score = 3  This indicates a 3.2% annual risk of stroke. The patient's score is based upon: CHF History: No HTN History: Yes Diabetes History: No Stroke History: Yes Vascular Disease History: No Age Score: 0 Gender Score: 0     CrCl 141 ml/min  Per office protocol, patient can hold Xarelto for only 1 day prior to procedure due to hx of stroke.    If a longer hold is need, this will require input from MD.

## 2020-11-07 NOTE — Telephone Encounter (Signed)
° °  Primary Cardiologist: [name], MD  Chart reviewed as part of pre-operative protocol coverage. Patient was contacted 11/07/2020 in reference to pre-operative risk assessment for pending surgery as outlined below.  Fredric Dine was last seen on 07/2020 by Dr. Debara Pickett with history of PAF, OSA, stroke, HTN, HLD, RBBB, aortic root dilation, severe obesity (last weight 427lb), reflux of left greater saphenous vein, and moderate to probably severe aortic stenosis - this was managed conservatively at that time but with question of future need for follow-up in valve clinic for symptoms.  I spoke with patient who denies any change since last OV 07/2020 (OV outlining chronic DOE, weight gain, LEE). No new accelerating symptoms. I will route to pharm for input on holding Xarelto and also route to Dr. Debara Pickett for input on whether we are OK to clear for colonoscopy given valve issue. The patient states his procedure is now scheduled for 4/8.  Dr. Debara Pickett - Please route response to P CV DIV PREOP (the pre-op pool). Thank you.   Charlie Pitter, PA-C 11/07/2020, 10:32 AM

## 2020-11-09 ENCOUNTER — Ambulatory Visit (INDEPENDENT_AMBULATORY_CARE_PROVIDER_SITE_OTHER): Payer: 59 | Admitting: Family Medicine

## 2020-11-09 ENCOUNTER — Other Ambulatory Visit: Payer: Self-pay

## 2020-11-09 VITALS — BP 122/80 | HR 92 | Temp 98.7°F | Wt >= 6400 oz

## 2020-11-09 DIAGNOSIS — H00036 Abscess of eyelid left eye, unspecified eyelid: Secondary | ICD-10-CM

## 2020-11-09 NOTE — Progress Notes (Signed)
   Subjective:    Patient ID: David [name], male    DOB: 1956/08/05, 65 y.o.   MRN: [id number]  HPI He has a several day history of swelling and tenderness to the left upper eyelid.  He thinks that he might of had some slight drainage from it yesterday.  It is quite uncomfortable.   Review of Systems     Objective:   Physical Exam Exam of the left eye shows swelling and erythema and a 1/2 cm raised tender lesion in the midportion of the eyelid.       Assessment & Plan:  Eyelid abscess, left - Plan: Ambulatory referral to Ophthalmology This appears to be an external type of an accessory not necessarily a stye.  He does have doxycycline at home which she will take.  He was scheduled for ophthalmology evaluation for possible I&D tomorrow.

## 2020-11-15 ENCOUNTER — Encounter: Payer: Self-pay | Admitting: Family Medicine

## 2020-11-18 ENCOUNTER — Encounter: Payer: Self-pay | Admitting: Family Medicine

## 2020-11-20 NOTE — Progress Notes (Signed)
Erroneous encounter

## 2020-11-22 ENCOUNTER — Other Ambulatory Visit: Payer: Self-pay

## 2020-11-23 ENCOUNTER — Other Ambulatory Visit (HOSPITAL_COMMUNITY)
Admission: RE | Admit: 2020-11-23 | Discharge: 2020-11-23 | Disposition: A | Discharge status: Home / Self Care | Payer: 59 | Source: Ambulatory Visit | Attending: Gastroenterology | Admitting: Gastroenterology

## 2020-11-23 DIAGNOSIS — Z01812 Encounter for preprocedural laboratory examination: Secondary | ICD-10-CM | POA: Insufficient documentation

## 2020-11-23 DIAGNOSIS — Z20822 Contact with and (suspected) exposure to covid-19: Secondary | ICD-10-CM | POA: Insufficient documentation

## 2020-11-23 LAB — SARS CORONAVIRUS 2 (TAT 6-24 HRS): SARS Coronavirus 2: NEGATIVE

## 2020-11-25 ENCOUNTER — Ambulatory Visit (HOSPITAL_COMMUNITY)
Admission: RE | Admit: 2020-11-25 | Discharge: 2020-11-25 | Disposition: A | Discharge status: Home / Self Care | Payer: 59 | Source: Ambulatory Visit | Attending: Gastroenterology | Admitting: Gastroenterology

## 2020-11-25 ENCOUNTER — Other Ambulatory Visit: Payer: Self-pay

## 2020-11-25 ENCOUNTER — Encounter (HOSPITAL_COMMUNITY): Payer: Self-pay | Admitting: Gastroenterology

## 2020-11-25 ENCOUNTER — Encounter (HOSPITAL_COMMUNITY)
Admission: RE | Disposition: A | Discharge status: Home / Self Care | Payer: Self-pay | Source: Ambulatory Visit | Attending: Gastroenterology

## 2020-11-25 ENCOUNTER — Ambulatory Visit (HOSPITAL_COMMUNITY): Payer: 59 | Admitting: Anesthesiology

## 2020-11-25 DIAGNOSIS — R195 Other fecal abnormalities: Secondary | ICD-10-CM | POA: Diagnosis present

## 2020-11-25 DIAGNOSIS — Z800 Family history of malignant neoplasm of digestive organs: Secondary | ICD-10-CM | POA: Diagnosis not present

## 2020-11-25 DIAGNOSIS — Q438 Other specified congenital malformations of intestine: Secondary | ICD-10-CM | POA: Insufficient documentation

## 2020-11-25 DIAGNOSIS — Z885 Allergy status to narcotic agent status: Secondary | ICD-10-CM | POA: Insufficient documentation

## 2020-11-25 DIAGNOSIS — Z8673 Personal history of transient ischemic attack (TIA), and cerebral infarction without residual deficits: Secondary | ICD-10-CM | POA: Insufficient documentation

## 2020-11-25 DIAGNOSIS — D122 Benign neoplasm of ascending colon: Secondary | ICD-10-CM | POA: Insufficient documentation

## 2020-11-25 DIAGNOSIS — K621 Rectal polyp: Secondary | ICD-10-CM | POA: Diagnosis not present

## 2020-11-25 DIAGNOSIS — D123 Benign neoplasm of transverse colon: Secondary | ICD-10-CM | POA: Insufficient documentation

## 2020-11-25 DIAGNOSIS — Z87891 Personal history of nicotine dependence: Secondary | ICD-10-CM | POA: Diagnosis not present

## 2020-11-25 HISTORY — PX: COLONOSCOPY WITH PROPOFOL: SHX5780

## 2020-11-25 HISTORY — DX: Other complications of anesthesia, initial encounter: T88.59XA

## 2020-11-25 HISTORY — PX: POLYPECTOMY: SHX5525

## 2020-11-25 HISTORY — PX: SUBMUCOSAL LIFTING INJECTION: SHX6855

## 2020-11-25 HISTORY — DX: Personal history of colonic polyps: Z86.010

## 2020-11-25 HISTORY — PX: SUBMUCOSAL TATTOO INJECTION: SHX6856

## 2020-11-25 HISTORY — PX: HEMOSTASIS CLIP PLACEMENT: SHX6857

## 2020-11-25 HISTORY — DX: Personal history of adenomatous and serrated colon polyps: Z86.0101

## 2020-11-25 SURGERY — COLONOSCOPY WITH PROPOFOL
Anesthesia: Monitor Anesthesia Care

## 2020-11-25 MED ORDER — PROPOFOL 10 MG/ML IV BOLUS
INTRAVENOUS | Status: AC
  Filled 2020-11-25: qty 20

## 2020-11-25 MED ORDER — MIDAZOLAM HCL 2 MG/2ML IJ SOLN
INTRAMUSCULAR | Status: AC
  Filled 2020-11-25: qty 2

## 2020-11-25 MED ORDER — PHENYLEPHRINE HCL-NACL 10-0.9 MG/250ML-% IV SOLN
INTRAVENOUS | Status: DC | PRN
  Administered 2020-11-25: 35 ug/min via INTRAVENOUS

## 2020-11-25 MED ORDER — PROPOFOL 500 MG/50ML IV EMUL
INTRAVENOUS | Status: DC | PRN
  Administered 2020-11-25: 125 ug/kg/min via INTRAVENOUS

## 2020-11-25 MED ORDER — MIDAZOLAM HCL 5 MG/5ML IJ SOLN
INTRAMUSCULAR | Status: DC | PRN
  Administered 2020-11-25 (×2): 1 mg via INTRAVENOUS

## 2020-11-25 MED ORDER — PROPOFOL 10 MG/ML IV BOLUS
INTRAVENOUS | Status: DC | PRN
  Administered 2020-11-25: 10 mg via INTRAVENOUS

## 2020-11-25 MED ORDER — SPOT INK MARKER SYRINGE KIT
PACK | SUBMUCOSAL | Status: AC
  Filled 2020-11-25: qty 5

## 2020-11-25 MED ORDER — SODIUM CHLORIDE 0.9 % IV SOLN: INTRAVENOUS | Status: DC

## 2020-11-25 MED ORDER — SPOT INK MARKER SYRINGE KIT
PACK | SUBMUCOSAL | Status: DC | PRN
  Administered 2020-11-25: 2 mL via SUBMUCOSAL

## 2020-11-25 MED ORDER — LACTATED RINGERS IV SOLN: INTRAVENOUS | Status: DC

## 2020-11-25 MED ORDER — ONDANSETRON HCL 4 MG/2ML IJ SOLN
INTRAMUSCULAR | Status: DC | PRN
  Administered 2020-11-25: 4 mg via INTRAVENOUS

## 2020-11-25 MED ORDER — PHENYLEPHRINE 40 MCG/ML (10ML) SYRINGE FOR IV PUSH (FOR BLOOD PRESSURE SUPPORT)
PREFILLED_SYRINGE | INTRAVENOUS | Status: DC | PRN
  Administered 2020-11-25: 80 ug via INTRAVENOUS

## 2020-11-25 MED ORDER — PROPOFOL 500 MG/50ML IV EMUL
INTRAVENOUS | Status: AC
  Filled 2020-11-25: qty 50

## 2020-11-25 SURGICAL SUPPLY — 21 items

## 2020-11-25 NOTE — Op Note (Signed)
[facility] Patient Name: David [name] Procedure Date: 11/25/2020 MRN: [id number] Attending MD: Carol Ada , MD Date of Birth: 1956-01-02 CSN: [id number] Age: 65 Admit Type: Outpatient Procedure:                Colonoscopy Indications:              Positive Cologuard test Providers:                Carol Ada, MD, Jerene Pitch Person, Carmie End,                            RN, Ladona Ridgel, Technician Referring MD:              Medicines:                Propofol per Anesthesia Complications:            No immediate complications. Estimated Blood Loss:     Estimated blood loss was minimal. Procedure:                Pre-Anesthesia Assessment:                           - Prior to the procedure, a History and Physical                            was performed, and patient medications and                            allergies were reviewed. The patient's tolerance of                            previous anesthesia was also reviewed. The risks                            and benefits of the procedure and the sedation                            options and risks were discussed with the patient.                            All questions were answered, and informed consent                            was obtained. Prior Anticoagulants: The patient has                            taken no previous anticoagulant or antiplatelet                            agents. ASA Grade Assessment: III - A patient with                            severe systemic disease. After reviewing the risks  and benefits, the patient was deemed in                            satisfactory condition to undergo the procedure.                           - Sedation was administered by an anesthesia                            professional. Deep sedation was attained.                           After obtaining informed consent, the colonoscope                            was passed under direct  vision. Throughout the                            procedure, the patient's blood pressure, pulse, and                            oxygen saturations were monitored continuously. The                            CF-HQ190L ([id number] Olympus colonoscope was                            introduced through the anus and advanced to the the                            cecum, identified by appendiceal orifice and                            ileocecal valve. The colonoscopy was technically                            difficult and complex due to a redundant colon and                            significant looping. Successful completion of the                            procedure was aided by using manual pressure and                            straightening and shortening the scope to obtain                            bowel loop reduction. The patient tolerated the                            procedure well. The quality of the bowel  preparation was good. The ileocecal valve,                            appendiceal orifice, and rectum were photographed. Scope In: 1:04:24 PM Scope Out: 1:50:26 PM Scope Withdrawal Time: 0 hours 39 minutes 22 seconds  Total Procedure Duration: 0 hours 46 minutes 2 seconds  Findings:      Three sessile polyps were found in the rectum, transverse colon and       ascending colon. The polyps were 2 to 3 mm in size. These polyps were       removed with a cold snare. Resection and retrieval were complete.      A 15 mm polyp was found in the transverse colon. The polyp was sessile.       The polyp was removed with a cold snare. The polyp was removed with a       saline injection-lift technique using a cold snare. The polyp was       removed with a piecemeal technique using a cold snare. Resection and       retrieval were complete. Area was tattooed with an injection of 2 mL of       Spot (carbon black). To prevent bleeding after the polypectomy, four        hemostatic clips were successfully placed (MR conditional). There was no       bleeding at the end of the procedure.      In the proximal transverse colon a large sessile polyp was identified.       There was a central derpession and one ml of O'Rise was injected to lift       the lesion. An additional 1 ml was injected at the central depression       and it appeared to lift. In a piecemeal fashion the polyp was resected.       The last place of resection was the central depression and resection was       accomplished without any immediate complications. The polypectomy site       was successfully closed with four hemoclips. Impression:               - Three 2 to 3 mm polyps in the rectum, in the                            transverse colon and in the ascending colon,                            removed with a cold snare. Resected and retrieved.                           - One 15 mm polyp in the transverse colon, removed                            with a cold snare, removed using injection-lift and                            a cold snare and removed piecemeal using a cold  snare. Resected and retrieved. Tattooed. Clips (MR                            conditional) were placed. Moderate Sedation:      Not Applicable - Patient had care per Anesthesia. Recommendation:           - Patient has a contact number available for                            emergencies. The signs and symptoms of potential                            delayed complications were discussed with the                            patient. Return to normal activities tomorrow.                            Written discharge instructions were provided to the                            patient.                           - Resume previous diet.                           - Continue present medications.                           - Await pathology results.                           - Repeat colonoscopy in 6 months  for surveillance. Procedure Code(s):        --- Professional ---                           [phone], Colonoscopy, flexible; with removal of                            tumor(s), polyp(s), or other lesion(s) by snare                            technique                           [id number], Colonoscopy, flexible; with directed                            submucosal injection(s), any substance Diagnosis Code(s):        --- Professional ---                           K62.1, Rectal polyp                           K63.5, Polyp of colon  R19.5, Other fecal abnormalities CPT copyright 2019 American [employer] Association. All rights reserved. The codes documented in this report are preliminary and upon coder review may  be revised to meet current compliance requirements. Carol Ada, MD Carol Ada, MD 11/25/2020 2:10:43 PM This report has been signed electronically. Number of Addenda: 0

## 2020-11-25 NOTE — Anesthesia Preprocedure Evaluation (Signed)
Anesthesia Evaluation  Patient identified by MRN, date of birth, ID band Patient awake    Reviewed: Allergy & Precautions, NPO status , Patient's Chart, lab work & pertinent test results  History of Anesthesia Complications Negative for: history of anesthetic complications  Airway Mallampati: IV  TM Distance: >3 FB Neck ROM: Full    Dental   Pulmonary sleep apnea and Continuous Positive Airway Pressure Ventilation , former smoker,    Pulmonary exam normal        Cardiovascular hypertension, Normal cardiovascular exam+ dysrhythmias Atrial Fibrillation + Valvular Problems/Murmurs AS      Neuro/Psych CVA    GI/Hepatic Neg liver ROS, GERD  ,  Endo/Other  Morbid obesity  Renal/GU negative Renal ROS  negative genitourinary   Musculoskeletal negative musculoskeletal ROS (+)   Abdominal   Peds  Hematology Xarelto   Anesthesia Other Findings  Echo 08/22/20: EF 60-65%, g2dd, normal RV function, mild/mod LAD, mild/mod RAD, mild AI, mod-severe AS (AVA 0.78, MG 34), mild dilatation of the ascending aorta measuring 40 mm  Reproductive/Obstetrics                            Anesthesia Physical Anesthesia Plan  ASA: IV  Anesthesia Plan: MAC   Post-op Pain Management:    Induction: Intravenous  PONV Risk Score and Plan: 1 and Propofol infusion, TIVA and Treatment may vary due to age or medical condition  Airway Management Planned: Natural Airway, Nasal Cannula and Simple Face Mask  Additional Equipment: None  Intra-op Plan:   Post-operative Plan:   Informed Consent: I have reviewed the patients History and Physical, chart, labs and discussed the procedure including the risks, benefits and alternatives for the proposed anesthesia with the patient or authorized representative who has indicated his/her understanding and acceptance.       Plan Discussed with:   Anesthesia Plan Comments:          Anesthesia Quick Evaluation

## 2020-11-25 NOTE — Anesthesia Procedure Notes (Signed)
Procedure Name: MAC Date/Time: 11/25/2020 12:53 PM Performed by: Maxwell Caul, CRNA Pre-anesthesia Checklist: Patient identified, Emergency Drugs available, Suction available and Patient being monitored Oxygen Delivery Method: Simple face mask

## 2020-11-25 NOTE — Anesthesia Postprocedure Evaluation (Signed)
Anesthesia Post Note  Patient: David [name]  Procedure(s) Performed: COLONOSCOPY WITH PROPOFOL (N/A ) POLYPECTOMY SUBMUCOSAL LIFTING INJECTION SUBMUCOSAL TATTOO INJECTION HEMOSTASIS CLIP PLACEMENT     Patient location during evaluation: Endoscopy Anesthesia Type: MAC Level of consciousness: awake and alert Pain management: pain level controlled Vital Signs Assessment: post-procedure vital signs reviewed and stable Respiratory status: spontaneous breathing, nonlabored ventilation and respiratory function stable Cardiovascular status: blood pressure returned to baseline and stable Postop Assessment: no apparent nausea or vomiting Anesthetic complications: no   No complications documented.  Last Vitals:  Vitals:   11/25/20 1400 11/25/20 1410  BP: 139/75 (!) 122/103  Pulse: 70 80  Resp: 15 18  Temp: 36.6 C   SpO2: 98% 96%    Last Pain:  Vitals:   11/25/20 1410  TempSrc:   PainSc: 0-No pain                 Lidia Collum

## 2020-11-25 NOTE — Transfer of Care (Signed)
Immediate Anesthesia Transfer of Care Note  Patient: ABDOU STOCKS  Procedure(s) Performed: COLONOSCOPY WITH PROPOFOL (N/A ) POLYPECTOMY SUBMUCOSAL LIFTING INJECTION SUBMUCOSAL TATTOO INJECTION HEMOSTASIS CLIP PLACEMENT  Patient Location: PACU and Endoscopy Unit  Anesthesia Type:MAC  Level of Consciousness: awake, alert  and oriented  Airway & Oxygen Therapy: Patient Spontanous Breathing and Patient connected to face mask oxygen  Post-op Assessment: Report given to RN and Post -op Vital signs reviewed and stable  Post vital signs: Reviewed and stable  Last Vitals:  Vitals Value Taken Time  BP    Temp    Pulse 77 11/25/20 1359  Resp 18 11/25/20 1359  SpO2 98 % 11/25/20 1359  Vitals shown include unvalidated device data.  Last Pain:  Vitals:   11/25/20 1140  TempSrc: Oral  PainSc: 4          Complications: No complications documented.

## 2020-11-25 NOTE — Discharge Instructions (Signed)

## 2020-11-25 NOTE — H&P (Signed)
David [name] HPI: This 65 year old white male presents to the office for colorectal cancer screening. He usually has 1 BM per day but can go up to 3 days without a BM. He has blood in the stools usually when he strains to facilitate a BM. He has a good appetite and his weight has been stable. He denies having any complaints of abdominal pain, nausea, vomiting, acid reflux, dysphagia or odynophagia. He denies having a family history of celiac sprue or IBD. His father was diagnosed with colon cancer at the age of 32. The patient reports having a positive Cologuard stool DNA test last year [this was apparently ordered by his PCP but we do not have access to those records at the present time].  Past Medical History:  Diagnosis Date  . Allergy    RHINITIS  . Arthritis    thumb  . GERD (gastroesophageal reflux disease)    "not really"  . History of echocardiogram 03/29/2009   EF 50-55%; mild concentric LVH;   . History of nuclear stress test 08/28/2007   moderate diaphragmatic attenuation; EKG neg for ischemia   . Hyperlipidemia   . Hypertension    takes Diovan and Toprol  . Obesity   . PAF (paroxysmal atrial fibrillation) (HCC)    associated with OSA  . Right bundle branch block   . Rosacea   . Shortness of breath    With activity  . Sleep apnea    CPAP  . Smoker   . Systolic murmur     Past Surgical History:  Procedure Laterality Date  . FINGER SURGERY     Reconstruction of Left thumb and L index finger.  Reconstruction   . KNEE ARTHROSCOPY    . SHOULDER CLOSED REDUCTION     Procedure: CLOSED REDUCTION SHOULDER;  Surgeon: Lawrence Santiago [facility];  Location: [place];  Service: Orthopedics;  Laterality: Right;  . SHOULDER OPEN ROTATOR CUFF REPAIR  08/11/2011   Procedure: ROTATOR CUFF REPAIR SHOULDER OPEN;  Surgeon: Willa Frater III;  Location: [place];  Service: Orthopedics;  Laterality: Right;  . TONSILLECTOMY     as a child    Family History  Problem Relation Age of Onset  .  Heart disease Mother   . Cancer - Prostate Maternal Grandfather   . Cancer - Prostate Paternal Grandfather   . Cancer - Colon Father   . Heart attack Father 61  . Cancer Paternal Uncle   . Anesthesia problems Neg Hx   . Stroke Neg Hx     Social History:  reports that he quit smoking about 4 years ago. His smoking use included cigarettes. He has a 40.00 pack-year smoking history. He has never used smokeless tobacco. He reports current alcohol use of about 11.0 standard drinks of alcohol per week. He reports that he does not use drugs.  Allergies:  Allergies  Allergen Reactions  . Codeine Shortness Of Breath and Swelling  . Morphine And Related Shortness Of Breath and Swelling    Medications: Scheduled: Continuous:  No results found for this or any previous visit (from the past 24 hour(s)).   No results found.  ROS:  As stated above in the HPI otherwise negative.  Height 6\' 4"  (1.93 m), weight (!) 192.8 kg.    PE: Gen: NAD, Alert and Oriented HEENT:  [place]/AT, EOMI Neck: Supple, no LAD Lungs: CTA Bilaterally CV: RRR without M/G/R ABD: Soft, NTND, +BS Ext: No C/C/E  Assessment/Plan: 1) Positive Cologuard - Colonoscopy.  Dayleen Beske D 11/25/2020, 10:38 AM

## 2020-11-28 ENCOUNTER — Encounter (HOSPITAL_COMMUNITY): Payer: Self-pay | Admitting: Gastroenterology

## 2020-11-28 LAB — SURGICAL PATHOLOGY

## 2020-11-28 NOTE — Telephone Encounter (Signed)
Please see conversation below regarding pre-op eval for colonoscopy scheduled 4/1. Pharmacy says it's OK to hold Xarelto for 1 day prior to procedure. He has h/o afib with CHADSVASC of 3.   Dr. Debara Pickett- please route response to P CV DIV PREOP. Thanks.

## 2020-11-28 NOTE — Telephone Encounter (Signed)
Ok to hold Xarelto for 2 days prior to procedure and restart as soon as practical after  Dr Lemmie Evens

## 2020-12-08 ENCOUNTER — Encounter: Payer: Self-pay | Admitting: Family Medicine

## 2020-12-21 ENCOUNTER — Other Ambulatory Visit: Payer: Self-pay | Admitting: Family Medicine

## 2020-12-21 DIAGNOSIS — F419 Anxiety disorder, unspecified: Secondary | ICD-10-CM

## 2020-12-22 NOTE — Telephone Encounter (Signed)
Is this okay to refill? 

## 2020-12-29 ENCOUNTER — Ambulatory Visit (INDEPENDENT_AMBULATORY_CARE_PROVIDER_SITE_OTHER): Payer: 59 | Admitting: Internal Medicine

## 2020-12-29 ENCOUNTER — Encounter: Payer: Self-pay | Admitting: Internal Medicine

## 2020-12-29 ENCOUNTER — Other Ambulatory Visit: Payer: Self-pay

## 2020-12-29 VITALS — BP 127/71 | HR 76 | Ht 75.0 in | Wt >= 6400 oz

## 2020-12-29 DIAGNOSIS — I350 Nonrheumatic aortic (valve) stenosis: Secondary | ICD-10-CM | POA: Diagnosis not present

## 2020-12-29 DIAGNOSIS — I4819 Other persistent atrial fibrillation: Secondary | ICD-10-CM | POA: Diagnosis not present

## 2020-12-29 DIAGNOSIS — R600 Localized edema: Secondary | ICD-10-CM | POA: Diagnosis not present
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
## 2020-12-29 NOTE — Patient Instructions (Signed)
Medication Instructions:  Continue current medications  *If you need a refill on your cardiac medications before your next appointment, please call your pharmacy*   Lab Work: None Ordered   Testing/Procedures: Your physician has requested that you have an echocardiogram. Echocardiography is a painless test that uses sound waves to create images of your heart. It provides your doctor with information about the size and shape of your heart and how well your heart's chambers and valves are working. This procedure takes approximately one hour. There are no restrictions for this procedure.   Follow-Up: At [facility], you and your health needs are our priority.  As part of our continuing mission to provide you with exceptional heart care, we have created designated Provider Care Teams.  These Care Teams include your primary Cardiologist (physician) and Advanced Practice Providers (APPs -  Physician Assistants and Nurse Practitioners) who all work together to provide you with the care you need, when you need it.  We recommend signing up for the patient portal called "MyChart".  Sign up information is provided on this After Visit Summary.  MyChart is used to connect with patients for Virtual Visits (Telemedicine).  Patients are able to view lab/test results, encounter notes, upcoming appointments, etc.  Non-urgent messages can be sent to your provider as well.   To learn more about what you can do with MyChart, go to [email or web address].    Your next appointment:   6 month(s)  The format for your next appointment:   In Person  Provider:   You may see [name], MD or one of the following Advanced Practice Providers on your designated Care Team:    Almyra Deforest, PA-C  Fabian Sharp, PA-C or   Roby Lofts, [place]

## 2021-02-15 ENCOUNTER — Encounter: Payer: 59 | Admitting: Family Medicine

## 2021-03-02 ENCOUNTER — Other Ambulatory Visit: Payer: Self-pay

## 2021-03-02 ENCOUNTER — Ambulatory Visit (HOSPITAL_COMMUNITY): Payer: 59 | Attending: Cardiology

## 2021-03-02 DIAGNOSIS — I350 Nonrheumatic aortic (valve) stenosis: Secondary | ICD-10-CM

## 2021-03-02 MED ORDER — PERFLUTREN LIPID MICROSPHERE
1.0000 mL | INTRAVENOUS | Status: AC | PRN
  Administered 2021-03-02: 2 mL via INTRAVENOUS

## 2021-03-03 LAB — ECHOCARDIOGRAM COMPLETE
AR max vel: 0.7 cm2
AV Area VTI: 0.67 cm2
AV Area mean vel: 0.71 cm2
AV Mean grad: 49 mmHg
AV Peak grad: 82.1 mmHg
Ao pk vel: 4.53 m/s
Area-P 1/2: 3.42 cm2
S' Lateral: 3.5 cm

## 2021-03-06 ENCOUNTER — Other Ambulatory Visit: Payer: Self-pay | Admitting: *Deleted

## 2021-03-06 ENCOUNTER — Ambulatory Visit (INDEPENDENT_AMBULATORY_CARE_PROVIDER_SITE_OTHER): Payer: 59 | Admitting: Family Medicine

## 2021-03-06 ENCOUNTER — Other Ambulatory Visit: Payer: Self-pay

## 2021-03-06 VITALS — BP 130/86 | HR 92 | Temp 99.3°F | Wt >= 6400 oz

## 2021-03-06 DIAGNOSIS — I10 Essential (primary) hypertension: Secondary | ICD-10-CM

## 2021-03-06 DIAGNOSIS — Z87891 Personal history of nicotine dependence: Secondary | ICD-10-CM

## 2021-03-06 DIAGNOSIS — I350 Nonrheumatic aortic (valve) stenosis: Secondary | ICD-10-CM | POA: Diagnosis not present

## 2021-03-06 DIAGNOSIS — Z860101 Personal history of adenomatous and serrated colon polyps: Secondary | ICD-10-CM

## 2021-03-06 DIAGNOSIS — Z86010 Personal history of colonic polyps: Secondary | ICD-10-CM

## 2021-03-06 DIAGNOSIS — I83892 Varicose veins of left lower extremities with other complications: Secondary | ICD-10-CM

## 2021-03-06 DIAGNOSIS — I719 Aortic aneurysm of unspecified site, without rupture: Secondary | ICD-10-CM

## 2021-03-06 DIAGNOSIS — I872 Venous insufficiency (chronic) (peripheral): Secondary | ICD-10-CM

## 2021-03-06 DIAGNOSIS — Z8673 Personal history of transient ischemic attack (TIA), and cerebral infarction without residual deficits: Secondary | ICD-10-CM

## 2021-03-06 DIAGNOSIS — I480 Paroxysmal atrial fibrillation: Secondary | ICD-10-CM | POA: Diagnosis not present

## 2021-03-06 DIAGNOSIS — Z800 Family history of malignant neoplasm of digestive organs: Secondary | ICD-10-CM

## 2021-03-06 DIAGNOSIS — J309 Allergic rhinitis, unspecified: Secondary | ICD-10-CM

## 2021-03-06 DIAGNOSIS — G4733 Obstructive sleep apnea (adult) (pediatric): Secondary | ICD-10-CM

## 2021-03-06 NOTE — Progress Notes (Signed)
   Subjective:    Patient ID: David [name], male    DOB: 1956/05/28, 65 y.o.   MRN: [id number]  HPI He is here for a med check appointment.  He is being followed by cardiology for his PAF and AS.  Recent echocardiogram shows the AS is getting worse and he is being referred to [facility] for further work-up.  He also recently had a colonoscopy which did show evidence of colonic polyps and apparently is scheduled for follow-up colonoscopy in the near future.  The evaluation also showed evidence of ascending aortic dilatation. he has lost about 10 pounds since his last visit however his knee pain does interfere with his physical activities as well as the symptomatic AS of weakness and shortness of breath.  He also continues have intermittent left leg pain from his previous cellulitis.  He does have a previous history of CVA but presently is having no weakness, numbness or tingling.  His allergies seem to be under good control.  He does have OSA and is on BiPAP and seems to be getting good results with that.  His medications were reviewed. Review of Systems     Objective:   Physical Exam Alert and in no distress. Tympanic membranes and canals are normal. Pharyngeal area is normal. Neck is supple without adenopathy or thyromegaly. Cardiac exam shows an irregular rhythm with 3/6 SEM.. Lungs are clear to auscultation.        Assessment & Plan:  Nonrheumatic aortic valve stenosis  PAF (paroxysmal atrial fibrillation) (HCC)  Morbid obesity due to excess calories (HCC)  Essential hypertension  OSA treated with BiPAP  Family history of colon cancer in father  Allergic rhinitis, unspecified seasonality, unspecified trigger  History of CVA (cerebrovascular accident)  Venous insufficiency  Symptomatic varicose veins of left lower extremity  Aortic aneurysm without rupture, unspecified portion of aorta ([place])  Primary hypertension  Former smoker  Hx of adenomatous colonic polyps I discussed the  impending valve surgery with him.  Encouraged him to get it done as soon as possible in order to help with his overall health and wellbeing.  We can then work further on his weight reduction and having the knee surgery.  We will also be interesting to see how his lower extremities were with sponsor better circulation.  We will follow-up on the ascending aortic issue as well as order an abdominal ultrasound since he is a former smoker.  He will continue on his present medication regimen.

## 2021-03-08 ENCOUNTER — Encounter: Payer: Self-pay | Admitting: Family Medicine

## 2021-03-09 NOTE — Telephone Encounter (Signed)
Called pt to set up virtual for tomorrow and he states he already has one scheduled.

## 2021-03-10 ENCOUNTER — Other Ambulatory Visit: Payer: Self-pay

## 2021-03-10 ENCOUNTER — Ambulatory Visit (INDEPENDENT_AMBULATORY_CARE_PROVIDER_SITE_OTHER): Payer: 59 | Admitting: Family Medicine

## 2021-03-10 ENCOUNTER — Encounter: Payer: Self-pay | Admitting: Family Medicine

## 2021-03-10 VITALS — BP 138/82 | HR 78 | Temp 98.7°F | Wt >= 6400 oz

## 2021-03-10 DIAGNOSIS — L03119 Cellulitis of unspecified part of limb: Secondary | ICD-10-CM

## 2021-03-10 DIAGNOSIS — M1712 Unilateral primary osteoarthritis, left knee: Secondary | ICD-10-CM | POA: Insufficient documentation

## 2021-03-10 MED ORDER — CEPHALEXIN 500 MG PO CAPS: 500.0000 mg | ORAL_CAPSULE | Freq: Four times a day (QID) | ORAL | 0 refills | Status: DC

## 2021-03-10 MED ORDER — HYDROCODONE-ACETAMINOPHEN 5-325 MG PO TABS: 1.0000 | ORAL_TABLET | Freq: Four times a day (QID) | ORAL | 0 refills | Status: DC | PRN

## 2021-03-10 NOTE — Progress Notes (Signed)
   Subjective:    Patient ID: URIAN [name], male    DOB: [birth year], 65 y.o.   MRN: [id number]  HPI He states that Tuesday evening he had the sudden onset of calf pain, right greater than left.  She is mainly in the anterior area.  He has a previous history of significant cellulitis requiring admission to the hospital and subsequent long-term treatment with Keflex.  He is concerned that this is reoccurring.   Review of Systems     Objective:   Physical Exam Alert and complaining of bilateral anterior shin pain.  A photo was taken of both shins.  There is more erythema on the right and his tender to palpation with just minor light touch.       Assessment & Plan:  Cellulitis of lower extremity, unspecified laterality - Plan: cephALEXin (KEFLEX) 500 MG capsule, HYDROcodone-acetaminophen (NORCO/VICODIN) 5-325 MG tablet I will treat him with Keflex which is worse in the past and also give pain medication.  He will call me if he has any difficulties otherwise I will see him in 1 month.  He was comfortable with that.

## 2021-03-16 ENCOUNTER — Encounter: Payer: Self-pay | Admitting: Cardiovascular Disease

## 2021-03-16 ENCOUNTER — Other Ambulatory Visit: Payer: Self-pay

## 2021-03-16 ENCOUNTER — Ambulatory Visit (INDEPENDENT_AMBULATORY_CARE_PROVIDER_SITE_OTHER): Payer: 59 | Admitting: Cardiovascular Disease

## 2021-03-16 VITALS — BP 126/90 | HR 79 | Ht 75.0 in | Wt >= 6400 oz

## 2021-03-16 DIAGNOSIS — I350 Nonrheumatic aortic (valve) stenosis: Secondary | ICD-10-CM | POA: Diagnosis not present

## 2021-03-16 MED ORDER — POTASSIUM CHLORIDE CRYS ER 20 MEQ PO TBCR: 20.0000 meq | EXTENDED_RELEASE_TABLET | Freq: Every day | ORAL | 3 refills | Status: DC

## 2021-03-16 MED ORDER — FUROSEMIDE 40 MG PO TABS: 40.0000 mg | ORAL_TABLET | Freq: Every day | ORAL | 3 refills | Status: DC

## 2021-03-16 NOTE — Patient Instructions (Signed)
Medication Instructions:  Your physician has recommended you make the following change in your medication:  1-START furosemide 40 mg by mouth daily. 2-START potassium 20 meq by mouth daily  *If you need a refill on your cardiac medications before your next appointment, please call your pharmacy*   Lab Work: Your physician recommends that you have lab work today- BMET and CBC.  If you have labs (blood work) drawn today and your tests are completely normal, you will receive your results only by: [place] (if you have MyChart) OR A paper copy in the mail If you have any lab test that is abnormal or we need to change your treatment, we will call you to review the results.   Testing/Procedures: Your physician has requested that you have a cardiac catheterization. Cardiac catheterization is used to diagnose and/or treat various heart conditions. Doctors may recommend this procedure for a number of different reasons. The most common reason is to evaluate chest pain. Chest pain can be a symptom of coronary artery disease (CAD), and cardiac catheterization can show whether plaque is narrowing or blocking your heart's arteries. This procedure is also used to evaluate the valves, as well as measure the blood flow and oxygen levels in different parts of your heart. For further information please visit [email or web address]. Please follow instruction sheet, as given.   Follow-Up: At [facility], you and your health needs are our priority.  As part of our continuing mission to provide you with exceptional heart care, we have created designated Provider Care Teams.  These Care Teams include your primary Cardiologist (physician) and Advanced Practice Providers (APPs -  Physician Assistants and Nurse Practitioners) who all work together to provide you with the care you need, when you need it.  We recommend signing up for the patient portal called "MyChart".  Sign up information is provided on this  After Visit Summary.  MyChart is used to connect with patients for Virtual Visits (Telemedicine).  Patients are able to view lab/test results, encounter notes, upcoming appointments, etc.  Non-urgent messages can be sent to your provider as well.   To learn more about what you can do with MyChart, go to [email or web address].    Your next appointment:   3 week(s)  The format for your next appointment:   In Person  Provider:   Lauree Chandler, MD, Kathyrn Drown, NP, or Nell Range, PA-C   Other Instructions   [place] OFFICE [place], [place] [address] Dept: [phone] Loc: [phone]  David [name]  03/16/2021  You are scheduled for a Cardiac Catheterization on Thursday, August 4 with Dr. Lauree Chandler.  1. Please arrive at the [facility] (Main Entrance A) at [facility]: [address] [place], [place] [address] at 10:00 AM (This time is two hours before your procedure to ensure your preparation). Free valet parking service is available.   Special note: Every effort is made to have your procedure done on time. Please understand that emergencies sometimes delay scheduled procedures.  2. Diet: Do not eat solid foods after midnight.  The patient may have clear liquids until 5am upon the day of the procedure.  3. Labs: You will need to have blood drawn on Thursday, July 28 at [facility] at [facility]. [address]  Open: 7:30am - 5pm    Phone: [phone]. You do not need to be fasting.  4.  Medication instructions in preparation for your procedure:   Contrast Allergy: No  Stop taking Xarelto (Rivaroxaban) on Tuesday, August 2.  Stop taking, Diovan/HCTZ Thursday, August 4,, Lasix (Furosemide)  Thursday, August 4,  On the morning of your procedure, take your Aspirin and any morning medicines NOT listed above.  You may  use sips of water.  5. Plan for one night stay--bring personal belongings. 6. Bring a current list of your medications and current insurance cards. 7. You MUST have a responsible person to drive you home. 8. Someone MUST be with you the first 24 hours after you arrive home or your discharge will be delayed. 9. Please wear clothes that are easy to get on and off and wear slip-on shoes.  Thank you for allowing [place] to care for you!   -- [place] [facility]

## 2021-03-16 NOTE — H&P (View-Only) (Signed)
[facility] Consult Note  Chief Complaint  Patient presents with   New Patient (Initial Visit)    Severe aortic stenosis   History of Present Illness: 65 yo male with history of HTN, hyperlipidemia, morbid obesity, paroxysmal atrial fibrillation, sleep apnea, former tobacco abuse and severe aortic stenosis who is here today as a new consult, referred by Dr. Debara Pickett, for further discussion regarding his aortic stenosis and AVR vs TAVR. He has paroxysmal atrial fibrillation and is on Xarelto. He has been followed for moderate aortic stenosis by Dr. Debara Pickett. Echo 03/03/21 with LVEF=60-65%, moderate LVH. The aortic valve leaflets are thickened and calcified with limited leaflet mobility. There is severe aortic valve stenosis with mean gradient 49 mmHg, peak gradient 82 mmHg, AVA 0.67 cm2, dimensionless index 0.18. He is not known to have CAD.   He tells me today that he has progressive dyspnea over the past year. Rare chest pains. No dizziness or near syncope. He has had lower extremity edema for the past year. He has not taken diuretics. He lives in [place]. He is single. He has no children. He is the Educational psychologist at [name]. He has poor dentition and has not seen a dentist in years.   Primary Care Physician: Denita Lung, MD Primary Cardiologist: [facility] Referring Cardiologist: Debara Pickett  Past Medical History:  Diagnosis Date   Allergy    RHINITIS   Arthritis    thumb   Complication of anesthesia    2012 shoulder surgery: patient unsure of what the problem was    GERD (gastroesophageal reflux disease)    "not really"   History of echocardiogram 03/29/2009   EF 50-55%; mild concentric LVH;    History of nuclear stress test 08/28/2007   moderate diaphragmatic attenuation; EKG neg for ischemia    Hx of adenomatous colonic polyps    Hyperlipidemia    Hypertension    takes Diovan and Toprol   Obesity    PAF (paroxysmal atrial fibrillation) (HCC)    associated with  OSA   Right bundle branch block    Rosacea    Shortness of breath    With activity   Sleep apnea    CPAP   Smoker    Systolic murmur     Past Surgical History:  Procedure Laterality Date   COLONOSCOPY WITH PROPOFOL N/A 11/25/2020   Procedure: COLONOSCOPY WITH PROPOFOL;  Surgeon: Carol Ada, MD;  Location: [facility];  Service: Endoscopy;  Laterality: N/A;   FINGER SURGERY     Reconstruction of Left thumb and L index finger.  Reconstruction    HEMOSTASIS CLIP PLACEMENT  11/25/2020   Procedure: HEMOSTASIS CLIP PLACEMENT;  Surgeon: Carol Ada, MD;  Location: [facility];  Service: Endoscopy;;   KNEE ARTHROSCOPY     POLYPECTOMY  11/25/2020   Procedure: POLYPECTOMY;  Surgeon: Carol Ada, MD;  Location: [facility];  Service: Endoscopy;;   SHOULDER CLOSED REDUCTION     Procedure: CLOSED REDUCTION SHOULDER;  Surgeon: Lawrence Santiago [facility];  Location: [place];  Service: Orthopedics;  Laterality: Right;   SHOULDER OPEN ROTATOR CUFF REPAIR  08/11/2011   Procedure: ROTATOR CUFF REPAIR SHOULDER OPEN;  Surgeon: Willa Frater III;  Location: [place];  Service: Orthopedics;  Laterality: Right;   SUBMUCOSAL LIFTING INJECTION  11/25/2020   Procedure: SUBMUCOSAL LIFTING INJECTION;  Surgeon: Carol Ada, MD;  Location: [facility];  Service: Endoscopy;;   SUBMUCOSAL TATTOO INJECTION  11/25/2020   Procedure: SUBMUCOSAL TATTOO INJECTION;  Surgeon: Carol Ada, MD;  Location: [facility];  Service: Endoscopy;;   TONSILLECTOMY     as a child    Current Outpatient Medications  Medication Sig Dispense Refill   ALPRAZolam (XANAX) 1 MG tablet Take 1 tablet (1 mg total) by mouth at bedtime as needed for sleep. 30 tablet 0   atorvastatin (LIPITOR) 10 MG tablet TAKE 1 TABLET BY MOUTH EVERY DAY AT 6 PM 90 tablet 3   cephALEXin (KEFLEX) 500 MG capsule Take 1 capsule (500 mg total) by mouth 4 (four) times daily. 90 capsule 0   diltiazem (CARDIZEM CD) 120 MG 24 hr capsule Take 1 capsule (120 mg total)  by mouth daily. 90 capsule 3   furosemide (LASIX) 40 MG tablet Take 1 tablet (40 mg total) by mouth daily. 90 tablet 3   HYDROcodone-acetaminophen (NORCO/VICODIN) 5-325 MG tablet Take 1 tablet by mouth every 6 (six) hours as needed for moderate pain. 30 tablet 0   hydrocortisone 2.5 % cream Apply to external ears as needed for itching. 30 g 5   methocarbamol (ROBAXIN) 500 MG tablet Take 1 tablet (500 mg total) by mouth 4 (four) times daily as needed for muscle spasms. 30 tablet 0   metoprolol succinate (TOPROL-XL) 50 MG 24 hr tablet Take with or immediately following a meal. 90 tablet 3   potassium chloride SA (KLOR-CON) 20 MEQ tablet Take 1 tablet (20 mEq total) by mouth daily. 90 tablet 3   rivaroxaban (XARELTO) 20 MG TABS tablet TAKE 1 TABLET(20 MG) BY MOUTH DAILY WITH SUPPER 90 tablet 3   valsartan-hydrochlorothiazide (DIOVAN-HCT) 160-25 MG tablet Take 0.5 tablets by mouth daily. Take half tab daily for the next 3 days and if blood pressure sustains above 120 can start taking 1 tablet daily after that     No current facility-administered medications for this visit.    Allergies  Allergen Reactions   Codeine Shortness Of Breath and Swelling   Morphine And Related Shortness Of Breath and Swelling    Social History   Socioeconomic History   Marital status: Single    Spouse name: Not on file   Number of children: Not on file   Years of education: Not on file   Highest education level: Not on file  Occupational History   Not on file  Tobacco Use   Smoking status: Former    Packs/day: 1.00    Years: 40.00    Pack years: 40.00    Types: Cigarettes    Quit date: 06/20/2016    Years since quitting: 4.7   Smokeless tobacco: Never  Vaping Use   Vaping Use: Never used  Substance and Sexual Activity   Alcohol use: Yes    Alcohol/week: 11.0 standard drinks    Types: 6 Cans of beer, 5 Shots of liquor per week   Drug use: No   Sexual activity: Not Currently  Other Topics Concern    Not on file  Social History Narrative   Not on file   Social Determinants of Health   Financial Resource Strain: Not on file  Food Insecurity: Not on file  Transportation Needs: Not on file  Physical Activity: Not on file  Stress: Not on file  Social Connections: Not on file  Intimate Partner Violence: Not on file    Family History  Problem Relation Age of Onset   Heart disease Mother    Cancer - Prostate Maternal Grandfather    Cancer - Prostate Paternal Grandfather    Cancer - Colon Father  Heart attack Father 11   Cancer Paternal Uncle    Anesthesia problems Neg Hx    Stroke Neg Hx     Review of Systems:  As stated in the HPI and otherwise negative.   BP 126/90   Pulse 79   Ht '6\' 3"'$  (1.905 m)   Wt (!) 435 lb 6.4 oz (197.5 kg)   SpO2 98%   BMI 54.42 kg/m   Physical Examination: General: Well developed, well nourished, NAD  HEENT: OP clear, mucus membranes moist  SKIN: warm, dry. No rashes. Neuro: No focal deficits  Musculoskeletal: Muscle strength 5/5 all ext  Psychiatric: Mood and affect normal  Neck: No JVD, no carotid bruits, no thyromegaly, no lymphadenopathy.  Lungs:Clear bilaterally, no wheezes, rhonci, crackles Cardiovascular: Regular rate and rhythm. Loud, harsh, late peaking systolic murmur.  Abdomen:Soft. Bowel sounds present. Non-tender.  Extremities: 1-2+ bilateral lower extremity edema.   EKG:  EKG is ordered today. The ekg ordered today demonstrates Atrial fib, RBBB  Echo 03/02/21:   1. Left ventricular ejection fraction, by estimation, is 60 to 65%. The  left ventricle has normal function. The left ventricle has no regional  wall motion abnormalities. There is moderate concentric left ventricular  hypertrophy. Left ventricular  diastolic function could not be evaluated. Elevated left ventricular  end-diastolic pressure.   2. Right ventricular systolic function is normal. The right ventricular  size is normal.   3. The mitral valve is  normal in structure. No evidence of mitral valve  regurgitation. No evidence of mitral stenosis.   4. The aortic valve is tricuspid. There is severe calcifcation of the  aortic valve. There is severe thickening of the aortic valve. Aortic valve  regurgitation is not visualized. Severe aortic valve stenosis. Aortic  valve area, by VTI measures 0.67 cm.  Aortic valve mean gradient measures 49.0 mmHg. Aortic valve Vmax measures  4.53 m/s.   5. Aortic dilatation noted. There is borderline dilatation of the  ascending aorta, measuring 38 mm.   6. The inferior vena cava is normal in size with greater than 50%  respiratory variability, suggesting right atrial pressure of 3 mmHg.   FINDINGS   Left Ventricle: Left ventricular ejection fraction, by estimation, is 60  to 65%. The left ventricle has normal function. The left ventricle has no  regional wall motion abnormalities. The left ventricular internal cavity  size was normal in size. There is   moderate concentric left ventricular hypertrophy. Left ventricular  diastolic function could not be evaluated due to atrial fibrillation. Left  ventricular diastolic function could not be evaluated. Elevated left  ventricular end-diastolic pressure.   Right Ventricle: The right ventricular size is normal. No increase in  right ventricular wall thickness. Right ventricular systolic function is  normal.   Left Atrium: Left atrial size was normal in size.   Right Atrium: Right atrial size was normal in size.   Pericardium: There is no evidence of pericardial effusion.   Mitral Valve: The mitral valve is normal in structure. Mild mitral annular  calcification. No evidence of mitral valve regurgitation. No evidence of  mitral valve stenosis.   Tricuspid Valve: The tricuspid valve is normal in structure. Tricuspid  valve regurgitation is not demonstrated. No evidence of tricuspid  stenosis.   Aortic Valve: The aortic valve is tricuspid. There is  severe calcifcation  of the aortic valve. There is severe thickening of the aortic valve.  Aortic valve regurgitation is not visualized. Severe aortic stenosis is  present.  Aortic valve mean gradient  measures 49.0 mmHg. Aortic valve peak gradient measures 82.1 mmHg. Aortic  valve area, by VTI measures 0.67 cm.   Pulmonic Valve: The pulmonic valve was normal in structure. Pulmonic valve  regurgitation is not visualized. No evidence of pulmonic stenosis.   Aorta: Aortic dilatation noted. There is borderline dilatation of the  ascending aorta, measuring 38 mm.   Venous: The inferior vena cava is normal in size with greater than 50%  respiratory variability, suggesting right atrial pressure of 3 mmHg.   IAS/Shunts: No atrial level shunt detected by color flow Doppler.      LEFT VENTRICLE  PLAX 2D  LVIDd:         4.90 cm  Diastology  LVIDs:         3.50 cm  LV e' medial:    10.60 cm/s  LV PW:         1.55 cm  LV E/e' medial:  13.8  LV IVS:        1.45 cm  LV e' lateral:   11.70 cm/s  LVOT diam:     2.20 cm  LV E/e' lateral: 12.5  LV SV:         77  LV SV Index:   25  LVOT Area:     3.80 cm      RIGHT VENTRICLE  RV Basal diam:  3.80 cm  RV S prime:     13.70 cm/s  TAPSE (M-mode): 1.6 cm   LEFT ATRIUM              Index       RIGHT ATRIUM           Index  LA diam:        4.80 cm  1.56 cm/m  RA Area:     25.50 cm  LA Vol (A2C):   116.0 ml 37.72 ml/m RA Volume:   86.30 ml  28.06 ml/m  LA Vol (A4C):   148.0 ml 48.13 ml/m  LA Biplane Vol: 133.0 ml 43.25 ml/m   AORTIC VALVE  AV Area (Vmax):    0.70 cm  AV Area (Vmean):   0.71 cm  AV Area (VTI):     0.67 cm  AV Vmax:           453.00 cm/s  AV Vmean:          331.000 cm/s  AV VTI:            1.160 m  AV Peak Grad:      82.1 mmHg  AV Mean Grad:      49.0 mmHg  LVOT Vmax:         82.90 cm/s  LVOT Vmean:        62.100 cm/s  LVOT VTI:          0.203 m  LVOT/AV VTI ratio: 0.18     AORTA  Ao Root diam: 3.60 cm  Ao Asc  diam:  3.80 cm   MITRAL VALVE  MV Area (PHT):              SHUNTS  MV Decel Time:              Systemic VTI:  0.20 m  MV E velocity: 146.00 cm/s  Systemic Diam: 2.20 cm   Recent Labs: 05/05/2020: B Natriuretic Peptide 157.0 05/20/2020: TSH 1.982 05/21/2020: Magnesium 1.8 09/15/2020: ALT 28; BUN 17; Creatinine, Ser 0.96; Hemoglobin 14.3; Platelets 199; Potassium 4.9;  Sodium 142    Wt Readings from Last 3 Encounters:  03/16/21 (!) 435 lb 6.4 oz (197.5 kg)  03/10/21 (!) 414 lb 12.8 oz (188.2 kg)  03/06/21 (!) 432 lb 12.8 oz (196.3 kg)     Other studies Reviewed: Additional studies/ records that were reviewed today include:echo images, office notes, EKG Review of the above records demonstrates: severe AS   STS Risk Score: Procedure: AVR + CAB Risk of Mortality: 4.075% Renal Failure: 5.434% Permanent Stroke: 0.649% Prolonged Ventilation: 14.692% DSW Infection: 0.571% Reoperation: 5.331% Morbidity or Mortality: 20.801% Short Length of Stay: 22.340% Long Length of Stay: 9.925%  Assessment and Plan:   1. Severe Aortic Valve Stenosis: He has severe, stage D aortic valve stenosis. I have personally reviewed the echo images. The aortic valve is thickened, calcified with limited leaflet mobility. I think he would benefit from AVR. He may be a candidate for surgical AVR but given his obesity, I think he would be better served with TAVR.     I have reviewed the natural history of aortic stenosis with the patient and their family members  who are present today. We have discussed the limitations of medical therapy and the poor prognosis associated with symptomatic aortic stenosis. We have reviewed potential treatment options, including palliative medical therapy, conventional surgical aortic valve replacement, and transcatheter aortic valve replacement. We discussed treatment options in the context of the patient's specific comorbid medical conditions.   He would like to proceed with  planning for TAVR. I will arrange a right and left heart catheterization at [facility] 03/23/21 at noon. Risks and benefits of the cath procedure and the valve procedure are reviewed with the patient. After the cath, he will have a cardiac CT, CTA of the chest/abdomen and pelvis, carotid artery dopplers, PT assessment and will then be referred to see Dr. Cyndia Bent in the CT surgery office.    I will start Lasix 40 mg daily and KDur 20 meq daily.  He will need a dental consult.      Current medicines are reviewed at length with the patient today.  The patient does not have concerns regarding medicines.  The following changes have been made:  no change  Labs/ tests ordered today include:   Orders Placed This Encounter  Procedures   Basic metabolic panel   CBC with Differential/Platelet   EKG 12-Lead      Disposition:   F/U with the valve team.     Signed, Lauree Chandler, MD 03/16/2021 2:56 PM    [facility], [place], [place]  [address] Phone: [phone]; Fax: [phone]

## 2021-03-16 NOTE — Progress Notes (Signed)
[facility] Consult Note  Chief Complaint  Patient presents with   New Patient (Initial Visit)    Severe aortic stenosis   History of Present Illness: 65 yo male with history of HTN, hyperlipidemia, morbid obesity, paroxysmal atrial fibrillation, sleep apnea, former tobacco abuse and severe aortic stenosis who is here today as a new consult, referred by Dr. Debara Pickett, for further discussion regarding his aortic stenosis and AVR vs TAVR. He has paroxysmal atrial fibrillation and is on Xarelto. He has been followed for moderate aortic stenosis by Dr. Debara Pickett. Echo 03/03/21 with LVEF=60-65%, moderate LVH. The aortic valve leaflets are thickened and calcified with limited leaflet mobility. There is severe aortic valve stenosis with mean gradient 49 mmHg, peak gradient 82 mmHg, AVA 0.67 cm2, dimensionless index 0.18. He is not known to have CAD.   He tells me today that he has progressive dyspnea over the past year. Rare chest pains. No dizziness or near syncope. He has had lower extremity edema for the past year. He has not taken diuretics. He lives in [place]. He is single. He has no children. He is the Educational psychologist at [name]. He has poor dentition and has not seen a dentist in years.   Primary Care Physician: Denita Lung, MD Primary Cardiologist: [facility] Referring Cardiologist: Debara Pickett  Past Medical History:  Diagnosis Date   Allergy    RHINITIS   Arthritis    thumb   Complication of anesthesia    2012 shoulder surgery: patient unsure of what the problem was    GERD (gastroesophageal reflux disease)    "not really"   History of echocardiogram 03/29/2009   EF 50-55%; mild concentric LVH;    History of nuclear stress test 08/28/2007   moderate diaphragmatic attenuation; EKG neg for ischemia    Hx of adenomatous colonic polyps    Hyperlipidemia    Hypertension    takes Diovan and Toprol   Obesity    PAF (paroxysmal atrial fibrillation) (HCC)    associated with  OSA   Right bundle branch block    Rosacea    Shortness of breath    With activity   Sleep apnea    CPAP   Smoker    Systolic murmur     Past Surgical History:  Procedure Laterality Date   COLONOSCOPY WITH PROPOFOL N/A 11/25/2020   Procedure: COLONOSCOPY WITH PROPOFOL;  Surgeon: Carol Ada, MD;  Location: [facility];  Service: Endoscopy;  Laterality: N/A;   FINGER SURGERY     Reconstruction of Left thumb and L index finger.  Reconstruction    HEMOSTASIS CLIP PLACEMENT  11/25/2020   Procedure: HEMOSTASIS CLIP PLACEMENT;  Surgeon: Carol Ada, MD;  Location: [facility];  Service: Endoscopy;;   KNEE ARTHROSCOPY     POLYPECTOMY  11/25/2020   Procedure: POLYPECTOMY;  Surgeon: Carol Ada, MD;  Location: [facility];  Service: Endoscopy;;   SHOULDER CLOSED REDUCTION     Procedure: CLOSED REDUCTION SHOULDER;  Surgeon: Lawrence [facility];  Location: [place];  Service: Orthopedics;  Laterality: Right;   SHOULDER OPEN ROTATOR CUFF REPAIR  08/11/2011   Procedure: ROTATOR CUFF REPAIR SHOULDER OPEN;  Surgeon: Willa Frater III;  Location: [place];  Service: Orthopedics;  Laterality: Right;   SUBMUCOSAL LIFTING INJECTION  11/25/2020   Procedure: SUBMUCOSAL LIFTING INJECTION;  Surgeon: Carol Ada, MD;  Location: [facility];  Service: Endoscopy;;   SUBMUCOSAL TATTOO INJECTION  11/25/2020   Procedure: SUBMUCOSAL TATTOO INJECTION;  Surgeon: Carol Ada, MD;  Location: [facility];  Service: Endoscopy;;   TONSILLECTOMY     as a child    Current Outpatient Medications  Medication Sig Dispense Refill   ALPRAZolam (XANAX) 1 MG tablet Take 1 tablet (1 mg total) by mouth at bedtime as needed for sleep. 30 tablet 0   atorvastatin (LIPITOR) 10 MG tablet TAKE 1 TABLET BY MOUTH EVERY DAY AT 6 PM 90 tablet 3   cephALEXin (KEFLEX) 500 MG capsule Take 1 capsule (500 mg total) by mouth 4 (four) times daily. 90 capsule 0   diltiazem (CARDIZEM CD) 120 MG 24 hr capsule Take 1 capsule (120 mg total)  by mouth daily. 90 capsule 3   furosemide (LASIX) 40 MG tablet Take 1 tablet (40 mg total) by mouth daily. 90 tablet 3   HYDROcodone-acetaminophen (NORCO/VICODIN) 5-325 MG tablet Take 1 tablet by mouth every 6 (six) hours as needed for moderate pain. 30 tablet 0   hydrocortisone 2.5 % cream Apply to external ears as needed for itching. 30 g 5   methocarbamol (ROBAXIN) 500 MG tablet Take 1 tablet (500 mg total) by mouth 4 (four) times daily as needed for muscle spasms. 30 tablet 0   metoprolol succinate (TOPROL-XL) 50 MG 24 hr tablet Take with or immediately following a meal. 90 tablet 3   potassium chloride SA (KLOR-CON) 20 MEQ tablet Take 1 tablet (20 mEq total) by mouth daily. 90 tablet 3   rivaroxaban (XARELTO) 20 MG TABS tablet TAKE 1 TABLET(20 MG) BY MOUTH DAILY WITH SUPPER 90 tablet 3   valsartan-hydrochlorothiazide (DIOVAN-HCT) 160-25 MG tablet Take 0.5 tablets by mouth daily. Take half tab daily for the next 3 days and if blood pressure sustains above 120 can start taking 1 tablet daily after that     No current facility-administered medications for this visit.    Allergies  Allergen Reactions   Codeine Shortness Of Breath and Swelling   Morphine And Related Shortness Of Breath and Swelling    Social History   Socioeconomic History   Marital status: Single    Spouse name: Not on file   Number of children: Not on file   Years of education: Not on file   Highest education level: Not on file  Occupational History   Not on file  Tobacco Use   Smoking status: Former    Packs/day: 1.00    Years: 40.00    Pack years: 40.00    Types: Cigarettes    Quit date: 06/20/2016    Years since quitting: 4.7   Smokeless tobacco: Never  Vaping Use   Vaping Use: Never used  Substance and Sexual Activity   Alcohol use: Yes    Alcohol/week: 11.0 standard drinks    Types: 6 Cans of beer, 5 Shots of liquor per week   Drug use: No   Sexual activity: Not Currently  Other Topics Concern    Not on file  Social History Narrative   Not on file   Social Determinants of Health   Financial Resource Strain: Not on file  Food Insecurity: Not on file  Transportation Needs: Not on file  Physical Activity: Not on file  Stress: Not on file  Social Connections: Not on file  Intimate Partner Violence: Not on file    Family History  Problem Relation Age of Onset   Heart disease Mother    Cancer - Prostate Maternal Grandfather    Cancer - Prostate Paternal Grandfather    Cancer - Colon Father  Heart attack Father 69   Cancer Paternal Uncle    Anesthesia problems Neg Hx    Stroke Neg Hx     Review of Systems:  As stated in the HPI and otherwise negative.   BP 126/90   Pulse 79   Ht '6\' 3"'$  (1.905 m)   Wt (!) 435 lb 6.4 oz (197.5 kg)   SpO2 98%   BMI 54.42 kg/m   Physical Examination: General: Well developed, well nourished, NAD  HEENT: OP clear, mucus membranes moist  SKIN: warm, dry. No rashes. Neuro: No focal deficits  Musculoskeletal: Muscle strength 5/5 all ext  Psychiatric: Mood and affect normal  Neck: No JVD, no carotid bruits, no thyromegaly, no lymphadenopathy.  Lungs:Clear bilaterally, no wheezes, rhonci, crackles Cardiovascular: Regular rate and rhythm. Loud, harsh, late peaking systolic murmur.  Abdomen:Soft. Bowel sounds present. Non-tender.  Extremities: 1-2+ bilateral lower extremity edema.   EKG:  EKG is ordered today. The ekg ordered today demonstrates Atrial fib, RBBB  Echo 03/02/21:   1. Left ventricular ejection fraction, by estimation, is 60 to 65%. The  left ventricle has normal function. The left ventricle has no regional  wall motion abnormalities. There is moderate concentric left ventricular  hypertrophy. Left ventricular  diastolic function could not be evaluated. Elevated left ventricular  end-diastolic pressure.   2. Right ventricular systolic function is normal. The right ventricular  size is normal.   3. The mitral valve is  normal in structure. No evidence of mitral valve  regurgitation. No evidence of mitral stenosis.   4. The aortic valve is tricuspid. There is severe calcifcation of the  aortic valve. There is severe thickening of the aortic valve. Aortic valve  regurgitation is not visualized. Severe aortic valve stenosis. Aortic  valve area, by VTI measures 0.67 cm.  Aortic valve mean gradient measures 49.0 mmHg. Aortic valve Vmax measures  4.53 m/s.   5. Aortic dilatation noted. There is borderline dilatation of the  ascending aorta, measuring 38 mm.   6. The inferior vena cava is normal in size with greater than 50%  respiratory variability, suggesting right atrial pressure of 3 mmHg.   FINDINGS   Left Ventricle: Left ventricular ejection fraction, by estimation, is 60  to 65%. The left ventricle has normal function. The left ventricle has no  regional wall motion abnormalities. The left ventricular internal cavity  size was normal in size. There is   moderate concentric left ventricular hypertrophy. Left ventricular  diastolic function could not be evaluated due to atrial fibrillation. Left  ventricular diastolic function could not be evaluated. Elevated left  ventricular end-diastolic pressure.   Right Ventricle: The right ventricular size is normal. No increase in  right ventricular wall thickness. Right ventricular systolic function is  normal.   Left Atrium: Left atrial size was normal in size.   Right Atrium: Right atrial size was normal in size.   Pericardium: There is no evidence of pericardial effusion.   Mitral Valve: The mitral valve is normal in structure. Mild mitral annular  calcification. No evidence of mitral valve regurgitation. No evidence of  mitral valve stenosis.   Tricuspid Valve: The tricuspid valve is normal in structure. Tricuspid  valve regurgitation is not demonstrated. No evidence of tricuspid  stenosis.   Aortic Valve: The aortic valve is tricuspid. There is  severe calcifcation  of the aortic valve. There is severe thickening of the aortic valve.  Aortic valve regurgitation is not visualized. Severe aortic stenosis is  present.  Aortic valve mean gradient  measures 49.0 mmHg. Aortic valve peak gradient measures 82.1 mmHg. Aortic  valve area, by VTI measures 0.67 cm.   Pulmonic Valve: The pulmonic valve was normal in structure. Pulmonic valve  regurgitation is not visualized. No evidence of pulmonic stenosis.   Aorta: Aortic dilatation noted. There is borderline dilatation of the  ascending aorta, measuring 38 mm.   Venous: The inferior vena cava is normal in size with greater than 50%  respiratory variability, suggesting right atrial pressure of 3 mmHg.   IAS/Shunts: No atrial level shunt detected by color flow Doppler.      LEFT VENTRICLE  PLAX 2D  LVIDd:         4.90 cm  Diastology  LVIDs:         3.50 cm  LV e' medial:    10.60 cm/s  LV PW:         1.55 cm  LV E/e' medial:  13.8  LV IVS:        1.45 cm  LV e' lateral:   11.70 cm/s  LVOT diam:     2.20 cm  LV E/e' lateral: 12.5  LV SV:         77  LV SV Index:   25  LVOT Area:     3.80 cm      RIGHT VENTRICLE  RV Basal diam:  3.80 cm  RV S prime:     13.70 cm/s  TAPSE (M-mode): 1.6 cm   LEFT ATRIUM              Index       RIGHT ATRIUM           Index  LA diam:        4.80 cm  1.56 cm/m  RA Area:     25.50 cm  LA Vol (A2C):   116.0 ml 37.72 ml/m RA Volume:   86.30 ml  28.06 ml/m  LA Vol (A4C):   148.0 ml 48.13 ml/m  LA Biplane Vol: 133.0 ml 43.25 ml/m   AORTIC VALVE  AV Area (Vmax):    0.70 cm  AV Area (Vmean):   0.71 cm  AV Area (VTI):     0.67 cm  AV Vmax:           453.00 cm/s  AV Vmean:          331.000 cm/s  AV VTI:            1.160 m  AV Peak Grad:      82.1 mmHg  AV Mean Grad:      49.0 mmHg  LVOT Vmax:         82.90 cm/s  LVOT Vmean:        62.100 cm/s  LVOT VTI:          0.203 m  LVOT/AV VTI ratio: 0.18     AORTA  Ao Root diam: 3.60 cm  Ao Asc  diam:  3.80 cm   MITRAL VALVE  MV Area (PHT):              SHUNTS  MV Decel Time:              Systemic VTI:  0.20 m  MV E velocity: 146.00 cm/s  Systemic Diam: 2.20 cm   Recent Labs: 05/05/2020: B Natriuretic Peptide 157.0 05/20/2020: TSH 1.982 05/21/2020: Magnesium 1.8 09/15/2020: ALT 28; BUN 17; Creatinine, Ser 0.96; Hemoglobin 14.3; Platelets 199; Potassium 4.9;  Sodium 142    Wt Readings from Last 3 Encounters:  03/16/21 (!) 435 lb 6.4 oz (197.5 kg)  03/10/21 (!) 414 lb 12.8 oz (188.2 kg)  03/06/21 (!) 432 lb 12.8 oz (196.3 kg)     Other studies Reviewed: Additional studies/ records that were reviewed today include:echo images, office notes, EKG Review of the above records demonstrates: severe AS   STS Risk Score: Procedure: AVR + CAB Risk of Mortality: 4.075% Renal Failure: 5.434% Permanent Stroke: 0.649% Prolonged Ventilation: 14.692% DSW Infection: 0.571% Reoperation: 5.331% Morbidity or Mortality: 20.801% Short Length of Stay: 22.340% Long Length of Stay: 9.925%  Assessment and Plan:   1. Severe Aortic Valve Stenosis: He has severe, stage D aortic valve stenosis. I have personally reviewed the echo images. The aortic valve is thickened, calcified with limited leaflet mobility. I think he would benefit from AVR. He may be a candidate for surgical AVR but given his obesity, I think he would be better served with TAVR.     I have reviewed the natural history of aortic stenosis with the patient and their family members  who are present today. We have discussed the limitations of medical therapy and the poor prognosis associated with symptomatic aortic stenosis. We have reviewed potential treatment options, including palliative medical therapy, conventional surgical aortic valve replacement, and transcatheter aortic valve replacement. We discussed treatment options in the context of the patient's specific comorbid medical conditions.   He would like to proceed with  planning for TAVR. I will arrange a right and left heart catheterization at [facility] 03/23/21 at noon. Risks and benefits of the cath procedure and the valve procedure are reviewed with the patient. After the cath, he will have a cardiac CT, CTA of the chest/abdomen and pelvis, carotid artery dopplers, PT assessment and will then be referred to see Dr. Cyndia Bent in the CT surgery office.    I will start Lasix 40 mg daily and KDur 20 meq daily.  He will need a dental consult.      Current medicines are reviewed at length with the patient today.  The patient does not have concerns regarding medicines.  The following changes have been made:  no change  Labs/ tests ordered today include:   Orders Placed This Encounter  Procedures   Basic metabolic panel   CBC with Differential/Platelet   EKG 12-Lead      Disposition:   F/U with the valve team.     Signed, Lauree Chandler, MD 03/16/2021 2:56 PM    [address], [place], [place]  [address] Phone: [phone]; Fax: [phone]

## 2021-03-16 NOTE — H&P (View-Only) (Signed)
[facility] Consult Note  Chief Complaint  Patient presents with   New Patient (Initial Visit)    Severe aortic stenosis   History of Present Illness: 65 yo male with history of HTN, hyperlipidemia, morbid obesity, paroxysmal atrial fibrillation, sleep apnea, former tobacco abuse and severe aortic stenosis who is here today as a new consult, referred by Dr. Debara Pickett, for further discussion regarding his aortic stenosis and AVR vs TAVR. He has paroxysmal atrial fibrillation and is on Xarelto. He has been followed for moderate aortic stenosis by Dr. Debara Pickett. Echo 03/03/21 with LVEF=60-65%, moderate LVH. The aortic valve leaflets are thickened and calcified with limited leaflet mobility. There is severe aortic valve stenosis with mean gradient 49 mmHg, peak gradient 82 mmHg, AVA 0.67 cm2, dimensionless index 0.18. He is not known to have CAD.   He tells me today that he has progressive dyspnea over the past year. Rare chest pains. No dizziness or near syncope. He has had lower extremity edema for the past year. He has not taken diuretics. He lives in [place]. He is single. He has no children. He is the Educational psychologist at [name]. He has poor dentition and has not seen a dentist in years.   Primary Care Physician: Denita Lung, MD Primary Cardiologist: [facility] Referring Cardiologist: Debara Pickett  Past Medical History:  Diagnosis Date   Allergy    RHINITIS   Arthritis    thumb   Complication of anesthesia    2012 shoulder surgery: patient unsure of what the problem was    GERD (gastroesophageal reflux disease)    "not really"   History of echocardiogram 03/29/2009   EF 50-55%; mild concentric LVH;    History of nuclear stress test 08/28/2007   moderate diaphragmatic attenuation; EKG neg for ischemia    Hx of adenomatous colonic polyps    Hyperlipidemia    Hypertension    takes Diovan and Toprol   Obesity    PAF (paroxysmal atrial fibrillation) (HCC)    associated with  OSA   Right bundle branch block    Rosacea    Shortness of breath    With activity   Sleep apnea    CPAP   Smoker    Systolic murmur     Past Surgical History:  Procedure Laterality Date   COLONOSCOPY WITH PROPOFOL N/A 11/25/2020   Procedure: COLONOSCOPY WITH PROPOFOL;  Surgeon: Carol Ada, MD;  Location: [facility];  Service: Endoscopy;  Laterality: N/A;   FINGER SURGERY     Reconstruction of Left thumb and L index finger.  Reconstruction    HEMOSTASIS CLIP PLACEMENT  11/25/2020   Procedure: HEMOSTASIS CLIP PLACEMENT;  Surgeon: Carol Ada, MD;  Location: [facility];  Service: Endoscopy;;   KNEE ARTHROSCOPY     POLYPECTOMY  11/25/2020   Procedure: POLYPECTOMY;  Surgeon: Carol Ada, MD;  Location: [facility];  Service: Endoscopy;;   SHOULDER CLOSED REDUCTION     Procedure: CLOSED REDUCTION SHOULDER;  Surgeon: Lawrence Santiago [facility];  Location: [place];  Service: Orthopedics;  Laterality: Right;   SHOULDER OPEN ROTATOR CUFF REPAIR  08/11/2011   Procedure: ROTATOR CUFF REPAIR SHOULDER OPEN;  Surgeon: Willa Frater III;  Location: [place];  Service: Orthopedics;  Laterality: Right;   SUBMUCOSAL LIFTING INJECTION  11/25/2020   Procedure: SUBMUCOSAL LIFTING INJECTION;  Surgeon: Carol Ada, MD;  Location: [facility];  Service: Endoscopy;;   SUBMUCOSAL TATTOO INJECTION  11/25/2020   Procedure: SUBMUCOSAL TATTOO INJECTION;  Surgeon: Carol Ada, MD;  Location: [facility];  Service: Endoscopy;;   TONSILLECTOMY     as a child    Current Outpatient Medications  Medication Sig Dispense Refill   ALPRAZolam (XANAX) 1 MG tablet Take 1 tablet (1 mg total) by mouth at bedtime as needed for sleep. 30 tablet 0   atorvastatin (LIPITOR) 10 MG tablet TAKE 1 TABLET BY MOUTH EVERY DAY AT 6 PM 90 tablet 3   cephALEXin (KEFLEX) 500 MG capsule Take 1 capsule (500 mg total) by mouth 4 (four) times daily. 90 capsule 0   diltiazem (CARDIZEM CD) 120 MG 24 hr capsule Take 1 capsule (120 mg total)  by mouth daily. 90 capsule 3   furosemide (LASIX) 40 MG tablet Take 1 tablet (40 mg total) by mouth daily. 90 tablet 3   HYDROcodone-acetaminophen (NORCO/VICODIN) 5-325 MG tablet Take 1 tablet by mouth every 6 (six) hours as needed for moderate pain. 30 tablet 0   hydrocortisone 2.5 % cream Apply to external ears as needed for itching. 30 g 5   methocarbamol (ROBAXIN) 500 MG tablet Take 1 tablet (500 mg total) by mouth 4 (four) times daily as needed for muscle spasms. 30 tablet 0   metoprolol succinate (TOPROL-XL) 50 MG 24 hr tablet Take with or immediately following a meal. 90 tablet 3   potassium chloride SA (KLOR-CON) 20 MEQ tablet Take 1 tablet (20 mEq total) by mouth daily. 90 tablet 3   rivaroxaban (XARELTO) 20 MG TABS tablet TAKE 1 TABLET(20 MG) BY MOUTH DAILY WITH SUPPER 90 tablet 3   valsartan-hydrochlorothiazide (DIOVAN-HCT) 160-25 MG tablet Take 0.5 tablets by mouth daily. Take half tab daily for the next 3 days and if blood pressure sustains above 120 can start taking 1 tablet daily after that     No current facility-administered medications for this visit.    Allergies  Allergen Reactions   Codeine Shortness Of Breath and Swelling   Morphine And Related Shortness Of Breath and Swelling    Social History   Socioeconomic History   Marital status: Single    Spouse name: Not on file   Number of children: Not on file   Years of education: Not on file   Highest education level: Not on file  Occupational History   Not on file  Tobacco Use   Smoking status: Former    Packs/day: 1.00    Years: 40.00    Pack years: 40.00    Types: Cigarettes    Quit date: 06/20/2016    Years since quitting: 4.7   Smokeless tobacco: Never  Vaping Use   Vaping Use: Never used  Substance and Sexual Activity   Alcohol use: Yes    Alcohol/week: 11.0 standard drinks    Types: 6 Cans of beer, 5 Shots of liquor per week   Drug use: No   Sexual activity: Not Currently  Other Topics Concern    Not on file  Social History Narrative   Not on file   Social Determinants of Health   Financial Resource Strain: Not on file  Food Insecurity: Not on file  Transportation Needs: Not on file  Physical Activity: Not on file  Stress: Not on file  Social Connections: Not on file  Intimate Partner Violence: Not on file    Family History  Problem Relation Age of Onset   Heart disease Mother    Cancer - Prostate Maternal Grandfather    Cancer - Prostate Paternal Grandfather    Cancer - Colon Father  Heart attack Father 73   Cancer Paternal Uncle    Anesthesia problems Neg Hx    Stroke Neg Hx     Review of Systems:  As stated in the HPI and otherwise negative.   BP 126/90   Pulse 79   Ht '6\' 3"'$  (1.905 m)   Wt (!) 435 lb 6.4 oz (197.5 kg)   SpO2 98%   BMI 54.42 kg/m   Physical Examination: General: Well developed, well nourished, NAD  HEENT: OP clear, mucus membranes moist  SKIN: warm, dry. No rashes. Neuro: No focal deficits  Musculoskeletal: Muscle strength 5/5 all ext  Psychiatric: Mood and affect normal  Neck: No JVD, no carotid bruits, no thyromegaly, no lymphadenopathy.  Lungs:Clear bilaterally, no wheezes, rhonci, crackles Cardiovascular: Regular rate and rhythm. Loud, harsh, late peaking systolic murmur.  Abdomen:Soft. Bowel sounds present. Non-tender.  Extremities: 1-2+ bilateral lower extremity edema.   EKG:  EKG is ordered today. The ekg ordered today demonstrates Atrial fib, RBBB  Echo 03/02/21:   1. Left ventricular ejection fraction, by estimation, is 60 to 65%. The  left ventricle has normal function. The left ventricle has no regional  wall motion abnormalities. There is moderate concentric left ventricular  hypertrophy. Left ventricular  diastolic function could not be evaluated. Elevated left ventricular  end-diastolic pressure.   2. Right ventricular systolic function is normal. The right ventricular  size is normal.   3. The mitral valve is  normal in structure. No evidence of mitral valve  regurgitation. No evidence of mitral stenosis.   4. The aortic valve is tricuspid. There is severe calcifcation of the  aortic valve. There is severe thickening of the aortic valve. Aortic valve  regurgitation is not visualized. Severe aortic valve stenosis. Aortic  valve area, by VTI measures 0.67 cm.  Aortic valve mean gradient measures 49.0 mmHg. Aortic valve Vmax measures  4.53 m/s.   5. Aortic dilatation noted. There is borderline dilatation of the  ascending aorta, measuring 38 mm.   6. The inferior vena cava is normal in size with greater than 50%  respiratory variability, suggesting right atrial pressure of 3 mmHg.   FINDINGS   Left Ventricle: Left ventricular ejection fraction, by estimation, is 60  to 65%. The left ventricle has normal function. The left ventricle has no  regional wall motion abnormalities. The left ventricular internal cavity  size was normal in size. There is   moderate concentric left ventricular hypertrophy. Left ventricular  diastolic function could not be evaluated due to atrial fibrillation. Left  ventricular diastolic function could not be evaluated. Elevated left  ventricular end-diastolic pressure.   Right Ventricle: The right ventricular size is normal. No increase in  right ventricular wall thickness. Right ventricular systolic function is  normal.   Left Atrium: Left atrial size was normal in size.   Right Atrium: Right atrial size was normal in size.   Pericardium: There is no evidence of pericardial effusion.   Mitral Valve: The mitral valve is normal in structure. Mild mitral annular  calcification. No evidence of mitral valve regurgitation. No evidence of  mitral valve stenosis.   Tricuspid Valve: The tricuspid valve is normal in structure. Tricuspid  valve regurgitation is not demonstrated. No evidence of tricuspid  stenosis.   Aortic Valve: The aortic valve is tricuspid. There is  severe calcifcation  of the aortic valve. There is severe thickening of the aortic valve.  Aortic valve regurgitation is not visualized. Severe aortic stenosis is  present.  Aortic valve mean gradient  measures 49.0 mmHg. Aortic valve peak gradient measures 82.1 mmHg. Aortic  valve area, by VTI measures 0.67 cm.   Pulmonic Valve: The pulmonic valve was normal in structure. Pulmonic valve  regurgitation is not visualized. No evidence of pulmonic stenosis.   Aorta: Aortic dilatation noted. There is borderline dilatation of the  ascending aorta, measuring 38 mm.   Venous: The inferior vena cava is normal in size with greater than 50%  respiratory variability, suggesting right atrial pressure of 3 mmHg.   IAS/Shunts: No atrial level shunt detected by color flow Doppler.      LEFT VENTRICLE  PLAX 2D  LVIDd:         4.90 cm  Diastology  LVIDs:         3.50 cm  LV e' medial:    10.60 cm/s  LV PW:         1.55 cm  LV E/e' medial:  13.8  LV IVS:        1.45 cm  LV e' lateral:   11.70 cm/s  LVOT diam:     2.20 cm  LV E/e' lateral: 12.5  LV SV:         77  LV SV Index:   25  LVOT Area:     3.80 cm      RIGHT VENTRICLE  RV Basal diam:  3.80 cm  RV S prime:     13.70 cm/s  TAPSE (M-mode): 1.6 cm   LEFT ATRIUM              Index       RIGHT ATRIUM           Index  LA diam:        4.80 cm  1.56 cm/m  RA Area:     25.50 cm  LA Vol (A2C):   116.0 ml 37.72 ml/m RA Volume:   86.30 ml  28.06 ml/m  LA Vol (A4C):   148.0 ml 48.13 ml/m  LA Biplane Vol: 133.0 ml 43.25 ml/m   AORTIC VALVE  AV Area (Vmax):    0.70 cm  AV Area (Vmean):   0.71 cm  AV Area (VTI):     0.67 cm  AV Vmax:           453.00 cm/s  AV Vmean:          331.000 cm/s  AV VTI:            1.160 m  AV Peak Grad:      82.1 mmHg  AV Mean Grad:      49.0 mmHg  LVOT Vmax:         82.90 cm/s  LVOT Vmean:        62.100 cm/s  LVOT VTI:          0.203 m  LVOT/AV VTI ratio: 0.18     AORTA  Ao Root diam: 3.60 cm  Ao Asc  diam:  3.80 cm   MITRAL VALVE  MV Area (PHT):              SHUNTS  MV Decel Time:              Systemic VTI:  0.20 m  MV E velocity: 146.00 cm/s  Systemic Diam: 2.20 cm   Recent Labs: 05/05/2020: B Natriuretic Peptide 157.0 05/20/2020: TSH 1.982 05/21/2020: Magnesium 1.8 09/15/2020: ALT 28; BUN 17; Creatinine, Ser 0.96; Hemoglobin 14.3; Platelets 199; Potassium 4.9;  Sodium 142    Wt Readings from Last 3 Encounters:  03/16/21 (!) 435 lb 6.4 oz (197.5 kg)  03/10/21 (!) 414 lb 12.8 oz (188.2 kg)  03/06/21 (!) 432 lb 12.8 oz (196.3 kg)     Other studies Reviewed: Additional studies/ records that were reviewed today include:echo images, office notes, EKG Review of the above records demonstrates: severe AS   STS Risk Score: Procedure: AVR + CAB Risk of Mortality: 4.075% Renal Failure: 5.434% Permanent Stroke: 0.649% Prolonged Ventilation: 14.692% DSW Infection: 0.571% Reoperation: 5.331% Morbidity or Mortality: 20.801% Short Length of Stay: 22.340% Long Length of Stay: 9.925%  Assessment and Plan:   1. Severe Aortic Valve Stenosis: He has severe, stage D aortic valve stenosis. I have personally reviewed the echo images. The aortic valve is thickened, calcified with limited leaflet mobility. I think he would benefit from AVR. He may be a candidate for surgical AVR but given his obesity, I think he would be better served with TAVR.     I have reviewed the natural history of aortic stenosis with the patient and their family members  who are present today. We have discussed the limitations of medical therapy and the poor prognosis associated with symptomatic aortic stenosis. We have reviewed potential treatment options, including palliative medical therapy, conventional surgical aortic valve replacement, and transcatheter aortic valve replacement. We discussed treatment options in the context of the patient's specific comorbid medical conditions.   He would like to proceed with  planning for TAVR. I will arrange a right and left heart catheterization at [facility] 03/23/21 at noon. Risks and benefits of the cath procedure and the valve procedure are reviewed with the patient. After the cath, he will have a cardiac CT, CTA of the chest/abdomen and pelvis, carotid artery dopplers, PT assessment and will then be referred to see Dr. Cyndia Bent in the CT surgery office.    I will start Lasix 40 mg daily and KDur 20 meq daily.  He will need a dental consult.      Current medicines are reviewed at length with the patient today.  The patient does not have concerns regarding medicines.  The following changes have been made:  no change  Labs/ tests ordered today include:   Orders Placed This Encounter  Procedures   Basic metabolic panel   CBC with Differential/Platelet   EKG 12-Lead      Disposition:   F/U with the valve team.     Signed, Lauree Chandler, MD 03/16/2021 2:56 PM    [address], [place], [place]  [address] Phone: [phone]; Fax: [phone]

## 2021-03-17 ENCOUNTER — Other Ambulatory Visit: Payer: Self-pay

## 2021-03-17 DIAGNOSIS — I350 Nonrheumatic aortic (valve) stenosis: Secondary | ICD-10-CM

## 2021-03-17 DIAGNOSIS — K089 Disorder of teeth and supporting structures, unspecified: Secondary | ICD-10-CM

## 2021-03-17 LAB — CBC WITH DIFFERENTIAL/PLATELET
Basophils Absolute: 0.1 10*3/uL (ref 0.0–0.2)
Basos: 1 %
EOS (ABSOLUTE): 0.3 10*3/uL (ref 0.0–0.4)
Eos: 4 %
Hematocrit: 44 % (ref 37.5–51.0)
Hemoglobin: 15 g/dL (ref 13.0–17.7)
Immature Grans (Abs): 0.1 10*3/uL (ref 0.0–0.1)
Immature Granulocytes: 1 %
Lymphocytes Absolute: 2.1 10*3/uL (ref 0.7–3.1)
Lymphs: 25 %
MCH: 30.6 pg (ref 26.6–33.0)
MCHC: 34.1 g/dL (ref 31.5–35.7)
MCV: 90 fL (ref 79–97)
Monocytes Absolute: 0.7 10*3/uL (ref 0.1–0.9)
Monocytes: 8 %
Neutrophils Absolute: 5.3 10*3/uL (ref 1.4–7.0)
Neutrophils: 61 %
Platelets: 284 10*3/uL (ref 150–450)
RBC: 4.9 x10E6/uL (ref 4.14–5.80)
RDW: 13.4 % (ref 11.6–15.4)
WBC: 8.5 10*3/uL (ref 3.4–10.8)

## 2021-03-17 LAB — BASIC METABOLIC PANEL
BUN/Creatinine Ratio: 17 (ref 10–24)
BUN: 19 mg/dL (ref 8–27)
CO2: 19 mmol/L — ABNORMAL LOW (ref 20–29)
Calcium: 9.7 mg/dL (ref 8.6–10.2)
Chloride: 100 mmol/L (ref 96–106)
Creatinine, Ser: 1.11 mg/dL (ref 0.76–1.27)
Glucose: 103 mg/dL — ABNORMAL HIGH (ref 65–99)
Potassium: 4.6 mmol/L (ref 3.5–5.2)
Sodium: 137 mmol/L (ref 134–144)
eGFR: 74 mL/min/{1.73_m2} (ref >59)

## 2021-03-21 ENCOUNTER — Telehealth: Payer: Self-pay | Admitting: *Deleted

## 2021-03-21 ENCOUNTER — Other Ambulatory Visit: Payer: Self-pay

## 2021-03-21 DIAGNOSIS — I350 Nonrheumatic aortic (valve) stenosis: Secondary | ICD-10-CM

## 2021-03-21 NOTE — Telephone Encounter (Signed)
Cardiac catheterization scheduled at [facility] for: Thursday March 23, 2021 [facility] Main Entrance [facility]) at: 10 AM   No solid food after midnight prior to cath, clear liquids until 5 AM day of procedure.  Medication instructions: Hold:  Xarelto-none 03/21/21 until post procedure  Lasix/KCl-AM of procedure  Valsartan-HCT-AM of procedure   Except hold medications AM meds can be  taken pre-cath with sips of water including:   aspirin 81 mg   Confirmed patient has responsible adult to drive home post procedure and be with patient first 24 hours after arriving home.  Patients are allowed one visitor in the waiting room during the time they are at the hospital for their procedure. Both patient and visitor must wear a mask once they enter the hospital.   Patient reports does not currently have any symptoms concerning for COVID-19 and no household members with COVID-19 like illness.      Reviewed procedure/mask/visitor instructions with patient.

## 2021-03-23 ENCOUNTER — Ambulatory Visit (HOSPITAL_COMMUNITY)
Admission: RE | Admit: 2021-03-23 | Discharge: 2021-03-23 | Disposition: A | Discharge status: Home / Self Care | Payer: 59 | Attending: Cardiovascular Disease | Admitting: Cardiovascular Disease

## 2021-03-23 ENCOUNTER — Encounter (HOSPITAL_COMMUNITY)
Admission: RE | Disposition: A | Discharge status: Home / Self Care | Payer: Self-pay | Source: Home / Self Care | Attending: Cardiovascular Disease

## 2021-03-23 ENCOUNTER — Other Ambulatory Visit: Payer: Self-pay

## 2021-03-23 ENCOUNTER — Encounter (HOSPITAL_COMMUNITY): Payer: Self-pay | Admitting: Cardiovascular Disease

## 2021-03-23 DIAGNOSIS — Z6841 Body Mass Index (BMI) 40.0 and over, adult: Secondary | ICD-10-CM | POA: Insufficient documentation

## 2021-03-23 DIAGNOSIS — E785 Hyperlipidemia, unspecified: Secondary | ICD-10-CM | POA: Diagnosis not present

## 2021-03-23 DIAGNOSIS — Z8249 Family history of ischemic heart disease and other diseases of the circulatory system: Secondary | ICD-10-CM | POA: Diagnosis not present

## 2021-03-23 DIAGNOSIS — Z87891 Personal history of nicotine dependence: Secondary | ICD-10-CM | POA: Insufficient documentation

## 2021-03-23 DIAGNOSIS — G4733 Obstructive sleep apnea (adult) (pediatric): Secondary | ICD-10-CM | POA: Insufficient documentation

## 2021-03-23 DIAGNOSIS — Z79899 Other long term (current) drug therapy: Secondary | ICD-10-CM | POA: Diagnosis not present

## 2021-03-23 DIAGNOSIS — Z885 Allergy status to narcotic agent status: Secondary | ICD-10-CM | POA: Insufficient documentation

## 2021-03-23 DIAGNOSIS — I10 Essential (primary) hypertension: Secondary | ICD-10-CM | POA: Diagnosis not present

## 2021-03-23 DIAGNOSIS — Z952 Presence of prosthetic heart valve: Secondary | ICD-10-CM

## 2021-03-23 DIAGNOSIS — I480 Paroxysmal atrial fibrillation: Secondary | ICD-10-CM | POA: Diagnosis not present

## 2021-03-23 DIAGNOSIS — I350 Nonrheumatic aortic (valve) stenosis: Secondary | ICD-10-CM | POA: Diagnosis not present

## 2021-03-23 DIAGNOSIS — Z7901 Long term (current) use of anticoagulants: Secondary | ICD-10-CM | POA: Insufficient documentation

## 2021-03-23 DIAGNOSIS — Z006 Encounter for examination for normal comparison and control in clinical research program: Secondary | ICD-10-CM

## 2021-03-23 HISTORY — PX: RIGHT/LEFT HEART CATH AND CORONARY ANGIOGRAPHY: CATH118266

## 2021-03-23 LAB — POCT I-STAT EG7
Acid-Base Excess: 1 mmol/L (ref 0.0–2.0)
Acid-Base Excess: 1 mmol/L (ref 0.0–2.0)
Bicarbonate: 26.5 mmol/L (ref 20.0–28.0)
Bicarbonate: 26.6 mmol/L (ref 20.0–28.0)
Calcium, Ion: 1.28 mmol/L (ref 1.15–1.40)
Calcium, Ion: 1.29 mmol/L (ref 1.15–1.40)
HCT: 44 % (ref 39.0–52.0)
HCT: 44 % (ref 39.0–52.0)
Hemoglobin: 15 g/dL (ref 13.0–17.0)
Hemoglobin: 15 g/dL (ref 13.0–17.0)
O2 Saturation: 74 %
O2 Saturation: 75 %
Potassium: 4.1 mmol/L (ref 3.5–5.1)
Potassium: 4.1 mmol/L (ref 3.5–5.1)
Sodium: 141 mmol/L (ref 135–145)
Sodium: 142 mmol/L (ref 135–145)
TCO2: 28 mmol/L (ref 22–32)
TCO2: 28 mmol/L (ref 22–32)
pCO2, Ven: 46.1 mmHg (ref 44.0–60.0)
pCO2, Ven: 46.5 mmHg (ref 44.0–60.0)
pH, Ven: 7.365 (ref 7.250–7.430)
pH, Ven: 7.37 (ref 7.250–7.430)
pO2, Ven: 41 mmHg (ref 32.0–45.0)
pO2, Ven: 42 mmHg (ref 32.0–45.0)

## 2021-03-23 LAB — POCT I-STAT 7, (LYTES, BLD GAS, ICA,H+H)
Acid-base deficit: 1 mmol/L (ref 0.0–2.0)
Bicarbonate: 24.1 mmol/L (ref 20.0–28.0)
Calcium, Ion: 1.16 mmol/L (ref 1.15–1.40)
HCT: 40 % (ref 39.0–52.0)
Hemoglobin: 13.6 g/dL (ref 13.0–17.0)
O2 Saturation: 99 %
Potassium: 3.8 mmol/L (ref 3.5–5.1)
Sodium: 144 mmol/L (ref 135–145)
TCO2: 25 mmol/L (ref 22–32)
pCO2 arterial: 40.1 mmHg (ref 32.0–48.0)
pH, Arterial: 7.388 (ref 7.350–7.450)
pO2, Arterial: 132 mmHg — ABNORMAL HIGH (ref 83.0–108.0)

## 2021-03-23 SURGERY — RIGHT/LEFT HEART CATH AND CORONARY ANGIOGRAPHY
Anesthesia: LOCAL

## 2021-03-23 MED ORDER — FENTANYL CITRATE (PF) 100 MCG/2ML IJ SOLN
INTRAMUSCULAR | Status: DC | PRN
  Administered 2021-03-23: 25 ug via INTRAVENOUS

## 2021-03-23 MED ORDER — SODIUM CHLORIDE 0.9 % IV SOLN: 250.0000 mL | INTRAVENOUS | Status: DC | PRN

## 2021-03-23 MED ORDER — VERAPAMIL HCL 2.5 MG/ML IV SOLN
INTRAVENOUS | Status: AC
  Filled 2021-03-23: qty 2

## 2021-03-23 MED ORDER — LABETALOL HCL 5 MG/ML IV SOLN: 10.0000 mg | INTRAVENOUS | Status: DC | PRN

## 2021-03-23 MED ORDER — HEPARIN (PORCINE) IN NACL 1000-0.9 UT/500ML-% IV SOLN
INTRAVENOUS | Status: AC
  Filled 2021-03-23: qty 500

## 2021-03-23 MED ORDER — SODIUM CHLORIDE 0.9% FLUSH: 3.0000 mL | INTRAVENOUS | Status: DC | PRN

## 2021-03-23 MED ORDER — SODIUM CHLORIDE 0.9% FLUSH: 3.0000 mL | Freq: Two times a day (BID) | INTRAVENOUS | Status: DC

## 2021-03-23 MED ORDER — HEPARIN SODIUM (PORCINE) 1000 UNIT/ML IJ SOLN
INTRAMUSCULAR | Status: AC
  Filled 2021-03-23: qty 1

## 2021-03-23 MED ORDER — MIDAZOLAM HCL 2 MG/2ML IJ SOLN
INTRAMUSCULAR | Status: DC | PRN
  Administered 2021-03-23: 1 mg via INTRAVENOUS

## 2021-03-23 MED ORDER — FENTANYL CITRATE (PF) 100 MCG/2ML IJ SOLN
INTRAMUSCULAR | Status: AC
  Filled 2021-03-23: qty 2

## 2021-03-23 MED ORDER — HYDRALAZINE HCL 20 MG/ML IJ SOLN: 10.0000 mg | INTRAMUSCULAR | Status: DC | PRN

## 2021-03-23 MED ORDER — LIDOCAINE HCL (PF) 1 % IJ SOLN
INTRAMUSCULAR | Status: AC
  Filled 2021-03-23: qty 30

## 2021-03-23 MED ORDER — LIDOCAINE HCL (PF) 1 % IJ SOLN
INTRAMUSCULAR | Status: DC | PRN
  Administered 2021-03-23: 5 mL via INTRADERMAL

## 2021-03-23 MED ORDER — SODIUM CHLORIDE 0.9 % IV SOLN: INTRAVENOUS | Status: AC

## 2021-03-23 MED ORDER — VERAPAMIL HCL 2.5 MG/ML IV SOLN
INTRAVENOUS | Status: DC | PRN
  Administered 2021-03-23: 10 mL via INTRA_ARTERIAL

## 2021-03-23 MED ORDER — HEPARIN (PORCINE) IN NACL 1000-0.9 UT/500ML-% IV SOLN
INTRAVENOUS | Status: DC | PRN
  Administered 2021-03-23 (×2): 500 mL

## 2021-03-23 MED ORDER — ASPIRIN 81 MG PO CHEW
81.0000 mg | CHEWABLE_TABLET | ORAL | Status: DC
Start: 2021-03-23 — End: 2021-03-23

## 2021-03-23 MED ORDER — MIDAZOLAM HCL 2 MG/2ML IJ SOLN
INTRAMUSCULAR | Status: AC
  Filled 2021-03-23: qty 2

## 2021-03-23 MED ORDER — ACETAMINOPHEN 325 MG PO TABS: 650.0000 mg | ORAL_TABLET | ORAL | Status: DC | PRN

## 2021-03-23 MED ORDER — HEPARIN SODIUM (PORCINE) 1000 UNIT/ML IJ SOLN
INTRAMUSCULAR | Status: DC | PRN
  Administered 2021-03-23: 8000 [IU] via INTRAVENOUS

## 2021-03-23 MED ORDER — SODIUM CHLORIDE 0.9 % IV SOLN: INTRAVENOUS | Status: DC

## 2021-03-23 MED ORDER — ONDANSETRON HCL 4 MG/2ML IJ SOLN: 4.0000 mg | Freq: Four times a day (QID) | INTRAMUSCULAR | Status: DC | PRN

## 2021-03-23 SURGICAL SUPPLY — 12 items

## 2021-03-23 NOTE — Research (Signed)
IDENTIFY Informed Consent                  Subject Name: David [name]. Buckle   Subject met inclusion and exclusion criteria.  The informed consent form, study requirements and expectations were reviewed with the subject and questions and concerns were addressed prior to the signing of the consent form.  The subject verbalized understanding of the trial requirements.  The subject agreed to participate in the IDENTIFY trial and signed the informed consent at 10:21AM on 03/23/21.  The informed consent was obtained prior to performance of any protocol-specific procedures for the subject.  A copy of the signed informed consent was given to the subject and a copy was placed in the subject's medical record.    Ledon Snare , Research Assistant

## 2021-03-23 NOTE — Interval H&P Note (Signed)
History and Physical Interval Note:  03/23/2021 12:04 PM  David [name]  has presented today for surgery, with the diagnosis of aortic stenosis.  The various methods of treatment have been discussed with the patient and family. After consideration of risks, benefits and other options for treatment, the patient has consented to  Procedure(s): RIGHT/LEFT HEART CATH AND CORONARY ANGIOGRAPHY (N/A) as a surgical intervention.  The patient's history has been reviewed, patient examined, no change in status, stable for surgery.  I have reviewed the patient's chart and labs.  Questions were answered to the patient's satisfaction.    Cath Lab Visit (complete for each Cath Lab visit)  Clinical Evaluation Leading to the Procedure:   ACS: No.  Non-ACS:    Anginal Classification: CCS II  Anti-ischemic medical therapy: Maximal Therapy (2 or more classes of medications)  Non-Invasive Test Results: No non-invasive testing performed  Prior CABG: No previous CABG        Lauree Chandler

## 2021-03-23 NOTE — Discharge Instructions (Addendum)
Resume Xarelto tomorrow (03/24/21) if no bleeding from cath site in right arm.    Radial Site Care  This sheet gives you information about how to care for yourself after your procedure. Your health care provider may also give you more specific instructions. If you have problems or questions, contact your health care provider. What can I expect after the procedure? After the procedure, it is common to have: Bruising and tenderness at the catheter insertion area. Follow these instructions at home: Medicines Take over-the-counter and prescription medicines only as told by your health care provider. Insertion site care Follow instructions from your health care provider about how to take care of your insertion site. Make sure you: Wash your hands with soap and water before you remove your bandage (dressing). If soap and water are not available, use hand sanitizer. May remove dressing in 24 hours. Check your insertion site every day for signs of infection. Check for: Redness, swelling, or pain. Fluid or blood. Pus or a bad smell. Warmth. Do no take baths, swim, or use a hot tub for 5 days. You may shower 24-48 hours after the procedure. Remove the dressing and gently wash the site with plain soap and water. Pat the area dry with a clean towel. Do not rub the site. That could cause bleeding. Do not apply powder or lotion to the site. Activity  For 24 hours after the procedure, or as directed by your health care provider: Do not flex or bend the affected arm. Do not push or pull heavy objects with the affected arm. Do not drive yourself home from the hospital or clinic. You may drive 24 hours after the procedure. Do not operate machinery or power tools. KEEP ARM ELEVATED THE REMAINDER OF THE DAY. Do not push, pull or lift anything that is heavier than 10 lb for 5 days. Ask your health care provider when it is okay to: Return to work or school. Resume usual physical activities or  sports. Resume sexual activity. General instructions If the catheter site starts to bleed, raise your arm and put firm pressure on the site. If the bleeding does not stop, get help right away. This is a medical emergency. DRINK PLENTY OF FLUIDS FOR THE NEXT 2-3 DAYS. No alcohol consumption for 24 hours after receiving sedation. If you went home on the same day as your procedure, a responsible adult should be with you for the first 24 hours after you arrive home. Keep all follow-up visits as told by your health care provider. This is important. Contact a health care provider if: You have a fever. You have redness, swelling, or yellow drainage around your insertion site. Get help right away if: You have unusual pain at the radial site. The catheter insertion area swells very fast. The insertion area is bleeding, and the bleeding does not stop when you hold steady pressure on the area. Your arm or hand becomes pale, cool, tingly, or numb. These symptoms may represent a serious problem that is an emergency. Do not wait to see if the symptoms will go away. Get medical help right away. Call your local emergency services (911 in the [place] Do not drive yourself to the hospital. Summary After the procedure, it is common to have bruising and tenderness at the site. Follow instructions from your health care provider about how to take care of your radial site wound. Check the wound every day for signs of infection.  This information is not intended to replace advice given  to you by your health care provider. Make sure you discuss any questions you have with your health care provider. Document Revised: 09/11/2017 Document Reviewed: 09/11/2017 Elsevier Patient Education  2020 [employer].

## 2021-03-23 NOTE — Progress Notes (Signed)
Pt ambulated without difficulty or bleeding.   Discharged home with his friend, Clair Gulling, who will drive and stay with pt x 24 hrs.

## 2021-03-28 ENCOUNTER — Other Ambulatory Visit (HOSPITAL_COMMUNITY): Payer: 59 | Admitting: Dentistry

## 2021-03-29 ENCOUNTER — Ambulatory Visit (HOSPITAL_BASED_OUTPATIENT_CLINIC_OR_DEPARTMENT_OTHER)
Admission: RE | Admit: 2021-03-29 | Discharge: 2021-03-29 | Disposition: A | Discharge status: Home / Self Care | Payer: 59 | Source: Ambulatory Visit | Attending: Cardiovascular Disease | Admitting: Cardiovascular Disease

## 2021-03-29 ENCOUNTER — Other Ambulatory Visit: Payer: Self-pay

## 2021-03-29 ENCOUNTER — Ambulatory Visit (HOSPITAL_COMMUNITY)
Admission: RE | Admit: 2021-03-29 | Discharge: 2021-03-29 | Disposition: A | Discharge status: Home / Self Care | Payer: 59 | Source: Ambulatory Visit | Attending: Cardiovascular Disease | Admitting: Cardiovascular Disease

## 2021-03-29 DIAGNOSIS — I350 Nonrheumatic aortic (valve) stenosis: Secondary | ICD-10-CM

## 2021-03-29 MED ORDER — NITROGLYCERIN 0.4 MG SL SUBL: 0.8000 mg | SUBLINGUAL_TABLET | SUBLINGUAL | Status: DC | PRN

## 2021-03-29 NOTE — Progress Notes (Signed)
Carotid has been completed.   Preliminary results in CV Proc.   Abram Sander 03/29/2021 11:56 AM

## 2021-03-30 MED ORDER — IOHEXOL 350 MG/ML SOLN
100.0000 mL | Freq: Once | INTRAVENOUS | Status: AC | PRN
  Administered 2021-03-29: 100 mL via INTRAVENOUS

## 2021-03-31 ENCOUNTER — Ambulatory Visit (INDEPENDENT_AMBULATORY_CARE_PROVIDER_SITE_OTHER): Payer: 59 | Admitting: Dentistry

## 2021-03-31 ENCOUNTER — Encounter (HOSPITAL_COMMUNITY): Payer: Self-pay | Admitting: Dentistry

## 2021-03-31 ENCOUNTER — Other Ambulatory Visit: Payer: Self-pay

## 2021-03-31 DIAGNOSIS — K0530 Chronic periodontitis, unspecified: Secondary | ICD-10-CM

## 2021-03-31 DIAGNOSIS — Z01818 Encounter for other preprocedural examination: Secondary | ICD-10-CM

## 2021-03-31 DIAGNOSIS — K08109 Complete loss of teeth, unspecified cause, unspecified class: Secondary | ICD-10-CM

## 2021-03-31 DIAGNOSIS — K032 Erosion of teeth: Secondary | ICD-10-CM

## 2021-03-31 DIAGNOSIS — K0889 Other specified disorders of teeth and supporting structures: Secondary | ICD-10-CM

## 2021-03-31 DIAGNOSIS — K083 Retained dental root: Secondary | ICD-10-CM

## 2021-03-31 DIAGNOSIS — K0850 Unsatisfactory restoration of tooth, unspecified: Secondary | ICD-10-CM

## 2021-03-31 DIAGNOSIS — Z7901 Long term (current) use of anticoagulants: Secondary | ICD-10-CM | POA: Diagnosis not present

## 2021-03-31 DIAGNOSIS — F40232 Fear of other medical care: Secondary | ICD-10-CM | POA: Diagnosis not present

## 2021-03-31 DIAGNOSIS — K029 Dental caries, unspecified: Secondary | ICD-10-CM

## 2021-03-31 DIAGNOSIS — K045 Chronic apical periodontitis: Secondary | ICD-10-CM

## 2021-03-31 DIAGNOSIS — I350 Nonrheumatic aortic (valve) stenosis: Secondary | ICD-10-CM | POA: Diagnosis not present

## 2021-03-31 DIAGNOSIS — K030 Excessive attrition of teeth: Secondary | ICD-10-CM

## 2021-03-31 NOTE — Patient Instructions (Signed)
[place] Department of Dental Medicine Kari Montero B. Anelle Parlow, D.M.D. Phone: [phone] Fax: [phone]   It was a pleasure seeing you today!  Please refer to the information below regarding your dental visit with us.  Call us if any questions or concerns come up after you leave.   Thank you for letting us provide care for you.  If there is anything we can do for you, please let us know.    HEART VALVES AND MOUTH CARE   FACTS: If you have any infection in your mouth, it can infect your heart valve. If you heart valve is infected, you will be seriously ill. Infections in the mouth can be SILENT and do not always cause pain. Examples of infections in the mouth are gum disease, dental cavities, and abscesses. Some possible signs of infection are: Bad breath, bleeding gums, or teeth that are sensitive to sweets, hot, and/or cold. There are many other signs as well.   WHAT YOU HAVE TO DO: Brush your teeth after meals and at bedtime.  Spend at least 2 minutes brushing well, especially behind your back teeth and all around your teeth that stand alone.  Brush at the gumline also. Do not go to bed without brushing your teeth and flossing. If your gums bleed when you brush or floss, do NOT stop brushing or flossing.  Bleeding can be a sign of inflammation or irritation from bacteria.  It usually means that your gums need more attention and better cleaning.  If your dentist or Dr. Andrey Hoobler gave you a prescription mouthwash to use, make sure to use it as directed. If you run out of the medication, get a refill at the pharmacy. If you were given any other medications or directions by your dentist, please follow them.  If you did not understand the directions or forget what you were told, please call.  We will be happy to refresh your memory. If you need antibiotics before dental procedures, make sure you take them one hour prior to every dental visit as directed.  Get a dental check-up every 4-6  months in order to keep your mouth healthy, or to find and treat any new infection. You will most likely need your teeth cleaned or gums treated at the same time. If you are not able to come in for your scheduled appointment, call your dentist as soon as possible to reschedule. If you have a problem in between dental visits, call your dentist.   QUESTIONS? Call our office during office hours [phone].    WE ARE A TEAM.  OUR GOAL IS:  HEALTHY MOUTH, HEALTHY HEART   

## 2021-03-31 NOTE — Progress Notes (Signed)
Department of Dental Medicine      OUTPATIENT CONSULT  Service Date:   03/31/2021  Patient Name:   David [name] Date of Birth:   [birth year] Medical Record Number: [id number]  Referring Provider:                Darlina Guys, M.D.        TODAY'S VISIT:   Assessment:   There are no current signs of acute odontogenic infection including abscess, edema or erythema, or suspicious lesion requiring biopsy.   There are teeth with severe decay and chronic periapical infection and teeth with severe bone loss due to periodontal disease with a poor/hopeless prognosis.  Recommendations:   Extractions of all indicated teeth to decrease the risk of perioperative and postoperative systemic infection and complications. Plan:   Tentatively plan to schedule the patient for the operating room for all dental treatment Thursday August 18th pending medical team's recommendations and no contraindications to general anesthesia. Xarelto does not need to be held for tooth extractions. Discuss case with medical team and coordinate treatment as needed.  Discussed in detail all treatment options and recommendations with the patient and they are agreeable to the plan.    Thank you for consulting with [facility] and for the opportunity to participate in this patient's treatment.  Should you have any questions or concerns, please contact the [facility] at [phone].        PROGRESS NOTE:   COVID-19 SCREENING:  The patient denies symptoms concerning for COVID-19 infection including fever, chills, cough, or newly developed shortness of breath.   HISTORY OF PRESENT ILLNESS: David [name] is a very pleasant 65 y.o. male with h/o HTN, sleep apnea, allergic rhinitis, arthritis, h/o tobacco use (smoking), anxiety, paroxysmal atrial fibrillation on long-term use of anticoagulation (Xarelto), obesity and hyperlipidemia who was recently diagnosed with severe aortic stenosis and is anticipating  TAVR.  The patient presents today for a medically necessary dental consultation as part of their pre-cardiac surgery work-up.   DENTAL HISTORY: The patient reports that it has been years since he has last seen a dentist.  His last dental visit was for an extraction as an emergency visit.  He currently denies any dental/orofacial pain or sensitivity. Patient is able to manage oral secretions.  Patient denies dysphagia, odynophagia, dysphonia, SOB and neck pain.  Patient denies fever, rigors and malaise.   CHIEF COMPLAINT:  Here for a preoperative dental exam.   Patient Active Problem List   Diagnosis Date Noted  . Severe aortic stenosis   . Arthritis of left knee 03/10/2021  . Anxiety 02/24/2019  . Family history of colon cancer in father 02/24/2019  . Venous insufficiency 08/21/2017  . Seborrheic dermatitis 05/14/2017  . Nasal valve collapse 04/17/2017  . Sensorineural hearing loss (SNHL), bilateral 04/17/2017  . Tinnitus of right ear 03/21/2017  . History of CVA (cerebrovascular accident) 06/22/2016  . Aortic stenosis 10/05/2015  . Allergic rhinitis 05/08/2015  . Former smoker 05/08/2015  . Morbid obesity due to excess calories ([place]) 01/12/2015  . Symptomatic varicose veins of left lower extremity 07/06/2014  . PAF (paroxysmal atrial fibrillation) ([place]) 03/18/2013  . OSA treated with BiPAP 03/18/2013  . RBBB 03/18/2013  . HTN (hypertension) 03/18/2013  . Aortic aneurysm ([place]) 03/18/2013   Past Medical History:  Diagnosis Date  . Allergy    RHINITIS  . Arthritis    thumb  . Complication of anesthesia    2012 shoulder surgery: patient unsure of  what the problem was   . GERD (gastroesophageal reflux disease)    "not really"  . History of echocardiogram 03/29/2009   EF 50-55%; mild concentric LVH;   . History of nuclear stress test 08/28/2007   moderate diaphragmatic attenuation; EKG neg for ischemia   . Hx of adenomatous colonic polyps   . Hyperlipidemia   . Hypertension     takes Diovan and Toprol  . Obesity   . PAF (paroxysmal atrial fibrillation) (HCC)    associated with OSA  . Right bundle branch block   . Rosacea   . Shortness of breath    With activity  . Sleep apnea    CPAP  . Smoker   . Systolic murmur    Past Surgical History:  Procedure Laterality Date  . COLONOSCOPY WITH PROPOFOL N/A 11/25/2020   Procedure: COLONOSCOPY WITH PROPOFOL;  Surgeon: Carol Ada, MD;  Location: [facility];  Service: Endoscopy;  Laterality: N/A;  . FINGER SURGERY     Reconstruction of Left thumb and L index finger.  Reconstruction   . HEMOSTASIS CLIP PLACEMENT  11/25/2020   Procedure: HEMOSTASIS CLIP PLACEMENT;  Surgeon: Carol Ada, MD;  Location: [facility];  Service: Endoscopy;;  . KNEE ARTHROSCOPY    . POLYPECTOMY  11/25/2020   Procedure: POLYPECTOMY;  Surgeon: Carol Ada, MD;  Location: [facility];  Service: Endoscopy;;  . RIGHT/LEFT HEART CATH AND CORONARY ANGIOGRAPHY N/A 03/23/2021   Procedure: RIGHT/LEFT HEART CATH AND CORONARY ANGIOGRAPHY;  Surgeon: Burnell Blanks, MD;  Location: [facility];  Service: Cardiovascular;  Laterality: N/A;  . SHOULDER CLOSED REDUCTION     Procedure: CLOSED REDUCTION SHOULDER;  Surgeon: Lawrence Santiago [facility];  Location: [facility];  Service: Orthopedics;  Laterality: Right;  . SHOULDER OPEN ROTATOR CUFF REPAIR  08/11/2011   Procedure: ROTATOR CUFF REPAIR SHOULDER OPEN;  Surgeon: Willa Frater III;  Location: [facility];  Service: Orthopedics;  Laterality: Right;  . SUBMUCOSAL LIFTING INJECTION  11/25/2020   Procedure: SUBMUCOSAL LIFTING INJECTION;  Surgeon: Carol Ada, MD;  Location: [facility];  Service: Endoscopy;;  . [place] INJECTION  11/25/2020   Procedure: SUBMUCOSAL TATTOO INJECTION;  Surgeon: Carol Ada, MD;  Location: [facility];  Service: Endoscopy;;  . TONSILLECTOMY     as a child   Allergies  Allergen Reactions  . Codeine Shortness Of Breath and Swelling  . Morphine And Related  Shortness Of Breath and Swelling   Current Outpatient Medications  Medication Sig Dispense Refill  . ALPRAZolam (XANAX) 1 MG tablet Take 1 tablet (1 mg total) by mouth at bedtime as needed for sleep. 30 tablet 0  . atorvastatin (LIPITOR) 10 MG tablet TAKE 1 TABLET BY MOUTH EVERY DAY AT 6 PM 90 tablet 3  . cephALEXin (KEFLEX) 500 MG capsule Take 1 capsule (500 mg total) by mouth 4 (four) times daily. 90 capsule 0  . diltiazem (CARDIZEM CD) 120 MG 24 hr capsule Take 1 capsule (120 mg total) by mouth daily. 90 capsule 3  . furosemide (LASIX) 40 MG tablet Take 1 tablet (40 mg total) by mouth daily. 90 tablet 3  . HYDROcodone-acetaminophen (NORCO/VICODIN) 5-325 MG tablet Take 1 tablet by mouth every 6 (six) hours as needed for moderate pain. 30 tablet 0  . hydrocortisone 2.5 % cream Apply to external ears as needed for itching. 30 g 5  . methocarbamol (ROBAXIN) 500 MG tablet Take 1 tablet (500 mg total) by mouth 4 (four) times daily as needed for muscle spasms. [place]  tablet 0  . metoprolol succinate (TOPROL-XL) 50 MG 24 hr tablet Take with or immediately following a meal. 90 tablet 3  . potassium chloride SA (KLOR-CON) 20 MEQ tablet Take 1 tablet (20 mEq total) by mouth daily. 90 tablet 3  . rivaroxaban (XARELTO) 20 MG TABS tablet TAKE 1 TABLET(20 MG) BY MOUTH DAILY WITH SUPPER 90 tablet 3  . valsartan-hydrochlorothiazide (DIOVAN-HCT) 160-25 MG tablet Take 0.5 tablets by mouth daily. Take half tab daily for the next 3 days and if blood pressure sustains above 120 can start taking 1 tablet daily after that     No current facility-administered medications for this visit.    LABS: Lab Results  Component Value Date   WBC 8.5 03/16/2021   HGB 15.0 03/23/2021   HCT 44.0 03/23/2021   MCV 90 03/16/2021   PLT 284 03/16/2021      Component Value Date/Time   NA 141 03/23/2021 1256   NA 137 03/16/2021 1456   K 4.1 03/23/2021 1256   CL 100 03/16/2021 1456   CO2 19 (L) 03/16/2021 1456   GLUCOSE 103 (H)  03/16/2021 1456   GLUCOSE 98 05/23/2020 0719   BUN 19 03/16/2021 1456   CREATININE 1.11 03/16/2021 1456   CREATININE 1.01 04/09/2017 1457   CALCIUM 9.7 03/16/2021 1456   GFRNONAA 84 09/15/2020 1629   GFRAA 97 09/15/2020 1629   Lab Results  Component Value Date   INR 1.01 06/21/2016   INR 1.09 06/16/2015   No results found for: PTT  Social History   Socioeconomic History  . Marital status: Single    Spouse name: Not on file  . Number of children: Not on file  . Years of education: Not on file  . Highest education level: Not on file  Occupational History  . Not on file  Tobacco Use  . Smoking status: Former    Packs/day: 1.00    Years: 40.00    Pack years: 40.00    Types: Cigarettes    Quit date: 06/20/2016    Years since quitting: 4.7  . Smokeless tobacco: Never  Vaping Use  . Vaping Use: Never used  Substance and Sexual Activity  . Alcohol use: Yes    Alcohol/week: 11.0 standard drinks    Types: 6 Cans of beer, 5 Shots of liquor per week  . Drug use: No  . Sexual activity: Not Currently  Other Topics Concern  . Not on file  Social History Narrative  . Not on file   Social Determinants of Health   Financial Resource Strain: Not on file  Food Insecurity: Not on file  Transportation Needs: Not on file  Physical Activity: Not on file  Stress: Not on file  Social Connections: Not on file  Intimate Partner Violence: Not on file   Family History  Problem Relation Age of Onset  . Heart disease Mother   . Cancer - Prostate Maternal Grandfather   . Cancer - Prostate Paternal Grandfather   . Cancer - Colon Father   . Heart attack Father 60  . Cancer Paternal Uncle   . Anesthesia problems Neg Hx   . Stroke Neg Hx      REVIEW OF SYSTEMS:  Reviewed with the patient as per HPI. Psych:  (+) Dental phobia   VITAL SIGNS: BP 124/67 (BP Location: Right Arm, Patient Position: Sitting, Cuff Size: Normal)   Pulse 64   Temp 98.1 F (36.7 C) (Oral)    PHYSICAL  EXAM: General:  Well-developed, comfortable  and in no apparent distress. Neurological:  Alert and oriented to person, place and  time. Extraoral:  Facial symmetry present without any edema or erythema.  No swelling or lymphadenopathy.  TMJ asymptomatic without clicks or crepitations. Intraoral:  Soft tissues appear well-perfused and mucous membranes moist.  FOM and vestibules soft and not raised. Oral cavity without mass or lesion. No signs of infection, parulis, sinus tract, edema or erythema evident upon exam.   DENTAL EXAM: Hard tissue exam completed and charted. Overall impression:  Fair remaining dentition. Oral hygiene:  Fair   Periodontal:  Pink, healthy gingival tissue with blunted papilla.  Generalized moderate supra-and subgingival calculus accumulation.  (+) Mobility- #19 class III, #28 class II Caries:  #2, #3, #8, #9, #19, #29 Retained root tips:  #14, #15 Defective restorations:  #2O composite has recurrent decay Removable/fixed prosthodontics:  Patient denies wearing partial dentures. Occlusion:  Unable to assess molar occlusion.  Non-functional and supra-erupted teeth- #2, #3 and #19. Other findings:  (+) Attrition/wear- #7I, #8I, #9I and #23-#26 incisal.  (+) Abfraction/flexure- #2B(V), #3B(V), #4B(V) and #29(B)V.   RADIOGRAPHIC EXAM:  PAN and Full Mouth Series exposed and interpreted.  Condyles/fossas not visualized on image.  No evidence of abnormal pathology.  All visualized osseous structures appear WNL. #2, #3 and #19 appear supra-erupted with no opposing dentition.   Generalized moderate horizontal bone loss with areas of localized severe consistent with moderate to severe periodontitis. Bone loss through furcations on teeth #2, #3 and #19. Radiographic calculus accumulation evident. Missing teeth, existing restorations. Caries- #8I, #9I, #19 root caries in furcation. Retained root tips #14 and #15 with periapical radiolucencies.    ASSESSMENT:  1.  Severe  aortic stenosis 2.  Preoperative dental exam 3.  Long-term use of anticoagulation (on Xarelto) 4.  Missing teeth 5.  Caries 6.  Retained root tips 7.  Chronic apical periodontitis 8.  Accretions on teeth 9.  Chronic periodontitis 10.  Loose teeth 11.  Defective dental restoration 12.  Attrition/wear 13.  Abfraction/flexure 14.  Postoperative bleeding risk 15.  Dental Phobia   PLAN AND RECOMMENDATIONS: I discussed the risks, benefits, and complications of various scenarios with the patient in relationship to their medical and dental conditions, which included systemic infection such as endocarditis, bacteremia or other serious issues that could potentially occur either before, during or after their anticipated heart surgery if dental/oral concerns are not addressed.  I explained that if any chronic or acute dental/oral infection(s) are addressed and subsequently not maintained following medical optimization and recovery, their risk of the previously mentioned complications are just as high and could potentially occur postoperatively.  I explained all significant findings of the dental consultation with the patient including generalized bone loss, which is severe in some teeth, consistent with gum disease and teeth that are severely decayed/broken down as well as teeth with smaller cavities, and the recommended care including extractions of teeth numbers 2, 3, 14, 15 and 19 since these teeth are all chronically infected and/or have a poor/hopeless prognosis in order to optimize them for heart surgery from a dental standpoint.  The patient verbalized understanding of all findings, discussion, and recommendations. We then discussed various treatment options to include no treatment, multiple extractions with alveoloplasty, pre-prosthetic surgery as indicated, periodontal therapy, dental restorations, root canal therapy, crown and bridge therapy, implant therapy, and replacement of missing teeth as  indicated.  The patient verbalized understanding of all options, and currently wishes to proceed with extractions of teeth numbers 2,  3, 14, 15 and 19 with alveoloplasty as needed.  Due to the patient's severe dental phobia, multiple comorbidities and postoperative bleeding risk, recommend to complete all dental treatment in the operating room under general anesthesia.     Plan to discuss all findings and recommendations with medical team and coordinate future care as needed.  Tentatively plan to schedule the patient for the OR on Thursday, August 18th pending medical team's recommendations.   All questions and concerns were invited and addressed.  The patient tolerated today's visit well and departed in stable condition.  I spent in excess of 120 minutes during the conduct of this consultation and >50% of this time involved direct face-to-face encounter for counseling and/or coordination of the patient's care.  [place] Benson [place], D.M.D.

## 2021-04-07 ENCOUNTER — Other Ambulatory Visit: Payer: Self-pay

## 2021-04-07 ENCOUNTER — Encounter (HOSPITAL_COMMUNITY): Payer: Self-pay | Admitting: Dentistry

## 2021-04-07 NOTE — Progress Notes (Signed)
PCP - Dr. Redmond School Cardiologist - Dr. Debara Pickett EKG - 03/16/21 Chest x-ray -  ECHO - 02/20/21 Cardiac Cath - 03/23/21 CPAP - BiPAP    Blood Thinner Instructions: per MD note, pt does not need to stop Xarelto for surgery - pt aware of this Aspirin Instructions:   ERAS Protcol -   COVID TEST- n/a  Anesthesia review: n/a  -------------  SDW INSTRUCTIONS:  Your procedure is scheduled on Tuesday 04/11/21. Please report to [facility] Main Entrance "A" at 0530 A.M., and check in at the Admitting office. Call this number if you have problems the morning of surgery: [phone]   Remember: Do not eat  or drink after midnight the night before your surgery    Medications to take morning of surgery with a sip of water include: atorvastatin (LIPITOR) diltiazem (CARDIZEM CD) HYDROcodone-acetaminophen (NORCO/VICODIN) if needed metoprolol succinate (TOPROL-XL)  methocarbamol (ROBAXIN) if needed  As of today, STOP taking any Aspirin (unless otherwise instructed by your surgeon), Aleve, Naproxen, Ibuprofen, Motrin, Advil, Goody's, BC's, all herbal medications, fish oil, and all vitamins.    The Morning of Surgery Do not wear jewelry Do not wear lotions, powders, colognes, or deodorant Do not bring valuables to the hospital. [facility] is not responsible for any belongings or valuables.  If you are a smoker, DO NOT Smoke 24 hours prior to surgery  If you wear a CPAP at night please bring your mask the morning of surgery   Remember that you must have someone to transport you home after your surgery, and remain with you for 24 hours if you are discharged the same day.  Please bring cases for contacts, glasses, hearing aids, dentures or bridgework because it cannot be worn into surgery.   Patients discharged the day of surgery will not be allowed to drive home.   Please shower the NIGHT BEFORE/MORNING OF SURGERY (use antibacterial soap like DIAL soap if possible). Wear comfortable clothes the  morning of surgery. Oral Hygiene is also important to reduce your risk of infection.  Remember - BRUSH YOUR TEETH THE MORNING OF SURGERY WITH YOUR REGULAR TOOTHPASTE  Patient denies shortness of breath, fever, cough and chest pain.

## 2021-04-10 ENCOUNTER — Ambulatory Visit (INDEPENDENT_AMBULATORY_CARE_PROVIDER_SITE_OTHER): Payer: 59 | Admitting: Family Medicine

## 2021-04-10 ENCOUNTER — Other Ambulatory Visit: Payer: Self-pay

## 2021-04-10 ENCOUNTER — Encounter: Payer: Self-pay | Admitting: Family Medicine

## 2021-04-10 VITALS — BP 118/74 | HR 79 | Temp 98.7°F | Wt >= 6400 oz

## 2021-04-10 DIAGNOSIS — I700 Atherosclerosis of aorta: Secondary | ICD-10-CM | POA: Diagnosis not present

## 2021-04-10 DIAGNOSIS — K7469 Other cirrhosis of liver: Secondary | ICD-10-CM | POA: Diagnosis not present

## 2021-04-10 DIAGNOSIS — I350 Nonrheumatic aortic (valve) stenosis: Secondary | ICD-10-CM | POA: Diagnosis not present

## 2021-04-10 NOTE — Anesthesia Preprocedure Evaluation (Addendum)
Anesthesia Evaluation  Patient identified by MRN, date of birth, ID band Patient awake    Reviewed: Allergy & Precautions, NPO status , Patient's Chart, lab work & pertinent test results, reviewed documented beta blocker date and time   History of Anesthesia Complications Negative for: history of anesthetic complications  Airway Mallampati: I  TM Distance: >3 FB Neck ROM: Full    Dental  (+) Dental Advisory Given   Pulmonary shortness of breath, sleep apnea and Continuous Positive Airway Pressure Ventilation , former smoker,    breath sounds clear to auscultation       Cardiovascular hypertension, Pt. on medications and Pt. on home beta blockers (-) angina(-) CAD + dysrhythmias Atrial Fibrillation + Valvular Problems/Murmurs AS  Rhythm:Regular Rate:Normal + Systolic murmurs 0000000 ECHO: EF 60-65%, normal LVF, mod LVH, severe AS with AVA 0.67 cm2, mean gradient 49 mmHg   Neuro/Psych Anxiety negative neurological ROS     GI/Hepatic Neg liver ROS, GERD  Controlled,  Endo/Other  Morbid obesity  Renal/GU negative Renal ROS     Musculoskeletal  (+) Arthritis ,   Abdominal (+) + obese,   Peds  Hematology xarelto   Anesthesia Other Findings   Reproductive/Obstetrics                            Anesthesia Physical Anesthesia Plan  ASA: 4  Anesthesia Plan: General   Post-op Pain Management:    Induction: Intravenous  PONV Risk Score and Plan: 2 and Ondansetron and Dexamethasone  Airway Management Planned: Oral ETT and Video Laryngoscope Planned  Additional Equipment: Arterial line  Intra-op Plan:   Post-operative Plan: Extubation in OR  Informed Consent: I have reviewed the patients History and Physical, chart, labs and discussed the procedure including the risks, benefits and alternatives for the proposed anesthesia with the patient or authorized representative who has indicated his/her  understanding and acceptance.     Dental advisory given  Plan Discussed with: CRNA and Surgeon  Anesthesia Plan Comments: (Arterial catheter vs ClearSight beat-to-beat BP monitoring)       Anesthesia Quick Evaluation

## 2021-04-10 NOTE — Progress Notes (Signed)
   Subjective:    Patient ID: David [name], male    DOB: 06-Feb-1956, 65 y.o.   MRN: [id number]  HPI He is here for consult concerning multiple issues.  He does have aortic stenosis and is scheduled for surgery in the near future.  Aortic atherosclerosis was noted during the work-up.  He also had some coronary artery calcification however the cath apparently was clear.  Also CT scan did show evidence of cirrhosis.  His drinking history is at this point quite minimal.  Review of the record indicates and negative hepatitis C.  He has not had hepatitis B screening.  He is morbidly obese.  He states his leg is not giving him any difficulty.  He is also scheduled for dental surgery prior to his aortic valve replacement.   Review of Systems     Objective:   Physical Exam  Alert and in no distress otherwise not examined      Assessment & Plan:  Nonrheumatic aortic valve stenosis  Other cirrhosis of liver (HCC) - Plan: Hepatitis B surface antibody,qualitative  Morbid obesity due to excess calories ([place])  Aortic atherosclerosis ([place]) I discussed the surgery he is about to have which will be done by cardiology.  He is already on a statin drug.  Discussed the cirrhosis with him and he will probably need to be referred to GI after this to look at the underlying etiology of the cirrhosis.  He could be related to his weight but like GI further evaluate that.  Over 30 minutes spent discussing all these issues with him.

## 2021-04-11 ENCOUNTER — Encounter: Payer: 59 | Admitting: Surgery

## 2021-04-11 ENCOUNTER — Encounter (HOSPITAL_COMMUNITY)
Admission: RE | Disposition: A | Discharge status: Home / Self Care | Payer: Self-pay | Source: Home / Self Care | Attending: Dentistry

## 2021-04-11 ENCOUNTER — Ambulatory Visit (HOSPITAL_COMMUNITY): Payer: 59 | Admitting: Anesthesiology

## 2021-04-11 ENCOUNTER — Ambulatory Visit (HOSPITAL_COMMUNITY)
Admission: RE | Admit: 2021-04-11 | Discharge: 2021-04-11 | Disposition: A | Discharge status: Home / Self Care | Payer: 59 | Attending: Dentistry | Admitting: Dentistry

## 2021-04-11 ENCOUNTER — Encounter (HOSPITAL_COMMUNITY): Payer: Self-pay | Admitting: Dentistry

## 2021-04-11 DIAGNOSIS — K029 Dental caries, unspecified: Secondary | ICD-10-CM

## 2021-04-11 DIAGNOSIS — Z885 Allergy status to narcotic agent status: Secondary | ICD-10-CM | POA: Insufficient documentation

## 2021-04-11 DIAGNOSIS — Z7901 Long term (current) use of anticoagulants: Secondary | ICD-10-CM | POA: Insufficient documentation

## 2021-04-11 DIAGNOSIS — Z8042 Family history of malignant neoplasm of prostate: Secondary | ICD-10-CM | POA: Insufficient documentation

## 2021-04-11 DIAGNOSIS — K056 Periodontal disease, unspecified: Secondary | ICD-10-CM | POA: Diagnosis not present

## 2021-04-11 DIAGNOSIS — Z800 Family history of malignant neoplasm of digestive organs: Secondary | ICD-10-CM | POA: Insufficient documentation

## 2021-04-11 DIAGNOSIS — Z87891 Personal history of nicotine dependence: Secondary | ICD-10-CM | POA: Diagnosis not present

## 2021-04-11 DIAGNOSIS — Z6841 Body Mass Index (BMI) 40.0 and over, adult: Secondary | ICD-10-CM | POA: Diagnosis not present

## 2021-04-11 DIAGNOSIS — I350 Nonrheumatic aortic (valve) stenosis: Secondary | ICD-10-CM | POA: Insufficient documentation

## 2021-04-11 DIAGNOSIS — I10 Essential (primary) hypertension: Secondary | ICD-10-CM | POA: Insufficient documentation

## 2021-04-11 DIAGNOSIS — G4733 Obstructive sleep apnea (adult) (pediatric): Secondary | ICD-10-CM | POA: Insufficient documentation

## 2021-04-11 DIAGNOSIS — Z8249 Family history of ischemic heart disease and other diseases of the circulatory system: Secondary | ICD-10-CM | POA: Insufficient documentation

## 2021-04-11 DIAGNOSIS — E785 Hyperlipidemia, unspecified: Secondary | ICD-10-CM | POA: Diagnosis not present

## 2021-04-11 DIAGNOSIS — K0530 Chronic periodontitis, unspecified: Secondary | ICD-10-CM

## 2021-04-11 DIAGNOSIS — I480 Paroxysmal atrial fibrillation: Secondary | ICD-10-CM | POA: Diagnosis not present

## 2021-04-11 DIAGNOSIS — Z79899 Other long term (current) drug therapy: Secondary | ICD-10-CM | POA: Insufficient documentation

## 2021-04-11 HISTORY — PX: MULTIPLE EXTRACTIONS WITH ALVEOLOPLASTY: SHX5342

## 2021-04-11 LAB — HEPATITIS B SURFACE ANTIBODY,QUALITATIVE: Hep B Surface Ab, Qual: NONREACTIVE

## 2021-04-11 SURGERY — MULTIPLE EXTRACTION WITH ALVEOLOPLASTY
Anesthesia: General | Site: Mouth

## 2021-04-11 MED ORDER — MIDAZOLAM HCL 2 MG/2ML IJ SOLN
INTRAMUSCULAR | Status: DC | PRN
  Administered 2021-04-11: 2 mg via INTRAVENOUS

## 2021-04-11 MED ORDER — HEMOSTATIC AGENTS (NO CHARGE) OPTIME
TOPICAL | Status: DC | PRN
  Administered 2021-04-11: 1 via TOPICAL

## 2021-04-11 MED ORDER — 0.9 % SODIUM CHLORIDE (POUR BTL) OPTIME
TOPICAL | Status: DC | PRN
  Administered 2021-04-11: 1000 mL

## 2021-04-11 MED ORDER — MIDAZOLAM HCL 2 MG/2ML IJ SOLN: 0.5000 mg | Freq: Once | INTRAMUSCULAR | Status: DC | PRN

## 2021-04-11 MED ORDER — CHLORHEXIDINE GLUCONATE 0.12 % MT SOLN
15.0000 mL | Freq: Once | OROMUCOSAL | Status: AC
  Administered 2021-04-11: 15 mL via OROMUCOSAL
  Filled 2021-04-11: qty 15

## 2021-04-11 MED ORDER — PROMETHAZINE HCL 25 MG/ML IJ SOLN: 6.2500 mg | INTRAMUSCULAR | Status: DC | PRN

## 2021-04-11 MED ORDER — LIDOCAINE-EPINEPHRINE 2 %-1:100000 IJ SOLN
INTRAMUSCULAR | Status: AC
  Filled 2021-04-11: qty 10.2

## 2021-04-11 MED ORDER — OXYMETAZOLINE HCL 0.05 % NA SOLN
NASAL | Status: AC
  Filled 2021-04-11: qty 30

## 2021-04-11 MED ORDER — BUPIVACAINE-EPINEPHRINE (PF) 0.25% -1:200000 IJ SOLN
INTRAMUSCULAR | Status: AC
  Filled 2021-04-11: qty 60

## 2021-04-11 MED ORDER — PHENYLEPHRINE 40 MCG/ML (10ML) SYRINGE FOR IV PUSH (FOR BLOOD PRESSURE SUPPORT)
PREFILLED_SYRINGE | INTRAVENOUS | Status: AC
  Filled 2021-04-11: qty 10

## 2021-04-11 MED ORDER — ACETAMINOPHEN 500 MG PO TABS
1000.0000 mg | ORAL_TABLET | Freq: Once | ORAL | Status: AC
  Administered 2021-04-11: 1000 mg via ORAL
  Filled 2021-04-11: qty 2

## 2021-04-11 MED ORDER — HYDROCODONE-ACETAMINOPHEN 5-325 MG PO TABS: 1.0000 | ORAL_TABLET | Freq: Four times a day (QID) | ORAL | 0 refills | Status: AC | PRN

## 2021-04-11 MED ORDER — FENTANYL CITRATE (PF) 100 MCG/2ML IJ SOLN: 25.0000 ug | INTRAMUSCULAR | Status: DC | PRN

## 2021-04-11 MED ORDER — PHENYLEPHRINE HCL (PRESSORS) 10 MG/ML IV SOLN
INTRAVENOUS | Status: DC | PRN
  Administered 2021-04-11: 80 ug via INTRAVENOUS

## 2021-04-11 MED ORDER — LIDOCAINE-EPINEPHRINE 2 %-1:100000 IJ SOLN
INTRAMUSCULAR | Status: DC | PRN
  Administered 2021-04-11 (×3): 1.7 mL via INTRADERMAL

## 2021-04-11 MED ORDER — OXYCODONE HCL 5 MG PO TABS: 5.0000 mg | ORAL_TABLET | Freq: Once | ORAL | Status: DC | PRN

## 2021-04-11 MED ORDER — BUPIVACAINE-EPINEPHRINE (PF) 0.5% -1:200000 IJ SOLN
INTRAMUSCULAR | Status: AC
  Filled 2021-04-11: qty 3.6

## 2021-04-11 MED ORDER — DEXAMETHASONE SODIUM PHOSPHATE 10 MG/ML IJ SOLN
INTRAMUSCULAR | Status: DC | PRN
  Administered 2021-04-11: 10 mg via INTRAVENOUS

## 2021-04-11 MED ORDER — ONDANSETRON HCL 4 MG/2ML IJ SOLN
INTRAMUSCULAR | Status: AC
  Filled 2021-04-11: qty 2

## 2021-04-11 MED ORDER — SUCCINYLCHOLINE CHLORIDE 200 MG/10ML IV SOSY
PREFILLED_SYRINGE | INTRAVENOUS | Status: AC
  Filled 2021-04-11: qty 10

## 2021-04-11 MED ORDER — PROPOFOL 10 MG/ML IV BOLUS
INTRAVENOUS | Status: DC | PRN
  Administered 2021-04-11: 50 mg via INTRAVENOUS
  Administered 2021-04-11: 150 mg via INTRAVENOUS

## 2021-04-11 MED ORDER — LACTATED RINGERS IV SOLN: INTRAVENOUS | Status: DC | PRN

## 2021-04-11 MED ORDER — SUCCINYLCHOLINE 20MG/ML (10ML) SYRINGE FOR MEDFUSION PUMP - OPTIME
INTRAMUSCULAR | Status: DC | PRN
  Administered 2021-04-11: 160 mg via INTRAVENOUS

## 2021-04-11 MED ORDER — LACTATED RINGERS IV SOLN: INTRAVENOUS | Status: DC

## 2021-04-11 MED ORDER — FENTANYL CITRATE (PF) 250 MCG/5ML IJ SOLN
INTRAMUSCULAR | Status: AC
  Filled 2021-04-11: qty 5

## 2021-04-11 MED ORDER — DEXAMETHASONE SODIUM PHOSPHATE 10 MG/ML IJ SOLN
INTRAMUSCULAR | Status: AC
  Filled 2021-04-11: qty 1

## 2021-04-11 MED ORDER — LIDOCAINE 2% (20 MG/ML) 5 ML SYRINGE
INTRAMUSCULAR | Status: AC
  Filled 2021-04-11: qty 5

## 2021-04-11 MED ORDER — FENTANYL CITRATE (PF) 250 MCG/5ML IJ SOLN
INTRAMUSCULAR | Status: DC | PRN
  Administered 2021-04-11 (×2): 50 ug via INTRAVENOUS

## 2021-04-11 MED ORDER — PROPOFOL 10 MG/ML IV BOLUS
INTRAVENOUS | Status: AC
  Filled 2021-04-11: qty 20

## 2021-04-11 MED ORDER — ONDANSETRON HCL 4 MG/2ML IJ SOLN
INTRAMUSCULAR | Status: DC | PRN
  Administered 2021-04-11: 4 mg via INTRAVENOUS

## 2021-04-11 MED ORDER — CEFAZOLIN IN SODIUM CHLORIDE 3-0.9 GM/100ML-% IV SOLN
3.0000 g | INTRAVENOUS | Status: AC
  Administered 2021-04-11: 3 g via INTRAVENOUS
  Filled 2021-04-11: qty 100

## 2021-04-11 MED ORDER — MIDAZOLAM HCL 2 MG/2ML IJ SOLN
INTRAMUSCULAR | Status: AC
  Filled 2021-04-11: qty 2

## 2021-04-11 MED ORDER — PHENYLEPHRINE HCL-NACL 20-0.9 MG/250ML-% IV SOLN
INTRAVENOUS | Status: DC | PRN
  Administered 2021-04-11: 50 ug/min via INTRAVENOUS

## 2021-04-11 MED ORDER — MEPERIDINE HCL 25 MG/ML IJ SOLN: 6.2500 mg | INTRAMUSCULAR | Status: DC | PRN

## 2021-04-11 MED ORDER — ORAL CARE MOUTH RINSE: 15.0000 mL | Freq: Once | OROMUCOSAL | Status: AC

## 2021-04-11 MED ORDER — LIDOCAINE HCL (CARDIAC) PF 100 MG/5ML IV SOSY
PREFILLED_SYRINGE | INTRAVENOUS | Status: DC | PRN
  Administered 2021-04-11: 100 mg via INTRATRACHEAL

## 2021-04-11 MED ORDER — OXYCODONE HCL 5 MG/5ML PO SOLN: 5.0000 mg | Freq: Once | ORAL | Status: DC | PRN

## 2021-04-11 SURGICAL SUPPLY — 33 items
ALCOHOL 70% 16 OZ (MISCELLANEOUS) ×2 IMPLANT
BLADE SURG 15 STRL LF DISP TIS (BLADE) ×1 IMPLANT
BLADE SURG 15 STRL SS (BLADE) ×2
COVER SURGICAL LIGHT HANDLE (MISCELLANEOUS) ×2 IMPLANT
GAUZE 4X4 16PLY ~~LOC~~+RFID DBL (SPONGE) ×2 IMPLANT
GAUZE PACKING FOLDED 2  STR (GAUZE/BANDAGES/DRESSINGS) ×2
GAUZE PACKING FOLDED 2 STR (GAUZE/BANDAGES/DRESSINGS) ×1 IMPLANT
GLOVE SURG ENC MOIS LTX SZ6.5 (GLOVE) ×2 IMPLANT
GLOVE SURG POLYISO LF SZ6 (GLOVE) ×2 IMPLANT
GOWN STRL REUS W/ TWL LRG LVL3 (GOWN DISPOSABLE) ×2 IMPLANT
GOWN STRL REUS W/TWL LRG LVL3 (GOWN DISPOSABLE) ×4
KIT BASIN OR (CUSTOM PROCEDURE TRAY) ×2 IMPLANT
KIT TURNOVER KIT B (KITS) ×2 IMPLANT
MANIFOLD NEPTUNE II (INSTRUMENTS) ×2 IMPLANT
NEEDLE BLUNT 16X1.5 OR ONLY (NEEDLE) ×2 IMPLANT
NEEDLE DENTAL 27 LONG (NEEDLE) ×4 IMPLANT
NS IRRIG 1000ML POUR BTL (IV SOLUTION) ×2 IMPLANT
PACK EENT II TURBAN DRAPE (CUSTOM PROCEDURE TRAY) ×2 IMPLANT
PAD ARMBOARD 7.5X6 YLW CONV (MISCELLANEOUS) ×2 IMPLANT
SPONGE SURGIFOAM ABS GEL 100 (HEMOSTASIS) IMPLANT
SPONGE SURGIFOAM ABS GEL 12-7 (HEMOSTASIS) IMPLANT
SPONGE SURGIFOAM ABS GEL SZ50 (HEMOSTASIS) ×2 IMPLANT
SUCTION FRAZIER HANDLE 10FR (MISCELLANEOUS) ×2
SUCTION TUBE FRAZIER 10FR DISP (MISCELLANEOUS) ×1 IMPLANT
SUT CHROMIC 3 0 PS 2 (SUTURE) ×4 IMPLANT
SUT CHROMIC 4 0 P 3 18 (SUTURE) IMPLANT
SYR 50ML SLIP (SYRINGE) ×2 IMPLANT
SYR BULB IRRIG 60ML STRL (SYRINGE) ×2 IMPLANT
TOWEL GREEN STERILE FF (TOWEL DISPOSABLE) ×2 IMPLANT
TUBE CONNECTING 12X1/4 (SUCTIONS) ×2 IMPLANT
WATER STERILE IRR 1000ML POUR (IV SOLUTION) ×2 IMPLANT
WATER TABLETS ICX (MISCELLANEOUS) ×2 IMPLANT
YANKAUER SUCT BULB TIP NO VENT (SUCTIONS) ×2 IMPLANT

## 2021-04-11 NOTE — Transfer of Care (Signed)
Immediate Anesthesia Transfer of Care Note  Patient: David [name]  Procedure(s) Performed: MULTIPLE EXTRACTION WITH ALVEOLOPLASTY (Mouth)  Patient Location: PACU  Anesthesia Type:General  Level of Consciousness: awake, alert , oriented and patient cooperative  Airway & Oxygen Therapy: Patient Spontanous Breathing  Post-op Assessment: Report given to RN, Post -op Vital signs reviewed and stable and Patient moving all extremities X 4  Post vital signs: Reviewed and stable  Last Vitals:  Vitals Value Taken Time  BP 89/76 04/11/21 0842  Temp    Pulse 37 04/11/21 0843  Resp 16 04/11/21 0843  SpO2 92 % 04/11/21 0843  Vitals shown include unvalidated device data.  Last Pain:  Vitals:   04/11/21 0631  TempSrc:   PainSc: 0-No pain         Complications: No notable events documented.

## 2021-04-11 NOTE — Interval H&P Note (Signed)
History and Physical Interval Note:  04/11/2021 7:13 AM  David [name]  has presented today for surgery, with the diagnosis of dental caries and periodontal disease.  The various methods of treatment have been discussed with the patient and family. After consideration of risks, benefits and other options for treatment, the patient has consented to  Procedure(s): MULTIPLE EXTRACTION WITH ALVEOLOPLASTY (N/A) as a surgical intervention.  The patient's history has been reviewed, patient examined, no change in status, stable for surgery.  I have reviewed the patient's chart and labs.  Questions were answered to the patient's satisfaction.     Charlaine Dalton

## 2021-04-11 NOTE — Anesthesia Postprocedure Evaluation (Signed)
Anesthesia Post Note  Patient: David [name]  Procedure(s) Performed: MULTIPLE EXTRACTION WITH ALVEOLOPLASTY (Mouth)     Patient location during evaluation: PACU Anesthesia Type: General Level of consciousness: awake and alert, patient cooperative and oriented Pain management: pain level controlled Vital Signs Assessment: post-procedure vital signs reviewed and stable Respiratory status: spontaneous breathing, nonlabored ventilation and respiratory function stable Cardiovascular status: blood pressure returned to baseline and stable Postop Assessment: no apparent nausea or vomiting and able to ambulate Anesthetic complications: no   No notable events documented.  Last Vitals:  Vitals:   04/11/21 0855 04/11/21 0910  BP: 112/65 (!) 102/56  Pulse: 66 78  Resp: 14 20  Temp:  36.7 C  SpO2: 92% 95%    Last Pain:  Vitals:   04/11/21 0910  TempSrc:   PainSc: 0-No pain                 Petros Ahart,E. Jenasia Dolinar

## 2021-04-11 NOTE — Discharge Instructions (Signed)
[place] [facility] Kyrin Gratz B. Carle Dargan, D.M.D. Phone: [phone] Fax: [phone]    MOUTH CARE AFTER SURGERY   FACTS: Ice used in ice bag helps keep the swelling down, and can help lessen the pain. It is easier to treat pain BEFORE it happens. Spitting disturbs the clot and may cause bleeding to start again, or to get worse. Smoking delays healing and can cause complications. Sharing prescriptions can be dangerous.  Do not take medications not recently prescribed for you. Antibiotics may stop birth control pills from working.  Use other means of birth control while on antibiotics. Warm salt water rinses after the first 24 hours will help lessen the swelling:  Use 1/2 teaspoonful of table salt per oz.of water.  DO NOT: Do not spit.   Do not drink through a straw. Strongly advised not to smoke, dip snuff or chew tobacco at least for 3 days. Do not eat sharp or crunchy foods.  Avoid the area of surgery when chewing. Do not stop your antibiotics before your instructions say to do so. Do not eat hot foods until bleeding has stopped.  If you need to, let your food cool down to room temperature.  EXPECT: Some swelling, especially first 2-3 days. Soreness or discomfort in varying degrees.  Follow your dentist's instructions about how to handle pain before it starts. Pinkish saliva or light blood in saliva, or on your pillow in the morning.  This can last around 24 hours. Bruising inside or outside the mouth.  This may not show up until 2-3 days after surgery.  Don't worry, it will go away in time. Pieces of "bone" may work themselves loose.  It's OK.  If they bother you, let us know.    WHAT TO DO IMMEDIATELY AFTER SURGERY: Bite on gauze with steady pressure for 30-45 minutes at a time.  Switch out the gauze after 30-45 minutes for clean gauze, and continue this for 1-2 hours or until bleeding subsides. Do not chew on the gauze. Do not lie down flat.  Raise  your head support especially for the first 24 hours. Apply ice to your face on the side of the surgery.  You may apply it 20 minutes on and a few minutes off.  Ice for 8-12 hours.  You may use ice up to 24 hours. Before the numbness wears off, take a pain pill as instructed. Prescription pain medication is not always required.  SWELLING: Expect swelling for the first couple of days.  It should get better after that. If swelling increases 3 days or so after surgery, let us know as soon as possible.  FEVER: Take Tylenol every 4 hours if needed to lower your temperature, especially if it is at 100F or higher. Drink lots of fluids. If the fever does not go away, let us know.  BREATHING TROUBLE: Any unusual difficulty breathing means you have to have someone bring you to the emergency room ASAP.  BLEEDING: Light oozing is expected for 24 hours or so. Prop head up with pillows. Do not spit. Do not confuse bright red fresh flowing blood with lots of saliva colored with a little bit of blood. If you notice some bleeding, place gauze or a tea bag where it is bleeding and apply CONSTANT pressure by biting down for 1 hour.  Avoid talking during this time.  Do not remove the gauze or tea bag during this hour to "check" the bleeding. If you notice bright RED bleeding FLOWING out   of particular area, and filling the floor of your mouth, put a wad of gauze on that area, bite down firmly and constantly.  Call us immediately.  If we're closed, have someone bring you to the emergency room.  ORAL HYGIENE: Brush your teeth as usual after meals and before bedtime. Use a soft toothbrush around the area of surgery. DO NOT AVOID BRUSHING.  Otherwise bacteria(germs) will grow and may delay healing or encourage infection. Since you cannot spit, just gently rinse and let the water flow out of your mouth. DO NOT SWISH HARD.  EATING: Cool liquids are a good point to start.  Increase to soft foods as  tolerated.   PRESCRIPTIONS: Follow the directions for your prescriptions exactly as written. If your doctor gave you a narcotic pain medication, do not drive, operate machinery or drink alcohol when on that medication.   QUESTIONS? Call our office during office hours [phone] or call the Emergency Room at [phone].  

## 2021-04-11 NOTE — Op Note (Signed)
Department of Dental Medicine    OPERATIVE REPORT  DATE OF SURGERY:   04/11/2021  PATIENT'S NAME:   David [name] DATE OF BIRTH:   1956/04/21 MEDICAL RECORD NUMBER: [id number]  SURGEON:   Kensley Valladares B. Benson Norway, D.M.D.  ASSISTANT:  Molli Posey, DAII  PREOPERATIVE DIAGNOSES:  Dental caries, periodontal disease  Patient Active Problem List   Diagnosis Date Noted   Severe aortic stenosis    Arthritis of left knee 03/10/2021   Anxiety 02/24/2019   Family history of colon cancer in father 02/24/2019   Venous insufficiency 08/21/2017   Seborrheic dermatitis 05/14/2017   Nasal valve collapse 04/17/2017   Sensorineural hearing loss (SNHL), bilateral 04/17/2017   Tinnitus of right ear 03/21/2017   History of CVA (cerebrovascular accident) 06/22/2016   Aortic stenosis 10/05/2015   Allergic rhinitis 05/08/2015   Former smoker 05/08/2015   Morbid obesity due to excess calories ([place]) 01/12/2015   Symptomatic varicose veins of left lower extremity 07/06/2014   PAF (paroxysmal atrial fibrillation) ([place]) 03/18/2013   OSA treated with BiPAP 03/18/2013   RBBB 03/18/2013   HTN (hypertension) 03/18/2013   Aortic aneurysm ([place]) 03/18/2013    POSTOPERATIVE DIAGNOSES:  Dental caries, periodontal disease  PROCEDURES PERFORMED: Extractions of teeth numbers 2, 3, 14, 15 and 19  ANESTHESIA:  General anesthesia via endotracheal tube.  MEDICATIONS: Ancef 2 g IV prior to invasive dental procedures. Local anesthesia with a total utilization of 3 cartridges of 34 mg of lidocaine with 0.018 mg of epinephrine/ea.  SPECIMENS:  5 teeth that were extracted and discarded  DRAINS/CULTURES:  None  COMPLICATIONS:  None  ESTIMATED BLOOD LOSS:  5 mL  INTRAVENOUS FLUIDS:  10 mL of Lactated ringers solution  INDICATIONS:  The patient was recently diagnosed with severe aortic stenosis.  A medically necessary dental consult was then requested to evaluate the patient for any dental/orofacial infection  and their overall oral health.  The patient was examined and subsequently treatment planned for multiple extractions of chronically infected, severely decayed and periodontally compromised teeth.  This treatment plan was made to decrease the perioperative and postoperative risks and complications associated with dental/orofacial infection from affecting the patient's systemic health.  OPERATIVE FINDINGS:  The patient was examined in operating room number 7.  The indicated teeth were identified and verified for extraction. The patient was noted be affected by severe dental decay, chronic periodontitis and retained dental root tips.  DESCRIPTION OF PROCEDURE:  The patient was identified in the holding area and brought to the main operating room number 7 by the anesthesia team. The patient was then placed in the supine position on the operating table.  General anesthesia was then induced per the anesthesia team. The patient was then prepped and draped in the usual sterile fashion for dental medicine procedures.  A timeout was performed. The patient was identified and procedures were verified. A throat pack was placed at this time. The oral cavity was then thoroughly examined with the findings noted above. The patient was then ready for the dental medicine procedure as follows:   ANESTHESIA: Local anesthesia was administered sequentially with a total utilization of 3 cartridges each containing 34 mg of lidocaine with 0.018 mg of epinephrine/ea.  Location of anesthesia included upper right infiltration and palatal, upper left infiltration and palatal, lower left long buccal, infiltration and lingual. Aspiration negative.  ROUTINE EXTRACTIONS: The maxillary left and right quadrants were first approached. The teeth were then subluxated with a series of straight elevators.  Teeth  numbers 2, 3, 14 and 15 were then removed with a 150 forceps and rongeurs without complications.  Alveoloplasty was then performed  utilizing a ronguers and bone file.  The tissues were approximated and trimmed appropriately to help achieve primary closure.  The surgical sites were then irrigated with copious amounts of sterile saline.  Surgi Foam was placed in each extraction site.   The surgical sites were closed using 3-0 chromic gut sutures as follows: #2 and #3 extraction sites 2 simple interrupted.  The mandibular left quadrant was then approached. The teeth were subluxated with a series of straight elevators.  Tooth #19 was then removed utilizing a #23 cowhorn without complications.  Alveoloplasty was then performed utilizing a rongeurs and bone file. The tissues were approximated and trimmed appropriately to help achieve primary closure. The surgical sites were then irrigated with copious amounts of sterile saline.  Surgi Foam was placed in the extraction site.  The surgical sites were closed using 3-0 chromic gut sutures as follows: 1 simple interrupted.   END OF PROCEDURE: Thorough oral irrigation with sterile saline was performed.  Good hemostasis was observed.  The patient was examined for complications, and seeing none, the dental medicine procedure was deemed to be complete.  The throat pack was removed at this time. An oral airway was then placed at the request of the anesthesia team.  A series of 4x4 gauze were placed in the mouth to aid hemostasis as needed.  The patient was then handed over to the anesthesia team for final disposition.  After an appropriate amount of time, the patient was extubated and taken to the postanesthsia care unit in stable condition.  All counts were correct for the dental medicine procedure.     [place] Benson Norway, D.M.D.

## 2021-04-11 NOTE — Anesthesia Procedure Notes (Signed)
Procedure Name: Intubation Date/Time: 04/11/2021 7:39 AM Performed by: Claris Che, CRNA Pre-anesthesia Checklist: Patient identified, Emergency Drugs available, Suction available, Patient being monitored and Timeout performed Patient Re-evaluated:Patient Re-evaluated prior to induction Oxygen Delivery Method: Circle system utilized Preoxygenation: Pre-oxygenation with 100% oxygen Induction Type: IV induction and Cricoid Pressure applied Ventilation: Mask ventilation without difficulty Grade View: Grade III Tube type: Oral Tube size: 7.5 mm Number of attempts: 1 Placement Confirmation: ETT inserted through vocal cords under direct vision, positive ETCO2 and breath sounds checked- equal and bilateral Secured at: 24 cm Tube secured with: Tape Dental Injury: Teeth and Oropharynx as per pre-operative assessment

## 2021-04-11 NOTE — Addendum Note (Signed)
Addendum  created 04/11/21 0920 by Josephine Igo, CRNA   Charge Capture section accepted

## 2021-04-12 ENCOUNTER — Other Ambulatory Visit: Payer: Self-pay

## 2021-04-12 ENCOUNTER — Ambulatory Visit: Payer: 59 | Attending: Cardiovascular Disease | Admitting: Physical Therapy

## 2021-04-12 ENCOUNTER — Encounter (HOSPITAL_COMMUNITY): Payer: Self-pay | Admitting: Dentistry

## 2021-04-12 ENCOUNTER — Institutional Professional Consult (permissible substitution) (INDEPENDENT_AMBULATORY_CARE_PROVIDER_SITE_OTHER): Payer: 59 | Admitting: Surgery

## 2021-04-12 VITALS — BP 120/63 | HR 81 | Resp 20 | Ht 75.0 in | Wt >= 6400 oz

## 2021-04-12 DIAGNOSIS — I350 Nonrheumatic aortic (valve) stenosis: Secondary | ICD-10-CM | POA: Diagnosis not present

## 2021-04-12 DIAGNOSIS — R2689 Other abnormalities of gait and mobility: Secondary | ICD-10-CM

## 2021-04-12 NOTE — Therapy (Signed)
[place], [place], [address] Phone: [phone]   Fax:  [phone]  Physical Therapy Evaluation  Patient Details  Name: David [name] MRN: [id number] Date of Birth: March 24, 1956 Referring Provider (PT): Burnell Blanks, MD   Encounter Date: 04/12/2021   PT End of Session - 04/12/21 1150     Visit Number 1    Number of Visits 1    Date for PT Re-Evaluation 04/12/21    PT Start Time 1150    PT Stop Time 1220    PT Time Calculation (min) 30 min    Activity Tolerance Patient tolerated treatment well    Behavior During Therapy [facility] for tasks assessed/performed             Past Medical History:  Diagnosis Date   Allergy    RHINITIS   Arthritis    thumb   Complication of anesthesia    2012 shoulder surgery: patient unsure of what the problem was    GERD (gastroesophageal reflux disease)    "not really"   History of echocardiogram 03/29/2009   EF 50-55%; mild concentric LVH;    History of nuclear stress test 08/28/2007   moderate diaphragmatic attenuation; EKG neg for ischemia    Hx of adenomatous colonic polyps    Hyperlipidemia    Hypertension    takes Diovan and Toprol   Obesity    PAF (paroxysmal atrial fibrillation) (HCC)    associated with OSA   Right bundle branch block    Rosacea    Shortness of breath    With activity   Sleep apnea    CPAP   Smoker    Systolic murmur     Past Surgical History:  Procedure Laterality Date   COLONOSCOPY WITH PROPOFOL N/A 11/25/2020   Procedure: COLONOSCOPY WITH PROPOFOL;  Surgeon: Carol Ada, MD;  Location: [facility];  Service: Endoscopy;  Laterality: N/A;   FINGER SURGERY     Reconstruction of Left thumb and L index finger.  Reconstruction    HEMOSTASIS CLIP PLACEMENT  11/25/2020   Procedure: HEMOSTASIS CLIP PLACEMENT;  Surgeon: Carol Ada, MD;  Location: [facility];  Service: Endoscopy;;   KNEE ARTHROSCOPY     MULTIPLE  EXTRACTIONS WITH ALVEOLOPLASTY N/A 04/11/2021   Procedure: MULTIPLE EXTRACTION WITH ALVEOLOPLASTY;  Surgeon: Charlaine Dalton, DMD;  Location: [facility];  Service: Dentistry;  Laterality: N/A;   POLYPECTOMY  11/25/2020   Procedure: POLYPECTOMY;  Surgeon: Carol Ada, MD;  [facility];  Service: Endoscopy;;   RIGHT/LEFT HEART CATH AND CORONARY ANGIOGRAPHY N/A 03/23/2021   Procedure: RIGHT/LEFT HEART CATH AND CORONARY ANGIOGRAPHY;  Surgeon: Burnell Blanks, MD;  Location: [facility];  Service: Cardiovascular;  Laterality: N/A;   SHOULDER CLOSED REDUCTION     Procedure: CLOSED REDUCTION SHOULDER;  Surgeon: Lawrence Santiago [facility];  Location: [place];  Service: Orthopedics;  Laterality: Right;   SHOULDER OPEN ROTATOR CUFF REPAIR  08/11/2011   Procedure: ROTATOR CUFF REPAIR SHOULDER OPEN;  Surgeon: Willa Frater III;  Location: [facility];  Service: Orthopedics;  Laterality: Right;   SUBMUCOSAL LIFTING INJECTION  11/25/2020   Procedure: SUBMUCOSAL LIFTING INJECTION;  Surgeon: Carol Ada, MD;  [facility];  Service: Endoscopy;;   SUBMUCOSAL TATTOO INJECTION  11/25/2020   Procedure: SUBMUCOSAL TATTOO INJECTION;  Surgeon: Carol Ada, MD;  [facility];  Service: Endoscopy;;   TONSILLECTOMY     as a child    There were no vitals  filed for this visit.    Subjective Assessment - 04/12/21 1153     Subjective pt is a 65 y.o with CC of progressive SOB that has progressively worsened in the last 12 months and has noted increased incidence of a "feeling" in heart but not quite a pain starting the last 3 months. He reports having L knee pain that impacts    Patient Stated Goals to fix heart    Currently in Pain? Yes    Pain Score 0-No pain   with standing/ walking at worst 8/10   Pain Location Knee    Pain Orientation Left                [facility] PT Assessment - 04/12/21 0001       Assessment   Medical Diagnosis Severe aortice stenosis    Referring Provider  (PT) Burnell Blanks, MD    Hand Dominance Right      Precautions   Precautions None      Restrictions   Weight Bearing Restrictions No      Balance Screen   Has the patient fallen in the past 6 months No      [place] residence    [place] to enter    Entrance Stairs-Number of Steps 6    Entrance Stairs-Rails Can reach both    Home Layout Two level    Alternate Level Stairs-Number of Steps [place] - single point      Prior Function   Level of Independence Independent      Posture/Postural Control   Posture/Postural Control Postural limitations    Postural Limitations Rounded Shoulders;Forward head      ROM / Strength   AROM / PROM / Strength AROM;Strength      AROM   Overall AROM Comments limited R shoulder motion in all planes and LUE WFL      Strength   Overall Strength Within functional limits for tasks performed   in available ROM for the R shoulder   Overall Strength Comments RLE strength WFL, gross weakness in the LLE    Strength Assessment Site Hand    Right Hand Grip (lbs) 82    Left Hand Grip (lbs) 60      Ambulation/Gait   Gait Pattern Decreased stride length;Decreased stance time - left;Decreased step length - right;Antalgic              [facility] Pre-Surgical Assessment - 04/12/21 0001     5 Meter Walk Test- trial 1 10 sec    5 Meter Walk Test- trial 2 9 sec.     5 Meter Walk Test- trial 3 9 sec.    5 meter walk test average 9.33 sec    4 Stage Balance Test tolerated for:  8 sec.    4 Stage Balance Test Position 2    comment unable to perform tandem stance due to soft tissue approximation    Comment unable to perfomr    ADL/IADL Independent with: Bathing;Dressing;Meal prep;Finances    ADL/IADL Needs Assistance with: [place] Leaver work    ADL/IADL Fraility Index Midly frail    6 Minute Walk- Baseline yes    BP (mmHg) 124/69    HR (bpm) 80    02 Sat  (%RA) 96 %    Modified Borg Scale for Dyspnea 2- Mild shortness of breath    Perceived Rate of Exertion (  Borg) 6-    6 Minute Walk Post Test yes    BP (mmHg) 148/83    HR (bpm) 110    02 Sat (%RA) 92 %    Modified Borg Scale for Dyspnea 6-    Perceived Rate of Exertion (Borg) 17- Very hard    Aerobic Endurance Distance Walked 555    Endurance additional comments pt is 70.42% limited compared to age related norm                      Objective measurements completed on examination: See above findings.                     Clinical Impression Statement: Pt is a 65 yo Y.O M presenting to OP PT for evaluation prior to possible TAVR surgery due to severe aortic stenosis. Pt reports onset of SOB and general fatigue approximately 12 months ago, and odd chest sensation starting 3 months ago. Symptoms are limiting prolonged walking/ standing. Pt presents with limited R shoulder and LLE ROM and functional UE strength in available ROM and gross LLE weakness, fair balance and is assessed as moderate at high fall risk 4 stage balance test, fair walking speed and limited aerobic endurance per 6 minute walk test. Pt ambulated 555 feet in without requesting a seated rest beak lasting. At end of test, patient's HR was 110 bpm and O2 was 92% on room air. Pt reported 6/10 shortness of breath on modified scale for dyspnea. Pt ambulated a total of 555 feet in 6 minute walk. SOB increased significantly with 6 minute walk test. Based on the Short Physical Performance Battery, patient has a frailty rating of 3/12 with </= 5/12 considered frail.         Plan - 04/12/21 1225     Clinical Impression Statement See assessment in note    Stability/Clinical Decision Making Stable/Uncomplicated    Clinical Decision Making Low    PT Frequency One time visit    PT Next Visit Plan Pre-TAVR evaluation    Consulted and Agree with Plan of Care Patient             Patient demonstrated the  following deficits and impairments:     Visit Diagnosis: Other abnormalities of gait and mobility     Problem List Patient Active Problem List   Diagnosis Date Noted   [place]    Chronic periodontitis    Severe aortic stenosis    Arthritis of left knee 03/10/2021   Anxiety 02/24/2019   Family history of colon cancer in father 02/24/2019   Venous insufficiency 08/21/2017   Seborrheic dermatitis 05/14/2017   Nasal valve collapse 04/17/2017   Sensorineural hearing loss (SNHL), bilateral 04/17/2017   Tinnitus of right ear 03/21/2017   History of CVA (cerebrovascular accident) 06/22/2016   Aortic stenosis 10/05/2015   Allergic rhinitis 05/08/2015   Former smoker 05/08/2015   Morbid obesity due to excess calories ([place]) 01/12/2015   Symptomatic varicose veins of left lower extremity 07/06/2014   PAF (paroxysmal atrial fibrillation) ([place]) 03/18/2013   OSA treated with BiPAP 03/18/2013   RBBB 03/18/2013   HTN (hypertension) 03/18/2013   Aortic aneurysm ([place]) 03/18/2013   [place] PT, DPT, LAT, ATC  04/12/21  12:36 PM      [facility] [address] [place], [place], [address] Phone: [phone]   Fax:  [phone]  Name: David [name] MRN: [id number] Date of Birth: Jun 08, 1956

## 2021-04-12 NOTE — Progress Notes (Signed)
Patient ID: David [name], male   DOB: August 29, 1955, 65 y.o.   MRN: [id number]  [facility]         [address]       [place] [address]             [phone]          CARDIOTHORACIC SURGERY CONSULTATION REPORT  PCP is Denita Lung, MD Referring Provider is Lauree Chandler, MD Primary Cardiologist is [name], MD  Reason for consultation:  Severe aortic stenosis  HPI:  The patient is a 65 year old gentleman with a history of hypertension, hyperlipidemia, super morbid obesity, paroxysmal atrial fibrillation, OSA, previous smoking, and severe aortic stenosis who was referred for consideration of aortic valve replacement.  He has been maintained on Xarelto for his paroxysmal atrial fibrillation.  Dr. Debara Pickett has been following his aortic stenosis with periodic echocardiograms.  His most recent echo on 03/02/2021 shows a trileaflet aortic valve with severe calcification and thickening and restricted leaflet mobility.  The mean gradient was 49 mmHg with a valve area by VTI of 0.67 cm consistent with severe aortic stenosis.  Left ventricular ejection fraction was 60 to 65%.  There was no mitral regurgitation.  He reports having progressive shortness of breath over the past 6 months to a year.  He has occasional chest pressure.  He denies dizziness or syncope.  He has had chronic lower extremity edema which seems to be worse over the past year.  He is here today by himself.  He is single and lives in [place].  He works as a Educational psychologist at [name].  Past Medical History:  Diagnosis Date   Allergy    RHINITIS   Arthritis    thumb   Complication of anesthesia    2012 shoulder surgery: patient unsure of what the problem was    GERD (gastroesophageal reflux disease)    "not really"   History of echocardiogram 03/29/2009   EF 50-55%; mild concentric LVH;    History of nuclear stress test  08/28/2007   moderate diaphragmatic attenuation; EKG neg for ischemia    Hx of adenomatous colonic polyps    Hyperlipidemia    Hypertension    takes Diovan and Toprol   Obesity    PAF (paroxysmal atrial fibrillation) (HCC)    associated with OSA   Right bundle branch block    Rosacea    Shortness of breath    With activity   Sleep apnea    CPAP   Smoker    Systolic murmur     Past Surgical History:  Procedure Laterality Date   COLONOSCOPY WITH PROPOFOL N/A 11/25/2020   Procedure: COLONOSCOPY WITH PROPOFOL;  Surgeon: Carol Ada, MD;  Location: [facility];  Service: Endoscopy;  Laterality: N/A;   FINGER SURGERY     Reconstruction of Left thumb and L index finger.  Reconstruction    HEMOSTASIS CLIP PLACEMENT  11/25/2020   Procedure: HEMOSTASIS CLIP PLACEMENT;  Surgeon: Carol Ada, MD;  Location: [facility];  Service: Endoscopy;;   KNEE ARTHROSCOPY     MULTIPLE EXTRACTIONS WITH ALVEOLOPLASTY N/A 04/11/2021   Procedure: MULTIPLE EXTRACTION WITH ALVEOLOPLASTY;  Surgeon: Charlaine Dalton, DMD;  Location: [facility];  Service: Dentistry;  Laterality: N/A;   POLYPECTOMY  11/25/2020   Procedure: POLYPECTOMY;  Surgeon: Carol Ada, MD;  Location: [facility];  Service: Endoscopy;;   RIGHT/LEFT HEART CATH AND CORONARY ANGIOGRAPHY N/A  03/23/2021   Procedure: RIGHT/LEFT HEART CATH AND CORONARY ANGIOGRAPHY;  Surgeon: Burnell Blanks, MD;  Location: [facility];  Service: Cardiovascular;  Laterality: N/A;   SHOULDER CLOSED REDUCTION     Procedure: CLOSED REDUCTION SHOULDER;  Surgeon: Lawrence Santiago [facility];  Location: [facility];  Service: Orthopedics;  Laterality: Right;   SHOULDER OPEN ROTATOR CUFF REPAIR  08/11/2011   Procedure: ROTATOR CUFF REPAIR SHOULDER OPEN;  Surgeon: Willa Frater III;  Location: [facility];  Service: Orthopedics;  Laterality: Right;   SUBMUCOSAL LIFTING INJECTION  11/25/2020   Procedure: SUBMUCOSAL LIFTING INJECTION;  Surgeon: Carol Ada, MD;  Location: [facility];  Service: Endoscopy;;   SUBMUCOSAL TATTOO INJECTION  11/25/2020   Procedure: SUBMUCOSAL TATTOO INJECTION;  Surgeon: Carol Ada, MD;  Location: [facility];  Service: Endoscopy;;   TONSILLECTOMY     as a child    Family History  Problem Relation Age of Onset   Heart disease Mother    Cancer - Prostate Maternal Grandfather    Cancer - Prostate Paternal Grandfather    Cancer - Colon Father    Heart attack Father 53   Cancer Paternal Uncle    Anesthesia problems Neg Hx    Stroke Neg Hx     Social History   Socioeconomic History   Marital status: Single    Spouse name: Not on file   Number of children: Not on file   Years of education: Not on file   Highest education level: Not on file  Occupational History   Not on file  Tobacco Use   Smoking status: Former    Packs/day: 1.00    Years: 40.00    Pack years: 40.00    Types: Cigarettes    Quit date: 06/20/2016    Years since quitting: 4.8   Smokeless tobacco: Never  Vaping Use   Vaping Use: Never used  Substance and Sexual Activity   Alcohol use: Yes    Alcohol/week: 10.0 standard drinks    Types: 2 Glasses of wine, 6 Cans of beer, 2 Shots of liquor per week   Drug use: No   Sexual activity: Not Currently  Other Topics Concern   Not on file  Social History Narrative   Not on file   Social Determinants of Health   Financial Resource Strain: Not on file  Food Insecurity: Not on file  Transportation Needs: Not on file  Physical Activity: Not on file  Stress: Not on file  Social Connections: Not on file  Intimate Partner Violence: Not on file    Prior to Admission medications   Medication Sig Start Date End Date Taking? Authorizing Provider  ALPRAZolam Duanne Moron) 1 MG tablet Take 1 tablet (1 mg total) by mouth at bedtime as needed for sleep. 12/22/20  Yes Denita Lung, MD  atorvastatin (LIPITOR) 10 MG tablet TAKE 1 TABLET BY MOUTH EVERY DAY AT 6 PM 04/26/20  Yes Denita Lung, MD  diltiazem (CARDIZEM  CD) 120 MG 24 hr capsule Take 1 capsule (120 mg total) by mouth daily. 04/26/20  Yes Denita Lung, MD  furosemide (LASIX) 40 MG tablet Take 1 tablet (40 mg total) by mouth daily. 03/16/21  Yes Burnell Blanks, MD  HYDROcodone-acetaminophen (NORCO) 5-325 MG tablet Take 1 tablet by mouth every 6 (six) hours as needed for up to 3 days for moderate pain or severe pain. 04/11/21 04/14/21 Yes Owsley, Madison B, DMD  HYDROcodone-acetaminophen (NORCO/VICODIN) 5-325 MG tablet Take 1 tablet by  mouth every 6 (six) hours as needed for moderate pain. 03/10/21  Yes Denita Lung, MD  hydrocortisone 2.5 % cream Apply to external ears as needed for itching. 04/26/20  Yes Denita Lung, MD  methocarbamol (ROBAXIN) 500 MG tablet Take 1 tablet (500 mg total) by mouth 4 (four) times daily as needed for muscle spasms. 12/22/20  Yes Denita Lung, MD  metoprolol succinate (TOPROL-XL) 50 MG 24 hr tablet Take with or immediately following a meal. 04/26/20  Yes Denita Lung, MD  naproxen sodium (ALEVE) 220 MG tablet Take 110-220 mg by mouth 2 (two) times daily as needed (pain.).   Yes [provider]  potassium chloride SA (KLOR-CON) 20 MEQ tablet Take 1 tablet (20 mEq total) by mouth daily. 03/16/21  Yes Burnell Blanks, MD  rivaroxaban (XARELTO) 20 MG TABS tablet TAKE 1 TABLET(20 MG) BY MOUTH DAILY WITH SUPPER 04/26/20  Yes Denita Lung, MD  valsartan-hydrochlorothiazide (DIOVAN-HCT) 160-25 MG tablet Take 0.5 tablets by mouth daily. Take half tab daily for the next 3 days and if blood pressure sustains above 120 can start taking 1 tablet daily after that 05/23/20  Yes Domenic Polite, MD    Current Outpatient Medications  Medication Sig Dispense Refill   ALPRAZolam (XANAX) 1 MG tablet Take 1 tablet (1 mg total) by mouth at bedtime as needed for sleep. 30 tablet 0   atorvastatin (LIPITOR) 10 MG tablet TAKE 1 TABLET BY MOUTH EVERY DAY AT 6 PM 90 tablet 3   diltiazem (CARDIZEM CD) 120 MG 24 hr capsule  Take 1 capsule (120 mg total) by mouth daily. 90 capsule 3   furosemide (LASIX) 40 MG tablet Take 1 tablet (40 mg total) by mouth daily. 90 tablet 3   HYDROcodone-acetaminophen (NORCO) 5-325 MG tablet Take 1 tablet by mouth every 6 (six) hours as needed for up to 3 days for moderate pain or severe pain. 12 tablet 0   HYDROcodone-acetaminophen (NORCO/VICODIN) 5-325 MG tablet Take 1 tablet by mouth every 6 (six) hours as needed for moderate pain. 30 tablet 0   hydrocortisone 2.5 % cream Apply to external ears as needed for itching. 30 g 5   methocarbamol (ROBAXIN) 500 MG tablet Take 1 tablet (500 mg total) by mouth 4 (four) times daily as needed for muscle spasms. 30 tablet 0   metoprolol succinate (TOPROL-XL) 50 MG 24 hr tablet Take with or immediately following a meal. 90 tablet 3   naproxen sodium (ALEVE) 220 MG tablet Take 110-220 mg by mouth 2 (two) times daily as needed (pain.).     potassium chloride SA (KLOR-CON) 20 MEQ tablet Take 1 tablet (20 mEq total) by mouth daily. 90 tablet 3   rivaroxaban (XARELTO) 20 MG TABS tablet TAKE 1 TABLET(20 MG) BY MOUTH DAILY WITH SUPPER 90 tablet 3   valsartan-hydrochlorothiazide (DIOVAN-HCT) 160-25 MG tablet Take 0.5 tablets by mouth daily. Take half tab daily for the next 3 days and if blood pressure sustains above 120 can start taking 1 tablet daily after that     No current facility-administered medications for this visit.    Allergies  Allergen Reactions   Codeine Shortness Of Breath and Swelling   Morphine And Related Shortness Of Breath and Swelling      Review of Systems:   General:  normal appetite, + decreased energy, no weight gain, no weight loss, no fever  Cardiac:  no chest pain with exertion, no chest pain at rest, +SOB with mild exertion, no  resting SOB, no PND, no orthopnea, no palpitations, + arrhythmia, + atrial fibrillation, + LE edema, no dizzy spells, no syncope  Respiratory:  + exertional shortness of breath, no home oxygen,  no productive cough, no dry cough, no bronchitis, no wheezing, no hemoptysis, no asthma, no pain with inspiration or cough, + sleep apnea, + CPAP at night  GI:   no difficulty swallowing, no reflux, no frequent heartburn, no hiatal hernia, no abdominal pain, + constipation, no diarrhea, no hematochezia, no hematemesis, no melena  GU:   no dysuria,  no frequency, no urinary tract infection, no hematuria, no enlarged prostate, no kidney stones, no kidney disease  Vascular:  no pain suggestive of claudication, no pain in feet, no leg cramps, no varicose veins, no DVT, no non-healing foot ulcer  Neuro:   + stroke 2017 without residual, no TIA's, no seizures, no headaches, no temporary blindness one eye,  no slurred speech, no peripheral neuropathy, no chronic pain, no instability of gait, no memory/cognitive dysfunction  Musculoskeletal: + arthritis - primarily involving the left knee, + joint swelling, + myalgias,  +difficulty walking, + decreased mobility   Skin:   + rash, + itching, no skin infections, no pressure sores or ulcerations  Psych:   no anxiety, no depression, no nervousness, no unusual recent stress  Eyes:   no blurry vision, no floaters, no recent vision changes, no glasses or contacts  ENT:   no hearing loss, + loose or painful teeth, no dentures, last saw dentist yesterday for dental extraction of 5 teeth ( Dr. Benson Norway)  Hematologic:  no easy bruising, no abnormal bleeding, no clotting disorder, no frequent epistaxis  Endocrine:  no diabetes, does not check CBG's at home     Physical Exam:   BP 120/63 (BP Location: Left Arm, Patient Position: Sitting)   Pulse 81   Resp 20   Ht '6\' 3"'$  (1.905 m)   Wt (!) 425 lb (192.8 kg)   SpO2 93% Comment: RA  BMI 53.12 kg/m   General:  Obese,  well-appearing  HEENT:  Unremarkable, NCAT, PERLA, EOMI  Neck:   no JVD, no bruits, no adenopathy   Chest:   clear to auscultation, symmetrical breath sounds, no wheezes, no rhonchi   CV:   RRR, 3/6  systolic murmur RSB  Abdomen:  soft, non-tender, large panniculus  Extremities:  warm, well-perfused, pulses not palpable, moderate lower extremity edema  Rectal/GU  Deferred  Neuro:   Grossly non-focal and symmetrical throughout  Skin:   Clean and dry, no rashes, no breakdown  Diagnostic Tests:    ECHOCARDIOGRAM REPORT         Patient Name:   David [name]  Date of Exam: 03/02/2021  Medical Rec #:  [id number]     Height:       75.0 in  Accession #:    [id number]    Weight:       442.4 lb  Date of Birth:  Jan 07, 1956      BSA:          3.075 m  Patient Age:    51 years      BP:           158/86 mmHg  Patient Gender: M             HR:           83 bpm.  Exam Location:  [address]   Procedure: 2D Echo, Cardiac Doppler, Color Doppler and Intracardiac  Opacification Agent   Indications:    I35.0 Aortic stenosis     History:        Patient has prior history of Echocardiogram examinations,  most                  recent 08/22/2020. Arrythmias:RBBB, Signs/Symptoms:Murmur;  Risk                  Factors:Hypertension, HLD and Sleep Apnea.     Sonographer:    Marygrace Drought RCS  Referring Phys: [place]     1. Left ventricular ejection fraction, by estimation, is 60 to 65%. The  left ventricle has normal function. The left ventricle has no regional  wall motion abnormalities. There is moderate concentric left ventricular  hypertrophy. Left ventricular  diastolic function could not be evaluated. Elevated left ventricular  end-diastolic pressure.   2. Right ventricular systolic function is normal. The right ventricular  size is normal.   3. The mitral valve is normal in structure. No evidence of mitral valve  regurgitation. No evidence of mitral stenosis.   4. The aortic valve is tricuspid. There is severe calcifcation of the  aortic valve. There is severe thickening of the aortic valve. Aortic valve  regurgitation is not visualized. Severe aortic  valve stenosis. Aortic  valve area, by VTI measures 0.67 cm.  Aortic valve mean gradient measures 49.0 mmHg. Aortic valve Vmax measures  4.53 m/s.   5. Aortic dilatation noted. There is borderline dilatation of the  ascending aorta, measuring 38 mm.   6. The inferior vena cava is normal in size with greater than 50%  respiratory variability, suggesting right atrial pressure of 3 mmHg.   FINDINGS   Left Ventricle: Left ventricular ejection fraction, by estimation, is 60  to 65%. The left ventricle has normal function. The left ventricle has no  regional wall motion abnormalities. The left ventricular internal cavity  size was normal in size. There is   moderate concentric left ventricular hypertrophy. Left ventricular  diastolic function could not be evaluated due to atrial fibrillation. Left  ventricular diastolic function could not be evaluated. Elevated left  ventricular end-diastolic pressure.   Right Ventricle: The right ventricular size is normal. No increase in  right ventricular wall thickness. Right ventricular systolic function is  normal.   Left Atrium: Left atrial size was normal in size.   Right Atrium: Right atrial size was normal in size.   Pericardium: There is no evidence of pericardial effusion.   Mitral Valve: The mitral valve is normal in structure. Mild mitral annular  calcification. No evidence of mitral valve regurgitation. No evidence of  mitral valve stenosis.   Tricuspid Valve: The tricuspid valve is normal in structure. Tricuspid  valve regurgitation is not demonstrated. No evidence of tricuspid  stenosis.   Aortic Valve: The aortic valve is tricuspid. There is severe calcifcation  of the aortic valve. There is severe thickening of the aortic valve.  Aortic valve regurgitation is not visualized. Severe aortic stenosis is  present. Aortic valve mean gradient  measures 49.0 mmHg. Aortic valve peak gradient measures 82.1 mmHg. Aortic  valve area, by  VTI measures 0.67 cm.   Pulmonic Valve: The pulmonic valve was normal in structure. Pulmonic valve  regurgitation is not visualized. No evidence of pulmonic stenosis.   Aorta: Aortic dilatation noted. There is borderline dilatation of the  ascending aorta, measuring 38 mm.   Venous: The inferior vena cava is  normal in size with greater than 50%  respiratory variability, suggesting right atrial pressure of 3 mmHg.   IAS/Shunts: No atrial level shunt detected by color flow Doppler.      LEFT VENTRICLE  PLAX 2D  LVIDd:         4.90 cm  Diastology  LVIDs:         3.50 cm  LV e' medial:    10.60 cm/s  LV PW:         1.55 cm  LV E/e' medial:  13.8  LV IVS:        1.45 cm  LV e' lateral:   11.70 cm/s  LVOT diam:     2.20 cm  LV E/e' lateral: 12.5  LV SV:         77  LV SV Index:   25  LVOT Area:     3.80 cm      RIGHT VENTRICLE  RV Basal diam:  3.80 cm  RV S prime:     13.70 cm/s  TAPSE (M-mode): 1.6 cm   LEFT ATRIUM              Index       RIGHT ATRIUM           Index  LA diam:        4.80 cm  1.56 cm/m  RA Area:     25.50 cm  LA Vol (A2C):   116.0 ml 37.72 ml/m RA Volume:   86.30 ml  28.06 ml/m  LA Vol (A4C):   148.0 ml 48.13 ml/m  LA Biplane Vol: 133.0 ml 43.25 ml/m   AORTIC VALVE  AV Area (Vmax):    0.70 cm  AV Area (Vmean):   0.71 cm  AV Area (VTI):     0.67 cm  AV Vmax:           453.00 cm/s  AV Vmean:          331.000 cm/s  AV VTI:            1.160 m  AV Peak Grad:      82.1 mmHg  AV Mean Grad:      49.0 mmHg  LVOT Vmax:         82.90 cm/s  LVOT Vmean:        62.100 cm/s  LVOT VTI:          0.203 m  LVOT/AV VTI ratio: 0.18     AORTA  Ao Root diam: 3.60 cm  Ao Asc diam:  3.80 cm   MITRAL VALVE  MV Area (PHT):              SHUNTS  MV Decel Time:              Systemic VTI:  0.20 m  MV E velocity: 146.00 cm/s  Systemic Diam: 2.20 cm   Fransico Him MD  Electronically signed by Fransico Him MD  Signature Date/Time: 03/03/2021/11:21:50 AM          Final    Physicians  Panel Physicians Referring Physician Case Authorizing Physician  Burnell Blanks, MD (Primary)     Procedures  RIGHT/LEFT HEART CATH AND CORONARY ANGIOGRAPHY   Conclusion  No angiographic evidence of CAD Severe aortic stenosis (mean gradient 41.3 mmHg, peak to peak gradient 62 mmHg)   Will continue workup for TAVR.    Indications  Severe aortic stenosis [I35.0 (ICD-10-CM)]   Procedural Details  Technical Details  Indication: 65 yo male with history of PAF, morbid obesity and severe AS. Workup for TAVR  Procedure: The risks, benefits, complications, treatment options, and expected outcomes were discussed with the patient. The patient and/or family concurred with the proposed plan, giving informed consent. The patient was brought to the cath lab after IV hydration was given. The patient was sedated with Versed and Fentanyl. The IV catheter in the right antecubital vein was changed for a 5 [place] sheath. Right heart catheterization performed with a balloon tipped catheter. The right wrist was prepped and draped in a sterile fashion. 1% lidocaine was used for local anesthesia. Using the modified Seldinger access technique, a 5 French sheath was placed in the right radial artery. 3 mg Verapamil was given through the sheath. 8000 units IV heparin was given. Standard diagnostic catheters were used to perform selective coronary angiography. The aortic valve was crossed with the JR4 catheter. LV pressures measured. No LV gram. The sheath was removed from the right radial artery and a Terumo hemostasis band was applied at the arteriotomy site on the right wrist.      Estimated blood loss <50 mL.   During this procedure medications were administered to achieve and maintain moderate conscious sedation while the patient's heart rate, blood pressure, and oxygen saturation were continuously monitored and I was present face-to-face 100% of this time.    Medications (Filter: Administrations occurring from [id number] to 1321 on 03/23/21)  important  Continuous medications are totaled by the amount administered until 03/23/21 1321.   fentaNYL (SUBLIMAZE) injection (mcg) Total dose:  25 mcg Date/Time Rate/Dose/Volume Action   03/23/21 1237 25 mcg Given    midazolam (VERSED) injection (mg) Total dose:  1 mg Date/Time Rate/Dose/Volume Action   03/23/21 1238 1 mg Given    lidocaine (PF) (XYLOCAINE) 1 % injection (mL) Total volume:  5 mL Date/Time Rate/Dose/Volume Action   03/23/21 1243 5 mL Given    Radial Cocktail/Verapamil only (mL) Total volume:  10 mL Date/Time Rate/Dose/Volume Action   03/23/21 1245 10 mL Given    heparin sodium (porcine) injection (Units) Total dose:  8,000 Units Date/Time Rate/Dose/Volume Action   03/23/21 1252 8,000 Units Given    Heparin (Porcine) in NaCl 1000-0.9 UT/500ML-% SOLN (mL) Total volume:  1,000 mL Date/Time Rate/Dose/Volume Action   03/23/21 1252 500 mL Given   1252 500 mL Given    Sedation Time  Sedation Time Physician-1: 26 minutes 43 seconds Radiation/Fluoro  Fluoro time: 9.3 (min) DAP: 56.7 (Gycm2) Cumulative Air Kerma: 745.1 (mGy) Coronary Findings  Diagnostic Dominance: Right Left Anterior Descending  Vessel is large.  Left Circumflex  Vessel is large.  Right Coronary Artery  Vessel is large.  Intervention  No interventions have been documented. Coronary Diagrams  Diagnostic Dominance: Right Intervention  Implants     No implant documentation for this case.   Syngo Images   Show images for CARDIAC CATHETERIZATION Images on Long Term Storage   Show images for Raymundo, [place] to Procedure Log  Procedure Log    Hemo Data  Flowsheet Row Most Recent Value  Fick Cardiac Output 8.22 L/min  Fick Cardiac Output Index 2.72 (L/min)/BSA  Aortic Mean Gradient 41.3 mmHg  Aortic Peak Gradient 62 mmHg  Aortic Valve Area 1.35  Aortic Value Area Index 0.45 cm2/BSA   RA A Wave 13 mmHg  RA V Wave 15 mmHg  RA Mean 13 mmHg  RV Systolic Pressure 49 mmHg  RV Diastolic Pressure 10 mmHg  RV EDP  15 mmHg  PA Systolic Pressure 53 mmHg  PA Diastolic Pressure 23 mmHg  PA Mean 32 mmHg  PW A Wave -99 mmHg  PW V Wave 31 mmHg  PW Mean 22 mmHg  AO Systolic Pressure 123XX123 mmHg  AO Diastolic Pressure 64 mmHg  AO Mean 84 mmHg  LV Systolic Pressure Q000111Q mmHg  LV Diastolic Pressure 6 mmHg  LV EDP 19 mmHg  AOp Systolic Pressure [id number] mmHg  AOp Diastolic Pressure 72 mmHg  AOp Mean Pressure 91 mmHg  LVp Systolic Pressure Q000111Q mmHg  LVp Diastolic Pressure 6 mmHg  LVp EDP Pressure 19 mmHg  QP/QS 1  TPVR Index 11.78 HRUI  TSVR Index 30.91 HRUI  PVR SVR Ratio 0.14  TPVR/TSVR Ratio 0.38    ADDENDUM REPORT: 03/30/2021 08:59   ADDENDUM: Please see separate dictation for contemporaneously obtained CTA chest, abdomen and pelvis dated 03/30/2021 for full description of relevant extracardiac findings.     Electronically Signed   By: Vinnie Langton M.D.   On: 03/30/2021 08:59    Addended by Etheleen Mayhew, MD on 03/30/2021  9:01 AM   Study Result  Narrative & Impression  CLINICAL DATA:  Aortic Stenosis   EXAM: Cardiac TAVR CT   TECHNIQUE: The patient was scanned on a Siemens Force [id number] slice scanner. A 120 kV retrospective scan was triggered in the ascending thoracic aorta at 140 HU's. Gantry rotation speed was 250 msecs and collimation was .6 mm. No beta blockade or nitro were given. The 3D data set was reconstructed in 5% intervals of the R-R cycle. Systolic and diastolic phases were analyzed on a dedicated work station using MPR, MIP and VRT modes. The patient received 80 cc of contrast.   FINDINGS: Aortic Valve: Tri leaflet calcified with restricted motion calcium score 5500   Aorta: Moderate calcific atherosclerosis normal arch vessels   Sino-tubular Junction: 32 mm   Ascending Thoracic Aorta: 37 mm   Descending Thoracic Aorta: 29 mm    Sinus of Valsalva Measurements:   Non-coronary: 33.4 mm   Right - coronary: 33.8 mm   Left -   coronary: 39.27 mm   Coronary Artery Height above Annulus:   Left Main: 13.5 mm above annulus   Right Coronary: 13.2 mm above annulus   Virtual Basal Annulus Measurements:   Maximum / Minimum Diameter: 22.4 x 29 mm   Perimeter: 91.2 mm   Area: 567 mm2   Coronary Arteries: Sufficient height above annulus for deployment   Optimum Fluoroscopic Angle for Delivery: LAO 12 Cranial 4 degrees   IMPRESSION: Sub optimal study due to patients size with very poor opacification of aorta   1. Calcified tri leaflet AV with calcium score 5500   2. Optimum angiographic angle for deployment LAO 12 Cranial 4 degrees   3. Aortic annulus 567 mm2 should be suitable for a 29 mm Sapien 3 valve   4.  Normal aortic root 3.7 cm   5.  Coronary arteries sufficient height above annulus for deployment   Jenkins Rouge   Electronically Signed: By: Jenkins Rouge M.D. On: 03/29/2021 12:13    Narrative & Impression  CLINICAL DATA:  65 year old male with history of severe aortic stenosis. Preprocedural study prior to potential transcatheter aortic valve replacement (TAVR) procedure.   EXAM: CT ANGIOGRAPHY CHEST, ABDOMEN AND PELVIS   TECHNIQUE: Multidetector CT imaging through the chest, abdomen and pelvis was performed using the standard protocol during bolus administration of intravenous contrast. Multiplanar reconstructed images and MIPs were obtained  and reviewed to evaluate the vascular anatomy.   CONTRAST:  169m OMNIPAQUE IOHEXOL 350 MG/ML SOLN   COMPARISON:  No priors.   FINDINGS: CTA CHEST FINDINGS   Cardiovascular: Heart size is borderline enlarged. There is no significant pericardial fluid, thickening or pericardial calcification. There is aortic atherosclerosis, as well as atherosclerosis of the great vessels of the mediastinum and the coronary arteries, including calcified  atherosclerotic plaque in the left anterior descending and right coronary arteries. Severe calcifications of the aortic valve. Mild calcifications of the mitral annulus.   Mediastinum/Lymph Nodes: No pathologically enlarged mediastinal or hilar lymph nodes. Esophagus is unremarkable in appearance. No axillary lymphadenopathy.   Lungs/Pleura: No suspicious appearing pulmonary nodules or masses are noted. No acute consolidative airspace disease. No pleural effusions. Diffuse bronchial wall thickening with moderate centrilobular and mild paraseptal emphysema.   Musculoskeletal/Soft Tissues: There are no aggressive appearing lytic or blastic lesions noted in the visualized portions of the skeleton.   CTA ABDOMEN AND PELVIS FINDINGS   Hepatobiliary: Liver has a shrunken appearance and nodular contour, indicative of advanced cirrhosis. No discrete cystic or solid hepatic lesions are confidently identified. No intra or extrahepatic biliary ductal dilatation. Small calcified gallstones lying dependently in the gallbladder. No findings to suggest an acute cholecystitis are noted at this time.   Pancreas: No pancreatic mass. No pancreatic ductal dilatation. No pancreatic or peripancreatic fluid collections or inflammatory changes.   Spleen: Unremarkable.   Adrenals/Urinary Tract: Bilateral kidneys and adrenal glands are normal in appearance. No hydroureteronephrosis. Urinary bladder is nearly completely decompressed, but otherwise unremarkable in appearance.   Stomach/Bowel: Normal appearance of the stomach. No pathologic dilatation of small bowel or colon. Normal appendix.   Vascular/Lymphatic: Aortic atherosclerosis, without evidence of aneurysm or dissection in the abdominal or pelvic vasculature. Vascular findings and measurements pertinent to potential TAVR procedure, as detailed below. No lymphadenopathy noted in the abdomen or pelvis.   Reproductive: Prostate gland and  seminal vesicles are unremarkable in appearance.   Other: No significant volume of ascites.  No pneumoperitoneum.   Musculoskeletal: There are no aggressive appearing lytic or blastic lesions noted in the visualized portions of the skeleton.   VASCULAR MEASUREMENTS PERTINENT TO TAVR:   Comment: Suboptimal arterial opacification secondary to suboptimal contrast bolus severely limits the diagnostic quality of today's examination. Reported measurements below are considered best estimates given the nearly nondiagnostic quality of today's examination.   AORTA:   Minimal Aortic Diameter-20 x 15 mm   Severity of Aortic Calcification-moderate   RIGHT PELVIS:   Right Common Iliac Artery -   Minimal Diameter-15.3 x 15.4 mm   Tortuosity-mild   Calcification-moderate   Right External Iliac Artery -   Minimal Diameter-7.8 x 8.9 mm   Tortuosity - mild   Calcification-mild   Right Common Femoral Artery -   Minimal Diameter-8.5 x 7.5 mm   Tortuosity - mild   Calcification-mild   LEFT PELVIS:   Left Common Iliac Artery -   Minimal Diameter-15.0 x 12.7 mm   Tortuosity - mild   Calcification-moderate   Left External Iliac Artery -   Minimal Diameter-10.6 x 9.3 mm   Tortuosity - mild   Calcification-mild   Left Common Femoral Artery -   Minimal Diameter-9.8 x 7.8 mm   Tortuosity - mild   Calcification-mild   Review of the MIP images confirms the above findings.   IMPRESSION: 1. Suboptimal examination (nearly nondiagnostic) for assessment of the pelvic arteries, with best estimate of vascular findings and measurements  pertinent to potential TAVR procedure, as detailed above. 2. Severe calcifications of the aortic valve, compatible with reported clinical history of severe aortic stenosis. 3. Aortic atherosclerosis, in addition to 2 vessel coronary artery disease. Please note that although the presence of coronary artery calcium documents the presence of  coronary artery disease, the severity of this disease and any potential stenosis cannot be assessed on this non-gated CT examination. Assessment for potential risk factor modification, dietary therapy or pharmacologic therapy may be warranted, if clinically indicated. 4. Diffuse bronchial wall thickening with moderate centrilobular and mild paraseptal emphysema; imaging findings suggestive of underlying COPD. 5. Cirrhosis. 6. Cholelithiasis without evidence of acute cholecystitis at this time. 7. Additional incidental findings, as above.     Electronically Signed   By: Vinnie Langton M.D.   On: 03/30/2021 10:21    STS Risk Score: Procedure: AVR + CAB Risk of Mortality: 4.075% Renal Failure: 5.434% Permanent Stroke: 0.649% Prolonged Ventilation: 14.692% DSW Infection: 0.571% Reoperation: 5.331% Morbidity or Mortality: 20.801% Short Length of Stay: 22.340% Long Length of Stay: 9.925%   Impression:  This 65 year old gentleman has stage D, severe, symptomatic aortic stenosis with near Heart Association class III symptoms of exertional fatigue and shortness of breath consistent with chronic diastolic congestive heart failure.  I personally reviewed his 2D echocardiogram, cardiac catheterization, and CTA studies.  His echocardiogram shows a trileaflet aortic valve with severe thickening and calcification of the leaflets with restricted mobility.  The mean gradient is 49 mmHg consistent with severe aortic stenosis.  Left ventricular systolic function is normal.  Cardiac catheterization shows no evidence of coronary disease.  The mean gradient across aortic valve was 41.3 mmHg with a peak to peak gradient of 62 mmHg consistent with severe aortic stenosis.  I agree that aortic valve replacement is indicated in this patient for relief of his progressive symptoms and to prevent progressive left ventricular deterioration.  Given his weight of 425 pounds and comorbid risk factors I  think it is reasonable to recommend transcatheter aortic valve replacement instead of open surgical aortic valve replacement in this patient.  His weight and BSA puts him at the upper limits of suitability for cardiopulmonary bypass and I think his risk with open surgery would be significant with a very slow recovery.  His gated cardiac CTA shows anatomy suitable for TAVR using a 29 mm SAPIEN 3 valve.  His abdominal and pelvic CTA is poor quality but I think there is probably adequate pelvic vascular anatomy to allow transfemoral insertion.    The patient was counseled at length regarding treatment alternatives for management of severe symptomatic aortic stenosis. The risks and benefits of surgical intervention has been discussed in detail. Long-term prognosis with medical therapy was discussed. Alternative approaches such as conventional surgical aortic valve replacement, transcatheter aortic valve replacement, and palliative medical therapy were compared and contrasted at length. This discussion was placed in the context of the patient's own specific clinical presentation and past medical history. All of his questions have been addressed.   Following the decision to proceed with transcatheter aortic valve replacement, a discussion was held regarding what types of management strategies would be attempted intraoperatively in the event of life-threatening complications, including whether or not the patient would be considered a candidate for the use of cardiopulmonary bypass and/or conversion to open sternotomy for attempted surgical intervention.  Given his relatively young age I think he would be a candidate for emergent sternotomy to manage any intraoperative complications although at a significantly  increased risk due to his super morbid obesity.  The patient is aware of the fact that transient use of cardiopulmonary bypass may be necessary. The patient has been advised of a variety of complications that  might develop including but not limited to risks of death, stroke, paravalvular leak, aortic dissection or other major vascular complications, aortic annulus rupture, device embolization, cardiac rupture or perforation, mitral regurgitation, acute myocardial infarction, arrhythmia, heart block or bradycardia requiring permanent pacemaker placement, congestive heart failure, respiratory failure, renal failure, pneumonia, infection, other late complications related to structural valve deterioration or migration, or other complications that might ultimately cause a temporary or permanent loss of functional independence or other long term morbidity. The patient provides full informed consent for the procedure as described and all questions were answered.      Plan:  He will be scheduled for transfemoral transcatheter aortic valve replacement using a SAPIEN 3 valve on 04/18/2021.  I spent 60 minutes performing this consultation and > 50% of this time was spent face to face counseling and coordinating the care of this patient's severe symptomatic aortic stenosis.   Gaye Pollack, MD 04/12/2021 1:02 PM

## 2021-04-13 NOTE — Pre-Procedure Instructions (Signed)
Surgical Instructions   Your procedure is scheduled on Tuesday, August 30th. Report to [facility] Main Entrance "A" at 07:00 A.M., then check in with the Admitting office. Call this number if you have problems the morning of surgery: [phone]   If you have any questions prior to your surgery date call [phone]: Open Monday-Friday 8am-4pm   Remember: Do not eat or drink after midnight the night before your surgery     Stop taking Xarelto on 8/26 (Friday.) You will take your last dose on 8/25 (Thursday). Continue taking all other medications without change through the day before surgery. On the morning of surgery, do NOT take any medications    As of today, STOP taking Aleve, Naproxen, Ibuprofen, Motrin, Advil, Goody's, BC's, all herbal medications, fish oil, and all vitamins.          Do not wear jewelry  Do not wear lotions, powders, colognes, or deodorant. Men may shave face and neck. Do not bring valuables to the hospital. [facility] is not responsible for any belongings or valuables.             Do NOT Smoke (Tobacco/Vaping) or drink Alcohol 24 hours prior to your procedure If you use a CPAP at night, you may bring all equipment for your overnight stay.   Contacts, glasses, dentures or bridgework may not be worn into surgery, please bring cases for these belongings   For patients admitted to the hospital, discharge time will be determined by your treatment team.   Patients discharged the day of surgery will not be allowed to drive home, and someone needs to stay with them for 24 hours.  ONLY 1 SUPPORT PERSON MAY BE PRESENT WHILE YOU ARE IN SURGERY. IF YOU ARE TO BE ADMITTED ONCE YOU ARE IN YOUR ROOM YOU WILL BE ALLOWED TWO (2) VISITORS.  Minor children may have two parents present. Special consideration for safety and communication needs will be reviewed on a case by case basis.  Special instructions:    Oral Hygiene is also important to reduce your risk of  infection.  Remember - BRUSH YOUR TEETH THE MORNING OF SURGERY WITH YOUR REGULAR TOOTHPASTE   [place]- Preparing For Surgery  Before surgery, you can play an important role. Because skin is not sterile, your skin needs to be as free of germs as possible. You can reduce the number of germs on your skin by washing with CHG (chlorahexidine gluconate) Soap before surgery.  CHG is an antiseptic cleaner which kills germs and bonds with the skin to continue killing germs even after washing.     Please do not use if you have an allergy to CHG or antibacterial soaps. If your skin becomes reddened/irritated stop using the CHG.  Do not shave (including legs and underarms) for at least 48 hours prior to first CHG shower. It is OK to shave your face.  Please follow these instructions carefully.     Shower the NIGHT BEFORE SURGERY and the MORNING OF SURGERY with CHG Soap.   If you chose to wash your hair, wash your hair first as usual with your normal shampoo. After you shampoo, rinse your hair and body thoroughly to remove the shampoo.  Then [employer] and genitals (private parts) with your normal soap and rinse thoroughly to remove soap.  After that Use CHG Soap as you would any other liquid soap. You can apply CHG directly to the skin and wash gently with a scrungie or a clean washcloth.  Apply the CHG Soap to your body ONLY FROM THE NECK DOWN.  Do not use on open wounds or open sores. Avoid contact with your eyes, ears, mouth and genitals (private parts). Wash Face and genitals (private parts)  with your normal soap.   Wash thoroughly, paying special attention to the area where your surgery will be performed.  Thoroughly rinse your body with warm water from the neck down.  DO NOT shower/wash with your normal soap after using and rinsing off the CHG Soap.  Pat yourself dry with a CLEAN TOWEL.  Wear CLEAN PAJAMAS to bed the night before surgery  Place CLEAN SHEETS on your bed the night before  your surgery  DO NOT SLEEP WITH PETS.   Day of Surgery:  Take a shower with CHG soap. Wear Clean/Comfortable clothing the morning of surgery Do not apply any deodorants/lotions.   Remember to brush your teeth WITH YOUR REGULAR TOOTHPASTE.   Please read over the following fact sheets that you were given.

## 2021-04-14 ENCOUNTER — Ambulatory Visit (HOSPITAL_COMMUNITY)
Admission: RE | Admit: 2021-04-14 | Discharge: 2021-04-14 | Disposition: A | Discharge status: Home / Self Care | Payer: 59 | Source: Ambulatory Visit | Attending: Cardiovascular Disease | Admitting: Cardiovascular Disease

## 2021-04-14 ENCOUNTER — Other Ambulatory Visit: Payer: Self-pay

## 2021-04-14 ENCOUNTER — Encounter (HOSPITAL_COMMUNITY)
Admission: RE | Admit: 2021-04-14 | Discharge: 2021-04-14 | Disposition: A | Discharge status: Home / Self Care | Payer: 59 | Source: Ambulatory Visit | Attending: Cardiovascular Disease | Admitting: Cardiovascular Disease

## 2021-04-14 ENCOUNTER — Encounter (HOSPITAL_COMMUNITY): Payer: Self-pay

## 2021-04-14 DIAGNOSIS — Z01818 Encounter for other preprocedural examination: Secondary | ICD-10-CM | POA: Insufficient documentation

## 2021-04-14 DIAGNOSIS — I4891 Unspecified atrial fibrillation: Secondary | ICD-10-CM | POA: Diagnosis not present

## 2021-04-14 DIAGNOSIS — I350 Nonrheumatic aortic (valve) stenosis: Secondary | ICD-10-CM | POA: Insufficient documentation

## 2021-04-14 DIAGNOSIS — I452 Bifascicular block: Secondary | ICD-10-CM | POA: Insufficient documentation

## 2021-04-14 DIAGNOSIS — Z20822 Contact with and (suspected) exposure to covid-19: Secondary | ICD-10-CM | POA: Diagnosis not present

## 2021-04-14 LAB — CBC
HCT: 45.3 % (ref 39.0–52.0)
Hemoglobin: 14.8 g/dL (ref 13.0–17.0)
MCH: 31 pg (ref 26.0–34.0)
MCHC: 32.7 g/dL (ref 30.0–36.0)
MCV: 94.8 fL (ref 80.0–100.0)
Platelets: 216 10*3/uL (ref 150–400)
RBC: 4.78 MIL/uL (ref 4.22–5.81)
RDW: 14.5 % (ref 11.5–15.5)
WBC: 11.8 10*3/uL — ABNORMAL HIGH (ref 4.0–10.5)
nRBC: 0 % (ref 0.0–0.2)

## 2021-04-14 LAB — COMPREHENSIVE METABOLIC PANEL
ALT: 27 U/L (ref 0–44)
AST: 35 U/L (ref 15–41)
Albumin: 3.8 g/dL (ref 3.5–5.0)
Alkaline Phosphatase: 68 U/L (ref 38–126)
Anion gap: 11 (ref 5–15)
BUN: 22 mg/dL (ref 8–23)
CO2: 21 mmol/L — ABNORMAL LOW (ref 22–32)
Calcium: 9.1 mg/dL (ref 8.9–10.3)
Chloride: 103 mmol/L (ref 98–111)
Creatinine, Ser: 1.1 mg/dL (ref 0.61–1.24)
GFR, Estimated: >60 mL/min (ref >60)
Glucose, Bld: 91 mg/dL (ref 70–99)
Potassium: 5 mmol/L (ref 3.5–5.1)
Sodium: 135 mmol/L (ref 135–145)
Total Bilirubin: 1.6 mg/dL — ABNORMAL HIGH (ref 0.3–1.2)
Total Protein: 7.3 g/dL (ref 6.5–8.1)

## 2021-04-14 LAB — TYPE AND SCREEN
ABO/RH(D): O NEG
Antibody Screen: NEGATIVE

## 2021-04-14 LAB — URINALYSIS, ROUTINE W REFLEX MICROSCOPIC
Bacteria, UA: NONE SEEN
Bilirubin Urine: NEGATIVE
Glucose, UA: NEGATIVE mg/dL
Ketones, ur: NEGATIVE mg/dL
Leukocytes,Ua: NEGATIVE
Nitrite: NEGATIVE
Protein, ur: NEGATIVE mg/dL
Specific Gravity, Urine: 1.011 (ref 1.005–1.030)
pH: 5 (ref 5.0–8.0)

## 2021-04-14 LAB — SARS CORONAVIRUS 2 (TAT 6-24 HRS): SARS Coronavirus 2: NEGATIVE

## 2021-04-14 NOTE — Progress Notes (Signed)
PCP - Jill Alexanders MD Cardiologist - Anola Gurney MD  PPM/ICD - denies Device Orders -  Rep Notified -   Chest x-ray - 04/14/21 EKG - 04/14/21 Stress Test - 08/28/07 ECHO -03/02/21  Cardiac Cath - 03/23/21  Sleep Study - yes CPAP - uses Bipap  Fasting Blood Sugar - n/a Checks Blood Sugar _____ times a day  Blood Thinner Instructions:last dose of Xarelto is 04/13/21 Aspirin Instructions:n/a  ERAS Protcol -no PRE-SURGERY Ensure or G2-   COVID TEST- 04/14/21   Anesthesia review: yes  Patient denies shortness of breath, fever, cough and chest pain at PAT appointment   All instructions explained to the patient, with a verbal understanding of the material. Patient agrees to go over the instructions while at home for a better understanding. Patient also instructed to self quarantine after being tested for COVID-19. The opportunity to ask questions was provided.

## 2021-04-15 LAB — SURGICAL PCR SCREEN
MRSA, PCR: POSITIVE — AB
Staphylococcus aureus: POSITIVE — AB

## 2021-04-17 MED ORDER — VANCOMYCIN HCL 1500 MG/300ML IV SOLN
1500.0000 mg | Freq: Once | INTRAVENOUS | Status: AC
  Administered 2021-04-18: 1500 mg via INTRAVENOUS
  Filled 2021-04-17 (×2): qty 300

## 2021-04-17 MED ORDER — SODIUM CHLORIDE 0.9 % IV SOLN
INTRAVENOUS | Status: DC
  Filled 2021-04-17: qty 30

## 2021-04-17 MED ORDER — DEXMEDETOMIDINE HCL IN NACL 400 MCG/100ML IV SOLN
0.1000 ug/kg/h | INTRAVENOUS | Status: DC
  Filled 2021-04-17 (×2): qty 100

## 2021-04-17 MED ORDER — CEFAZOLIN IN SODIUM CHLORIDE 3-0.9 GM/100ML-% IV SOLN
3.0000 g | INTRAVENOUS | Status: AC
  Administered 2021-04-18: 3 g via INTRAVENOUS
  Filled 2021-04-17: qty 100

## 2021-04-17 MED ORDER — NOREPINEPHRINE 4 MG/250ML-% IV SOLN
0.0000 ug/min | INTRAVENOUS | Status: DC
Start: 2021-04-18 — End: 2021-04-18
  Filled 2021-04-17: qty 250

## 2021-04-17 MED ORDER — POTASSIUM CHLORIDE 2 MEQ/ML IV SOLN
80.0000 meq | INTRAVENOUS | Status: DC
  Filled 2021-04-17: qty 40

## 2021-04-17 MED ORDER — MAGNESIUM SULFATE 50 % IJ SOLN
40.0000 meq | INTRAMUSCULAR | Status: DC
  Filled 2021-04-17: qty 9.85

## 2021-04-17 NOTE — Anesthesia Preprocedure Evaluation (Addendum)
Anesthesia Evaluation  Patient identified by MRN, date of birth, ID band Patient awake    Reviewed: Allergy & Precautions, NPO status , Patient's Chart, lab work & pertinent test results  History of Anesthesia Complications Negative for: history of anesthetic complications  Airway Mallampati: III  TM Distance: >3 FB Neck ROM: Full    Dental  (+) Missing, Dental Advisory Given   Pulmonary shortness of breath, sleep apnea and Continuous Positive Airway Pressure Ventilation , former smoker,    Pulmonary exam normal        Cardiovascular hypertension, Pt. on medications and Pt. on home beta blockers (-) angina(-) CAD + dysrhythmias Atrial Fibrillation + Valvular Problems/Murmurs AS  Rhythm:Regular Rate:Normal + Systolic murmurs 0000000 ECHO: EF 60-65%, normal LVF, mod LVH, severe AS with AVA 0.67 cm2, mean gradient 49 mmHg   Neuro/Psych Anxiety negative neurological ROS     GI/Hepatic Neg liver ROS, GERD  Controlled,  Endo/Other  Morbid obesity  Renal/GU negative Renal ROS     Musculoskeletal  (+) Arthritis ,   Abdominal (+) + obese,   Peds  Hematology xarelto   Anesthesia Other Findings   Reproductive/Obstetrics                            Anesthesia Physical  Anesthesia Plan  ASA: 4  Anesthesia Plan: General   Post-op Pain Management:    Induction: Intravenous  PONV Risk Score and Plan: 2 and Ondansetron and Midazolam  Airway Management Planned: Oral ETT  Additional Equipment: Arterial line  Intra-op Plan:   Post-operative Plan: Extubation in OR  Informed Consent: I have reviewed the patients History and Physical, chart, labs and discussed the procedure including the risks, benefits and alternatives for the proposed anesthesia with the patient or authorized representative who has indicated his/her understanding and acceptance.     Dental advisory given  Plan Discussed with:  CRNA and Anesthesiologist  Anesthesia Plan Comments: (Pt may require GA due to OSA and morbid obesity.)      Anesthesia Quick Evaluation

## 2021-04-17 NOTE — H&P (Signed)
[name]       [name] [address]             [phone]      Cardiothoracic Surgery Admission History and Physical  PCP is Denita Lung, MD Referring Provider is Lauree Chandler, MD Primary Cardiologist is [name], MD   Reason for consultation:  Severe aortic stenosis   HPI:   The patient is a 65 year old gentleman with a history of hypertension, hyperlipidemia, super morbid obesity, paroxysmal atrial fibrillation, OSA, previous smoking, and severe aortic stenosis who was referred for consideration of aortic valve replacement.  He has been maintained on Xarelto for his paroxysmal atrial fibrillation.  Dr. Debara Pickett has been following his aortic stenosis with periodic echocardiograms.  His most recent echo on 03/02/2021 shows a trileaflet aortic valve with severe calcification and thickening and restricted leaflet mobility.  The mean gradient was 49 mmHg with a valve area by VTI of 0.67 cm consistent with severe aortic stenosis.  Left ventricular ejection fraction was 60 to 65%.  There was no mitral regurgitation.   He reports having progressive shortness of breath over the past 6 months to a year.  He has occasional chest pressure.  He denies dizziness or syncope.  He has had chronic lower extremity edema which seems to be worse over the past year.   He is single and lives in [name].  He works as a Educational psychologist at [name].       Past Medical History:  Diagnosis Date   Allergy      RHINITIS   Arthritis      thumb   Complication of anesthesia      2012 shoulder surgery: patient unsure of what the problem was    GERD (gastroesophageal reflux disease)      "not really"   History of echocardiogram 03/29/2009    EF 50-55%; mild concentric LVH;    History of nuclear stress test 08/28/2007    moderate diaphragmatic attenuation; EKG neg for ischemia    Hx of adenomatous colonic polyps     Hyperlipidemia     Hypertension       takes Diovan and Toprol   Obesity     PAF (paroxysmal atrial fibrillation) (HCC)      associated with OSA   Right bundle branch block     Rosacea     Shortness of breath      With activity   Sleep apnea      CPAP   Smoker     Systolic murmur             Past Surgical History:  Procedure Laterality Date   COLONOSCOPY WITH PROPOFOL N/A 11/25/2020    Procedure: COLONOSCOPY WITH PROPOFOL;  Surgeon: Carol Ada, MD;  Location: [facility];  Service: Endoscopy;  Laterality: N/A;   FINGER SURGERY        Reconstruction of Left thumb and L index finger.  Reconstruction    HEMOSTASIS CLIP PLACEMENT   11/25/2020    Procedure: HEMOSTASIS CLIP PLACEMENT;  Surgeon: Carol Ada, MD;  Location: [facility];  Service: Endoscopy;;   KNEE ARTHROSCOPY       MULTIPLE EXTRACTIONS WITH ALVEOLOPLASTY N/A 04/11/2021    Procedure: MULTIPLE EXTRACTION WITH ALVEOLOPLASTY;  Surgeon: Charlaine Dalton, DMD;  Location: [facility];  Service: Dentistry;  Laterality: N/A;   POLYPECTOMY   11/25/2020    Procedure: POLYPECTOMY;  Surgeon: Carol Ada, MD;  Location: [facility];  Service: Endoscopy;;   RIGHT/LEFT HEART CATH AND CORONARY ANGIOGRAPHY N/A 03/23/2021    Procedure: RIGHT/LEFT HEART CATH AND CORONARY ANGIOGRAPHY;  Surgeon: Burnell Blanks, MD;  Location: [facility];  Service: Cardiovascular;  Laterality: N/A;   SHOULDER CLOSED REDUCTION        Procedure: CLOSED REDUCTION SHOULDER;  Surgeon: Lawrence Santiago [facility];  Location: [facility];  Service: Orthopedics;  Laterality: Right;   SHOULDER OPEN ROTATOR CUFF REPAIR   08/11/2011    Procedure: ROTATOR CUFF REPAIR SHOULDER OPEN;  Surgeon: Willa Frater III;  Location: [facility];  Service: Orthopedics;  Laterality: Right;   SUBMUCOSAL LIFTING INJECTION   11/25/2020    Procedure: SUBMUCOSAL LIFTING INJECTION;  Surgeon: Carol Ada, MD;  Location: [facility];  Service: Endoscopy;;   SUBMUCOSAL TATTOO INJECTION   11/25/2020    Procedure: SUBMUCOSAL TATTOO  INJECTION;  Surgeon: Carol Ada, MD;  Location: [facility];  Service: Endoscopy;;   TONSILLECTOMY        as a child           Family History  Problem Relation Age of Onset   Heart disease Mother     Cancer - Prostate Maternal Grandfather     Cancer - Prostate Paternal Grandfather     Cancer - Colon Father     Heart attack Father 41   Cancer Paternal Uncle     Anesthesia problems Neg Hx     Stroke Neg Hx        Social History         Socioeconomic History   Marital status: Single      Spouse name: Not on file   Number of children: Not on file   Years of education: Not on file   Highest education level: Not on file  Occupational History   Not on file  Tobacco Use   Smoking status: Former      Packs/day: 1.00      Years: 40.00      Pack years: 40.00      Types: Cigarettes      Quit date: 06/20/2016      Years since quitting: 4.8   Smokeless tobacco: Never  Vaping Use   Vaping Use: Never used  Substance and Sexual Activity   Alcohol use: Yes      Alcohol/week: 10.0 standard drinks      Types: 2 Glasses of wine, 6 Cans of beer, 2 Shots of liquor per week   Drug use: No   Sexual activity: Not Currently  Other Topics Concern   Not on file  Social History Narrative   Not on file    Social Determinants of Health    Financial Resource Strain: Not on file  Food Insecurity: Not on file  Transportation Needs: Not on file  Physical Activity: Not on file  Stress: Not on file  Social Connections: Not on file  Intimate Partner Violence: Not on file             Prior to Admission medications   Medication Sig Start Date End Date Taking? Authorizing Provider  ALPRAZolam Duanne Moron) 1 MG tablet Take 1 tablet (1 mg total) by mouth at bedtime as needed for sleep. 12/22/20   Yes Denita Lung, MD  atorvastatin (LIPITOR) 10 MG tablet TAKE 1 TABLET BY MOUTH EVERY DAY AT 6 PM 04/26/20   Yes Denita Lung, MD  diltiazem (CARDIZEM CD) 120 MG 24 hr capsule Take 1 capsule (120 mg  total) by mouth daily. 04/26/20   Yes Denita Lung, MD  furosemide (LASIX) 40 MG tablet Take 1 tablet (40 mg total) by mouth daily. 03/16/21   Yes Burnell Blanks, MD  HYDROcodone-acetaminophen (NORCO) 5-325 MG tablet Take 1 tablet by mouth every 6 (six) hours as needed for up to 3 days for moderate pain or severe pain. 04/11/21 04/14/21 Yes Owsley, Madison B, DMD  HYDROcodone-acetaminophen (NORCO/VICODIN) 5-325 MG tablet Take 1 tablet by mouth every 6 (six) hours as needed for moderate pain. 03/10/21   Yes Denita Lung, MD  hydrocortisone 2.5 % cream Apply to external ears as needed for itching. 04/26/20   Yes Denita Lung, MD  methocarbamol (ROBAXIN) 500 MG tablet Take 1 tablet (500 mg total) by mouth 4 (four) times daily as needed for muscle spasms. 12/22/20   Yes Denita Lung, MD  metoprolol succinate (TOPROL-XL) 50 MG 24 hr tablet Take with or immediately following a meal. 04/26/20   Yes Denita Lung, MD  naproxen sodium (ALEVE) 220 MG tablet Take 110-220 mg by mouth 2 (two) times daily as needed (pain.).     Yes [provider]  potassium chloride SA (KLOR-CON) 20 MEQ tablet Take 1 tablet (20 mEq total) by mouth daily. 03/16/21   Yes Burnell Blanks, MD  rivaroxaban (XARELTO) 20 MG TABS tablet TAKE 1 TABLET(20 MG) BY MOUTH DAILY WITH SUPPER 04/26/20   Yes Denita Lung, MD  valsartan-hydrochlorothiazide (DIOVAN-HCT) 160-25 MG tablet Take 0.5 tablets by mouth daily. Take half tab daily for the next 3 days and if blood pressure sustains above 120 can start taking 1 tablet daily after that 05/23/20   Yes Domenic Polite, MD            Current Outpatient Medications  Medication Sig Dispense Refill   ALPRAZolam (XANAX) 1 MG tablet Take 1 tablet (1 mg total) by mouth at bedtime as needed for sleep. 30 tablet 0   atorvastatin (LIPITOR) 10 MG tablet TAKE 1 TABLET BY MOUTH EVERY DAY AT 6 PM 90 tablet 3   diltiazem (CARDIZEM CD) 120 MG 24 hr capsule Take 1 capsule (120 mg  total) by mouth daily. 90 capsule 3   furosemide (LASIX) 40 MG tablet Take 1 tablet (40 mg total) by mouth daily. 90 tablet 3   HYDROcodone-acetaminophen (NORCO) 5-325 MG tablet Take 1 tablet by mouth every 6 (six) hours as needed for up to 3 days for moderate pain or severe pain. 12 tablet 0   HYDROcodone-acetaminophen (NORCO/VICODIN) 5-325 MG tablet Take 1 tablet by mouth every 6 (six) hours as needed for moderate pain. 30 tablet 0   hydrocortisone 2.5 % cream Apply to external ears as needed for itching. 30 g 5   methocarbamol (ROBAXIN) 500 MG tablet Take 1 tablet (500 mg total) by mouth 4 (four) times daily as needed for muscle spasms. 30 tablet 0   metoprolol succinate (TOPROL-XL) 50 MG 24 hr tablet Take with or immediately following a meal. 90 tablet 3   naproxen sodium (ALEVE) 220 MG tablet Take 110-220 mg by mouth 2 (two) times daily as needed (pain.).       potassium chloride SA (KLOR-CON) 20 MEQ tablet Take 1 tablet (20 mEq total) by mouth daily. 90 tablet 3   rivaroxaban (XARELTO) 20 MG TABS tablet TAKE 1 TABLET(20 MG) BY MOUTH DAILY WITH SUPPER 90 tablet 3   valsartan-hydrochlorothiazide (DIOVAN-HCT) 160-25 MG tablet Take 0.5 tablets by mouth daily. Take half tab daily  for the next 3 days and if blood pressure sustains above 120 can start taking 1 tablet daily after that        No current facility-administered medications for this visit.          Allergies  Allergen Reactions   Codeine Shortness Of Breath and Swelling   Morphine And Related Shortness Of Breath and Swelling          Review of Systems:               General:                      normal appetite, + decreased energy, no weight gain, no weight loss, no fever             Cardiac:                       no chest pain with exertion, no chest pain at rest, +SOB with mild exertion, no resting SOB, no PND, no orthopnea, no palpitations, + arrhythmia, + atrial fibrillation, + LE edema, no dizzy spells, no syncope              Respiratory:                 + exertional shortness of breath, no home oxygen, no productive cough, no dry cough, no bronchitis, no wheezing, no hemoptysis, no asthma, no pain with inspiration or cough, + sleep apnea, + CPAP at night             GI:                               no difficulty swallowing, no reflux, no frequent heartburn, no hiatal hernia, no abdominal pain, + constipation, no diarrhea, no hematochezia, no hematemesis, no melena             GU:                              no dysuria,  no frequency, no urinary tract infection, no hematuria, no enlarged prostate, no kidney stones, no kidney disease             Vascular:                     no pain suggestive of claudication, no pain in feet, no leg cramps, no varicose veins, no DVT, no non-healing foot ulcer             Neuro:                         + stroke 2017 without residual, no TIA's, no seizures, no headaches, no temporary blindness one eye,  no slurred speech, no peripheral neuropathy, no chronic pain, no instability of gait, no memory/cognitive dysfunction             Musculoskeletal:         + arthritis - primarily involving the left knee, + joint swelling, + myalgias,  +difficulty walking, + decreased mobility              Skin:                            + rash, + itching,  no skin infections, no pressure sores or ulcerations             Psych:                         no anxiety, no depression, no nervousness, no unusual recent stress             Eyes:                           no blurry vision, no floaters, no recent vision changes, no glasses or contacts             ENT:                            no hearing loss, + loose or painful teeth, no dentures, last saw dentist yesterday for dental extraction of 5 teeth ( Dr. Benson Norway)             Hematologic:               no easy bruising, no abnormal bleeding, no clotting disorder, no frequent epistaxis             Endocrine:                   no diabetes, does not check CBG's at  home                            Physical Exam:               BP 120/63 (BP Location: Left Arm, Patient Position: Sitting)   Pulse 81   Resp 20   Ht '6\' 3"'$  (1.905 m)   Wt (!) 425 lb (192.8 kg)   SpO2 93% Comment: RA  BMI 53.12 kg/m              General:                      Obese,  well-appearing             HEENT:                       Unremarkable, NCAT, PERLA, EOMI             Neck:                           no JVD, no bruits, no adenopathy              Chest:                          clear to auscultation, symmetrical breath sounds, no wheezes, no rhonchi              CV:                              RRR, 3/6 systolic murmur RSB             Abdomen:                    soft, non-tender, large panniculus  Extremities:                 warm, well-perfused, pulses not palpable, moderate lower extremity edema             Rectal/GU                   Deferred             Neuro:                         Grossly non-focal and symmetrical throughout             Skin:                            Clean and dry, no rashes, no breakdown   Diagnostic Tests:     ECHOCARDIOGRAM REPORT         Patient Name:   David [name]  Date of Exam: 03/02/2021  Medical Rec #:  [id number]     Height:       75.0 in  Accession #:    [id number]    Weight:       442.4 lb  Date of Birth:  03/20/1956      BSA:          3.075 m  Patient Age:    33 years      BP:           158/86 mmHg  Patient Gender: M             HR:           83 bpm.  Exam Location:  [facility]   Procedure: 2D Echo, Cardiac Doppler, Color Doppler and Intracardiac             Opacification Agent   Indications:    I35.0 Aortic stenosis     History:        Patient has prior history of Echocardiogram examinations,  most                  recent 08/22/2020. Arrythmias:RBBB, Signs/Symptoms:Murmur;  Risk                  Factors:Hypertension, HLD and Sleep Apnea.     Sonographer:    Marygrace Drought RCS  Referring Phys: [place]     1. Left ventricular ejection fraction, by estimation, is 60 to 65%. The  left ventricle has normal function. The left ventricle has no regional  wall motion abnormalities. There is moderate concentric left ventricular  hypertrophy. Left ventricular  diastolic function could not be evaluated. Elevated left ventricular  end-diastolic pressure.   2. Right ventricular systolic function is normal. The right ventricular  size is normal.   3. The mitral valve is normal in structure. No evidence of mitral valve  regurgitation. No evidence of mitral stenosis.   4. The aortic valve is tricuspid. There is severe calcifcation of the  aortic valve. There is severe thickening of the aortic valve. Aortic valve  regurgitation is not visualized. Severe aortic valve stenosis. Aortic  valve area, by VTI measures 0.67 cm.  Aortic valve mean gradient measures 49.0 mmHg. Aortic valve Vmax measures  4.53 m/s.   5. Aortic dilatation noted. There is borderline dilatation of the  ascending aorta, measuring 38 mm.   6. The inferior vena cava is  normal in size with greater than 50%  respiratory variability, suggesting right atrial pressure of 3 mmHg.   FINDINGS   Left Ventricle: Left ventricular ejection fraction, by estimation, is 60  to 65%. The left ventricle has normal function. The left ventricle has no  regional wall motion abnormalities. The left ventricular internal cavity  size was normal in size. There is   moderate concentric left ventricular hypertrophy. Left ventricular  diastolic function could not be evaluated due to atrial fibrillation. Left  ventricular diastolic function could not be evaluated. Elevated left  ventricular end-diastolic pressure.   Right Ventricle: The right ventricular size is normal. No increase in  right ventricular wall thickness. Right ventricular systolic function is  normal.   Left Atrium: Left atrial size was normal in size.   Right  Atrium: Right atrial size was normal in size.   Pericardium: There is no evidence of pericardial effusion.   Mitral Valve: The mitral valve is normal in structure. Mild mitral annular  calcification. No evidence of mitral valve regurgitation. No evidence of  mitral valve stenosis.   Tricuspid Valve: The tricuspid valve is normal in structure. Tricuspid  valve regurgitation is not demonstrated. No evidence of tricuspid  stenosis.   Aortic Valve: The aortic valve is tricuspid. There is severe calcifcation  of the aortic valve. There is severe thickening of the aortic valve.  Aortic valve regurgitation is not visualized. Severe aortic stenosis is  present. Aortic valve mean gradient  measures 49.0 mmHg. Aortic valve peak gradient measures 82.1 mmHg. Aortic  valve area, by VTI measures 0.67 cm.   Pulmonic Valve: The pulmonic valve was normal in structure. Pulmonic valve  regurgitation is not visualized. No evidence of pulmonic stenosis.   Aorta: Aortic dilatation noted. There is borderline dilatation of the  ascending aorta, measuring 38 mm.   Venous: The inferior vena cava is normal in size with greater than 50%  respiratory variability, suggesting right atrial pressure of 3 mmHg.   IAS/Shunts: No atrial level shunt detected by color flow Doppler.      LEFT VENTRICLE  PLAX 2D  LVIDd:         4.90 cm  Diastology  LVIDs:         3.50 cm  LV e' medial:    10.60 cm/s  LV PW:         1.55 cm  LV E/e' medial:  13.8  LV IVS:        1.45 cm  LV e' lateral:   11.70 cm/s  LVOT diam:     2.20 cm  LV E/e' lateral: 12.5  LV SV:         77  LV SV Index:   25  LVOT Area:     3.80 cm      RIGHT VENTRICLE  RV Basal diam:  3.80 cm  RV S prime:     13.70 cm/s  TAPSE (M-mode): 1.6 cm   LEFT ATRIUM              Index       RIGHT ATRIUM           Index  LA diam:        4.80 cm  1.56 cm/m  RA Area:     25.50 cm  LA Vol (A2C):   116.0 ml 37.72 ml/m RA Volume:   86.30 ml  28.06 ml/m  LA  Vol (A4C):   148.0 ml 48.13 ml/m  LA Biplane  Vol: 133.0 ml 43.25 ml/m   AORTIC VALVE  AV Area (Vmax):    0.70 cm  AV Area (Vmean):   0.71 cm  AV Area (VTI):     0.67 cm  AV Vmax:           453.00 cm/s  AV Vmean:          331.000 cm/s  AV VTI:            1.160 m  AV Peak Grad:      82.1 mmHg  AV Mean Grad:      49.0 mmHg  LVOT Vmax:         82.90 cm/s  LVOT Vmean:        62.100 cm/s  LVOT VTI:          0.203 m  LVOT/AV VTI ratio: 0.18     AORTA  Ao Root diam: 3.60 cm  Ao Asc diam:  3.80 cm   MITRAL VALVE  MV Area (PHT):              SHUNTS  MV Decel Time:              Systemic VTI:  0.20 m  MV E velocity: 146.00 cm/s  Systemic Diam: 2.20 cm   Fransico Him MD  Electronically signed by Fransico Him MD  Signature Date/Time: 03/03/2021/11:21:50 AM         Final     Physicians   Panel Physicians Referring Physician Case Authorizing Physician  Burnell Blanks, MD (Primary)        Procedures   RIGHT/LEFT HEART CATH AND CORONARY ANGIOGRAPHY    Conclusion   No angiographic evidence of CAD Severe aortic stenosis (mean gradient 41.3 mmHg, peak to peak gradient 62 mmHg)   Will continue workup for TAVR.    Indications   Severe aortic stenosis [I35.0 (ICD-10-CM)]    Procedural Details   Technical Details Indication: 65 yo male with history of PAF, morbid obesity and severe AS. Workup for TAVR  Procedure: The risks, benefits, complications, treatment options, and expected outcomes were discussed with the patient. The patient and/or family concurred with the proposed plan, giving informed consent. The patient was brought to the cath lab after IV hydration was given. The patient was sedated with Versed and Fentanyl. The IV catheter in the right antecubital vein was changed for a 5 [place] sheath. Right heart catheterization performed with a balloon tipped catheter. The right wrist was prepped and draped in a sterile fashion. 1% lidocaine was used for local  anesthesia. Using the modified Seldinger access technique, a 5 French sheath was placed in the right radial artery. 3 mg Verapamil was given through the sheath. 8000 units IV heparin was given. Standard diagnostic catheters were used to perform selective coronary angiography. The aortic valve was crossed with the JR4 catheter. LV pressures measured. No LV gram. The sheath was removed from the right radial artery and a Terumo hemostasis band was applied at the arteriotomy site on the right wrist.      Estimated blood loss <50 mL.   During this procedure medications were administered to achieve and maintain moderate conscious sedation while the patient's heart rate, blood pressure, and oxygen saturation were continuously monitored and I was present face-to-face 100% of this time.    Medications (Filter: Administrations occurring from [id number] to 1321 on 03/23/21)  important  Continuous medications are totaled by the amount administered until 03/23/21 1321.    fentaNYL (SUBLIMAZE)  injection (mcg) Total dose:  25 mcg Date/Time Rate/Dose/Volume Action    03/23/21 1237 25 mcg Given      midazolam (VERSED) injection (mg) Total dose:  1 mg Date/Time Rate/Dose/Volume Action    03/23/21 1238 1 mg Given      lidocaine (PF) (XYLOCAINE) 1 % injection (mL) Total volume:  5 mL Date/Time Rate/Dose/Volume Action    03/23/21 1243 5 mL Given      Radial Cocktail/Verapamil only (mL) Total volume:  10 mL Date/Time Rate/Dose/Volume Action    03/23/21 1245 10 mL Given      heparin sodium (porcine) injection (Units) Total dose:  8,000 Units Date/Time Rate/Dose/Volume Action    03/23/21 1252 8,000 Units Given      Heparin (Porcine) in NaCl 1000-0.9 UT/500ML-% SOLN (mL) Total volume:  1,000 mL Date/Time Rate/Dose/Volume Action    03/23/21 1252 500 mL Given    1252 500 mL Given      Sedation Time   Sedation Time Physician-1: 26 minutes 43 seconds Radiation/Fluoro   Fluoro time: 9.3 (min) DAP: 56.7  (Gycm2) Cumulative Air Kerma: 745.1 (mGy) Coronary Findings   Diagnostic Dominance: Right Left Anterior Descending  Vessel is large.  Left Circumflex  Vessel is large.  Right Coronary Artery  Vessel is large.  Intervention   No interventions have been documented. Coronary Diagrams   Diagnostic Dominance: Right Intervention   Implants      No implant documentation for this case.    [employer]ng[employer] Images    Show images for CARDIAC CATHETERIZATION Images on Long Term Storage    Show images for Lazarus, Jaquith to Procedure Log   Procedure Log    Hemo Data   Flowsheet Row Most Recent Value  Fick Cardiac Output 8.22 L/min  Fick Cardiac Output Index 2.72 (L/min)/BSA  Aortic Mean Gradient 41.3 mmHg  Aortic Peak Gradient 62 mmHg  Aortic Valve Area 1.35  Aortic Value Area Index 0.45 cm2/BSA  RA A Wave 13 mmHg  RA V Wave 15 mmHg  RA Mean 13 mmHg  RV Systolic Pressure 49 mmHg  RV Diastolic Pressure 10 mmHg  RV EDP 15 mmHg  PA Systolic Pressure 53 mmHg  PA Diastolic Pressure 23 mmHg  PA Mean 32 mmHg  PW A Wave -99 mmHg  PW V Wave 31 mmHg  PW Mean 22 mmHg  AO Systolic Pressure 123XX123 mmHg  AO Diastolic Pressure 64 mmHg  AO Mean 84 mmHg  LV Systolic Pressure Q000111Q mmHg  LV Diastolic Pressure 6 mmHg  LV EDP 19 mmHg  AOp Systolic Pressure [id number] mmHg  AOp Diastolic Pressure 72 mmHg  AOp Mean Pressure 91 mmHg  LVp Systolic Pressure Q000111Q mmHg  LVp Diastolic Pressure 6 mmHg  LVp EDP Pressure 19 mmHg  QP/QS 1  TPVR Index 11.78 HRUI  TSVR Index 30.91 HRUI  PVR SVR Ratio 0.14  TPVR/TSVR Ratio 0.38      ADDENDUM REPORT: 03/30/2021 08:59   ADDENDUM: Please see separate dictation for contemporaneously obtained CTA chest, abdomen and pelvis dated 03/30/2021 for full description of relevant extracardiac findings.     Electronically Signed   By: Vinnie Langton M.D.   On: 03/30/2021 08:59    Addended by Etheleen Mayhew, MD on 03/30/2021  9:01 AM    Study Result    Narrative & Impression  CLINICAL DATA:  Aortic Stenosis   EXAM: Cardiac TAVR CT   TECHNIQUE: The patient was scanned on a Siemens Force [id number] slice scanner. A 120 kV retrospective  scan was triggered in the ascending thoracic aorta at 140 HU's. Gantry rotation speed was 250 msecs and collimation was .6 mm. No beta blockade or nitro were given. The 3D data set was reconstructed in 5% intervals of the R-R cycle. Systolic and diastolic phases were analyzed on a dedicated work station using MPR, MIP and VRT modes. The patient received 80 cc of contrast.   FINDINGS: Aortic Valve: Tri leaflet calcified with restricted motion calcium score 5500   Aorta: Moderate calcific atherosclerosis normal arch vessels   Sino-tubular Junction: 32 mm   Ascending Thoracic Aorta: 37 mm   Descending Thoracic Aorta: 29 mm   Sinus of Valsalva Measurements:   Non-coronary: 33.4 mm   Right - coronary: 33.8 mm   Left -   coronary: 39.27 mm   Coronary Artery Height above Annulus:   Left Main: 13.5 mm above annulus   Right Coronary: 13.2 mm above annulus   Virtual Basal Annulus Measurements:   Maximum / Minimum Diameter: 22.4 x 29 mm   Perimeter: 91.2 mm   Area: 567 mm2   Coronary Arteries: Sufficient height above annulus for deployment   Optimum Fluoroscopic Angle for Delivery: LAO 12 Cranial 4 degrees   IMPRESSION: Sub optimal study due to patients size with very poor opacification of aorta   1. Calcified tri leaflet AV with calcium score 5500   2. Optimum angiographic angle for deployment LAO 12 Cranial 4 degrees   3. Aortic annulus 567 mm2 should be suitable for a 29 mm Sapien 3 valve   4.  Normal aortic root 3.7 cm   5.  Coronary arteries sufficient height above annulus for deployment   Jenkins Rouge   Electronically Signed: By: Jenkins Rouge M.D. On: 03/29/2021 12:13      Narrative & Impression  CLINICAL DATA:  65 year old male with history of severe  aortic stenosis. Preprocedural study prior to potential transcatheter aortic valve replacement (TAVR) procedure.   EXAM: CT ANGIOGRAPHY CHEST, ABDOMEN AND PELVIS   TECHNIQUE: Multidetector CT imaging through the chest, abdomen and pelvis was performed using the standard protocol during bolus administration of intravenous contrast. Multiplanar reconstructed images and MIPs were obtained and reviewed to evaluate the vascular anatomy.   CONTRAST:  163m OMNIPAQUE IOHEXOL 350 MG/ML SOLN   COMPARISON:  No priors.   FINDINGS: CTA CHEST FINDINGS   Cardiovascular: Heart size is borderline enlarged. There is no significant pericardial fluid, thickening or pericardial calcification. There is aortic atherosclerosis, as well as atherosclerosis of the great vessels of the mediastinum and the coronary arteries, including calcified atherosclerotic plaque in the left anterior descending and right coronary arteries. Severe calcifications of the aortic valve. Mild calcifications of the mitral annulus.   Mediastinum/Lymph Nodes: No pathologically enlarged mediastinal or hilar lymph nodes. Esophagus is unremarkable in appearance. No axillary lymphadenopathy.   Lungs/Pleura: No suspicious appearing pulmonary nodules or masses are noted. No acute consolidative airspace disease. No pleural effusions. Diffuse bronchial wall thickening with moderate centrilobular and mild paraseptal emphysema.   Musculoskeletal/Soft Tissues: There are no aggressive appearing lytic or blastic lesions noted in the visualized portions of the skeleton.   CTA ABDOMEN AND PELVIS FINDINGS   Hepatobiliary: Liver has a shrunken appearance and nodular contour, indicative of advanced cirrhosis. No discrete cystic or solid hepatic lesions are confidently identified. No intra or extrahepatic biliary ductal dilatation. Small calcified gallstones lying dependently in the gallbladder. No findings to suggest an  acute cholecystitis are noted at this time.   Pancreas: No  pancreatic mass. No pancreatic ductal dilatation. No pancreatic or peripancreatic fluid collections or inflammatory changes.   Spleen: Unremarkable.   Adrenals/Urinary Tract: Bilateral kidneys and adrenal glands are normal in appearance. No hydroureteronephrosis. Urinary bladder is nearly completely decompressed, but otherwise unremarkable in appearance.   Stomach/Bowel: Normal appearance of the stomach. No pathologic dilatation of small bowel or colon. Normal appendix.   Vascular/Lymphatic: Aortic atherosclerosis, without evidence of aneurysm or dissection in the abdominal or pelvic vasculature. Vascular findings and measurements pertinent to potential TAVR procedure, as detailed below. No lymphadenopathy noted in the abdomen or pelvis.   Reproductive: Prostate gland and seminal vesicles are unremarkable in appearance.   Other: No significant volume of ascites.  No pneumoperitoneum.   Musculoskeletal: There are no aggressive appearing lytic or blastic lesions noted in the visualized portions of the skeleton.   VASCULAR MEASUREMENTS PERTINENT TO TAVR:   Comment: Suboptimal arterial opacification secondary to suboptimal contrast bolus severely limits the diagnostic quality of today's examination. Reported measurements below are considered best estimates given the nearly nondiagnostic quality of today's examination.   AORTA:   Minimal Aortic Diameter-20 x 15 mm   Severity of Aortic Calcification-moderate   RIGHT PELVIS:   Right Common Iliac Artery -   Minimal Diameter-15.3 x 15.4 mm   Tortuosity-mild   Calcification-moderate   Right External Iliac Artery -   Minimal Diameter-7.8 x 8.9 mm   Tortuosity - mild   Calcification-mild   Right Common Femoral Artery -   Minimal Diameter-8.5 x 7.5 mm   Tortuosity - mild   Calcification-mild   LEFT PELVIS:   Left Common Iliac Artery -   Minimal  Diameter-15.0 x 12.7 mm   Tortuosity - mild   Calcification-moderate   Left External Iliac Artery -   Minimal Diameter-10.6 x 9.3 mm   Tortuosity - mild   Calcification-mild   Left Common Femoral Artery -   Minimal Diameter-9.8 x 7.8 mm   Tortuosity - mild   Calcification-mild   Review of the MIP images confirms the above findings.   IMPRESSION: 1. Suboptimal examination (nearly nondiagnostic) for assessment of the pelvic arteries, with best estimate of vascular findings and measurements pertinent to potential TAVR procedure, as detailed above. 2. Severe calcifications of the aortic valve, compatible with reported clinical history of severe aortic stenosis. 3. Aortic atherosclerosis, in addition to 2 vessel coronary artery disease. Please note that although the presence of coronary artery calcium documents the presence of coronary artery disease, the severity of this disease and any potential stenosis cannot be assessed on this non-gated CT examination. Assessment for potential risk factor modification, dietary therapy or pharmacologic therapy may be warranted, if clinically indicated. 4. Diffuse bronchial wall thickening with moderate centrilobular and mild paraseptal emphysema; imaging findings suggestive of underlying COPD. 5. Cirrhosis. 6. Cholelithiasis without evidence of acute cholecystitis at this time. 7. Additional incidental findings, as above.     Electronically Signed   By: Vinnie Langton M.D.   On: 03/30/2021 10:21      STS Risk Score: Procedure: AVR + CAB Risk of Mortality: 4.075% Renal Failure: 5.434% Permanent Stroke: 0.649% Prolonged Ventilation: 14.692% DSW Infection: 0.571% Reoperation: 5.331% Morbidity or Mortality: 20.801% Short Length of Stay: 22.340% Long Length of Stay: 9.925%     Impression:   This 65 year old gentleman has stage D, severe, symptomatic aortic stenosis with near Heart Association class III symptoms  of exertional fatigue and shortness of breath consistent with chronic diastolic congestive heart failure.  I personally reviewed  his 2D echocardiogram, cardiac catheterization, and CTA studies.  His echocardiogram shows a trileaflet aortic valve with severe thickening and calcification of the leaflets with restricted mobility.  The mean gradient is 49 mmHg consistent with severe aortic stenosis.  Left ventricular systolic function is normal.  Cardiac catheterization shows no evidence of coronary disease.  The mean gradient across aortic valve was 41.3 mmHg with a peak to peak gradient of 62 mmHg consistent with severe aortic stenosis.  I agree that aortic valve replacement is indicated in this patient for relief of his progressive symptoms and to prevent progressive left ventricular deterioration.  Given his weight of 425 pounds and comorbid risk factors I think it is reasonable to recommend transcatheter aortic valve replacement instead of open surgical aortic valve replacement in this patient.  His weight and BSA puts him at the upper limits of suitability for cardiopulmonary bypass and I think his risk with open surgery would be significant with a very slow recovery.  His gated cardiac CTA shows anatomy suitable for TAVR using a 29 mm SAPIEN 3 valve.  His abdominal and pelvic CTA is poor quality but I think there is probably adequate pelvic vascular anatomy to allow transfemoral insertion.     The patient was counseled at length regarding treatment alternatives for management of severe symptomatic aortic stenosis. The risks and benefits of surgical intervention has been discussed in detail. Long-term prognosis with medical therapy was discussed. Alternative approaches such as conventional surgical aortic valve replacement, transcatheter aortic valve replacement, and palliative medical therapy were compared and contrasted at length. This discussion was placed in the context of the patient's own specific clinical  presentation and past medical history. All of his questions have been addressed.    Following the decision to proceed with transcatheter aortic valve replacement, a discussion was held regarding what types of management strategies would be attempted intraoperatively in the event of life-threatening complications, including whether or not the patient would be considered a candidate for the use of cardiopulmonary bypass and/or conversion to open sternotomy for attempted surgical intervention.  Given his relatively young age I think he would be a candidate for emergent sternotomy to manage any intraoperative complications although at a significantly increased risk due to his super morbid obesity.  The patient is aware of the fact that transient use of cardiopulmonary bypass may be necessary. The patient has been advised of a variety of complications that might develop including but not limited to risks of death, stroke, paravalvular leak, aortic dissection or other major vascular complications, aortic annulus rupture, device embolization, cardiac rupture or perforation, mitral regurgitation, acute myocardial infarction, arrhythmia, heart block or bradycardia requiring permanent pacemaker placement, congestive heart failure, respiratory failure, renal failure, pneumonia, infection, other late complications related to structural valve deterioration or migration, or other complications that might ultimately cause a temporary or permanent loss of functional independence or other long term morbidity. The patient provides full informed consent for the procedure as described and all questions were answered.       Plan:   Transfemoral transcatheter aortic valve replacement using a SAPIEN 3 valve.    Gaye Pollack, MD

## 2021-04-18 ENCOUNTER — Other Ambulatory Visit: Payer: Self-pay | Admitting: Physician Assistant

## 2021-04-18 ENCOUNTER — Encounter: Payer: Self-pay | Admitting: Physician Assistant

## 2021-04-18 ENCOUNTER — Encounter (HOSPITAL_COMMUNITY)
Admission: RE | Disposition: A | Discharge status: Home / Self Care | Payer: 59 | Source: Home / Self Care | Attending: Cardiovascular Disease

## 2021-04-18 ENCOUNTER — Encounter (HOSPITAL_COMMUNITY): Payer: Self-pay | Admitting: Cardiovascular Disease

## 2021-04-18 ENCOUNTER — Inpatient Hospital Stay (HOSPITAL_COMMUNITY): Payer: 59

## 2021-04-18 ENCOUNTER — Inpatient Hospital Stay (HOSPITAL_COMMUNITY): Payer: 59 | Admitting: Physician Assistant

## 2021-04-18 ENCOUNTER — Inpatient Hospital Stay (HOSPITAL_COMMUNITY)
Admission: RE | Admit: 2021-04-18 | Discharge: 2021-04-20 | DRG: 267 | Disposition: A | Discharge status: Home / Self Care | Payer: 59 | Attending: Cardiovascular Disease | Admitting: Cardiovascular Disease

## 2021-04-18 ENCOUNTER — Other Ambulatory Visit: Payer: Self-pay

## 2021-04-18 DIAGNOSIS — I10 Essential (primary) hypertension: Secondary | ICD-10-CM | POA: Diagnosis present

## 2021-04-18 DIAGNOSIS — Z8673 Personal history of transient ischemic attack (TIA), and cerebral infarction without residual deficits: Secondary | ICD-10-CM | POA: Diagnosis not present

## 2021-04-18 DIAGNOSIS — I480 Paroxysmal atrial fibrillation: Secondary | ICD-10-CM | POA: Diagnosis not present

## 2021-04-18 DIAGNOSIS — Z7901 Long term (current) use of anticoagulants: Secondary | ICD-10-CM | POA: Diagnosis not present

## 2021-04-18 DIAGNOSIS — I350 Nonrheumatic aortic (valve) stenosis: Secondary | ICD-10-CM

## 2021-04-18 DIAGNOSIS — Z6841 Body Mass Index (BMI) 40.0 and over, adult: Secondary | ICD-10-CM

## 2021-04-18 DIAGNOSIS — Z87891 Personal history of nicotine dependence: Secondary | ICD-10-CM

## 2021-04-18 DIAGNOSIS — Z950 Presence of cardiac pacemaker: Secondary | ICD-10-CM | POA: Diagnosis not present

## 2021-04-18 DIAGNOSIS — Z952 Presence of prosthetic heart valve: Secondary | ICD-10-CM

## 2021-04-18 DIAGNOSIS — I452 Bifascicular block: Secondary | ICD-10-CM | POA: Diagnosis present

## 2021-04-18 DIAGNOSIS — Z006 Encounter for examination for normal comparison and control in clinical research program: Secondary | ICD-10-CM | POA: Diagnosis not present

## 2021-04-18 DIAGNOSIS — I4811 Longstanding persistent atrial fibrillation: Secondary | ICD-10-CM | POA: Diagnosis present

## 2021-04-18 DIAGNOSIS — E785 Hyperlipidemia, unspecified: Secondary | ICD-10-CM | POA: Diagnosis present

## 2021-04-18 DIAGNOSIS — I442 Atrioventricular block, complete: Secondary | ICD-10-CM | POA: Diagnosis not present

## 2021-04-18 DIAGNOSIS — Z95818 Presence of other cardiac implants and grafts: Secondary | ICD-10-CM

## 2021-04-18 DIAGNOSIS — K219 Gastro-esophageal reflux disease without esophagitis: Secondary | ICD-10-CM | POA: Diagnosis present

## 2021-04-18 DIAGNOSIS — G4733 Obstructive sleep apnea (adult) (pediatric): Secondary | ICD-10-CM | POA: Diagnosis present

## 2021-04-18 DIAGNOSIS — Z8249 Family history of ischemic heart disease and other diseases of the circulatory system: Secondary | ICD-10-CM

## 2021-04-18 DIAGNOSIS — I4819 Other persistent atrial fibrillation: Secondary | ICD-10-CM | POA: Diagnosis not present

## 2021-04-18 DIAGNOSIS — Z885 Allergy status to narcotic agent status: Secondary | ICD-10-CM

## 2021-04-18 DIAGNOSIS — Z79899 Other long term (current) drug therapy: Secondary | ICD-10-CM | POA: Diagnosis not present

## 2021-04-18 DIAGNOSIS — I4510 Unspecified right bundle-branch block: Secondary | ICD-10-CM | POA: Diagnosis present

## 2021-04-18 HISTORY — PX: TRANSCATHETER AORTIC VALVE REPLACEMENT, TRANSFEMORAL: SHX6400

## 2021-04-18 HISTORY — PX: TEE WITHOUT CARDIOVERSION: SHX5443

## 2021-04-18 HISTORY — DX: Presence of prosthetic heart valve: Z95.2

## 2021-04-18 LAB — PROTIME-INR
INR: 1.1 (ref 0.8–1.2)
Prothrombin Time: 14.1 seconds (ref 11.4–15.2)

## 2021-04-18 LAB — ECHO TEE
AR max vel: 2.43 cm2
AV Area VTI: 2.32 cm2
AV Area mean vel: 2.74 cm2
AV Mean grad: 3.2 mmHg
AV Peak grad: 6.7 mmHg
Ao pk vel: 1.29 m/s

## 2021-04-18 LAB — POCT ACTIVATED CLOTTING TIME
Activated Clotting Time: 146 seconds
Activated Clotting Time: 134 seconds
Activated Clotting Time: 392 seconds

## 2021-04-18 LAB — POCT I-STAT, CHEM 8
BUN: 29 mg/dL — ABNORMAL HIGH (ref 8–23)
Calcium, Ion: 1.2 mmol/L (ref 1.15–1.40)
Chloride: 100 mmol/L (ref 98–111)
Creatinine, Ser: 1.1 mg/dL (ref 0.61–1.24)
Glucose, Bld: 118 mg/dL — ABNORMAL HIGH (ref 70–99)
HCT: 42 % (ref 39.0–52.0)
Hemoglobin: 14.3 g/dL (ref 13.0–17.0)
Potassium: 4.6 mmol/L (ref 3.5–5.1)
Sodium: 135 mmol/L (ref 135–145)
TCO2: 27 mmol/L (ref 22–32)

## 2021-04-18 LAB — GLUCOSE, CAPILLARY: Glucose-Capillary: 191 mg/dL — ABNORMAL HIGH (ref 70–99)

## 2021-04-18 LAB — BLOOD GAS, ARTERIAL
Acid-base deficit: 0.2 mmol/L (ref 0.0–2.0)
Bicarbonate: 23.3 mmol/L (ref 20.0–28.0)
Drawn by: 39681
FIO2: 21
O2 Saturation: 98.1 %
Patient temperature: 37
pCO2 arterial: 34.2 mmHg (ref 32.0–48.0)
pH, Arterial: 7.448 (ref 7.350–7.450)
pO2, Arterial: 102 mmHg (ref 83.0–108.0)

## 2021-04-18 SURGERY — IMPLANTATION, AORTIC VALVE, TRANSCATHETER, FEMORAL APPROACH
Anesthesia: General | Site: Chest

## 2021-04-18 MED ORDER — PROPOFOL 10 MG/ML IV BOLUS
INTRAVENOUS | Status: DC | PRN
  Administered 2021-04-18: 50 mg via INTRAVENOUS
  Administered 2021-04-18: 150 mg via INTRAVENOUS

## 2021-04-18 MED ORDER — ONDANSETRON HCL 4 MG/2ML IJ SOLN
INTRAMUSCULAR | Status: DC | PRN
Start: 2021-04-18 — End: 2021-04-18
  Administered 2021-04-18: 4 mg via INTRAVENOUS

## 2021-04-18 MED ORDER — SODIUM CHLORIDE 0.9 % IV SOLN: 250.0000 mL | INTRAVENOUS | Status: DC | PRN

## 2021-04-18 MED ORDER — SODIUM CHLORIDE 0.9% FLUSH: 3.0000 mL | INTRAVENOUS | Status: DC | PRN

## 2021-04-18 MED ORDER — HEPARIN (PORCINE) IN NACL 1000-0.9 UT/500ML-% IV SOLN
INTRAVENOUS | Status: DC | PRN
  Administered 2021-04-18 (×2): 500 mL

## 2021-04-18 MED ORDER — ACETAMINOPHEN 500 MG PO TABS
1000.0000 mg | ORAL_TABLET | Freq: Once | ORAL | Status: AC
  Administered 2021-04-18: 1000 mg via ORAL
  Filled 2021-04-18 (×2): qty 2

## 2021-04-18 MED ORDER — CEFAZOLIN SODIUM-DEXTROSE 2-4 GM/100ML-% IV SOLN
2.0000 g | Freq: Three times a day (TID) | INTRAVENOUS | Status: AC
  Administered 2021-04-18 – 2021-04-19 (×2): 2 g via INTRAVENOUS
  Filled 2021-04-18 (×2): qty 100

## 2021-04-18 MED ORDER — CHLORHEXIDINE GLUCONATE 4 % EX LIQD
30.0000 mL | CUTANEOUS | Status: DC
  Filled 2021-04-18: qty 30

## 2021-04-18 MED ORDER — HEPARIN (PORCINE) IN NACL 1000-0.9 UT/500ML-% IV SOLN
INTRAVENOUS | Status: AC
  Filled 2021-04-18: qty 500

## 2021-04-18 MED ORDER — IOHEXOL 350 MG/ML SOLN
INTRAVENOUS | Status: DC | PRN
  Administered 2021-04-18: 85 mL

## 2021-04-18 MED ORDER — FENTANYL CITRATE (PF) 250 MCG/5ML IJ SOLN
INTRAMUSCULAR | Status: DC | PRN
  Administered 2021-04-18: 100 ug via INTRAVENOUS
  Administered 2021-04-18: 50 ug via INTRAVENOUS

## 2021-04-18 MED ORDER — ACETAMINOPHEN 650 MG RE SUPP: 650.0000 mg | Freq: Four times a day (QID) | RECTAL | Status: DC | PRN

## 2021-04-18 MED ORDER — LACTATED RINGERS IV SOLN: INTRAVENOUS | Status: DC | PRN

## 2021-04-18 MED ORDER — SODIUM CHLORIDE 0.9 % IV SOLN: INTRAVENOUS | Status: AC

## 2021-04-18 MED ORDER — HYDROCODONE-ACETAMINOPHEN 5-325 MG PO TABS: 1.0000 | ORAL_TABLET | Freq: Four times a day (QID) | ORAL | Status: DC | PRN

## 2021-04-18 MED ORDER — CHLORHEXIDINE GLUCONATE CLOTH 2 % EX PADS
6.0000 | MEDICATED_PAD | Freq: Every day | CUTANEOUS | Status: DC
  Administered 2021-04-18 – 2021-04-20 (×2): 6 via TOPICAL

## 2021-04-18 MED ORDER — CHLORHEXIDINE GLUCONATE 0.12 % MT SOLN
15.0000 mL | Freq: Once | OROMUCOSAL | Status: AC
  Administered 2021-04-18: 15 mL via OROMUCOSAL
  Filled 2021-04-18 (×2): qty 15

## 2021-04-18 MED ORDER — METHOCARBAMOL 500 MG PO TABS: 500.0000 mg | ORAL_TABLET | Freq: Four times a day (QID) | ORAL | Status: DC | PRN

## 2021-04-18 MED ORDER — ALPRAZOLAM 0.5 MG PO TABS: 1.0000 mg | ORAL_TABLET | Freq: Every evening | ORAL | Status: DC | PRN

## 2021-04-18 MED ORDER — CHLORHEXIDINE GLUCONATE 4 % EX LIQD
60.0000 mL | Freq: Once | CUTANEOUS | Status: DC
  Filled 2021-04-18: qty 60

## 2021-04-18 MED ORDER — ASPIRIN 81 MG PO CHEW
81.0000 mg | CHEWABLE_TABLET | Freq: Every day | ORAL | Status: DC
  Administered 2021-04-19 – 2021-04-20 (×2): 81 mg via ORAL
  Filled 2021-04-18 (×2): qty 1

## 2021-04-18 MED ORDER — SODIUM CHLORIDE 0.9% FLUSH
3.0000 mL | Freq: Two times a day (BID) | INTRAVENOUS | Status: DC
  Administered 2021-04-18 – 2021-04-19 (×3): 3 mL via INTRAVENOUS

## 2021-04-18 MED ORDER — ROCURONIUM BROMIDE 10 MG/ML (PF) SYRINGE
PREFILLED_SYRINGE | INTRAVENOUS | Status: DC | PRN
  Administered 2021-04-18: 50 mg via INTRAVENOUS
  Administered 2021-04-18: 30 mg via INTRAVENOUS
  Administered 2021-04-18: 20 mg via INTRAVENOUS

## 2021-04-18 MED ORDER — SODIUM CHLORIDE 0.9 % IV SOLN: INTRAVENOUS | Status: DC

## 2021-04-18 MED ORDER — NITROGLYCERIN IN D5W 200-5 MCG/ML-% IV SOLN: 0.0000 ug/min | INTRAVENOUS | Status: DC

## 2021-04-18 MED ORDER — MIDAZOLAM HCL 2 MG/2ML IJ SOLN
INTRAMUSCULAR | Status: DC | PRN
Start: 2021-04-18 — End: 2021-04-18
  Administered 2021-04-18 (×2): 1 mg via INTRAVENOUS

## 2021-04-18 MED ORDER — DEXAMETHASONE SODIUM PHOSPHATE 10 MG/ML IJ SOLN
INTRAMUSCULAR | Status: DC | PRN
  Administered 2021-04-18: 10 mg via INTRAVENOUS

## 2021-04-18 MED ORDER — SUGAMMADEX SODIUM 200 MG/2ML IV SOLN
INTRAVENOUS | Status: DC | PRN
Start: 2021-04-18 — End: 2021-04-18
  Administered 2021-04-18: 400 mg via INTRAVENOUS

## 2021-04-18 MED ORDER — ONDANSETRON HCL 4 MG/2ML IJ SOLN
4.0000 mg | Freq: Four times a day (QID) | INTRAMUSCULAR | Status: DC | PRN
  Administered 2021-04-19 – 2021-04-20 (×2): 4 mg via INTRAVENOUS
  Filled 2021-04-18 (×2): qty 2

## 2021-04-18 MED ORDER — SODIUM CHLORIDE 0.9 % IV SOLN
250.0000 mL | INTRAVENOUS | Status: DC
Start: 2021-04-18 — End: 2021-04-20
  Administered 2021-04-18: 250 mL via INTRAVENOUS

## 2021-04-18 MED ORDER — HEPARIN SODIUM (PORCINE) 1000 UNIT/ML IJ SOLN
INTRAMUSCULAR | Status: DC | PRN
  Administered 2021-04-18: 32000 [IU] via INTRAVENOUS

## 2021-04-18 MED ORDER — MORPHINE SULFATE (PF) 2 MG/ML IV SOLN: 1.0000 mg | INTRAVENOUS | Status: DC | PRN

## 2021-04-18 MED ORDER — SODIUM CHLORIDE 0.9 % IV SOLN
INTRAVENOUS | Status: AC | PRN
  Administered 2021-04-18: 10 mL/h via INTRAVENOUS

## 2021-04-18 MED ORDER — SUCCINYLCHOLINE CHLORIDE 200 MG/10ML IV SOSY
PREFILLED_SYRINGE | INTRAVENOUS | Status: DC | PRN
  Administered 2021-04-18: 160 mg via INTRAVENOUS

## 2021-04-18 MED ORDER — ACETAMINOPHEN 325 MG PO TABS: 650.0000 mg | ORAL_TABLET | Freq: Four times a day (QID) | ORAL | Status: DC | PRN

## 2021-04-18 MED ORDER — PROTAMINE SULFATE 10 MG/ML IV SOLN
INTRAVENOUS | Status: DC | PRN
  Administered 2021-04-18: 320 mg via INTRAVENOUS

## 2021-04-18 MED ORDER — PHENYLEPHRINE HCL-NACL 20-0.9 MG/250ML-% IV SOLN: 0.0000 ug/min | INTRAVENOUS | Status: DC

## 2021-04-18 MED ORDER — NITROGLYCERIN 0.2 MG/ML ON CALL CATH LAB
INTRAVENOUS | Status: DC | PRN
  Administered 2021-04-18: 20 ug via INTRAVENOUS

## 2021-04-18 MED ORDER — LIDOCAINE 2% (20 MG/ML) 5 ML SYRINGE
INTRAMUSCULAR | Status: DC | PRN
Start: 2021-04-18 — End: 2021-04-18
  Administered 2021-04-18: 100 mg via INTRAVENOUS

## 2021-04-18 MED ORDER — ATORVASTATIN CALCIUM 10 MG PO TABS
10.0000 mg | ORAL_TABLET | Freq: Every day | ORAL | Status: DC
  Administered 2021-04-18 – 2021-04-20 (×3): 10 mg via ORAL
  Filled 2021-04-18 (×3): qty 1

## 2021-04-18 SURGICAL SUPPLY — 32 items
BAG SNAP BAND KOVER 36X36 (MISCELLANEOUS) ×6 IMPLANT
BLANKET WARM UNDERBOD FULL ACC (MISCELLANEOUS) ×3 IMPLANT
CABLE ADAPT PACING TEMP 12FT (ADAPTER) ×3 IMPLANT
CATH 29 EDWARDS DELIVERY SYS (CATHETERS) ×3 IMPLANT
CATH DIAG 6FR PIGTAIL ANGLED (CATHETERS) ×6 IMPLANT
CATH INFINITI 6F AL2 (CATHETERS) ×3 IMPLANT
CATH S G BIP PACING (CATHETERS) ×3 IMPLANT
CLOSURE MYNX CONTROL 6F/7F (Vascular Products) ×3 IMPLANT
CLOSURE PERCLOSE PROSTYLE (VASCULAR PRODUCTS) ×9 IMPLANT
CRIMPER (MISCELLANEOUS) ×3 IMPLANT
DEVICE INFLATION ATRION QL38 (MISCELLANEOUS) ×3 IMPLANT
GUIDEWIRE SAFE TJ AMPLATZ EXST (WIRE) ×3 IMPLANT
KIT HEART LEFT (KITS) ×3 IMPLANT
KIT MICROPUNCTURE NIT STIFF (SHEATH) ×3 IMPLANT
NEEDLE 18GX3-1/2 9CM (NEEDLE) ×3 IMPLANT
PACK CARDIAC CATHETERIZATION (CUSTOM PROCEDURE TRAY) ×3 IMPLANT
PINNACLE LONG 6F 25CM (SHEATH) ×3
SHEATH BRITE TIP 7FR 35CM (SHEATH) ×3 IMPLANT
SHEATH EDWARDS INTRO SET 16X36 (SHEATH) ×3 IMPLANT
SHEATH INTRO PINNACLE 6F 25CM (SHEATH) ×2 IMPLANT
SHEATH PINNACLE 6F 10CM (SHEATH) ×3 IMPLANT
SHEATH PINNACLE 8F 10CM (SHEATH) ×3 IMPLANT
SHEATH PROBE COVER 6X72 (BAG) ×3 IMPLANT
SLEEVE REPOSITIONING LENGTH 30 (MISCELLANEOUS) ×3 IMPLANT
STOPCOCK MORSE 400PSI 3WAY (MISCELLANEOUS) ×6 IMPLANT
SYR MEDRAD MARK V 150ML (SYRINGE) ×3 IMPLANT
TRANSDUCER W/STOPCOCK (MISCELLANEOUS) ×6 IMPLANT
VALVE HEART TRANSCATH SZ3 29MM (Valve) ×3 IMPLANT
WIRE AMPLATZ SS-J .035X180CM (WIRE) ×3 IMPLANT
WIRE EMERALD 3MM-J .035X150CM (WIRE) ×6 IMPLANT
WIRE EMERALD 3MM-J .035X260CM (WIRE) ×3 IMPLANT
WIRE EMERALD ST .035X260CM (WIRE) ×3 IMPLANT

## 2021-04-18 NOTE — Progress Notes (Signed)
  [facility]  Patient doing well s/p TAVR. He is hemodynamically stable. Groin sites stable, temp wire in left groin. ECG with paced rhythm. He has underlying CHB and pacer dependant. EP has been consulted and will see him tomorrow AM.   Angelena Form PA-C  [facility]  Pager [phone]

## 2021-04-18 NOTE — Anesthesia Procedure Notes (Signed)
Procedure Name: Intubation Date/Time: 04/18/2021 9:33 AM Performed by: Carolan Clines, CRNA Pre-anesthesia Checklist: Patient identified, Emergency Drugs available, Suction available and Patient being monitored Patient Re-evaluated:Patient Re-evaluated prior to induction Oxygen Delivery Method: Circle System Utilized Preoxygenation: Pre-oxygenation with 100% oxygen Induction Type: IV induction and Rapid sequence Ventilation: Two handed mask ventilation required and Oral airway inserted - appropriate to patient size Laryngoscope Size: Glidescope and 4 Grade View: Grade I Tube type: Oral Tube size: 7.5 mm Number of attempts: 1 Airway Equipment and Method: Rigid stylet, Oral airway and Video-laryngoscopy Placement Confirmation: ETT inserted through vocal cords under direct vision, positive ETCO2 and breath sounds checked- equal and bilateral Secured at: 23 cm Tube secured with: Tape Dental Injury: Teeth and Oropharynx as per pre-operative assessment  Difficulty Due To: Difficulty was anticipated Comments: Elective glidescope intubation r/t body habitus

## 2021-04-18 NOTE — Progress Notes (Signed)
Patient ID: David [name], male   DOB: 1955-12-18, 65 y.o.   MRN: [id number] TCTS Evening Rounds:  Hemodynamically stable.  VVI paced at 67 for CHB.  Awake and alert, neuro intact. No complaints.  Groins look ok.

## 2021-04-18 NOTE — Interval H&P Note (Signed)
History and Physical Interval Note:  04/18/2021 8:05 AM  Fredric Dine  has presented today for surgery, with the diagnosis of Severe Aortic Stenosis.  The various methods of treatment have been discussed with the patient and family. After consideration of risks, benefits and other options for treatment, the patient has consented to  Procedure(s): TRANSCATHETER AORTIC VALVE REPLACEMENT, TRANSFEMORAL (N/A) TRANSESOPHAGEAL ECHOCARDIOGRAM (TEE) (N/A) as a surgical intervention.  The patient's history has been reviewed, patient examined, no change in status, stable for surgery.  I have reviewed the patient's chart and labs.  Questions were answered to the patient's satisfaction.     Gaye Pollack

## 2021-04-18 NOTE — Anesthesia Postprocedure Evaluation (Signed)
Anesthesia Post Note  Patient: TYREQUE SCHNURR  Procedure(s) Performed: TRANSCATHETER AORTIC VALVE REPLACEMENT, TRANSFEMORAL (Chest) TRANSESOPHAGEAL ECHOCARDIOGRAM (TEE)     Patient location during evaluation: PACU Anesthesia Type: General Level of consciousness: sedated Pain management: pain level controlled Vital Signs Assessment: post-procedure vital signs reviewed and stable Respiratory status: spontaneous breathing and respiratory function stable Cardiovascular status: stable Postop Assessment: no apparent nausea or vomiting Anesthetic complications: no   No notable events documented.  Last Vitals:  Vitals:   04/18/21 0721 04/18/21 0936  BP: (!) 150/62   Pulse: (!) 104 89  Resp: 20   Temp: 37.3 C   SpO2: 95%     Last Pain:  Vitals:   04/18/21 0827  TempSrc:   PainSc: 1                  Mayerli Kirst DANIEL

## 2021-04-18 NOTE — Anesthesia Procedure Notes (Signed)
Arterial Line Insertion Start/End8/30/2022 8:35 AM Performed by: Carolan Clines, CRNA, CRNA  Patient location: Pre-op. Preanesthetic checklist: patient identified, IV checked, site marked, risks and benefits discussed, surgical consent, monitors and equipment checked, pre-op evaluation, timeout performed and anesthesia consent Lidocaine 1% used for infiltration Right, radial was placed Catheter size: 20 G Hand hygiene performed  and maximum sterile barriers used   Attempts: 1 Procedure performed without using ultrasound guided technique. Following insertion, dressing applied and Biopatch. Post procedure assessment: normal and unchanged  Patient tolerated the procedure well with no immediate complications.

## 2021-04-18 NOTE — Progress Notes (Signed)
Dr. Angelena Form notified about left groin.

## 2021-04-18 NOTE — Discharge Instructions (Addendum)
ACTIVITY AND EXERCISE  Daily activity and exercise are an important part of your recovery. People recover at different rates depending on their general health and type of valve procedure.  Most people recovering from TAVR feel better relatively quickly   No lifting, pushing, pulling more than 10 pounds (examples to avoid: groceries, vacuuming, gardening, golfing):             - For one week with a procedure through the groin.             - For six weeks for procedures through the chest wall or neck. NOTE: You will typically see one of our providers 7-14 days after your procedure to discuss [place] the above activities.      DRIVING  Do not drive until you are seen for follow up and cleared by a provider. Generally, we ask patient to not drive for 1 week after their procedure.  If you have been told by your doctor in the past that you may not drive, you must talk with him/her before you begin driving again.   DRESSING  Groin site: you may leave the clear dressing over the site for up to one week or until it falls off.   HYGIENE  If you had a femoral (leg) procedure, you may take a shower when you return home. After the shower, pat the site dry. Do NOT use powder, oils or lotions in your groin area until the site has completely healed.  If you had a chest procedure, you may shower when you return home unless specifically instructed not to by your discharging practitioner.             - DO NOT scrub incision; pat dry with a towel.             - DO NOT apply any lotions, oils, powders to the incision.             - No tub baths / swimming for at least 2 weeks.  If you notice any fevers, chills, increased pain, swelling, bleeding or pus, please contact your doctor.   ADDITIONAL INFORMATION  If you are going to have an upcoming dental procedure, please contact our office as you will require antibiotics ahead of time to prevent infection on your heart valve.    If you have any questions  or concerns you can call the structural heart phone during normal business hours 8am-4pm. If you have an urgent need after hours or weekends please call [phone] to talk to the on call provider for general cardiology. If you have an emergency that requires immediate attention, please call 911.    After TAVR Checklist  Check  Test Description   Follow up appointment in 1-2 weeks  You will see our structural heart physician assistant, Nell Range. Your incision sites will be checked and you will be cleared to drive and resume all normal activities if you are doing well.     1 month echo and follow up  You will have an echo to check on your new heart valve and be seen back in the office by Nell Range. Many times the echo is not read by your appointment time, but Joellen Jersey will call you later that day or the following day to report your results.   Follow up with your primary cardiologist You will need to be seen by your primary cardiologist in the following 3-6 months after your 1 month appointment in the valve  clinic. Often times your Plavix or Aspirin will be discontinued during this time, but this is decided on a case by case basis.    1 year echo and follow up You will have another echo to check on your heart valve after 1 year and be seen back in the office by Nell Range. This your last structural heart visit.   Bacterial endocarditis prophylaxis  You will have to take antibiotics for the rest of your life before all dental procedures (even teeth cleanings) to protect your heart valve. Antibiotics are also required before some surgeries. Please check with your cardiologist before scheduling any surgeries. Also, please make sure to tell us if you have a penicillin allergy as you will require an alternative antibiotic.    _________________________      Supplemental Discharge Instructions for  Pacemaker/Defibrillator Patients   Activity No heavy lifting or vigorous activity with your  left/right arm for 6 to 8 weeks.  Do not raise your left/right arm above your head for one week.  Gradually raise your affected arm as drawn below.              04/24/21                      04/25/21                     04/26/21                    04/27/21 __  NO DRIVING until cleared to at your wound check visit.  WOUND CARE Keep the wound area clean and dry.  Do not get this area wet , no showers until cleared to at your wound check visit. The tape/steri-strips on your wound will fall off; do not pull them off.  No bandage is needed on the site.  DO  NOT apply any creams, oils, or ointments to the wound area. If you notice any drainage or discharge from the wound, any swelling or bruising at the site, or you develop a fever > 101? F after you are discharged home, call the office at once.  Special Instructions You are still able to use cellular telephones; use the ear opposite the side where you have your pacemaker/defibrillator.  Avoid carrying your cellular phone near your device. When traveling through airports, show security personnel your identification card to avoid being screened in the metal detectors.  Ask the security personnel to use the hand wand. Avoid arc welding equipment, MRI testing (magnetic resonance imaging), TENS units (transcutaneous nerve stimulators).  Call the office for questions about other devices. Avoid electrical appliances that are in poor condition or are not properly grounded. Microwave ovens are safe to be near or to operate.

## 2021-04-18 NOTE — Op Note (Signed)
[facility]   MULTIDISCIPLINARY HEART VALVE TEAM   TAVR OPERATIVE NOTE   Date of Procedure:  04/18/2021  Preoperative Diagnosis: Severe Aortic Stenosis   Postoperative Diagnosis: Same   Procedure:   Transcatheter Aortic Valve Replacement - Percutaneous Right Transfemoral Approach  Edwards Sapien 3  THV (size 29 mm, model # 9600TFX, serial # [id number])   Co-Surgeons:  Gaye Pollack, MD and Lauree Chandler, MD   Anesthesiologist:  Tamela Gammon, MD  Echocardiographer:  Sanda Klein, MD  Pre-operative Echo Findings: Severe aortic stenosis Normal left ventricular systolic function  Post-operative Echo Findings: trivial paravalvular leak Normal left ventricular systolic function   BRIEF CLINICAL NOTE AND INDICATIONS FOR SURGERY  This 65 year old gentleman has stage D, severe, symptomatic aortic stenosis with near Heart Association class III symptoms of exertional fatigue and shortness of breath consistent with chronic diastolic congestive heart failure.  I personally reviewed his 2D echocardiogram, cardiac catheterization, and CTA studies.  His echocardiogram shows a trileaflet aortic valve with severe thickening and calcification of the leaflets with restricted mobility.  The mean gradient is 49 mmHg consistent with severe aortic stenosis.  Left ventricular systolic function is normal.  Cardiac catheterization shows no evidence of coronary disease.  The mean gradient across aortic valve was 41.3 mmHg with a peak to peak gradient of 62 mmHg consistent with severe aortic stenosis.  I agree that aortic valve replacement is indicated in this patient for relief of his progressive symptoms and to prevent progressive left ventricular deterioration.  Given his weight of 425 pounds and comorbid risk factors I think it is reasonable to recommend transcatheter aortic valve replacement instead of open surgical aortic valve replacement in this patient.  His weight and BSA puts  him at the upper limits of suitability for cardiopulmonary bypass and I think his risk with open surgery would be significant with a very slow recovery.  His gated cardiac CTA shows anatomy suitable for TAVR using a 29 mm SAPIEN 3 valve.  His abdominal and pelvic CTA is poor quality but I think there is probably adequate pelvic vascular anatomy to allow transfemoral insertion.     The patient was counseled at length regarding treatment alternatives for management of severe symptomatic aortic stenosis. The risks and benefits of surgical intervention has been discussed in detail. Long-term prognosis with medical therapy was discussed. Alternative approaches such as conventional surgical aortic valve replacement, transcatheter aortic valve replacement, and palliative medical therapy were compared and contrasted at length. This discussion was placed in the context of the patient's own specific clinical presentation and past medical history. All of his questions have been addressed.    Following the decision to proceed with transcatheter aortic valve replacement, a discussion was held regarding what types of management strategies would be attempted intraoperatively in the event of life-threatening complications, including whether or not the patient would be considered a candidate for the use of cardiopulmonary bypass and/or conversion to open sternotomy for attempted surgical intervention.  Given his relatively young age I think he would be a candidate for emergent sternotomy to manage any intraoperative complications although at a significantly increased risk due to his super morbid obesity.  The patient is aware of the fact that transient use of cardiopulmonary bypass may be necessary. The patient has been advised of a variety of complications that might develop including but not limited to risks of death, stroke, paravalvular leak, aortic dissection or other major vascular complications, aortic annulus rupture,  device embolization, cardiac rupture  or perforation, mitral regurgitation, acute myocardial infarction, arrhythmia, heart block or bradycardia requiring permanent pacemaker placement, congestive heart failure, respiratory failure, renal failure, pneumonia, infection, other late complications related to structural valve deterioration or migration, or other complications that might ultimately cause a temporary or permanent loss of functional independence or other long term morbidity. The patient provides full informed consent for the procedure as described and all questions were answered.     DETAILS OF THE OPERATIVE PROCEDURE  PREPARATION:    The patient was brought to the operating room on the above mentioned date and appropriate monitoring was established by the anesthesia team. The patient was placed in the supine position on the operating table.  Intravenous antibiotics were administered.  General endotracheal anesthesia was induced uneventfully.    Baseline transesophageal echocardiogram was performed. The patient's abdomen and both groins were prepped and draped in a sterile manner. A time out procedure was performed.   PERIPHERAL ACCESS:    Using the modified Seldinger technique, femoral arterial and venous access was obtained with placement of 6 Fr sheaths on the left side.  A pigtail diagnostic catheter was passed through the left arterial sheath under fluoroscopic guidance into the aortic root.  A temporary transvenous pacemaker catheter was passed through the left femoral venous sheath under fluoroscopic guidance into the right ventricle.  The pacemaker was tested to ensure stable lead placement and pacemaker capture. Aortic root angiography was performed in order to determine the optimal angiographic angle for valve deployment.   TRANSFEMORAL ACCESS:   Percutaneous transfemoral access and sheath placement was performed using ultrasound guidance.  The right common femoral artery was  cannulated using a micropuncture needle and appropriate location was verified using hand injection angiogram.  A pair of Abbott Perclose percutaneous closure devices were placed and a 6 French sheath replaced into the femoral artery.  The patient was heparinized systemically and ACT verified > 250 seconds.    A 16 Fr transfemoral E-sheath was introduced into the right common femoral artery after progressively dilating over an Amplatz superstiff wire. An AL-2 catheter was used to direct a straight-tip exchange length wire across the native aortic valve into the left ventricle. This was exchanged out for a pigtail catheter and position was confirmed in the LV apex.  The pigtail catheter was exchanged for an Amplatz Extra-stiff wire in the LV apex.    BALLOON AORTIC VALVULOPLASTY:   Not performed   TRANSCATHETER HEART VALVE DEPLOYMENT:   An Edwards Sapien 3 Ultra transcatheter heart valve (size 29 mm) was prepared and crimped per manufacturer's guidelines, and the proper orientation of the valve is confirmed on the [employer] delivery system. The valve was advanced through the introducer sheath using normal technique until in an appropriate position in the abdominal aorta beyond the sheath tip. The balloon was then retracted and using the fine-tuning wheel was centered on the valve. The valve was then advanced across the aortic arch using appropriate flexion of the catheter. The valve was carefully positioned across the aortic valve annulus. The Commander catheter was retracted using normal technique. Once final position of the valve has been confirmed by angiographic assessment, the valve is deployed while temporarily holding ventilation and during rapid ventricular pacing to maintain systolic blood pressure < 50 mmHg and pulse pressure < 10 mmHg. The balloon inflation is held for >3 seconds after reaching full deployment volume. Once the balloon has fully deflated the balloon is retracted into the  ascending aorta and valve function is assessed  using echocardiography. There is felt to be trivial paravalvular leak and no central aortic insufficiency.  The patient's hemodynamic recovery following valve deployment is good.  The deployment balloon and guidewire are both removed.    PROCEDURE COMPLETION:   The sheath was removed and femoral artery closure performed.  Protamine was administered once femoral arterial repair was complete. The temporary pacemaker, pigtail catheters and femoral sheaths were removed with manual pressure used for hemostasis.  A Mynx femoral closure device was utilized following removal of the diagnostic sheath in the left femoral artery.  The patient tolerated the procedure well and is transported to the cath lab recovery area in stable condition. There were no immediate intraoperative complications. All sponge instrument and needle counts are verified correct at completion of the operation.   No blood products were administered during the operation.  The patient received a total of 85 mL of intravenous contrast during the procedure.   Gaye Pollack, MD 04/18/2021 3:45 PM

## 2021-04-18 NOTE — CV Procedure (Signed)
[facility]  TAVR OPERATIVE NOTE   Date of Procedure:  04/18/2021  Preoperative Diagnosis: Severe Aortic Stenosis   Postoperative Diagnosis: Same   Procedure:   Transcatheter Aortic Valve Replacement - Transfemoral Approach  Edwards Sapien 3 THV (size 29 mm, model #[id number] serial # [id number])   Co-Surgeons:  Lauree Chandler, MD and Gaye Pollack, MD  Anesthesiologist:  Tobias Alexander  Echocardiographer:  Croitoru  Pre-operative Echo Findings: Severe aortic stenosis Normal left ventricular systolic function  Post-operative Echo Findings: Trivial to mild paravalvular leak Normal left ventricular systolic function  BRIEF CLINICAL NOTE AND INDICATIONS FOR SURGERY  65 yo male with history of HTN, hyperlipidemia, morbid obesity, paroxysmal atrial fibrillation, sleep apnea, former tobacco abuse and severe aortic stenosis who is here today as a new consult, referred by Dr. Debara Pickett, for further discussion regarding his aortic stenosis and AVR vs TAVR. He has paroxysmal atrial fibrillation and is on Xarelto. He has been followed for moderate aortic stenosis by Dr. Debara Pickett. Echo 03/03/21 with LVEF=60-65%, moderate LVH. The aortic valve leaflets are thickened and calcified with limited leaflet mobility. There is severe aortic valve stenosis with mean gradient 49 mmHg, peak gradient 82 mmHg, AVA 0.67 cm2, dimensionless index 0.18. Cardiac cath with no evidence of CAD. Severe AS with mean gradient 41 mmHg.   During the course of the patient's preoperative work up they have been evaluated comprehensively by a multidisciplinary team of specialists coordinated through the [facility] in the [facility].  They have been demonstrated to suffer from symptomatic severe aortic stenosis as noted above. The patient has been counseled extensively as to the relative risks and benefits of all options for the treatment of severe aortic stenosis  including long term medical therapy, conventional surgery for aortic valve replacement, and transcatheter aortic valve replacement.  The patient has been independently evaluated by Dr. Cyndia Bent with CT surgery and they are felt to be at high risk for conventional surgical aortic valve replacement. The surgeon indicated the patient would be a poor candidate for conventional surgery. Based upon review of all of the patient's preoperative diagnostic tests they are felt to be candidate for transcatheter aortic valve replacement using the transfemoral approach as an alternative to high risk conventional surgery.    Following the decision to proceed with transcatheter aortic valve replacement, a discussion has been held regarding what types of management strategies would be attempted intraoperatively in the event of life-threatening complications, including whether or not the patient would be considered a candidate for the use of cardiopulmonary bypass and/or conversion to open sternotomy for attempted surgical intervention.  The patient has been advised of a variety of complications that might develop peculiar to this approach including but not limited to risks of death, stroke, paravalvular leak, aortic dissection or other major vascular complications, aortic annulus rupture, device embolization, cardiac rupture or perforation, acute myocardial infarction, arrhythmia, heart block or bradycardia requiring permanent pacemaker placement, congestive heart failure, respiratory failure, renal failure, pneumonia, infection, other late complications related to structural valve deterioration or migration, or other complications that might ultimately cause a temporary or permanent loss of functional independence or other long term morbidity.  The patient provides full informed consent for the procedure as described and all questions were answered preoperatively.    DETAILS OF THE OPERATIVE PROCEDURE  PREPARATION:   The  patient is brought to the operating room on the above mentioned date and central monitoring was established by the anesthesia team  including placement of a radial arterial line. The patient is placed in the supine position on the operating table.  Intravenous antibiotics are administered. General anesthesia is used.   Baseline transesophageal echocardiogram was performed. The patient's chest, abdomen, both groins, and both lower extremities are prepared and draped in a sterile manner. A time out procedure is performed.   PERIPHERAL ACCESS:   Using the modified Seldinger technique, femoral arterial and venous access were obtained with placement of a 6 Fr sheath in the artery and a 7 Fr sheath in the vein on the left side using u/s guidance.  A pigtail diagnostic catheter was passed through the femoral arterial sheath under fluoroscopic guidance into the aortic root.  A temporary transvenous pacemaker catheter was passed through the femoral venous sheath under fluoroscopic guidance into the right ventricle.  The pacemaker was tested to ensure stable lead placement and pacemaker capture. Aortic root angiography was performed in order to determine the optimal angiographic angle for valve deployment.  TRANSFEMORAL ACCESS:  Access to the right femoral artery using u/s guidance.  Pre-closure with double ProGlide closure devices. The patient was heparinized systemically and ACT verified > 250 seconds.    A 16 Fr transfemoral E-sheath was introduced into the rigth femoral artery after progressively dilating over an Amplatz superstiff wire. An AL-2 catheter was used to direct a J wire across the native aortic valve into the left ventricle. This was exchanged out for a pigtail catheter and position was confirmed in the LV apex.   The pigtail catheter was then exchanged for an Amplatz Extra-stiff wire in the LV apex.   TRANSCATHETER HEART VALVE DEPLOYMENT:  An Edwards Sapien 3 THV (size 29 mm) was prepared and  crimped per manufacturer's guidelines, and the proper orientation of the valve is confirmed on the [employer] delivery system. The valve was advanced through the introducer sheath using normal technique until in an appropriate position in the abdominal aorta beyond the sheath tip. The balloon was then retracted and using the fine-tuning wheel was centered on the valve. The valve was then advanced across the aortic arch using appropriate flexion of the catheter. The valve was carefully positioned across the aortic valve annulus. The Commander catheter was retracted using normal technique. Once final position of the valve has been confirmed by angiographic assessment, the valve is deployed while temporarily holding ventilation and during rapid ventricular pacing to maintain systolic blood pressure < 50 mmHg and pulse pressure < 10 mmHg. The balloon inflation is held for >3 seconds after reaching full deployment volume. Once the balloon has fully deflated the balloon is retracted into the ascending aorta and valve function is assessed using TEE. There is felt to be trivial to mild paravalvular leak and no central aortic insufficiency.  The patient's hemodynamic recovery following valve deployment is good.  Given mild AI, we placed 2 additional cc of saline in the balloon and performed another balloon inflation with rapid pacing. The deployment balloon and guidewire are both removed. Echo demostrated acceptable post-procedural gradients, stable mitral valve function, and trivial to mild AI.   PROCEDURE COMPLETION:  The sheath was then removed and closure devices were completed. Protamine was administered once femoral arterial repair was complete. The temporary pacemaker was left in place due to heart block. The pigtail catheter and femoral arterial sheath was removed with a Mynx closure device placed in the femoral artery.     The patient tolerated the procedure well and is transported to the surgical  intensive  care in stable condition. There were no immediate intraoperative complications. All sponge instrument and needle counts are verified correct at completion of the operation.   No blood products were administered during the operation.  The patient received a total of 85 mL of intravenous contrast during the procedure.  Lauree Chandler MD 04/18/2021 9:38 AM

## 2021-04-18 NOTE — Transfer of Care (Signed)
Immediate Anesthesia Transfer of Care Note  Patient: David [name]  Procedure(s) Performed: TRANSCATHETER AORTIC VALVE REPLACEMENT, TRANSFEMORAL (Chest) TRANSESOPHAGEAL ECHOCARDIOGRAM (TEE)  Patient Location: ICU  Anesthesia Type:General  Level of Consciousness: awake, alert  and oriented  Airway & Oxygen Therapy: Patient Spontanous Breathing and Patient connected to face mask oxygen  Post-op Assessment: Report given to RN and Post -op Vital signs reviewed and stable  Post vital signs: Reviewed and stable  Last Vitals: see ICU flowsheet Vitals Value Taken Time  BP    Temp    Pulse    Resp    SpO2      Last Pain:  Vitals:   04/18/21 0827  TempSrc:   PainSc: 1       Patients Stated Pain Goal: 4 ([id number])  Complications: No notable events documented.

## 2021-04-19 ENCOUNTER — Encounter (HOSPITAL_COMMUNITY)
Admission: RE | Disposition: A | Discharge status: Home / Self Care | Payer: Self-pay | Source: Home / Self Care | Attending: Cardiovascular Disease

## 2021-04-19 ENCOUNTER — Inpatient Hospital Stay (HOSPITAL_COMMUNITY): Payer: 59

## 2021-04-19 DIAGNOSIS — I4811 Longstanding persistent atrial fibrillation: Secondary | ICD-10-CM | POA: Diagnosis not present

## 2021-04-19 DIAGNOSIS — Z950 Presence of cardiac pacemaker: Secondary | ICD-10-CM | POA: Insufficient documentation

## 2021-04-19 DIAGNOSIS — Z952 Presence of prosthetic heart valve: Secondary | ICD-10-CM

## 2021-04-19 DIAGNOSIS — I350 Nonrheumatic aortic (valve) stenosis: Secondary | ICD-10-CM | POA: Diagnosis not present

## 2021-04-19 DIAGNOSIS — I480 Paroxysmal atrial fibrillation: Secondary | ICD-10-CM

## 2021-04-19 DIAGNOSIS — I442 Atrioventricular block, complete: Secondary | ICD-10-CM

## 2021-04-19 DIAGNOSIS — I4819 Other persistent atrial fibrillation: Secondary | ICD-10-CM | POA: Diagnosis not present

## 2021-04-19 DIAGNOSIS — Z006 Encounter for examination for normal comparison and control in clinical research program: Secondary | ICD-10-CM | POA: Diagnosis not present

## 2021-04-19 HISTORY — PX: PACEMAKER IMPLANT: EP1218

## 2021-04-19 HISTORY — DX: Presence of cardiac pacemaker: Z95.0

## 2021-04-19 LAB — CBC
HCT: 40.7 % (ref 39.0–52.0)
Hemoglobin: 13.9 g/dL (ref 13.0–17.0)
MCH: 30.9 pg (ref 26.0–34.0)
MCHC: 34.2 g/dL (ref 30.0–36.0)
MCV: 90.4 fL (ref 80.0–100.0)
Platelets: 176 10*3/uL (ref 150–400)
RBC: 4.5 MIL/uL (ref 4.22–5.81)
RDW: 14 % (ref 11.5–15.5)
WBC: 10.9 10*3/uL — ABNORMAL HIGH (ref 4.0–10.5)
nRBC: 0 % (ref 0.0–0.2)

## 2021-04-19 LAB — ECHOCARDIOGRAM COMPLETE
AR max vel: 2.1 cm2
AV Area VTI: 2.11 cm2
AV Area mean vel: 2.17 cm2
AV Mean grad: 8 mmHg
AV Peak grad: 15.7 mmHg
Ao pk vel: 1.98 m/s
Height: 75 in
S' Lateral: 3.4 cm
Weight: 6800 oz

## 2021-04-19 LAB — BASIC METABOLIC PANEL
Anion gap: 8 (ref 5–15)
BUN: 18 mg/dL (ref 8–23)
CO2: 24 mmol/L (ref 22–32)
Calcium: 9 mg/dL (ref 8.9–10.3)
Chloride: 101 mmol/L (ref 98–111)
Creatinine, Ser: 1.02 mg/dL (ref 0.61–1.24)
GFR, Estimated: >60 mL/min (ref >60)
Glucose, Bld: 157 mg/dL — ABNORMAL HIGH (ref 70–99)
Potassium: 4 mmol/L (ref 3.5–5.1)
Sodium: 133 mmol/L — ABNORMAL LOW (ref 135–145)

## 2021-04-19 LAB — POCT ACTIVATED CLOTTING TIME: Activated Clotting Time: 138 seconds

## 2021-04-19 LAB — MAGNESIUM: Magnesium: 2.1 mg/dL (ref 1.7–2.4)

## 2021-04-19 SURGERY — PACEMAKER IMPLANT

## 2021-04-19 MED ORDER — HYDROCHLOROTHIAZIDE 25 MG PO TABS
25.0000 mg | ORAL_TABLET | Freq: Every day | ORAL | Status: DC
  Administered 2021-04-19 – 2021-04-20 (×2): 25 mg via ORAL
  Filled 2021-04-19 (×2): qty 1

## 2021-04-19 MED ORDER — LIDOCAINE HCL 1 % IJ SOLN
INTRAMUSCULAR | Status: AC
  Filled 2021-04-19: qty 40

## 2021-04-19 MED ORDER — LIDOCAINE VISCOUS HCL 2 % MT SOLN
15.0000 mL | Freq: Once | OROMUCOSAL | Status: AC
  Administered 2021-04-19: 15 mL via ORAL
  Filled 2021-04-19: qty 15

## 2021-04-19 MED ORDER — FAMOTIDINE 20 MG PO TABS
20.0000 mg | ORAL_TABLET | Freq: Every day | ORAL | Status: DC
  Filled 2021-04-19: qty 1

## 2021-04-19 MED ORDER — SODIUM CHLORIDE 0.9% FLUSH
3.0000 mL | Freq: Two times a day (BID) | INTRAVENOUS | Status: DC
  Administered 2021-04-19 – 2021-04-20 (×2): 3 mL via INTRAVENOUS

## 2021-04-19 MED ORDER — HEPARIN (PORCINE) IN NACL 1000-0.9 UT/500ML-% IV SOLN
INTRAVENOUS | Status: DC | PRN
  Administered 2021-04-19: 500 mL

## 2021-04-19 MED ORDER — SODIUM CHLORIDE 0.9 % IV SOLN
80.0000 mg | INTRAVENOUS | Status: AC
  Administered 2021-04-19: 80 mg

## 2021-04-19 MED ORDER — SODIUM CHLORIDE 0.9 % IV SOLN
INTRAVENOUS | Status: AC
  Filled 2021-04-19: qty 2

## 2021-04-19 MED ORDER — SODIUM CHLORIDE 0.9% FLUSH: 3.0000 mL | INTRAVENOUS | Status: DC | PRN

## 2021-04-19 MED ORDER — IRBESARTAN 150 MG PO TABS
150.0000 mg | ORAL_TABLET | Freq: Every day | ORAL | Status: DC
  Administered 2021-04-19 – 2021-04-20 (×2): 150 mg via ORAL
  Filled 2021-04-19 (×2): qty 1

## 2021-04-19 MED ORDER — CHLORHEXIDINE GLUCONATE 4 % EX LIQD: 60.0000 mL | Freq: Once | CUTANEOUS | Status: AC

## 2021-04-19 MED ORDER — SODIUM CHLORIDE 0.9 % IV SOLN: 250.0000 mL | INTRAVENOUS | Status: DC

## 2021-04-19 MED ORDER — FAMOTIDINE 20 MG PO TABS
20.0000 mg | ORAL_TABLET | Freq: Every day | ORAL | Status: DC
Start: 2021-04-19 — End: 2021-04-20
  Administered 2021-04-19: 20 mg via ORAL
  Filled 2021-04-19: qty 1

## 2021-04-19 MED ORDER — MUPIROCIN 2 % EX OINT
TOPICAL_OINTMENT | Freq: Two times a day (BID) | CUTANEOUS | Status: DC
  Administered 2021-04-19 – 2021-04-20 (×2): 1 via NASAL
  Filled 2021-04-19: qty 22

## 2021-04-19 MED ORDER — LIDOCAINE HCL 1 % IJ SOLN
INTRAMUSCULAR | Status: AC
  Filled 2021-04-19: qty 20

## 2021-04-19 MED ORDER — LIDOCAINE HCL (PF) 1 % IJ SOLN
INTRAMUSCULAR | Status: DC | PRN
  Administered 2021-04-19: 80 mL
  Administered 2021-04-19: 20 mL

## 2021-04-19 MED ORDER — CEFAZOLIN IN SODIUM CHLORIDE 3-0.9 GM/100ML-% IV SOLN
3.0000 g | INTRAVENOUS | Status: AC
  Administered 2021-04-19: 3 g via INTRAVENOUS
  Filled 2021-04-19 (×3): qty 100

## 2021-04-19 MED ORDER — PANTOPRAZOLE SODIUM 40 MG PO TBEC
40.0000 mg | DELAYED_RELEASE_TABLET | Freq: Every day | ORAL | Status: DC
  Administered 2021-04-20: 40 mg via ORAL
  Filled 2021-04-19 (×2): qty 1

## 2021-04-19 MED ORDER — CHLORHEXIDINE GLUCONATE 4 % EX LIQD
60.0000 mL | Freq: Once | CUTANEOUS | Status: AC
Start: 2021-04-19 — End: 2021-04-19
  Administered 2021-04-19: 4 via TOPICAL
  Filled 2021-04-19: qty 60

## 2021-04-19 MED ORDER — CEFAZOLIN SODIUM-DEXTROSE 2-4 GM/100ML-% IV SOLN
2.0000 g | Freq: Three times a day (TID) | INTRAVENOUS | Status: DC
  Administered 2021-04-19 – 2021-04-20 (×2): 2 g via INTRAVENOUS
  Filled 2021-04-19 (×3): qty 100

## 2021-04-19 MED ORDER — SODIUM CHLORIDE 0.9 % IV SOLN: INTRAVENOUS | Status: DC

## 2021-04-19 MED ORDER — ALUM & MAG HYDROXIDE-SIMETH 200-200-20 MG/5ML PO SUSP
30.0000 mL | Freq: Once | ORAL | Status: AC
  Administered 2021-04-19: 30 mL via ORAL
  Filled 2021-04-19: qty 30

## 2021-04-19 SURGICAL SUPPLY — 10 items
ATTRACTOMAT 16X20 MAGNETIC DRP (DRAPES) ×1 IMPLANT
CABLE SURGICAL S-101-97-12 (CABLE) ×2 IMPLANT
IPG PACE AZUR XT DR MRI W1DR01 (Pacemaker) IMPLANT
LEAD CAPSURE NOVUS 5076-52CM (Lead) ×1 IMPLANT
LEAD CAPSURE NOVUS 5076-58CM (Lead) ×1 IMPLANT
PACE AZURE XT DR MRI W1DR01 (Pacemaker) ×2 IMPLANT
PAD PRO RADIOLUCENT 2001M-C (PAD) ×2 IMPLANT
SHEATH 7FR PRELUDE SNAP 13 (SHEATH) ×3 IMPLANT
SHEATH PROBE COVER 6X72 (BAG) ×1 IMPLANT
TRAY PACEMAKER INSERTION (PACKS) ×2 IMPLANT

## 2021-04-19 NOTE — Consult Note (Addendum)
Cardiology Consultation:   Patient ID: David [name] MRN: [id number]; DOB: 1956/02/17  Admit date: 04/18/2021 Date of Consult: 04/19/2021  PCP:  Denita Lung, MD   [facility] Providers Cardiologist:  [name], MD Structural Heart; Dr. Angelena Form   Patient Profile:   David [name] is a 65 y.o. male with a hx of AFib (described as persistent),super morbid obesity, OSA, HTN, prior stroke, VHD w/severe AS who is being seen 04/19/2021 for the evaluation of post TAVR CHB at the request of Dr. Angelena Form.  History of Present Illness:   Mr. Mccalla was admitted yesterday to undergo TAVR, he had his procedure during which he developed CHB requiring use of his temp wire.   EP is called with ongoing temp pacing wire dependent this AM.  Home meds include diltiazem and Toprol, neither of which he had yesterday.  Pre-TAVR he had RBBB, LAD  LABS K+ 4.0 Mag 2.1 BUN/Creat 18/1.02 WBC 10.9 H/H 13/40 Plts 176  Inter-op TEE yesterday post valve deployment: Normal left ventricular systolic function and regional wall motion. EF 60%  Well seated TAVR stent valve. There is mild aortic insufficiency, along  the arc between the atrial septum and left coronary ostium. Despite baloon  reinflation at larger volume, the perivalvular leak persisted unchanged.  Full stent cage expansion is  impeded by bulky calcification posterior to the annulus, towards the  transverse pericardial sinus.  Peak gradient 6 mm Hg, mean gradient 3 mm Hg, dimensionless index 0.57,  aortic valve area 2.3 cm (0.77 cm2 /m2 BSA). Acceleration time 96 ms.  Mild mitral insufficency.  No pericardial effusion.   He feels OK this AM, no CP, no SOB   Past Medical History:  Diagnosis Date   Allergy    RHINITIS   Arthritis    thumb   Complication of anesthesia    2012 shoulder surgery: patient unsure of what the problem was    GERD (gastroesophageal reflux disease)    "not really"   History of echocardiogram 03/29/2009    EF 50-55%; mild concentric LVH;    Hx of adenomatous colonic polyps    Hyperlipidemia    Hypertension    takes Diovan and Toprol   Obesity    PAF (paroxysmal atrial fibrillation) (HCC)    associated with OSA   Right bundle branch block    Rosacea    S/P TAVR (transcatheter aortic valve replacement) 04/18/2021   s/p TAVR with a 29 mm Edwards S3 via the TF approach by Dr. Angelena Form and Dr. Cyndia Bent   Shortness of breath    With activity   Sleep apnea    BiPap   Smoker    Stroke ([place]) 06/2016   "mini stroke"    Past Surgical History:  Procedure Laterality Date   COLONOSCOPY WITH PROPOFOL N/A 11/25/2020   Procedure: COLONOSCOPY WITH PROPOFOL;  Surgeon: Carol Ada, MD;  Location: [facility];  Service: Endoscopy;  Laterality: N/A;   FINGER SURGERY     Reconstruction of Left thumb and L index finger.  Reconstruction    HEMOSTASIS CLIP PLACEMENT  11/25/2020   Procedure: HEMOSTASIS CLIP PLACEMENT;  Surgeon: Carol Ada, MD;  Location: [facility];  Service: Endoscopy;;   KNEE ARTHROSCOPY     MULTIPLE EXTRACTIONS WITH ALVEOLOPLASTY N/A 04/11/2021   Procedure: MULTIPLE EXTRACTION WITH ALVEOLOPLASTY;  Surgeon: Charlaine Dalton, DMD;  Location: [place];  Service: Dentistry;  Laterality: N/A;   POLYPECTOMY  11/25/2020   Procedure: POLYPECTOMY;  Surgeon: Carol Ada, MD;  [facility];  Service: Endoscopy;;   RIGHT/LEFT HEART CATH AND CORONARY ANGIOGRAPHY N/A 03/23/2021   Procedure: RIGHT/LEFT HEART CATH AND CORONARY ANGIOGRAPHY;  Surgeon: Burnell Blanks, MD;  Location: [place] [facility];  Service: Cardiovascular;  Laterality: N/A;   SHOULDER CLOSED REDUCTION     Procedure: CLOSED REDUCTION SHOULDER;  Surgeon: Lawrence Santiago [facility];  Location: [place];  Service: Orthopedics;  Laterality: Right;   SHOULDER OPEN ROTATOR CUFF REPAIR  08/11/2011   Procedure: ROTATOR CUFF REPAIR SHOULDER OPEN;  Surgeon: Willa Frater III;  Location: [place];  Service: Orthopedics;  Laterality:  Right;   SUBMUCOSAL LIFTING INJECTION  11/25/2020   Procedure: SUBMUCOSAL LIFTING INJECTION;  Surgeon: Carol Ada, MD;  [facility];  Service: Endoscopy;;   SUBMUCOSAL TATTOO INJECTION  11/25/2020   Procedure: SUBMUCOSAL TATTOO INJECTION;  Surgeon: Carol Ada, MD;  [facility];  Service: Endoscopy;;   TEE WITHOUT CARDIOVERSION N/A 04/18/2021   Procedure: TRANSESOPHAGEAL ECHOCARDIOGRAM (TEE);  Surgeon: Burnell Blanks, MD;  Location: [place] [facility];  Service: Open Heart Surgery;  Laterality: N/A;   TONSILLECTOMY     as a child   TRANSCATHETER AORTIC VALVE REPLACEMENT, TRANSFEMORAL N/A 04/18/2021   Procedure: TRANSCATHETER AORTIC VALVE REPLACEMENT, TRANSFEMORAL;  Surgeon: Burnell Blanks, MD;  Location: [place] [facility];  Service: Open Heart Surgery;  Laterality: N/A;     Home Medications:  Prior to Admission medications   Medication Sig Start Date End Date Taking? Authorizing Provider  ALPRAZolam Duanne Moron) 1 MG tablet Take 1 tablet (1 mg total) by mouth at bedtime as needed for sleep. 12/22/20  Yes Denita Lung, MD  atorvastatin (LIPITOR) 10 MG tablet TAKE 1 TABLET BY MOUTH EVERY DAY AT 6 PM 04/26/20  Yes Denita Lung, MD  diltiazem (CARDIZEM CD) 120 MG 24 hr capsule Take 1 capsule (120 mg total) by mouth daily. 04/26/20  Yes Denita Lung, MD  furosemide (LASIX) 40 MG tablet Take 1 tablet (40 mg total) by mouth daily. 03/16/21  Yes Burnell Blanks, MD  HYDROcodone-acetaminophen (NORCO/VICODIN) 5-325 MG tablet Take 1 tablet by mouth every 6 (six) hours as needed for moderate pain. 03/10/21  Yes Denita Lung, MD  hydrocortisone 2.5 % cream Apply to external ears as needed for itching. 04/26/20  Yes Denita Lung, MD  methocarbamol (ROBAXIN) 500 MG tablet Take 1 tablet (500 mg total) by mouth 4 (four) times daily as needed for muscle spasms. 12/22/20  Yes Denita Lung, MD  metoprolol succinate (TOPROL-XL) 50 MG 24 hr tablet Take with or  immediately following a meal. 04/26/20  Yes Denita Lung, MD  potassium chloride SA (KLOR-CON) 20 MEQ tablet Take 1 tablet (20 mEq total) by mouth daily. 03/16/21  Yes Burnell Blanks, MD  valsartan-hydrochlorothiazide (DIOVAN-HCT) 160-25 MG tablet Take 0.5 tablets by mouth daily. Take half tab daily for the next 3 days and if blood pressure sustains above 120 can start taking 1 tablet daily after that 05/23/20  Yes Domenic Polite, MD  naproxen sodium (ALEVE) 220 MG tablet Take 110-220 mg by mouth 2 (two) times daily as needed (pain.).    [provider]  rivaroxaban (XARELTO) 20 MG TABS tablet TAKE 1 TABLET(20 MG) BY MOUTH DAILY WITH SUPPER 04/26/20   Denita Lung, MD    Inpatient Medications: Scheduled Meds:  aspirin  81 mg Oral Daily   atorvastatin  10 mg Oral Daily   Chlorhexidine Gluconate Cloth  6 each Topical  Q0600   sodium chloride flush  3 mL Intravenous Q12H   Continuous Infusions:  sodium chloride     sodium chloride 10 mL/hr at 04/19/21 0700   nitroGLYCERIN     phenylephrine (NEO-SYNEPHRINE) Adult infusion     PRN Meds: sodium chloride, acetaminophen **OR** acetaminophen, ALPRAZolam, HYDROcodone-acetaminophen, methocarbamol, morphine injection, ondansetron (ZOFRAN) IV, sodium chloride flush  Allergies:    Allergies  Allergen Reactions   Codeine Shortness Of Breath and Swelling    Patient has tolerated hydrocodone and Dilaudid.   Morphine And Related Shortness Of Breath and Swelling    Patient has tolerated hydrocodone and Dilaudid.     Social History:   Social History   Socioeconomic History   Marital status: Single    Spouse name: Not on file   Number of children: Not on file   Years of education: Not on file   Highest education level: Not on file  Occupational History   Not on file  Tobacco Use   Smoking status: Former    Packs/day: 1.00    Years: 40.00    Pack years: 40.00    Types: Cigarettes    Quit date: 06/20/2016    Years since  quitting: 4.8   Smokeless tobacco: Never  Vaping Use   Vaping Use: Never used  Substance and Sexual Activity   Alcohol use: Yes    Alcohol/week: 10.0 standard drinks    Types: 2 Glasses of wine, 6 Cans of beer, 2 Shots of liquor per week   Drug use: No   Sexual activity: Not Currently  Other Topics Concern   Not on file  Social History Narrative   Not on file   Social Determinants of Health   Financial Resource Strain: Not on file  Food Insecurity: Not on file  Transportation Needs: Not on file  Physical Activity: Not on file  Stress: Not on file  Social Connections: Not on file  Intimate Partner Violence: Not on file    Family History:   Family History  Problem Relation Age of Onset   Heart disease Mother    Cancer - Prostate Maternal Grandfather    Cancer - Prostate Paternal Grandfather    Cancer - Colon Father    Heart attack Father 63   Cancer Paternal Uncle    Anesthesia problems Neg Hx    Stroke Neg Hx      ROS:  Please see the history of present illness.  All other ROS reviewed and negative.     Physical Exam/Data:   Vitals:   04/19/21 0530 04/19/21 0600 04/19/21 0630 04/19/21 0700  BP:      Pulse: 60 (!) 58 (!) 59 (!) 59  Resp: '14 17 15 11  '$ Temp:      TempSrc:      SpO2: 95% 98% 96% 96%  Weight:      Height:        Intake/Output Summary (Last 24 hours) at 04/19/2021 0925 Last data filed at 04/19/2021 0700 Gross per 24 hour  Intake 1010.35 ml  Output 1975 ml  Net -964.65 ml   Last 3 Weights 04/18/2021 04/14/2021 04/12/2021  Weight (lbs) 425 lb 476 lb 9.6 oz 425 lb  Weight (kg) 192.779 kg 216.184 kg 192.779 kg     Body mass index is 53.12 kg/m.  General:  Well nourished, well developed, in no acute distress HEENT: normal Lymph: no adenopathy Neck: no JVD Endocrine:  No thryomegaly Vascular: No carotid bruit  Cardiac:  RRR (paced); soft  SM, no gallops or rubs Lungs:  CTA b/l, no wheezing, rhonchi or rales  Abd: soft, nontender, obese Ext:  no edema Musculoskeletal:  No deformities, knee imobilizer RLE Skin: warm and dry  Neuro:  no focal abnormalities noted Psych:  Normal affect   EKG:  The EKG was personally reviewed and demonstrates:    Afib, V pacing  04/14/21: AFib 68bpm, RBBB, LAD  Telemetry:  Telemetry was personally reviewed and demonstrates:   V paced, this AM, noted V pacing at 30bpm  Relevant CV Studies:  03/23/21; LHC No angiographic evidence of CAD Severe aortic stenosis (mean gradient 41.3 mmHg, peak to peak gradient 62 mmHg)   Laboratory Data:  High Sensitivity Troponin:  No results for input(s): TROPONINIHS in the last 720 hours.   Chemistry Recent Labs  Lab 04/14/21 1530 04/18/21 0950 04/19/21 0418  NA 135 135 133*  K 5.0 4.6 4.0  CL 103 100 101  CO2 21*  --  24  GLUCOSE 91 118* 157*  BUN 22 29* 18  CREATININE 1.10 1.10 1.02  CALCIUM 9.1  --  9.0  GFRNONAA >60  --  >60  ANIONGAP 11  --  8    Recent Labs  Lab 04/14/21 1530  PROT 7.3  ALBUMIN 3.8  AST 35  ALT 27  ALKPHOS 68  BILITOT 1.6*   Hematology Recent Labs  Lab 04/14/21 1530 04/18/21 0950 04/19/21 0418  WBC 11.8*  --  10.9*  RBC 4.78  --  4.50  HGB 14.8 14.3 13.9  HCT 45.3 42.0 40.7  MCV 94.8  --  90.4  MCH 31.0  --  30.9  MCHC 32.7  --  34.2  RDW 14.5  --  14.0  PLT 216  --  176   BNPNo results for input(s): BNP, PROBNP in the last 168 hours.  DDimer No results for input(s): DDIMER in the last 168 hours.   Radiology/Studies:     Assessment and Plan:   Severe AS POD #1, s/p TAVR CHB with pacing at 30bpm this AM when searching for escape rate. Off home meds  Pre-TAVR baseline conduction system disease with RBBB, LAD Do not expect recovery if not occurred yet and Naven Giambalvo need pacing.  Dr. Curt Bears has seen and examined the patient, discussed rational for PPM Discussed procedure, potential risks and benefits, the patient is willing to proceed  2. AFib, Longstanding persistent CHA2DS2Vasc is 3 with prior  stroke 2018 is the last EKG I find with SR In review of Dr. Lysbeth Penner notes, seems patient intermittently has had awareness of his Afib, does not seem that of late at least that there has been discussion on rhythm strategy.  I have discussed with Dr. Debara Pickett, hopes that post TAVR he feels better, able to become more ambulatory and lose some weight, with a goal if able down the line to restore SR if able.  Johsua Shevlin plan dual chamber device  Zain Lankford need to resume his Crane ASAP post pacer    Risk Assessment/Risk Scores:    For questions or updates, please contact [facility] Please consult [email or web address] for contact info under    Signed, Baldwin Jamaica, PA-C  04/19/2021 9:25 AM  I have seen and examined this patient with Tommye Standard.  Agree with above, note added to reflect my findings.  On exam, RRR, no murmurs. Presented for TAVR done yesterday.   Remains in complete AV block. Has RBBB, LAFB at baseline. Kailie Polus plan for pacemaker implant.  Fredric Dine has  presented today for surgery, with the diagnosis of complete AV block.  The various methods of treatment have been discussed with the patient and family. After consideration of risks, benefits and other options for treatment, the patient has consented to  Procedure(s): Pacemaker implant as a surgical intervention .  Risks include but not limited to bleeding, infection, pneumothorax, perforation, tamponade, vascular damage, renal failure, MI, stroke, death, and lead dislodgement . The patient's history has been reviewed, patient examined, no change in status, stable for surgery.  I have reviewed the patient's chart and labs.  Questions were answered to the patient's satisfaction.    Nyjai Graff Curt Bears, MD 04/19/2021 10:32 AM

## 2021-04-19 NOTE — Progress Notes (Signed)
Progress Note  Patient Name: David [name] Date of Encounter: 04/19/2021  [facility] Cardiologist: [name], MD   Subjective   No complaints  Inpatient Medications    Scheduled Meds:  aspirin  81 mg Oral Daily   atorvastatin  10 mg Oral Daily   Chlorhexidine Gluconate Cloth  6 each Topical Q0600   sodium chloride flush  3 mL Intravenous Q12H   Continuous Infusions:  sodium chloride     sodium chloride 10 mL/hr at 04/19/21 0700   nitroGLYCERIN     phenylephrine (NEO-SYNEPHRINE) Adult infusion     PRN Meds: sodium chloride, acetaminophen **OR** acetaminophen, ALPRAZolam, HYDROcodone-acetaminophen, methocarbamol, morphine injection, ondansetron (ZOFRAN) IV, sodium chloride flush   Vital Signs    Vitals:   04/19/21 0530 04/19/21 0600 04/19/21 0630 04/19/21 0700  BP:      Pulse: 60 (!) 58 (!) 59 (!) 59  Resp: '14 17 15 11  '$ Temp:      TempSrc:      SpO2: 95% 98% 96% 96%  Weight:      Height:        Intake/Output Summary (Last 24 hours) at 04/19/2021 0733 Last data filed at 04/19/2021 0700 Gross per 24 hour  Intake 1010.35 ml  Output 1975 ml  Net -964.65 ml   Last 3 Weights 04/18/2021 04/14/2021 04/12/2021  Weight (lbs) 425 lb 476 lb 9.6 oz 425 lb  Weight (kg) 192.779 kg 216.184 kg 192.779 kg      Telemetry    V paced - Personally Reviewed  ECG    V paced - Personally Reviewed  Physical Exam   GEN: No acute distress.  Obese male Neck: No JVD Cardiac: RRR, no murmurs, rubs, or gallops.  Respiratory: Clear to auscultation bilaterally. GI: Soft, nontender, non-distended  MS: No edema; No deformity. Neuro:  Nonfocal  Psych: Normal affect   Labs    High Sensitivity Troponin:  No results for input(s): TROPONINIHS in the last 720 hours.    Chemistry Recent Labs  Lab 04/14/21 1530 04/18/21 0950 04/19/21 0418  NA 135 135 133*  K 5.0 4.6 4.0  CL 103 100 101  CO2 21*  --  24  GLUCOSE 91 118* 157*  BUN 22 29* 18  CREATININE 1.10 1.10 1.02   CALCIUM 9.1  --  9.0  PROT 7.3  --   --   ALBUMIN 3.8  --   --   AST 35  --   --   ALT 27  --   --   ALKPHOS 68  --   --   BILITOT 1.6*  --   --   GFRNONAA >60  --  >60  ANIONGAP 11  --  8     Hematology Recent Labs  Lab 04/14/21 1530 04/18/21 0950 04/19/21 0418  WBC 11.8*  --  10.9*  RBC 4.78  --  4.50  HGB 14.8 14.3 13.9  HCT 45.3 42.0 40.7  MCV 94.8  --  90.4  MCH 31.0  --  30.9  MCHC 32.7  --  34.2  RDW 14.5  --  14.0  PLT 216  --  176    BNPNo results for input(s): BNP, PROBNP in the last 168 hours.   DDimer No results for input(s): DDIMER in the last 168 hours.   Radiology    ECHO TEE  Result Date: 04/18/2021    TRANSESOPHOGEAL ECHO REPORT   Patient Name:   David [name] Date of Exam: 04/18/2021  Medical Rec #:  [id number]    Height:       75.0 in Accession #:    [id number]   Weight:       425.0 lb Date of Birth:  01/05/1956     BSA:          3.023 m Patient Age:    65 years     BP:           150/62 mmHg Patient Gender: M            HR:           81 bpm. Exam Location:  Inpatient Procedure: Transesophageal Echo, Cardiac Doppler and Color Doppler Indications:     Aortic Stenosis  History:         Patient has prior history of Echocardiogram examinations, most                  recent 03/03/2021. Stroke, Aortic Valve Disease,                  Arrythmias:Atrial Fibrillation and RBBB, Signs/Symptoms:Murmur;                  Risk Factors:Hypertension and Dyslipidemia. GERD.                  Aortic Valve: 29 mm Sapien prosthetic, stented (TAVR) valve is                  present in the aortic position. Procedure Date: 04/18/2021.  Sonographer:     Darlina Sicilian RDCS Referring Phys:  [place] Diagnosing Phys: Sanda Klein MD PROCEDURE: After discussion of the risks and benefits of a TEE, an informed consent was obtained from the patient. The patient was intubated. The transesophogeal probe was passed without difficulty through the esophogus of the patient. Imaged  were obtained with the patient in a supine position. Sedation performed by different physician. The patient was monitored while under deep sedation. Anesthestetic sedation was provided intravenously by Anesthesiology: '200mg'$  of Propofol, '100mg'$  of Lidocaine. Image quality was good. The patient's vital signs; including heart rate, blood pressure, and oxygen saturation; remained stable throughout the procedure. The patient developed no complications during the procedure. PREOPERATIVE FINDINGS: Normal left ventricular systolic function and regional wall motion. EF 60%. Bicuspid aortic valve with bulky calcification, severe aortic stenosis and mild aortic insufficiency. Peak gradient 60 mm Hg, mean gradient 29 mm Hg, dimensionless index 0.3, aortic valve area 0.9 cm (0.3 cm2 /m2 BSA). Mild mitral insufficency. No pericardial effusion. POSTOPERATIVE FINDINGS: Normal left ventricular systolic function and regional wall motion. EF 60% Well seated TAVR stent valve. There is mild aortic insufficiency, along the arc between the atrial septum and left coronary ostium. Despite baloon reinflation at larger volume, the perivalvular leak persisted unchanged. Full stent cage expansion is impeded by bulky calcification posterior to the annulus, towards the transverse pericardial sinus. Peak gradient 6 mm Hg, mean gradient 3 mm Hg, dimensionless index 0.57, aortic valve area 2.3 cm (0.77 cm2 /m2 BSA). Acceleration time 96 ms. Mild mitral insufficency. No pericardial effusion.  IMPRESSIONS  1. Left ventricular ejection fraction, by estimation, is 60 to 65%. The left ventricle has normal function. The left ventricle has no regional wall motion abnormalities. There is mild concentric left ventricular hypertrophy. Left ventricular diastolic function could not be evaluated.  2. Right ventricular systolic function is normal. The right ventricular size is normal. There is normal pulmonary artery systolic pressure.  3. Left atrial size was  moderately dilated. No left atrial/left atrial appendage thrombus was detected.  4. The mitral valve is normal in structure. Mild mitral valve regurgitation.  5. Sievers one type valve (fused left and right cusps). The aortic valve is bicuspid. There is severe calcifcation of the aortic valve. There is severe thickening of the aortic valve. Aortic valve regurgitation is mild. There is a 29 mm Sapien prosthetic (TAVR) valve present in the aortic position. Procedure Date: 04/18/2021. Aortic valve mean gradient measures 3.2 mmHg. Aortic valve Vmax measures 1.29 m/s. Aortic valve acceleration time measures 96 msec.  6. There is borderline dilatation of the ascending aorta, measuring 38 mm. There is mild (Grade II) protruding plaque involving the descending aorta. FINDINGS  Left Ventricle: Left ventricular ejection fraction, by estimation, is 60 to 65%. The left ventricle has normal function. The left ventricle has no regional wall motion abnormalities. The left ventricular internal cavity size was normal in size. There is  mild concentric left ventricular hypertrophy. Left ventricular diastolic function could not be evaluated. Right Ventricle: The right ventricular size is normal. No increase in right ventricular wall thickness. Right ventricular systolic function is normal. There is normal pulmonary artery systolic pressure. The tricuspid regurgitant velocity is 2.19 m/s, and  with an assumed right atrial pressure of 5 mmHg, the estimated right ventricular systolic pressure is [id number] mmHg. Left Atrium: Left atrial size was moderately dilated. Spontaneous echo contrast was present. No left atrial/left atrial appendage thrombus was detected. Right Atrium: Right atrial size was normal in size. Pericardium: There is no evidence of pericardial effusion. Mitral Valve: The mitral valve is normal in structure. Mild to moderate mitral annular calcification. Mild mitral valve regurgitation. Tricuspid Valve: The tricuspid valve is  normal in structure. Tricuspid valve regurgitation is mild. Aortic Valve: Sievers one type valve (fused left and right cusps). The aortic valve is bicuspid. There is severe calcifcation of the aortic valve. There is severe thickening of the aortic valve. Aortic valve regurgitation is mild. Aortic valve mean gradient measures 3.2 mmHg. Aortic valve peak gradient measures 6.7 mmHg. Aortic valve area, by VTI measures 2.32 cm. There is a 29 mm Sapien prosthetic, stented (TAVR) valve present in the aortic position. Procedure Date: 04/18/2021. Pulmonic Valve: The pulmonic valve was normal in structure. Pulmonic valve regurgitation is trivial. Aorta: The aortic root, ascending aorta and aortic arch are all structurally normal, with no evidence of dilitation or obstruction. There is borderline dilatation of the ascending aorta, measuring 38 mm. There is mild (Grade II) protruding plaque involving the descending aorta. IAS/Shunts: No atrial level shunt detected by color flow Doppler.  LEFT VENTRICLE PLAX 2D LVOT diam:     2.27 cm LV SV:         58 LV SV Index:   19 LVOT Area:     4.05 cm  AORTIC VALVE AV Area (Vmax):    2.43 cm AV Area (Vmean):   2.74 cm AV Area (VTI):     2.32 cm AV Vmax:           129.19 cm/s AV Vmean:          79.878 cm/s AV VTI:            0.249 m AV Peak Grad:      6.7 mmHg AV Mean Grad:      3.2 mmHg LVOT Vmax:         77.70 cm/s LVOT Vmean:  54.037 cm/s LVOT VTI:          0.143 m LVOT/AV VTI ratio: 0.57 TRICUSPID VALVE TR Peak grad:   19.2 mmHg TR Vmax:        219.00 cm/s  SHUNTS Systemic VTI:  0.14 m Systemic Diam: 2.27 cm Sanda Klein MD Electronically signed by Sanda Klein MD Signature Date/Time: 04/18/2021/2:22:19 PM    Final    Structural Heart Procedure  Result Date: 04/18/2021 Successful TAVR from right transfemoral approach with placement of a 29 mm Edwards Sapien 3 Ultra THV. See full operative note in the progress notes section.    Cardiac Studies     Patient Profile      65 y.o. male with history of HTN, hyperlipidemia, morbid obesity, paroxysmal atrial fibrillation, sleep apnea, former tobacco abuse and severe aortic stenosis who underwent TAVR on 04/18/21 with placement of a 29 mm Edwards Sapien 3 Ultra THV from the right transfemoral approach.   Assessment & Plan    Severe aortic stenosis: He is hemodynamically stable today post TAVR. He remains in complete heart block with temporary transvenous pacing wire in place. BP stable. EP team has been consulted to consider permanent pacemaker placement today. Will continue ASA and will plan to resume his Xarelto after his pacemaker placement. Echo today to assess the new valve.   2. Complete heart block: See above.   3. HTN: BP elevated. Cardizem and Toprol on hold with CHB. Resume post pacemaker implantation.     For questions or updates, please contact [place] Please consult [email or web address] for contact info under        Signed, Lauree Chandler, MD  04/19/2021, 7:33 AM

## 2021-04-19 NOTE — Interval H&P Note (Signed)
History and Physical Interval Note:  04/19/2021 10:53 AM  David [name]  has presented today for surgery, with the diagnosis of heart block.  The various methods of treatment have been discussed with the patient and family. After consideration of risks, benefits and other options for treatment, the patient has consented to  Procedure(s): PACEMAKER IMPLANT (N/A) as a surgical intervention.  The patient's history has been reviewed, patient examined, no change in status, stable for surgery.  I have reviewed the patient's chart and labs.  Questions were answered to the patient's satisfaction.     Florance Paolillo [employer]

## 2021-04-19 NOTE — Progress Notes (Signed)
  Echocardiogram 2D Echocardiogram has been performed.  David [name] 04/19/2021, 9:59 AM

## 2021-04-19 NOTE — H&P (View-Only) (Signed)
Cardiology Consultation:   Patient ID: [name] MRN: [id number]; DOB: [birth year]  Admit date: 04/18/2021 Date of Consult: 04/19/2021  PCP:  David Lung, MD   [name] Cardiologist:  David Casino, MD Structural Heart; Dr. Angelena [name]   Patient Profile:   David [name] is a 65 y.o. male with a hx of AFib (described as persistent),super morbid obesity, OSA, HTN, prior stroke, VHD w/severe AS who is being seen 04/19/2021 for the evaluation of post TAVR CHB at the request of Dr. Angelena [name].  History of Present Illness:   David [name] was admitted yesterday to undergo TAVR, he had his procedure during which he developed CHB requiring use of his temp wire.   EP is called with ongoing temp pacing wire dependent this AM.  Home meds include diltiazem and Toprol, neither of which he had yesterday.  Pre-TAVR he had RBBB, LAD  LABS K+ 4.0 Mag 2.1 BUN/Creat 18/1.02 WBC 10.9 H/H 13/40 Plts 176  Inter-op TEE yesterday post valve deployment: Normal left ventricular systolic function and regional wall motion. EF 60%  Well seated TAVR stent valve. There is mild aortic insufficiency, along  the arc between the atrial septum and left coronary ostium. Despite baloon  reinflation at larger volume, the perivalvular leak persisted unchanged.  Full stent cage expansion is  impeded by bulky calcification posterior to the annulus, towards the  transverse pericardial sinus.  Peak gradient 6 mm Hg, mean gradient 3 mm Hg, dimensionless index 0.57,  aortic valve area 2.3 cm (0.77 cm2 /m2 BSA). Acceleration time 96 ms.  Mild mitral insufficency.  No pericardial effusion.   He feels OK this AM, no CP, no SOB   Past Medical History:  Diagnosis Date   Allergy    RHINITIS   Arthritis    thumb   Complication of anesthesia    2012 shoulder surgery: patient unsure of what the problem was    GERD (gastroesophageal reflux disease)    "not really"   History of echocardiogram 03/29/2009    EF 50-55%; mild concentric LVH;    Hx of adenomatous colonic polyps    Hyperlipidemia    Hypertension    takes Diovan and Toprol   Obesity    PAF (paroxysmal atrial fibrillation) (HCC)    associated with OSA   Right bundle branch block    Rosacea    S/P TAVR (transcatheter aortic valve replacement) 04/18/2021   s/p TAVR with a 29 mm Edwards S3 via the TF approach by Dr. Angelena [name] and Dr. Cyndia [name]   Shortness of breath    With activity   Sleep apnea    BiPap   Smoker    Stroke ([place]) 06/2016   "mini stroke"    Past Surgical History:  Procedure Laterality Date   COLONOSCOPY WITH PROPOFOL N/A 11/25/2020   Procedure: COLONOSCOPY WITH PROPOFOL;  Surgeon: David Ada, MD;  Location: [facility];  Service: Endoscopy;  Laterality: N/A;   FINGER SURGERY     Reconstruction of Left thumb and L index finger.  Reconstruction    HEMOSTASIS CLIP PLACEMENT  11/25/2020   Procedure: HEMOSTASIS CLIP PLACEMENT;  Surgeon: David Ada, MD;  Location: [facility];  Service: Endoscopy;;   KNEE ARTHROSCOPY     MULTIPLE EXTRACTIONS WITH ALVEOLOPLASTY N/A 04/11/2021   Procedure: MULTIPLE EXTRACTION WITH ALVEOLOPLASTY;  Surgeon: David [name], David [name];  Location: [place];  Service: Dentistry;  Laterality: N/A;   POLYPECTOMY  11/25/2020   Procedure: POLYPECTOMY;  Surgeon: David Ada, MD;  [facility]: [facility];  Service: Endoscopy;;   RIGHT/LEFT HEART CATH AND CORONARY ANGIOGRAPHY N/A 03/23/2021   Procedure: RIGHT/LEFT HEART CATH AND CORONARY ANGIOGRAPHY;  Surgeon: David Blanks, MD;  Location: [place] [facility];  Service: Cardiovascular;  Laterality: N/A;   SHOULDER CLOSED REDUCTION     Procedure: CLOSED REDUCTION SHOULDER;  Surgeon: David [name];  Location: [place];  Service: Orthopedics;  Laterality: Right;   SHOULDER OPEN ROTATOR CUFF REPAIR  08/11/2011   Procedure: ROTATOR CUFF REPAIR SHOULDER OPEN;  Surgeon: David [name];  Location: [place];  Service: Orthopedics;  Laterality:  Right;   SUBMUCOSAL LIFTING INJECTION  11/25/2020   Procedure: SUBMUCOSAL LIFTING INJECTION;  Surgeon: David Ada, MD;  [facility]: [facility];  Service: Endoscopy;;   SUBMUCOSAL TATTOO INJECTION  11/25/2020   Procedure: SUBMUCOSAL TATTOO INJECTION;  Surgeon: David Ada, MD;  [facility]: [facility];  Service: Endoscopy;;   TEE WITHOUT CARDIOVERSION N/A 04/18/2021   Procedure: TRANSESOPHAGEAL ECHOCARDIOGRAM (TEE);  Surgeon: David Blanks, MD;  Location: [place] [facility];  Service: Open Heart Surgery;  Laterality: N/A;   TONSILLECTOMY     as a child   TRANSCATHETER AORTIC VALVE REPLACEMENT, TRANSFEMORAL N/A 04/18/2021   Procedure: TRANSCATHETER AORTIC VALVE REPLACEMENT, TRANSFEMORAL;  Surgeon: David Blanks, MD;  Location: [place] [facility];  Service: Open Heart Surgery;  Laterality: N/A;     Home Medications:  Prior to Admission medications   Medication Sig Start Date End Date Taking? Authorizing Provider  ALPRAZolam Duanne Moron) 1 MG tablet Take 1 tablet (1 mg total) by mouth at bedtime as needed for sleep. 12/22/20  Yes David Lung, MD  atorvastatin (LIPITOR) 10 MG tablet TAKE 1 TABLET BY MOUTH EVERY DAY AT 6 PM 04/26/20  Yes David Lung, MD  diltiazem (CARDIZEM CD) 120 MG 24 hr capsule Take 1 capsule (120 mg total) by mouth daily. 04/26/20  Yes David Lung, MD  furosemide (LASIX) 40 MG tablet Take 1 tablet (40 mg total) by mouth daily. 03/16/21  Yes David Blanks, MD  HYDROcodone-acetaminophen (NORCO/VICODIN) 5-325 MG tablet Take 1 tablet by mouth every 6 (six) hours as needed for moderate pain. 03/10/21  Yes David Lung, MD  hydrocortisone 2.5 % cream Apply to external ears as needed for itching. 04/26/20  Yes David Lung, MD  methocarbamol (ROBAXIN) 500 MG tablet Take 1 tablet (500 mg total) by mouth 4 (four) times daily as needed for muscle spasms. 12/22/20  Yes David Lung, MD  metoprolol succinate (TOPROL-XL) 50 MG 24 hr tablet Take with or  immediately following a meal. 04/26/20  Yes David Lung, MD  potassium chloride SA (KLOR-CON) 20 MEQ tablet Take 1 tablet (20 mEq total) by mouth daily. 03/16/21  Yes David Blanks, MD  valsartan-hydrochlorothiazide (DIOVAN-HCT) 160-25 MG tablet Take 0.5 tablets by mouth daily. Take half tab daily for the next 3 days and if blood pressure sustains above 120 can start taking 1 tablet daily after that 05/23/20  Yes Domenic Polite, MD  naproxen sodium (ALEVE) 220 MG tablet Take 110-220 mg by mouth 2 (two) times daily as needed (pain.).    [provider]  rivaroxaban (XARELTO) 20 MG TABS tablet TAKE 1 TABLET(20 MG) BY MOUTH DAILY WITH SUPPER 04/26/20   David Lung, MD    Inpatient Medications: Scheduled Meds:  aspirin  81 mg Oral Daily   atorvastatin  10 mg Oral Daily   Chlorhexidine Gluconate Cloth  6 each Topical  Q0600   sodium chloride flush  3 mL Intravenous Q12H   Continuous Infusions:  sodium chloride     sodium chloride 10 mL/hr at 04/19/21 0700   nitroGLYCERIN     phenylephrine (NEO-SYNEPHRINE) Adult infusion     PRN Meds: sodium chloride, acetaminophen **OR** acetaminophen, ALPRAZolam, HYDROcodone-acetaminophen, methocarbamol, morphine injection, ondansetron (ZOFRAN) IV, sodium chloride flush  Allergies:    Allergies  Allergen Reactions   Codeine Shortness Of Breath and Swelling    Patient has tolerated hydrocodone and Dilaudid.   Morphine And Related Shortness Of Breath and Swelling    Patient has tolerated hydrocodone and Dilaudid.     Social History:   Social History   Socioeconomic History   Marital status: Single    Spouse name: Not on file   Number of children: Not on file   Years of education: Not on file   Highest education level: Not on file  Occupational History   Not on file  Tobacco Use   Smoking status: Former    Packs/day: 1.00    Years: 40.00    Pack years: 40.00    Types: Cigarettes    Quit date: 06/20/2016    Years since  quitting: 4.8   Smokeless tobacco: Never  Vaping Use   Vaping Use: Never used  Substance and Sexual Activity   Alcohol use: Yes    Alcohol/week: 10.0 standard drinks    Types: 2 Glasses of wine, 6 Cans of beer, 2 Shots of liquor per week   Drug use: No   Sexual activity: Not Currently  Other Topics Concern   Not on file  Social History Narrative   Not on file   Social Determinants of Health   Financial Resource Strain: Not on file  Food Insecurity: Not on file  Transportation Needs: Not on file  Physical Activity: Not on file  Stress: Not on file  Social Connections: Not on file  Intimate Partner Violence: Not on file    Family History:   Family History  Problem Relation Age of Onset   Heart disease Mother    Cancer - Prostate Maternal Grandfather    Cancer - Prostate Paternal Grandfather    Cancer - Colon Father    Heart attack Father 70   Cancer Paternal Uncle    Anesthesia problems Neg Hx    Stroke Neg Hx      ROS:  Please see the history of present illness.  All other ROS reviewed and negative.     Physical Exam/Data:   Vitals:   04/19/21 0530 04/19/21 0600 04/19/21 0630 04/19/21 0700  BP:      Pulse: 60 (!) 58 (!) 59 (!) 59  Resp: '14 17 15 11  '$ Temp:      TempSrc:      SpO2: 95% 98% 96% 96%  Weight:      Height:        Intake/Output Summary (Last 24 hours) at 04/19/2021 0925 Last data filed at 04/19/2021 0700 Gross per 24 hour  Intake 1010.35 ml  Output 1975 ml  Net -964.65 ml   Last 3 Weights 04/18/2021 04/14/2021 04/12/2021  Weight (lbs) 425 lb 476 lb 9.6 oz 425 lb  Weight (kg) 192.779 kg 216.184 kg 192.779 kg     Body mass index is 53.12 kg/m.  General:  Well nourished, well developed, in no acute distress HEENT: normal Lymph: no adenopathy Neck: no JVD Endocrine:  No thryomegaly Vascular: No carotid bruit  Cardiac:  RRR (paced); soft  SM, no gallops or rubs Lungs:  CTA b/l, no wheezing, rhonchi or rales  Abd: soft, nontender, obese Ext:  no edema Musculoskeletal:  No deformities, knee imobilizer RLE Skin: warm and dry  Neuro:  no focal abnormalities noted Psych:  Normal affect   EKG:  The EKG was personally reviewed and demonstrates:    Afib, V pacing  04/14/21: AFib 68bpm, RBBB, LAD  Telemetry:  Telemetry was personally reviewed and demonstrates:   V paced, this AM, noted V pacing at 30bpm  Relevant CV Studies:  03/23/21; LHC No angiographic evidence of CAD Severe aortic stenosis (mean gradient 41.3 mmHg, peak to peak gradient 62 mmHg)   Laboratory Data:  High Sensitivity Troponin:  No results for input(s): TROPONINIHS in the last 720 hours.   Chemistry Recent Labs  Lab 04/14/21 1530 04/18/21 0950 04/19/21 0418  NA 135 135 133*  K 5.0 4.6 4.0  CL 103 100 101  CO2 21*  --  24  GLUCOSE 91 118* 157*  BUN 22 29* 18  CREATININE 1.10 1.10 1.02  CALCIUM 9.1  --  9.0  GFRNONAA >60  --  >60  ANIONGAP 11  --  8    Recent Labs  Lab 04/14/21 1530  PROT 7.3  ALBUMIN 3.8  AST 35  ALT 27  ALKPHOS 68  BILITOT 1.6*   Hematology Recent Labs  Lab 04/14/21 1530 04/18/21 0950 04/19/21 0418  WBC 11.8*  --  10.9*  RBC 4.78  --  4.50  HGB 14.8 14.3 13.9  HCT 45.3 42.0 40.7  MCV 94.8  --  90.4  MCH 31.0  --  30.9  MCHC 32.7  --  34.2  RDW 14.5  --  14.0  PLT 216  --  176   BNPNo results for input(s): BNP, PROBNP in the last 168 hours.  DDimer No results for input(s): DDIMER in the last 168 hours.   Radiology/Studies:     Assessment and Plan:   Severe AS POD #1, s/p TAVR CHB with pacing at 30bpm this AM when searching for escape rate. Off home meds  Pre-TAVR baseline conduction system disease with RBBB, LAD Do not expect recovery if not occurred yet and David [name] need pacing.  Dr. Curt [name] has seen and examined the patient, discussed rational for PPM Discussed procedure, potential risks and benefits, the patient is willing to proceed  2. AFib, Longstanding persistent CHA2DS2Vasc is 3 with prior  stroke 2018 is the last EKG I find with SR In review of Dr. Lysbeth Penner notes, seems patient intermittently has had awareness of his Afib, does not seem that of late at least that there has been discussion on rhythm strategy.  I have discussed with Dr. Debara Pickett, hopes that post TAVR he feels better, able to become more ambulatory and lose some weight, with a goal if able down the line to restore SR if able.  Niralya Ohanian plan dual chamber device  David [name] need to resume his [place] ASAP post pacer    Risk Assessment/Risk Scores:    For questions or updates, please contact David [name] Please consult [email or web address] for contact info under    Signed, Baldwin Jamaica, PA-C  04/19/2021 9:25 AM  I have seen and examined this patient with Tommye Standard.  Agree with above, note added to reflect my findings.  On exam, RRR, no murmurs. Presented for TAVR done yesterday.   Remains in complete AV block. Has RBBB, LAFB at baseline. Brees Hounshell plan for pacemaker implant.  Fredric Dine has  presented today for surgery, with the diagnosis of complete AV block.  The various methods of treatment have been discussed with the patient and family. After consideration of risks, benefits and other options for treatment, the patient has consented to  Procedure(s): Pacemaker implant as a surgical intervention .  Risks include but not limited to bleeding, infection, pneumothorax, perforation, tamponade, vascular damage, renal failure, MI, stroke, death, and lead dislodgement . The patient's history has been reviewed, patient examined, no change in status, stable for surgery.  I have reviewed the patient's chart and labs.  Questions were answered to the patient's satisfaction.    David Priego Curt Bears, MD 04/19/2021 10:32 AM

## 2021-04-19 NOTE — Progress Notes (Signed)
Left venous sheath removed at 1630. Pressure held for 15 minutes. Site is level 0 with no hematoma, DP +2. BP 129/66, HR 60

## 2021-04-20 ENCOUNTER — Inpatient Hospital Stay (HOSPITAL_COMMUNITY): Payer: 59

## 2021-04-20 ENCOUNTER — Encounter (HOSPITAL_COMMUNITY): Payer: Self-pay | Admitting: Cardiology

## 2021-04-20 ENCOUNTER — Encounter: Payer: Self-pay | Admitting: Physician Assistant

## 2021-04-20 DIAGNOSIS — Z006 Encounter for examination for normal comparison and control in clinical research program: Secondary | ICD-10-CM | POA: Diagnosis not present

## 2021-04-20 DIAGNOSIS — I442 Atrioventricular block, complete: Secondary | ICD-10-CM | POA: Diagnosis not present

## 2021-04-20 DIAGNOSIS — Z950 Presence of cardiac pacemaker: Secondary | ICD-10-CM

## 2021-04-20 DIAGNOSIS — I350 Nonrheumatic aortic (valve) stenosis: Secondary | ICD-10-CM | POA: Diagnosis not present

## 2021-04-20 DIAGNOSIS — I4811 Longstanding persistent atrial fibrillation: Secondary | ICD-10-CM | POA: Diagnosis not present

## 2021-04-20 LAB — CBC
HCT: 43.6 % (ref 39.0–52.0)
Hemoglobin: 14.6 g/dL (ref 13.0–17.0)
MCH: 30.6 pg (ref 26.0–34.0)
MCHC: 33.5 g/dL (ref 30.0–36.0)
MCV: 91.4 fL (ref 80.0–100.0)
Platelets: 176 10*3/uL (ref 150–400)
RBC: 4.77 MIL/uL (ref 4.22–5.81)
RDW: 14.1 % (ref 11.5–15.5)
WBC: 13.7 10*3/uL — ABNORMAL HIGH (ref 4.0–10.5)
nRBC: 0 % (ref 0.0–0.2)

## 2021-04-20 LAB — BASIC METABOLIC PANEL
Anion gap: 11 (ref 5–15)
BUN: 18 mg/dL (ref 8–23)
CO2: 23 mmol/L (ref 22–32)
Calcium: 8.8 mg/dL — ABNORMAL LOW (ref 8.9–10.3)
Chloride: 99 mmol/L (ref 98–111)
Creatinine, Ser: 1.03 mg/dL (ref 0.61–1.24)
GFR, Estimated: >60 mL/min (ref >60)
Glucose, Bld: 96 mg/dL (ref 70–99)
Potassium: 4 mmol/L (ref 3.5–5.1)
Sodium: 133 mmol/L — ABNORMAL LOW (ref 135–145)

## 2021-04-20 MED ORDER — VALSARTAN-HYDROCHLOROTHIAZIDE 160-25 MG PO TABS: 1.0000 | ORAL_TABLET | Freq: Every day | ORAL | Status: DC

## 2021-04-20 MED ORDER — ASPIRIN 81 MG PO CHEW: 81.0000 mg | CHEWABLE_TABLET | Freq: Every day | ORAL | 1 refills | Status: DC

## 2021-04-20 MED ORDER — METOPROLOL SUCCINATE ER 50 MG PO TB24
50.0000 mg | ORAL_TABLET | Freq: Every day | ORAL | Status: DC
  Administered 2021-04-20: 50 mg via ORAL
  Filled 2021-04-20: qty 1

## 2021-04-20 MED ORDER — DILTIAZEM HCL ER COATED BEADS 120 MG PO CP24
120.0000 mg | ORAL_CAPSULE | Freq: Every day | ORAL | Status: DC
  Administered 2021-04-20: 120 mg via ORAL
  Filled 2021-04-20: qty 1

## 2021-04-20 NOTE — Progress Notes (Signed)
Patient discharged to home with friend. Swot team  Transported pt. To front entrance via w/c.  All discharge instructions reviewed.

## 2021-04-20 NOTE — Progress Notes (Signed)
CARDIAC REHAB PHASE I   PRE:  Rate/Rhythm: 60 paced  BP:  Supine: 138/64  Sitting:   Standing:    SaO2: 99%RA  MODE:  Ambulation: 170 ft   POST:  Rate/Rhythm: 69 paced  BP:  Supine:   Sitting: 172/92  Standing:    SaO2: 97%RA 0934-1025 Pt walked 170 ft on RA with his cane independently. Needed assistance to get OOB but walked independently with two stand by assistants in case needed. Pt stated he could tell difference in his breathing. Stopped once to rest. To sitting on side of bed after walk. Reinforced keeping left arm close to body. Pt stated MD had gone over left arm restrictions.  Dr Angelena Form in to .see pt after walk. Gave pt heart healthy diet and discussed a few healthy food choices. Encouraged walking as tolerated increasing distance slowly. Pt stated he is determined to lose weight. Discussed CRP 2 and he is very interested. Referral made to [facility].   Graylon Good, RN BSN  04/20/2021 10:20 AM

## 2021-04-20 NOTE — Progress Notes (Addendum)
Progress Note  Patient Name: David [name] Date of Encounter: 04/20/2021  [facility] Cardiologist: [name], MD   Subjective   Feels well, no CP, SOB, denies site pain  Inpatient Medications    Scheduled Meds:  aspirin  81 mg Oral Daily   atorvastatin  10 mg Oral Daily   Chlorhexidine Gluconate Cloth  6 each Topical Q0600   diltiazem  120 mg Oral Daily   famotidine  20 mg Oral Daily   hydrochlorothiazide  25 mg Oral Daily   irbesartan  150 mg Oral Daily   metoprolol succinate  50 mg Oral Daily   mupirocin ointment   Nasal BID   pantoprazole  40 mg Oral Q1200   sodium chloride flush  3 mL Intravenous Q12H   sodium chloride flush  3 mL Intravenous Q12H   Continuous Infusions:  sodium chloride     sodium chloride 10 mL/hr at 04/19/21 0700    ceFAZolin (ANCEF) IV Stopped (04/20/21 0551)   nitroGLYCERIN     phenylephrine (NEO-SYNEPHRINE) Adult infusion     PRN Meds: sodium chloride, acetaminophen **OR** acetaminophen, ALPRAZolam, HYDROcodone-acetaminophen, methocarbamol, morphine injection, ondansetron (ZOFRAN) IV, sodium chloride flush   Vital Signs    Vitals:   04/20/21 0530 04/20/21 0600 04/20/21 0700 04/20/21 0831  BP:      Pulse: (!) 59 60 60   Resp: '18 20 12   '$ Temp:    98.4 F (36.9 C)  TempSrc:    Oral  SpO2: 95% 96% 95%   Weight:      Height:        Intake/Output Summary (Last 24 hours) at 04/20/2021 0853 Last data filed at 04/20/2021 0600 Gross per 24 hour  Intake 235.51 ml  Output 2025 ml  Net -1789.49 ml   Last 3 Weights 04/20/2021 04/18/2021 04/14/2021  Weight (lbs) 429 lb 14.4 oz 425 lb 476 lb 9.6 oz  Weight (kg) 195 kg 192.779 kg 216.184 kg      Telemetry    AFib , V paced - Personally Reviewed  ECG    AFib, V paced - Personally Reviewed  Physical Exam   GEN: No acute distress.   Neck: No JVD Cardiac: RRR, no murmurs, rubs, or gallops.  Respiratory: Clear to auscultation bilaterally. GI: Soft, nontender, non-distended  MS: No  edema; No deformity. Neuro:  Nonfocal  Psych: Normal affect   Pacer site is stable, no hematoma, bleeding  Labs    High Sensitivity Troponin:  No results for input(s): TROPONINIHS in the last 720 hours.    Chemistry Recent Labs  Lab 04/14/21 1530 04/18/21 0950 04/19/21 0418 04/20/21 0024  NA 135 135 133* 133*  K 5.0 4.6 4.0 4.0  CL 103 100 101 99  CO2 21*  --  24 23  GLUCOSE 91 118* 157* 96  BUN 22 29* 18 18  CREATININE 1.10 1.10 1.02 1.03  CALCIUM 9.1  --  9.0 8.8*  PROT 7.3  --   --   --   ALBUMIN 3.8  --   --   --   AST 35  --   --   --   ALT 27  --   --   --   ALKPHOS 68  --   --   --   BILITOT 1.6*  --   --   --   GFRNONAA >60  --  >60 >60  ANIONGAP 11  --  8 11     Hematology Recent Labs  Lab 04/14/21 1530 04/18/21 0950 04/19/21 0418 04/20/21 0024  WBC 11.8*  --  10.9* 13.7*  RBC 4.78  --  4.50 4.77  HGB 14.8 14.3 13.9 14.6  HCT 45.3 42.0 40.7 43.6  MCV 94.8  --  90.4 91.4  MCH 31.0  --  30.9 30.6  MCHC 32.7  --  34.2 33.5  RDW 14.5  --  14.0 14.1  PLT 216  --  176 176    BNPNo results for input(s): BNP, PROBNP in the last 168 hours.   DDimer No results for input(s): DDIMER in the last 168 hours.   Radiology      Cardiac Studies   04/19/21: TTE IMPRESSIONS   1. Left ventricular ejection fraction, by estimation, is 60 to 65%. The  left ventricle has normal function. The left ventricle has no regional  wall motion abnormalities. There is mild concentric left ventricular  hypertrophy. Left ventricular diastolic  function could not be evaluated.   2. Right ventricular systolic function is normal. The right ventricular  size is normal. There is mildly elevated pulmonary artery systolic  pressure.   3. Left atrial size was severely dilated.   4. The mitral valve is normal in structure. No evidence of mitral valve  regurgitation.   5. The aortic valve has been repaired/replaced. Aortic valve  regurgitation is trivial. There is a 29 mm Edwards  Ultra, stented (TAVR)  valve present in the aortic position. Procedure Date: 04/18/21. Echo  findings are consistent with normal structure and  function of the aortic valve prosthesis. Echo findings are consistent with  trace perivalvular leak of the aortic prosthesis. Aortic valve mean  gradient measures 8.0 mmHg. Aortic valve Vmax measures 1.98 m/s. Aortic  valve acceleration time measures 92  msec.   6. The inferior vena cava is dilated in size with <50% respiratory  variability, suggesting right atrial pressure of 15 mmHg.   Comparison(s): Prior images reviewed side by side. The perivalvular leak  is barely visible on the current transthoracic study.    03/23/21; LHC No angiographic evidence of CAD Severe aortic stenosis (mean gradient 41.3 mmHg, peak to peak gradient 62 mmHg)    Patient Profile     65 y.o. male  hx of AFib (described as persistent),super morbid obesity, OSA, HTN, prior stroke, VHD w/severe AS admitted for TAVR, post procedure CHB  Assessment & Plan    Severe AS Post op CHB   S/p PPM implant yesterday with Dr. Curt Bears Site is stable CXR with stable lead position this AM, await official radiology read Device check this AM with stable function (no underling R waves) Post pacing follow up is in place Wound care and activity restrictions d/w the patient.  OK to resume [place] today OK to discharge from EP perspective if CXR is negative for ptx (discussed with structural team APP)   Dr. Curt Bears has seen and examined the patient EP Margrett Kalb sign off though remain available, please recall if needed    For questions or updates, please contact [place] Please consult [email or web address] for contact info under        Signed, Baldwin Jamaica, PA-C  04/20/2021, 8:53 AM    I have seen and examined this patient with Tommye Standard.  Agree with above, note added to reflect my findings.  On exam, RRR, no murmurs.  She is now status post [employer] dual-chamber pacemaker for  complete heart block post TAVR.  Device functioning appropriately.  Chest x-ray and interrogation without issue.  Okay for discharge today with follow-up in device clinic.  Bev Drennen M. Rosalynn Sergent MD 04/20/2021 11:55 AM

## 2021-04-20 NOTE — Discharge Summary (Addendum)
[name] VALVE TEAM  Discharge Summary    Patient ID: AEDRIC [name] MRN: [id number]; DOB: [birth year]  Admit date: 04/18/2021 Discharge date: 04/20/2021  Primary Care Provider: Denita Lung, MD  Primary Cardiologist: [name], MD / Dr. Angelena Form & Dr. Cyndia Bent (TAVR)  Discharge Diagnoses    Principal Problem:   S/P TAVR (transcatheter aortic valve replacement) Active Problems:   PAF (paroxysmal atrial fibrillation) (HCC)   OSA treated with BiPAP   RBBB   HTN (hypertension)   Morbid obesity due to excess calories ([name])   History of CVA (cerebrovascular accident)   Severe aortic stenosis   S/P placement of cardiac pacemaker   Heart block AV complete (HCC)   Allergies Allergies  Allergen Reactions   Codeine Shortness Of Breath and Swelling    Patient has tolerated hydrocodone and Dilaudid.   Morphine And Related Shortness Of Breath and Swelling    Patient has tolerated hydrocodone and Dilaudid.     Diagnostic Studies/Procedures      TAVR OPERATIVE NOTE     Date of Procedure:                04/18/2021   Preoperative Diagnosis:      Severe Aortic Stenosis    Postoperative Diagnosis:    Same    Procedure:        Transcatheter Aortic Valve Replacement - Percutaneous Right Transfemoral Approach             Edwards Sapien 3  THV (size 29 mm, model # 9600TFX, serial # [id number])              Co-Surgeons:                        Gaye Pollack, MD and Lauree Chandler, MD     Anesthesiologist:                  Tamela Gammon, MD   Echocardiographer:              Sanda Klein, MD   Pre-operative Echo Findings: Severe aortic stenosis Normal left ventricular systolic function   Post-operative Echo Findings: trivial paravalvular leak Normal left ventricular systolic function   _____________    Echo 04/19/21:  IMPRESSIONS   1. Left ventricular ejection fraction, by estimation, is 60 to 65%. The  left ventricle has  normal function. The left ventricle has no regional  wall motion abnormalities. There is mild concentric left ventricular  hypertrophy. Left ventricular diastolic  function could not be evaluated.   2. Right ventricular systolic function is normal. The right ventricular  size is normal. There is mildly elevated pulmonary artery systolic  pressure.   3. Left atrial size was severely dilated.   4. The mitral valve is normal in structure. No evidence of mitral valve  regurgitation.   5. The aortic valve has been repaired/replaced. Aortic valve  regurgitation is trivial. There is a 29 mm Edwards Ultra, stented (TAVR)  valve present in the aortic position. Procedure Date: 04/18/21. Echo  findings are consistent with normal structure and  function of the aortic valve prosthesis. Echo findings are consistent with  trace perivalvular leak of the aortic prosthesis. Aortic valve mean  gradient measures 8.0 mmHg. Aortic valve Vmax measures 1.98 m/s. Aortic  valve acceleration time measures 92  msec.   6. The inferior vena cava is dilated in size with <50% respiratory  variability,  suggesting right atrial pressure of 15 mmHg.   Comparison(s): Prior images reviewed side by side. The perivalvular leak  is barely visible on the current transthoracic study.   ___________________  04/19/21 PACEMAKER IMPLANT   Conclusion  SURGEON:  Will Meredith Leeds, MD     PREPROCEDURE DIAGNOSIS:  complete AV block    POSTPROCEDURE DIAGNOSIS:  complete AV block     PROCEDURES:   1. Pacemaker implantation.     INTRODUCTION: [name] is a 65 y.o. male  with a history of aortic stenosis post TAVR with heart block post procedure.  No reversible causes have been identified.  The patient therefore presents today for pacemaker implantation.   CONCLUSIONS:   1. Successful implantation of a Medtronic Azure XT DR MRI SureScan dual-chamber pacemaker for complete AV block  2. No early apparent complications.         History of Present Illness     David [name] is a 66 y.o. male with a history of HTN, HLD, prior CVA, super morbid obesity (BMI 54), persistent atrial fibrillation on Xarelto, RBBB, sleep apnea, former tobacco abuse and severe aortic stenosis  who presented to [facility] on 04/18/21 for planned TAVR.   He has been maintained on Xarelto for his paroxysmal atrial fibrillation. Dr. Debara Pickett has been following his aortic stenosis with periodic echocardiograms.  His most recent echo on 03/02/2021 showed a trileaflet aortic valve with severe calcification and thickening and restricted leaflet mobility.  The mean gradient was 49 mmHg with a valve area by VTI of 0.67 cm consistent with severe aortic stenosis. Left ventricular ejection fraction was 60 to 65%. [facility] 03/23/21 showed no CAD and severe AS with a mean gradient of 41.3 mm hg and peak gradient of 62 mm hg. The patient had poor dentition and underwent dental extractions on 04/11/21.  The patient has been evaluated by the multidisciplinary valve team and felt to have severe, symptomatic aortic stenosis and to be a suitable candidate for TAVR, which was set up for 04/18/21.   Hospital Course     Consultants: EP   Severe AS: s/p successful TAVR with a 29 mm Edwards Sapien 3 Ultra THV via the TF approach on 04/18/21. Post operative echo showed EF 60%, normally functioning TAVR with a mean gradient of 8 mmHg and trivial PVL. Groin sites are stable. Resumed home anticoagulation Xarelto with the addition of a baby aspirin x 6 months. Plan for discharge home today with close follow up in the office next week.   CHB: pt developed CHB after valve deployment and was pacer dependant. He is now s/p successful implantation of a Medtronic Azure XR DR MRI SureScan dual chamber pacemaker on 04/19/21 by Dr. Curt Bears.   HTN: BP has been elevated. His home AV nodal blocking agents were held in the setting of CHB. All his home meds have been resumed now that he is s/p  PPM  Persistent atrial fibrillation: CHA2DS2Vasc is 3 with prior stroke. In review of Dr. Lysbeth Penner notes, seems patient intermittently has had awareness of his Afib, does not seem that of late at least that there has been discussion on rhythm strategy. EP discussed with Dr. Debara Pickett who hopes that post TAVR he feels better and is able to become more ambulatory and lose some weight, with a goal if able down the line to restore SR if able. Will resume home Xarelto.  Morbid obesity: Body mass index is 53.73 kg/m. As above, work on diet and exercise.  _____________  Discharge Vitals Blood pressure (!) 156/74, pulse 60, temperature 98.4 F (36.9 C), temperature source Oral, resp. rate 12, height '6\' 3"'$  (1.905 m), weight (!) 195 kg, SpO2 95 %.  Filed Weights   04/18/21 0721 04/20/21 0500  Weight: (!) 192.8 kg (!) 195 kg    GEN: Well nourished, well developed, in no acute distress, obese white male HEENT: normal Neck: no JVD or masses Cardiac: RRR; soft flow murmur. No rubs, or gallops,no edema  Respiratory:  clear to auscultation bilaterally, normal work of breathing GI: soft, nontender, nondistended, + BS MS: no deformity or atrophy Skin: warm and dry, no rash.  Groin sites clear without hematoma or ecchymosis  Neuro:  Alert and Oriented x 3, Strength and sensation are intact Psych: euthymic mood, full affect   Labs & Radiologic Studies    CBC Recent Labs    04/19/21 0418 04/20/21 0024  WBC 10.9* 13.7*  HGB 13.9 14.6  HCT 40.7 43.6  MCV 90.4 91.4  PLT 176 0000000   Basic Metabolic Panel Recent Labs    04/19/21 0418 04/20/21 0024  NA 133* 133*  K 4.0 4.0  CL 101 99  CO2 24 23  GLUCOSE 157* 96  BUN 18 18  CREATININE 1.02 1.03  CALCIUM 9.0 8.8*  MG 2.1  --    Liver Function Tests No results for input(s): AST, ALT, ALKPHOS, BILITOT, PROT, ALBUMIN in the last 72 hours. No results for input(s): LIPASE, AMYLASE in the last 72 hours. Cardiac Enzymes No results for input(s):  CKTOTAL, CKMB, CKMBINDEX, TROPONINI in the last 72 hours. BNP Invalid input(s): POCBNP D-Dimer No results for input(s): DDIMER in the last 72 hours. Hemoglobin A1C No results for input(s): HGBA1C in the last 72 hours. Fasting Lipid Panel No results for input(s): CHOL, HDL, LDLCALC, TRIG, CHOLHDL, LDLDIRECT in the last 72 hours. Thyroid Function Tests No results for input(s): TSH, T4TOTAL, T3FREE, THYROIDAB in the last 72 hours.  Invalid input(s): FREET3 _____________  DG Chest 2 View  Result Date: 04/14/2021 CLINICAL DATA:  65 year old male with pending TAVR EXAM: CHEST - 2 VIEW COMPARISON:  06/21/2016 FINDINGS: Cardiomediastinal silhouette unchanged in size and contour. No evidence of central vascular congestion. No interlobular septal thickening. Low lung volumes coarsened interstitial markings, and no new confluent airspace disease. No pneumothorax or pleural effusion. No acute displaced fracture. Degenerative changes of the spine. IMPRESSION: No evidence of acute cardiopulmonary disease Electronically Signed   By: Corrie Mckusick D.O.   On: 04/14/2021 16:19   CARDIAC CATHETERIZATION  Result Date: 03/23/2021 No angiographic evidence of CAD Severe aortic stenosis (mean gradient 41.3 mmHg, peak to peak gradient 62 mmHg) Will continue workup for TAVR.   EP PPM/ICD IMPLANT  Result Date: 04/19/2021 SURGEON:  Will Meredith Leeds, MD   PREPROCEDURE DIAGNOSIS:  complete AV block   POSTPROCEDURE DIAGNOSIS:  complete AV block    PROCEDURES:  1. Pacemaker implantation.   INTRODUCTION: David [name] is a 65 y.o. male  with a history of aortic stenosis post TAVR with heart block post procedure.  No reversible causes have been identified.  The patient therefore presents today for pacemaker implantation.   DESCRIPTION OF PROCEDURE:  Informed written consent was obtained, and  the patient was brought to the electrophysiology lab in a fasting state.  The patient required no sedation for the procedure today.   The patients left chest was prepped and draped in the usual sterile fashion by the EP lab staff. The skin overlying the  left deltopectoral region was infiltrated with lidocaine for local analgesia.  A 4-cm incision was made over the left deltopectoral region.  A left subcutaneous pacemaker pocket was fashioned using a combination of sharp and blunt dissection. Electrocautery was required to assure hemostasis.  RA/RV Lead Placement: The left axillary vein was cannulated.  Through the left axillary vein, a Medtronic model 5076 (serial number [id number]) right atrial lead and a Medtronic model 5076 (serial number [id number]) right ventricular lead were advanced with fluoroscopic visualization into the right atrial appendage and right ventricular apex positions respectively.  Initial atrial lead P- waves measured 54m with impedance of 705 ohms in atrial fibrillation.  Right ventricular lead R-waves measured 8 mV with an impedance of 615 ohms and a threshold of 0.6 V at 0.5 msec.  Both leads were secured to the pectoralis fascia using #2-0 silk over the suture sleeves. Device Placement:  The leads were then connected to a Medtronic Azure XT DR MRI SureScan (serial number [id number]) pacemaker.  The pocket was irrigated with copious gentamicin solution.  The pacemaker was then placed into the pocket.  The pocket was then closed in 3 layers with 2.0 Vicryl suture for the subcutaneous and 3.0 Vicryl suture subcuticular layers.  Steri-Strips and a sterile dressing were then applied. EBL<136m There were no early apparent complications.   CONCLUSIONS:  1. Successful implantation of a Medtronic Azure XT DR MRI SureScan dual-chamber pacemaker for complete AV block  2. No early apparent complications.       Will MaMeredith LeedsMD 04/19/2021 3:26 PM   CT CORONARY MORPH W/CTA COR W/SCORE W/CA W/CM &/OR WO/CM  Addendum Date: 03/30/2021   ADDENDUM REPORT: 03/30/2021 08:59 ADDENDUM: Please see separate dictation for  contemporaneously obtained CTA chest, abdomen and pelvis dated 03/30/2021 for full description of relevant extracardiac findings. Electronically Signed   By: DaVinnie Langton.D.   On: 03/30/2021 08:59   Result Date: 03/30/2021 CLINICAL DATA:  Aortic Stenosis EXAM: Cardiac TAVR CT TECHNIQUE: The patient was scanned on a Siemens Force [id number] scanner. A 120 kV retrospective scan was triggered in the ascending thoracic aorta at 140 HU's. Gantry rotation speed was 250 msecs and collimation was .6 mm. No beta blockade or nitro were given. The 3D data set was reconstructed in 5% intervals of the R-R cycle. Systolic and diastolic phases were analyzed on a dedicated work station using MPR, MIP and VRT modes. The patient received 80 cc of contrast. FINDINGS: Aortic Valve: Tri leaflet calcified with restricted motion calcium score 5500 Aorta: Moderate calcific atherosclerosis normal arch vessels Sino-tubular Junction: 32 mm Ascending Thoracic Aorta: 37 mm Descending Thoracic Aorta: 29 mm Sinus of Valsalva Measurements: Non-coronary: 33.4 mm Right - coronary: 33.8 mm Left -   coronary: 39.27 mm Coronary Artery Height above Annulus: Left Main: 13.5 mm above annulus Right Coronary: 13.2 mm above annulus Virtual Basal Annulus Measurements: Maximum / Minimum Diameter: 22.4 x 29 mm Perimeter: 91.2 mm Area: 567 mm2 Coronary Arteries: Sufficient height above annulus for deployment Optimum Fluoroscopic Angle for Delivery: LAO 12 Cranial 4 degrees IMPRESSION: Sub optimal study due to patients size with very poor opacification of aorta 1. Calcified tri leaflet AV with calcium score 5500 2. Optimum angiographic angle for deployment LAO 12 Cranial 4 degrees 3. Aortic annulus 567 mm2 should be suitable for a 29 mm Sapien 3 valve 4.  Normal aortic root 3.7 cm 5.  Coronary arteries sufficient height above annulus for deployment PeJenkins Rougelectronically Signed:  By: Jenkins Rouge M.D. On: 03/29/2021 12:13   CT ANGIO CHEST AORTA W/CM  & OR WO/CM  Result Date: 03/30/2021 CLINICAL DATA:  65 year old male with history of severe aortic stenosis. Preprocedural study prior to potential transcatheter aortic valve replacement (TAVR) procedure. EXAM: CT ANGIOGRAPHY CHEST, ABDOMEN AND PELVIS TECHNIQUE: Multidetector CT imaging through the chest, abdomen and pelvis was performed using the standard protocol during bolus administration of intravenous contrast. Multiplanar reconstructed images and MIPs were obtained and reviewed to evaluate the vascular anatomy. CONTRAST:  164m OMNIPAQUE IOHEXOL 350 MG/ML SOLN COMPARISON:  No priors. FINDINGS: CTA CHEST FINDINGS Cardiovascular: Heart size is borderline enlarged. There is no significant pericardial fluid, thickening or pericardial calcification. There is aortic atherosclerosis, as well as atherosclerosis of the great vessels of the mediastinum and the coronary arteries, including calcified atherosclerotic plaque in the left anterior descending and right coronary arteries. Severe calcifications of the aortic valve. Mild calcifications of the mitral annulus. Mediastinum/Lymph Nodes: No pathologically enlarged mediastinal or hilar lymph nodes. Esophagus is unremarkable in appearance. No axillary lymphadenopathy. Lungs/Pleura: No suspicious appearing pulmonary nodules or masses are noted. No acute consolidative airspace disease. No pleural effusions. Diffuse bronchial wall thickening with moderate centrilobular and mild paraseptal emphysema. Musculoskeletal/Soft Tissues: There are no aggressive appearing lytic or blastic lesions noted in the visualized portions of the skeleton. CTA ABDOMEN AND PELVIS FINDINGS Hepatobiliary: Liver has a shrunken appearance and nodular contour, indicative of advanced cirrhosis. No discrete cystic or solid hepatic lesions are confidently identified. No intra or extrahepatic biliary ductal dilatation. Small calcified gallstones lying dependently in the gallbladder. No findings to  suggest an acute cholecystitis are noted at this time. Pancreas: No pancreatic mass. No pancreatic ductal dilatation. No pancreatic or peripancreatic fluid collections or inflammatory changes. Spleen: Unremarkable. Adrenals/Urinary Tract: Bilateral kidneys and adrenal glands are normal in appearance. No hydroureteronephrosis. Urinary bladder is nearly completely decompressed, but otherwise unremarkable in appearance. Stomach/Bowel: Normal appearance of the stomach. No pathologic dilatation of small bowel or colon. Normal appendix. Vascular/Lymphatic: Aortic atherosclerosis, without evidence of aneurysm or dissection in the abdominal or pelvic vasculature. Vascular findings and measurements pertinent to potential TAVR procedure, as detailed below. No lymphadenopathy noted in the abdomen or pelvis. Reproductive: Prostate gland and seminal vesicles are unremarkable in appearance. Other: No significant volume of ascites.  No pneumoperitoneum. Musculoskeletal: There are no aggressive appearing lytic or blastic lesions noted in the visualized portions of the skeleton. VASCULAR MEASUREMENTS PERTINENT TO TAVR: Comment: Suboptimal arterial opacification secondary to suboptimal contrast bolus severely limits the diagnostic quality of today's examination. Reported measurements below are considered best estimates given the nearly nondiagnostic quality of today's examination. AORTA: Minimal Aortic Diameter-20 x 15 mm Severity of Aortic Calcification-moderate RIGHT PELVIS: Right Common Iliac Artery - Minimal Diameter-15.3 x 15.4 mm Tortuosity-mild Calcification-moderate Right External Iliac Artery - Minimal Diameter-7.8 x 8.9 mm Tortuosity - mild Calcification-mild Right Common Femoral Artery - Minimal Diameter-8.5 x 7.5 mm Tortuosity - mild Calcification-mild LEFT PELVIS: Left Common Iliac Artery - Minimal Diameter-15.0 x 12.7 mm Tortuosity - mild Calcification-moderate Left External Iliac Artery - Minimal Diameter-10.6 x 9.3 mm  Tortuosity - mild Calcification-mild Left Common Femoral Artery - Minimal Diameter-9.8 x 7.8 mm Tortuosity - mild Calcification-mild Review of the MIP images confirms the above findings. IMPRESSION: 1. Suboptimal examination (nearly nondiagnostic) for assessment of the pelvic arteries, with best estimate of vascular findings and measurements pertinent to potential TAVR procedure, as detailed above. 2. Severe calcifications of the aortic valve, compatible with reported clinical  history of severe aortic stenosis. 3. Aortic atherosclerosis, in addition to 2 vessel coronary artery disease. Please note that although the presence of coronary artery calcium documents the presence of coronary artery disease, the severity of this disease and any potential stenosis cannot be assessed on this non-gated CT examination. Assessment for potential risk factor modification, dietary therapy or pharmacologic therapy may be warranted, if clinically indicated. 4. Diffuse bronchial wall thickening with moderate centrilobular and mild paraseptal emphysema; imaging findings suggestive of underlying COPD. 5. Cirrhosis. 6. Cholelithiasis without evidence of acute cholecystitis at this time. 7. Additional incidental findings, as above. Electronically Signed   By: Vinnie Langton M.D.   On: 03/30/2021 10:21   ECHOCARDIOGRAM COMPLETE  Result Date: 04/19/2021    ECHOCARDIOGRAM REPORT   Patient Name:   Hillery DANTRELL VANROSSUM Date of Exam: 04/19/2021 Medical Rec #:  [id number]    Height:       75.0 in Accession #:    [id number]   Weight:       425.0 lb Date of Birth:  1956-08-18     BSA:          3.023 m Patient Age:    75 years     BP:           129/66 mmHg Patient Gender: M            HR:           60 bpm. Exam Location:  Inpatient Procedure: 2D Echo Indications:    status post TAVR  History:        Patient has prior history of Echocardiogram examinations, most                 recent 04/18/2021. External pacemaker, Arrythmias:RBBB; Risk                  Factors:Sleep Apnea and Hypertension.                 Aortic Valve: 29 mm Edwards Ultra, stented (TAVR) valve is                 present in the aortic position. Procedure Date: 04/18/21.  Sonographer:    Johny Chess RDCS Referring Phys: [id number] [place]  Sonographer Comments: Patient is morbidly obese. Image acquisition challenging due to patient body habitus. IMPRESSIONS  1. Left ventricular ejection fraction, by estimation, is 60 to 65%. The left ventricle has normal function. The left ventricle has no regional wall motion abnormalities. There is mild concentric left ventricular hypertrophy. Left ventricular diastolic function could not be evaluated.  2. Right ventricular systolic function is normal. The right ventricular size is normal. There is mildly elevated pulmonary artery systolic pressure.  3. Left atrial size was severely dilated.  4. The mitral valve is normal in structure. No evidence of mitral valve regurgitation.  5. The aortic valve has been repaired/replaced. Aortic valve regurgitation is trivial. There is a 29 mm Edwards Ultra, stented (TAVR) valve present in the aortic position. Procedure Date: 04/18/21. Echo findings are consistent with normal structure and function of the aortic valve prosthesis. Echo findings are consistent with trace perivalvular leak of the aortic prosthesis. Aortic valve mean gradient measures 8.0 mmHg. Aortic valve Vmax measures 1.98 m/s. Aortic valve acceleration time measures 92 msec.  6. The inferior vena cava is dilated in size with <50% respiratory variability, suggesting right atrial pressure of 15 mmHg. Comparison(s): Prior images reviewed side by side. The perivalvular leak is barely  visible on the current transthoracic study. FINDINGS  Left Ventricle: Left ventricular ejection fraction, by estimation, is 60 to 65%. The left ventricle has normal function. The left ventricle has no regional wall motion abnormalities. The left ventricular internal cavity  size was normal in size. There is  mild concentric left ventricular hypertrophy. Left ventricular diastolic function could not be evaluated due to paced rhythm. Left ventricular diastolic function could not be evaluated. Right Ventricle: The right ventricular size is normal. No increase in right ventricular wall thickness. Right ventricular systolic function is normal. There is mildly elevated pulmonary artery systolic pressure. The tricuspid regurgitant velocity is 2.43  m/s, and with an assumed right atrial pressure of 15 mmHg, the estimated right ventricular systolic pressure is 123456 mmHg. Left Atrium: Left atrial size was severely dilated. Right Atrium: Right atrial size was not well visualized. Pericardium: There is no evidence of pericardial effusion. Mitral Valve: The mitral valve is normal in structure. Mild mitral annular calcification. No evidence of mitral valve regurgitation. Tricuspid Valve: The tricuspid valve is normal in structure. Tricuspid valve regurgitation is trivial. Aortic Valve: The aortic valve has been repaired/replaced. Aortic valve regurgitation is trivial. Aortic valve mean gradient measures 8.0 mmHg. Aortic valve peak gradient measures 15.7 mmHg. Aortic valve area, by VTI measures 2.11 cm. There is a 29 mm Edwards Ultra, stented (TAVR) valve present in the aortic position. Procedure Date: 04/18/21. Echo findings are consistent with normal structure and function of the aortic valve prosthesis. Pulmonic Valve: The pulmonic valve was grossly normal. Pulmonic valve regurgitation is not visualized. Aorta: The aortic root and ascending aorta are structurally normal, with no evidence of dilitation. Venous: The inferior vena cava is dilated in size with less than 50% respiratory variability, suggesting right atrial pressure of 15 mmHg. IAS/Shunts: No atrial level shunt detected by color flow Doppler. Additional Comments: A device lead is visualized in the right ventricle and inferior vena cava.   LEFT VENTRICLE PLAX 2D LVIDd:         5.30 cm  Diastology LVIDs:         3.40 cm  LV e' medial:  8.70 cm/s LV PW:         1.30 cm  LV e' lateral: 9.03 cm/s LV IVS:        1.20 cm LVOT diam:     1.95 cm LV SV:         92 LV SV Index:   30 LVOT Area:     2.99 cm  RIGHT VENTRICLE             IVC RV S prime:     13.60 cm/s  IVC diam: 3.00 cm TAPSE (M-mode): 2.1 cm LEFT ATRIUM              Index LA diam:        5.40 cm  1.79 cm/m LA Vol (A2C):   83.0 ml  27.46 ml/m LA Vol (A4C):   107.0 ml 35.40 ml/m LA Biplane Vol: 102.0 ml 33.74 ml/m  AORTIC VALVE AV Area (Vmax):    2.10 cm AV Area (Vmean):   2.17 cm AV Area (VTI):     2.11 cm AV Vmax:           198.00 cm/s AV Vmean:          131.000 cm/s AV VTI:            0.434 m AV Peak Grad:      15.7 mmHg AV  Mean Grad:      8.0 mmHg LVOT Vmax:         139.50 cm/s LVOT Vmean:        95.200 cm/s LVOT VTI:          0.306 m LVOT/AV VTI ratio: 0.71  AORTA Ao Asc diam: 4.00 cm TRICUSPID VALVE TR Peak grad:   23.6 mmHg TR Vmax:        243.00 cm/s  SHUNTS Systemic VTI:  0.31 m Systemic Diam: 1.95 cm Sanda Klein MD Electronically signed by Sanda Klein MD Signature Date/Time: 04/19/2021/11:43:38 AM    Final    ECHO TEE  Result Date: 04/18/2021    TRANSESOPHOGEAL ECHO REPORT   Patient Name:   David [name] Date of Exam: 04/18/2021 Medical Rec #:  [id number]    Height:       75.0 in Accession #:    [id number]   Weight:       425.0 lb Date of Birth:  [birth year]     BSA:          3.023 m Patient Age:    87 years     BP:           150/62 mmHg Patient Gender: M            HR:           81 bpm. Exam Location:  Inpatient Procedure: Transesophageal Echo, Cardiac Doppler and Color Doppler Indications:     Aortic Stenosis  History:         Patient has prior history of Echocardiogram examinations, most                  recent 03/03/2021. Stroke, Aortic Valve Disease,                  Arrythmias:Atrial Fibrillation and RBBB, Signs/Symptoms:Murmur;                  Risk Factors:Hypertension  and Dyslipidemia. GERD.                  Aortic Valve: 29 mm Sapien prosthetic, stented (TAVR) valve is                  present in the aortic position. Procedure Date: 04/18/2021.  Sonographer:     Darlina Sicilian RDCS Referring Phys:  [place] Diagnosing Phys: Sanda Klein MD PROCEDURE: After discussion of the risks and benefits of a TEE, an informed consent was obtained from the patient. The patient was intubated. The transesophogeal probe was passed without difficulty through the esophogus of the patient. Imaged were obtained with the patient in a supine position. Sedation performed by different physician. The patient was monitored while under deep sedation. Anesthestetic sedation was provided intravenously by Anesthesiology: '200mg'$  of Propofol, '100mg'$  of Lidocaine. Image quality was good. The patient's vital signs; including heart rate, blood pressure, and oxygen saturation; remained stable throughout the procedure. The patient developed no complications during the procedure. PREOPERATIVE FINDINGS: Normal left ventricular systolic function and regional wall motion. EF 60%. Bicuspid aortic valve with bulky calcification, severe aortic stenosis and mild aortic insufficiency. Peak gradient 60 mm Hg, mean gradient 29 mm Hg, dimensionless index 0.3, aortic valve area 0.9 cm (0.3 cm2 /m2 BSA). Mild mitral insufficency. No pericardial effusion. POSTOPERATIVE FINDINGS: Normal left ventricular systolic function and regional wall motion. EF 60% Well seated TAVR stent valve. There is mild aortic insufficiency, along the arc between the atrial  septum and left coronary ostium. Despite baloon reinflation at larger volume, the perivalvular leak persisted unchanged. Full stent cage expansion is impeded by bulky calcification posterior to the annulus, towards the transverse pericardial sinus. Peak gradient 6 mm Hg, mean gradient 3 mm Hg, dimensionless index 0.57, aortic valve area 2.3 cm (0.77 cm2 /m2 BSA).  Acceleration time 96 ms. Mild mitral insufficency. No pericardial effusion.  IMPRESSIONS  1. Left ventricular ejection fraction, by estimation, is 60 to 65%. The left ventricle has normal function. The left ventricle has no regional wall motion abnormalities. There is mild concentric left ventricular hypertrophy. Left ventricular diastolic function could not be evaluated.  2. Right ventricular systolic function is normal. The right ventricular size is normal. There is normal pulmonary artery systolic pressure.  3. Left atrial size was moderately dilated. No left atrial/left atrial appendage thrombus was detected.  4. The mitral valve is normal in structure. Mild mitral valve regurgitation.  5. Sievers one type valve (fused left and right cusps). The aortic valve is bicuspid. There is severe calcifcation of the aortic valve. There is severe thickening of the aortic valve. Aortic valve regurgitation is mild. There is a 29 mm Sapien prosthetic (TAVR) valve present in the aortic position. Procedure Date: 04/18/2021. Aortic valve mean gradient measures 3.2 mmHg. Aortic valve Vmax measures 1.29 m/s. Aortic valve acceleration time measures 96 msec.  6. There is borderline dilatation of the ascending aorta, measuring 38 mm. There is mild (Grade II) protruding plaque involving the descending aorta. FINDINGS  Left Ventricle: Left ventricular ejection fraction, by estimation, is 60 to 65%. The left ventricle has normal function. The left ventricle has no regional wall motion abnormalities. The left ventricular internal cavity size was normal in size. There is  mild concentric left ventricular hypertrophy. Left ventricular diastolic function could not be evaluated. Right Ventricle: The right ventricular size is normal. No increase in right ventricular wall thickness. Right ventricular systolic function is normal. There is normal pulmonary artery systolic pressure. The tricuspid regurgitant velocity is 2.19 m/s, and  with an  assumed right atrial pressure of 5 mmHg, the estimated right ventricular systolic pressure is [id number] mmHg. Left Atrium: Left atrial size was moderately dilated. Spontaneous echo contrast was present. No left atrial/left atrial appendage thrombus was detected. Right Atrium: Right atrial size was normal in size. Pericardium: There is no evidence of pericardial effusion. Mitral Valve: The mitral valve is normal in structure. Mild to moderate mitral annular calcification. Mild mitral valve regurgitation. Tricuspid Valve: The tricuspid valve is normal in structure. Tricuspid valve regurgitation is mild. Aortic Valve: Sievers one type valve (fused left and right cusps). The aortic valve is bicuspid. There is severe calcifcation of the aortic valve. There is severe thickening of the aortic valve. Aortic valve regurgitation is mild. Aortic valve mean gradient measures 3.2 mmHg. Aortic valve peak gradient measures 6.7 mmHg. Aortic valve area, by VTI measures 2.32 cm. There is a 29 mm Sapien prosthetic, stented (TAVR) valve present in the aortic position. Procedure Date: 04/18/2021. Pulmonic Valve: The pulmonic valve was normal in structure. Pulmonic valve regurgitation is trivial. Aorta: The aortic root, ascending aorta and aortic arch are all structurally normal, with no evidence of dilitation or obstruction. There is borderline dilatation of the ascending aorta, measuring 38 mm. There is mild (Grade II) protruding plaque involving the descending aorta. IAS/Shunts: No atrial level shunt detected by color flow Doppler.  LEFT VENTRICLE PLAX 2D LVOT diam:     2.27 cm LV SV:  58 LV SV Index:   19 LVOT Area:     4.05 cm  AORTIC VALVE AV Area (Vmax):    2.43 cm AV Area (Vmean):   2.74 cm AV Area (VTI):     2.32 cm AV Vmax:           129.19 cm/s AV Vmean:          79.878 cm/s AV VTI:            0.249 m AV Peak Grad:      6.7 mmHg AV Mean Grad:      3.2 mmHg LVOT Vmax:         77.70 cm/s LVOT Vmean:        54.037 cm/s  LVOT VTI:          0.143 m LVOT/AV VTI ratio: 0.57 TRICUSPID VALVE TR Peak grad:   19.2 mmHg TR Vmax:        219.00 cm/s  SHUNTS Systemic VTI:  0.14 m Systemic Diam: 2.27 cm Sanda Klein MD Electronically signed by Sanda Klein MD Signature Date/Time: 04/18/2021/2:22:19 PM    Final    VAS US CAROTID  Result Date: 03/29/2021 Carotid Arterial Duplex Study Patient Name:  David [name]  Date of Exam:   03/29/2021 Medical Rec #: [id number]     Accession #:    [id number] Date of Birth: 11-Jun-1956      Patient Gender: M Patient Age:   68 years Exam Location:  [facility] Procedure:      VAS US CAROTID Referring Phys: Lauree Chandler --------------------------------------------------------------------------------  Indications:       Pre tavr. Risk Factors:      Hypertension, hyperlipidemia. Limitations        Today's exam was limited due to the body habitus of the                    patient. Comparison Study:  no prior Performing Technologist: Archie Patten RVS  Examination Guidelines: A complete evaluation includes B-mode imaging, spectral Doppler, color Doppler, and power Doppler as needed of all accessible portions of each vessel. Bilateral testing is considered an integral part of a complete examination. Limited examinations for reoccurring indications may be performed as noted.  Right Carotid Findings: +----------+--------+--------+--------+------------------+--------+           PSV cm/sEDV cm/sStenosisPlaque DescriptionComments +----------+--------+--------+--------+------------------+--------+ CCA Prox  59      9               heterogenous               +----------+--------+--------+--------+------------------+--------+ CCA Distal57      14              heterogenous               +----------+--------+--------+--------+------------------+--------+ ICA Prox  71      21      1-39%   heterogenous               +----------+--------+--------+--------+------------------+--------+  ICA Distal44      14                                         +----------+--------+--------+--------+------------------+--------+ ECA       96                                                 +----------+--------+--------+--------+------------------+--------+ +----------+--------+-------+--------+-------------------+  PSV cm/sEDV cmsDescribeArm Pressure (mmHG) +----------+--------+-------+--------+-------------------+ [id number]                                        +----------+--------+-------+--------+-------------------+ +---------+--------+--------+--------------+ VertebralPSV cm/sEDV cm/sNot identified +---------+--------+--------+--------------+  Left Carotid Findings: +----------+--------+--------+--------+------------------+--------+           PSV cm/sEDV cm/sStenosisPlaque DescriptionComments +----------+--------+--------+--------+------------------+--------+ CCA Prox  85      7               heterogenous               +----------+--------+--------+--------+------------------+--------+ CCA Distal50      10              heterogenous               +----------+--------+--------+--------+------------------+--------+ ICA Prox  57      17      40-59%  heterogenous               +----------+--------+--------+--------+------------------+--------+ ICA Distal64      18                                         +----------+--------+--------+--------+------------------+--------+ ECA       69                                                 +----------+--------+--------+--------+------------------+--------+ +----------+--------+--------+--------+-------------------+           PSV cm/sEDV cm/sDescribeArm Pressure (mmHG) +----------+--------+--------+--------+-------------------+ [id number]                                         +----------+--------+--------+--------+-------------------+  +---------+--------+--------+--------------+ VertebralPSV cm/sEDV cm/sNot identified +---------+--------+--------+--------------+   Summary: Right Carotid: Velocities in the right ICA are consistent with a 1-39% stenosis. Left Carotid: Velocities in the left ICA are consistent with a 1-39% stenosis. Vertebrals: Bilateral vertebral arteries were not visualized. *See table(s) above for measurements and observations.  Electronically signed by Harold Barban MD on 03/29/2021 at 6:55:15 PM.    Final    Structural Heart Procedure  Result Date: 04/18/2021 Successful TAVR from right transfemoral approach with placement of a 29 mm Edwards Sapien 3 Ultra THV. See full operative note in the progress notes section.   CT ANGIO ABDOMEN PELVIS  W &/OR WO CONTRAST  Result Date: 03/30/2021 CLINICAL DATA:  65 year old male with history of severe aortic stenosis. Preprocedural study prior to potential transcatheter aortic valve replacement (TAVR) procedure. EXAM: CT ANGIOGRAPHY CHEST, ABDOMEN AND PELVIS TECHNIQUE: Multidetector CT imaging through the chest, abdomen and pelvis was performed using the standard protocol during bolus administration of intravenous contrast. Multiplanar reconstructed images and MIPs were obtained and reviewed to evaluate the vascular anatomy. CONTRAST:  163m OMNIPAQUE IOHEXOL 350 MG/ML SOLN COMPARISON:  No priors. FINDINGS: CTA CHEST FINDINGS Cardiovascular: Heart size is borderline enlarged. There is no significant pericardial fluid, thickening or pericardial calcification. There is aortic atherosclerosis, as well as atherosclerosis of the great vessels of the mediastinum and the coronary arteries, including calcified atherosclerotic plaque in the left anterior descending and right coronary arteries. Severe calcifications of the aortic valve.  Mild calcifications of the mitral annulus. Mediastinum/Lymph Nodes: No pathologically enlarged mediastinal or hilar lymph nodes. Esophagus is unremarkable  in appearance. No axillary lymphadenopathy. Lungs/Pleura: No suspicious appearing pulmonary nodules or masses are noted. No acute consolidative airspace disease. No pleural effusions. Diffuse bronchial wall thickening with moderate centrilobular and mild paraseptal emphysema. Musculoskeletal/Soft Tissues: There are no aggressive appearing lytic or blastic lesions noted in the visualized portions of the skeleton. CTA ABDOMEN AND PELVIS FINDINGS Hepatobiliary: Liver has a shrunken appearance and nodular contour, indicative of advanced cirrhosis. No discrete cystic or solid hepatic lesions are confidently identified. No intra or extrahepatic biliary ductal dilatation. Small calcified gallstones lying dependently in the gallbladder. No findings to suggest an acute cholecystitis are noted at this time. Pancreas: No pancreatic mass. No pancreatic ductal dilatation. No pancreatic or peripancreatic fluid collections or inflammatory changes. Spleen: Unremarkable. Adrenals/Urinary Tract: Bilateral kidneys and adrenal glands are normal in appearance. No hydroureteronephrosis. Urinary bladder is nearly completely decompressed, but otherwise unremarkable in appearance. Stomach/Bowel: Normal appearance of the stomach. No pathologic dilatation of small bowel or colon. Normal appendix. Vascular/Lymphatic: Aortic atherosclerosis, without evidence of aneurysm or dissection in the abdominal or pelvic vasculature. Vascular findings and measurements pertinent to potential TAVR procedure, as detailed below. No lymphadenopathy noted in the abdomen or pelvis. Reproductive: Prostate gland and seminal vesicles are unremarkable in appearance. Other: No significant volume of ascites.  No pneumoperitoneum. Musculoskeletal: There are no aggressive appearing lytic or blastic lesions noted in the visualized portions of the skeleton. VASCULAR MEASUREMENTS PERTINENT TO TAVR: Comment: Suboptimal arterial opacification secondary to suboptimal contrast  bolus severely limits the diagnostic quality of today's examination. Reported measurements below are considered best estimates given the nearly nondiagnostic quality of today's examination. AORTA: Minimal Aortic Diameter-20 x 15 mm Severity of Aortic Calcification-moderate RIGHT PELVIS: Right Common Iliac Artery - Minimal Diameter-15.3 x 15.4 mm Tortuosity-mild Calcification-moderate Right External Iliac Artery - Minimal Diameter-7.8 x 8.9 mm Tortuosity - mild Calcification-mild Right Common Femoral Artery - Minimal Diameter-8.5 x 7.5 mm Tortuosity - mild Calcification-mild LEFT PELVIS: Left Common Iliac Artery - Minimal Diameter-15.0 x 12.7 mm Tortuosity - mild Calcification-moderate Left External Iliac Artery - Minimal Diameter-10.6 x 9.3 mm Tortuosity - mild Calcification-mild Left Common Femoral Artery - Minimal Diameter-9.8 x 7.8 mm Tortuosity - mild Calcification-mild Review of the MIP images confirms the above findings. IMPRESSION: 1. Suboptimal examination (nearly nondiagnostic) for assessment of the pelvic arteries, with best estimate of vascular findings and measurements pertinent to potential TAVR procedure, as detailed above. 2. Severe calcifications of the aortic valve, compatible with reported clinical history of severe aortic stenosis. 3. Aortic atherosclerosis, in addition to 2 vessel coronary artery disease. Please note that although the presence of coronary artery calcium documents the presence of coronary artery disease, the severity of this disease and any potential stenosis cannot be assessed on this non-gated CT examination. Assessment for potential risk factor modification, dietary therapy or pharmacologic therapy may be warranted, if clinically indicated. 4. Diffuse bronchial wall thickening with moderate centrilobular and mild paraseptal emphysema; imaging findings suggestive of underlying COPD. 5. Cirrhosis. 6. Cholelithiasis without evidence of acute cholecystitis at this time. 7.  Additional incidental findings, as above. Electronically Signed   By: Vinnie Langton M.D.   On: 03/30/2021 10:21   Disposition   Pt is being discharged home today in good condition.  Follow-up Plans & Appointments     Follow-up Information     Eileen Stanford, PA-C Follow up on 04/27/2021.   Specialties:  Cardiology, Radiology Why: @ 1pm, please arrive at least 10 minutes early. Contact information: [address] [place] [address] [phone]                  Discharge Medications   Allergies as of 04/20/2021       Reactions   Codeine Shortness Of Breath, Swelling   Patient has tolerated hydrocodone and Dilaudid.   Morphine And Related Shortness Of Breath, Swelling   Patient has tolerated hydrocodone and Dilaudid.        Medication List     STOP taking these medications    naproxen sodium 220 MG tablet Commonly known as: ALEVE       TAKE these medications    ALPRAZolam 1 MG tablet Commonly known as: XANAX Take 1 tablet (1 mg total) by mouth at bedtime as needed for sleep.   aspirin 81 MG chewable tablet Chew 1 tablet (81 mg total) by mouth daily.   atorvastatin 10 MG tablet Commonly known as: LIPITOR TAKE 1 TABLET BY MOUTH EVERY DAY AT 6 PM   diltiazem 120 MG 24 hr capsule Commonly known as: CARDIZEM CD Take 1 capsule (120 mg total) by mouth daily.   furosemide 40 MG tablet Commonly known as: LASIX Take 1 tablet (40 mg total) by mouth daily.   HYDROcodone-acetaminophen 5-325 MG tablet Commonly known as: NORCO/VICODIN Take 1 tablet by mouth every 6 (six) hours as needed for moderate pain.   hydrocortisone 2.5 % cream Apply to external ears as needed for itching.   methocarbamol 500 MG tablet Commonly known as: ROBAXIN Take 1 tablet (500 mg total) by mouth 4 (four) times daily as needed for muscle spasms.   metoprolol succinate 50 MG 24 hr tablet Commonly known as: TOPROL-XL Take with or immediately following a  meal.   potassium chloride SA 20 MEQ tablet Commonly known as: KLOR-CON Take 1 tablet (20 mEq total) by mouth daily.   rivaroxaban 20 MG Tabs tablet Commonly known as: Xarelto TAKE 1 TABLET(20 MG) BY MOUTH DAILY WITH SUPPER   valsartan-hydrochlorothiazide 160-25 MG tablet Commonly known as: DIOVAN-HCT Take 1 tablet by mouth daily. What changed:  how much to take additional instructions       Outstanding Labs/Studies   none  Duration of Discharge Encounter   Greater than 30 minutes including physician time.  Signed, Angelena Form, PA-C 04/20/2021, 9:15 AM [phone]  I have personally seen and examined this patient. I agree with the assessment and plan as outlined above.  He is doing well. Pacemaker implanted yesterday and functioning well. Groins stable. Resume home BP meds.  Discharge home today.   Lauree Chandler 04/20/2021 10:13 AM

## 2021-04-21 ENCOUNTER — Telehealth: Payer: Self-pay | Admitting: Physician Assistant

## 2021-04-21 NOTE — Telephone Encounter (Signed)
  [place] VALVE TEAM   Patient contacted regarding discharge from [facility] on 9/1  Patient understands to follow up with provider Nell Range on 9/8 at [facility].  Patient understands discharge instructions? yes Patient understands medications and regimen? yes Patient understands to bring all medications to this visit? yes  Angelena Form PA-C  [facility]

## 2021-04-26 ENCOUNTER — Ambulatory Visit: Payer: 59 | Admitting: Physician Assistant

## 2021-04-27 ENCOUNTER — Ambulatory Visit (INDEPENDENT_AMBULATORY_CARE_PROVIDER_SITE_OTHER): Payer: 59

## 2021-04-27 ENCOUNTER — Other Ambulatory Visit: Payer: Self-pay | Admitting: Family Medicine

## 2021-04-27 ENCOUNTER — Encounter: Payer: Self-pay | Admitting: Physician Assistant

## 2021-04-27 ENCOUNTER — Other Ambulatory Visit: Payer: Self-pay

## 2021-04-27 ENCOUNTER — Ambulatory Visit: Payer: 59 | Admitting: Physician Assistant

## 2021-04-27 VITALS — BP 132/66 | HR 65 | Ht 75.0 in | Wt >= 6400 oz

## 2021-04-27 DIAGNOSIS — Z952 Presence of prosthetic heart valve: Secondary | ICD-10-CM | POA: Diagnosis not present

## 2021-04-27 DIAGNOSIS — I480 Paroxysmal atrial fibrillation: Secondary | ICD-10-CM

## 2021-04-27 DIAGNOSIS — I63412 Cerebral infarction due to embolism of left middle cerebral artery: Secondary | ICD-10-CM

## 2021-04-27 DIAGNOSIS — N50819 Testicular pain, unspecified: Secondary | ICD-10-CM

## 2021-04-27 DIAGNOSIS — I10 Essential (primary) hypertension: Secondary | ICD-10-CM

## 2021-04-27 DIAGNOSIS — I442 Atrioventricular block, complete: Secondary | ICD-10-CM

## 2021-04-27 DIAGNOSIS — I4819 Other persistent atrial fibrillation: Secondary | ICD-10-CM | POA: Diagnosis not present

## 2021-04-27 DIAGNOSIS — Z950 Presence of cardiac pacemaker: Secondary | ICD-10-CM | POA: Diagnosis not present

## 2021-04-27 LAB — CUP PACEART INCLINIC DEVICE CHECK
Battery Remaining Longevity: 145 mo
Battery Voltage: 3.14 V
Brady Statistic RA Percent Paced: 0 %
Brady Statistic RV Percent Paced: 99.94 %
Date Time Interrogation Session: 20220908145447
Implantable Lead Implant Date: 20220831
Implantable Lead Implant Date: 20220831
Implantable Lead Location: 753859
Implantable Lead Location: 753860
Implantable Lead Model: 5076
Implantable Lead Model: 5076
Implantable Pulse Generator Implant Date: 20220831
Lead Channel Impedance Value: 361 Ohm
Lead Channel Impedance Value: 380 Ohm
Lead Channel Impedance Value: 494 Ohm
Lead Channel Impedance Value: 551 Ohm
Lead Channel Pacing Threshold Amplitude: 0.75 V
Lead Channel Pacing Threshold Pulse Width: 0.4 ms
Lead Channel Sensing Intrinsic Amplitude: 2 mV
Lead Channel Setting Pacing Amplitude: 3.5 V
Lead Channel Setting Pacing Amplitude: 3.5 V
Lead Channel Setting Pacing Pulse Width: 0.4 ms
Lead Channel Setting Sensing Sensitivity: 0.9 mV

## 2021-04-27 MED ORDER — AMOXICILLIN 500 MG PO TABS: ORAL_TABLET | ORAL | 3 refills | Status: AC

## 2021-04-27 NOTE — Progress Notes (Signed)
HE[facility] AND [place]                                     Cardiology Office Note:    Date:  04/27/2021   ID:  Fredric Dine, DOB Sep 27, 1955, MRN [id number]  PCP:  Denita Lung, MD  [facility] Cardiologist:  [name], MD / Dr. Angelena Form & Dr. Cyndia Bent (TAVR) [facility] Electrophysiologist:  None   Referring MD: Denita Lung, MD   [facility] s/p TAVR  History of Present Illness:    David [name] is a 65 y.o. male with a hx of of HTN, HLD, prior CVA, super morbid obesity (BMI 54), persistent atrial fibrillation on Xarelto, RBBB, sleep apnea, former tobacco abuse and severe aortic stenosis s/p TAVR (04/18/21) c/b CHB s/p PPM (04/19/21) who presents to clinic for follow up.   He has been maintained on Xarelto for his persistent atrial fibrillation. Dr. Debara Pickett has been following his aortic stenosis with periodic echocardiograms.  His most recent echo on 03/02/2021 showed a trileaflet aortic valve with severe calcification and thickening and restricted leaflet mobility.  The mean gradient was 49 mmHg with a valve area by VTI of 0.67 cm consistent with severe aortic stenosis. Left ventricular ejection fraction was 60 to 65%. [facility] 03/23/21 showed no CAD and severe AS with a mean gradient of 41.3 mm hg and peak gradient of 62 mm hg. The patient had poor dentition and underwent dental extractions on 04/11/21.   He was evaluated by the multidisciplinary valve team and underwent successful TAVR with a 29 mm Edwards Sapien 3 Ultra THV via the TF approach on 04/18/21. He developed CHB after valve deployment and was pacer dependant. He is now s/p successful implantation of a Medtronic Azure XR DR MRI SureScan dual chamber pacemaker on 04/19/21 by Dr. Curt Bears. Post operative echo showed EF 60%, normally functioning TAVR with a mean gradient of 8 mmHg and trivial PVL. He was resumed home anticoagulation Xarelto with the addition of a baby aspirin x 6 months.  Today he  presents to clinic for follow up. He is feeling very short of breath and also has had some dizziness and fatigue. He is eating healthy now and trying to lose weight. Pacemaker site is tender and swollen. Legs are much less swollen than previously. He feels like his HR has never been so low and he would feel better if HR was higher. Also has a painful left testicle that is swollen. Started Monday.    Past Medical History:  Diagnosis Date   Allergy    RHINITIS   Arthritis    thumb   Complication of anesthesia    2012 shoulder surgery: patient unsure of what the problem was    GERD (gastroesophageal reflux disease)    "not really"   History of echocardiogram 03/29/2009   EF 50-55%; mild concentric LVH;    Hx of adenomatous colonic polyps    Hyperlipidemia    Hypertension    takes Diovan and Toprol   Obesity    PAF (paroxysmal atrial fibrillation) (HCC)    associated with OSA   Right bundle branch block    Rosacea    S/P placement of cardiac pacemaker 04/19/2021   Successful implantation of a Medtronic Azure XT DR MRI SureScan dual-chamber pacemaker for complete AV block   S/P TAVR (transcatheter aortic valve replacement)  04/18/2021   s/p TAVR with a 29 mm Edwards S3 via the TF approach by Dr. Angelena Form and Dr. Cyndia Bent   Shortness of breath    With activity   Sleep apnea    BiPap   Smoker    Stroke [facility]) 06/2016   "mini stroke"    Past Surgical History:  Procedure Laterality Date   COLONOSCOPY WITH PROPOFOL N/A 11/25/2020   Procedure: COLONOSCOPY WITH PROPOFOL;  Surgeon: Carol Ada, MD;  Location: [facility];  Service: Endoscopy;  Laterality: N/A;   FINGER SURGERY     Reconstruction of Left thumb and L index finger.  Reconstruction    HEMOSTASIS CLIP PLACEMENT  11/25/2020   Procedure: HEMOSTASIS CLIP PLACEMENT;  Surgeon: Carol Ada, MD;  Location: [facility];  Service: Endoscopy;;   KNEE ARTHROSCOPY     MULTIPLE EXTRACTIONS WITH ALVEOLOPLASTY N/A 04/11/2021   Procedure:  MULTIPLE EXTRACTION WITH ALVEOLOPLASTY;  Surgeon: Charlaine Dalton, DMD;  Location: [place];  Service: Dentistry;  Laterality: N/A;   PACEMAKER IMPLANT N/A 04/19/2021   Procedure: PACEMAKER IMPLANT;  Surgeon: Constance Haw, MD;  Location: [place] CV [facility];  Service: Cardiovascular;  Laterality: N/A;   POLYPECTOMY  11/25/2020   Procedure: POLYPECTOMY;  Surgeon: Carol Ada, MD;  [facility];  Service: Endoscopy;;   RIGHT/LEFT HEART CATH AND CORONARY ANGIOGRAPHY N/A 03/23/2021   Procedure: RIGHT/LEFT HEART CATH AND CORONARY ANGIOGRAPHY;  Surgeon: Burnell Blanks, MD;  Location: [place] [facility];  Service: Cardiovascular;  Laterality: N/A;   SHOULDER CLOSED REDUCTION     Procedure: CLOSED REDUCTION SHOULDER;  Surgeon: Lawrence Santiago [facility];  Location: [place];  Service: Orthopedics;  Laterality: Right;   SHOULDER OPEN ROTATOR CUFF REPAIR  08/11/2011   Procedure: ROTATOR CUFF REPAIR SHOULDER OPEN;  Surgeon: Willa Frater III;  Location: [place];  Service: Orthopedics;  Laterality: Right;   SUBMUCOSAL LIFTING INJECTION  11/25/2020   Procedure: SUBMUCOSAL LIFTING INJECTION;  Surgeon: Carol Ada, MD;  [facility];  Service: Endoscopy;;   SUBMUCOSAL TATTOO INJECTION  11/25/2020   Procedure: SUBMUCOSAL TATTOO INJECTION;  Surgeon: Carol Ada, MD;  [facility];  Service: Endoscopy;;   TEE WITHOUT CARDIOVERSION N/A 04/18/2021   Procedure: TRANSESOPHAGEAL ECHOCARDIOGRAM (TEE);  Surgeon: Burnell Blanks, MD;  Location: [place] [facility];  Service: Open Heart Surgery;  Laterality: N/A;   TONSILLECTOMY     as a child   TRANSCATHETER AORTIC VALVE REPLACEMENT, TRANSFEMORAL N/A 04/18/2021   Procedure: TRANSCATHETER AORTIC VALVE REPLACEMENT, TRANSFEMORAL;  Surgeon: Burnell Blanks, MD;  Location: [place] [facility];  Service: Open Heart Surgery;  Laterality: N/A;    Current Medications: Current Meds  Medication Sig   ALPRAZolam (XANAX) 1 MG tablet  Take 1 tablet (1 mg total) by mouth at bedtime as needed for sleep.   amoxicillin (AMOXIL) 500 MG tablet Take 4 tablets by mouth 60 minutes before any dental appointments   aspirin 81 MG chewable tablet Chew 1 tablet (81 mg total) by mouth daily.   atorvastatin (LIPITOR) 10 MG tablet TAKE 1 TABLET BY MOUTH EVERY DAY AT 6 PM   diltiazem (CARDIZEM CD) 120 MG 24 hr capsule Take 1 capsule (120 mg total) by mouth daily.   furosemide (LASIX) 40 MG tablet Take 1 tablet (40 mg total) by mouth daily.   HYDROcodone-acetaminophen (NORCO/VICODIN) 5-325 MG tablet Take 1 tablet by mouth every 6 (six) hours as needed for moderate pain.   hydrocortisone 2.5 % cream Apply to external ears as  needed for itching.   methocarbamol (ROBAXIN) 500 MG tablet Take 1 tablet (500 mg total) by mouth 4 (four) times daily as needed for muscle spasms.   metoprolol succinate (TOPROL-XL) 50 MG 24 hr tablet Take with or immediately following a meal.   potassium chloride SA (KLOR-CON) 20 MEQ tablet Take 1 tablet (20 mEq total) by mouth daily.   valsartan-hydrochlorothiazide (DIOVAN-HCT) 160-25 MG tablet Take 1 tablet by mouth daily.     Allergies:   Codeine and Morphine and related   Social History   Socioeconomic History   Marital status: Single    Spouse name: Not on file   Number of children: Not on file   Years of education: Not on file   Highest education level: Not on file  Occupational History   Not on file  Tobacco Use   Smoking status: Former    Packs/day: 1.00    Years: 40.00    Pack years: 40.00    Types: Cigarettes    Quit date: 06/20/2016    Years since quitting: 4.8   Smokeless tobacco: Never  Vaping Use   Vaping Use: Never used  Substance and Sexual Activity   Alcohol use: Yes    Alcohol/week: 10.0 standard drinks    Types: 2 Glasses of wine, 6 Cans of beer, 2 Shots of liquor per week   Drug use: No   Sexual activity: Not Currently  Other Topics Concern   Not on file  Social History Narrative    Not on file   Social Determinants of Health   Financial Resource Strain: Not on file  Food Insecurity: Not on file  Transportation Needs: Not on file  Physical Activity: Not on file  Stress: Not on file  Social Connections: Not on file     Family History: The patient's family history includes Cancer in his paternal uncle; Cancer - Colon in his father; Cancer - Prostate in his maternal grandfather and paternal grandfather; Heart attack (age of onset: 46) in his father; Heart disease in his mother. There is no history of Anesthesia problems or Stroke.  ROS:   Please see the history of present illness.    All other systems reviewed and are negative.  EKGs/Labs/Other Studies Reviewed:    The following studies were reviewed today:    TAVR OPERATIVE NOTE     Date of Procedure:                04/18/2021   Preoperative Diagnosis:      Severe Aortic Stenosis    Postoperative Diagnosis:    Same    Procedure:        Transcatheter Aortic Valve Replacement - Percutaneous Right Transfemoral Approach             Edwards Sapien 3  THV (size 29 mm, model # 9600TFX, serial # [id number])              Co-Surgeons:                        Gaye Pollack, MD and Lauree Chandler, MD     Anesthesiologist:                  Tamela Gammon, MD   Echocardiographer:              Sanda Klein, MD   Pre-operative Echo Findings: Severe aortic stenosis Normal left ventricular systolic function   Post-operative Echo Findings: trivial paravalvular leak Normal  left ventricular systolic function   _____________     Echo 04/19/21:  IMPRESSIONS   1. Left ventricular ejection fraction, by estimation, is 60 to 65%. The  left ventricle has normal function. The left ventricle has no regional  wall motion abnormalities. There is mild concentric left ventricular  hypertrophy. Left ventricular diastolic  function could not be evaluated.   2. Right ventricular systolic function is normal. The right  ventricular  size is normal. There is mildly elevated pulmonary artery systolic  pressure.   3. Left atrial size was severely dilated.   4. The mitral valve is normal in structure. No evidence of mitral valve  regurgitation.   5. The aortic valve has been repaired/replaced. Aortic valve  regurgitation is trivial. There is a 29 mm Edwards Ultra, stented (TAVR)  valve present in the aortic position. Procedure Date: 04/18/21. Echo  findings are consistent with normal structure and  function of the aortic valve prosthesis. Echo findings are consistent with  trace perivalvular leak of the aortic prosthesis. Aortic valve mean  gradient measures 8.0 mmHg. Aortic valve Vmax measures 1.98 m/s. Aortic  valve acceleration time measures 92  msec.   6. The inferior vena cava is dilated in size with <50% respiratory  variability, suggesting right atrial pressure of 15 mmHg.   Comparison(s): Prior images reviewed side by side. The perivalvular leak  is barely visible on the current transthoracic study.    ___________________   04/19/21 PACEMAKER IMPLANT    Conclusion   SURGEON:  Will Meredith Leeds, MD     PREPROCEDURE DIAGNOSIS:  complete AV block    POSTPROCEDURE DIAGNOSIS:  complete AV block     PROCEDURES:   1. Pacemaker implantation.     INTRODUCTION: David [name] is a 65 y.o. male  with a history of aortic stenosis post TAVR with heart block post procedure.  No reversible causes have been identified.  The patient therefore presents today for pacemaker implantation.   CONCLUSIONS:   1. Successful implantation of a Medtronic Azure XT DR MRI SureScan dual-chamber pacemaker for complete AV block  2. No early apparent complications.       EKG:  EKG is NOT ordered today.   Recent Labs: 05/05/2020: B Natriuretic Peptide 157.0 05/20/2020: TSH 1.982 04/14/2021: ALT 27 04/19/2021: Magnesium 2.1 04/20/2021: BUN 18; Creatinine, Ser 1.03; Hemoglobin 14.6; Platelets 176; Potassium 4.0; Sodium 133   Recent Lipid Panel    Component Value Date/Time   CHOL 127 09/15/2020 1629   TRIG 93 09/15/2020 1629   HDL 29 (L) 09/15/2020 1629   CHOLHDL 4.4 09/15/2020 1629   CHOLHDL 6.8 06/22/2016 0318   VLDL 22 06/22/2016 0318   LDLCALC 80 09/15/2020 1629     Risk Assessment/Calculations:    CHA2DS2-VASc Score = 3   This indicates a 3.2% annual risk of stroke. The patient's score is based upon: CHF History: 0 HTN History: 1 Diabetes History: 0 Stroke History: 2 Vascular Disease History: 0 Age Score: 0 Gender Score: 0      Physical Exam:    VS:  BP 132/66   Pulse 65   Ht '6\' 3"'$  (1.905 m)   Wt (!) 427 lb (193.7 kg)   SpO2 97%   BMI 53.37 kg/m     Wt Readings from Last 3 Encounters:  04/27/21 (!) 427 lb (193.7 kg)  04/20/21 (!) 429 lb 14.4 oz (195 kg)  04/14/21 (!) 476 lb 9.6 oz (216.2 kg)     GEN:  Well nourished,  well developed in no acute distress, morbidly obese HEENT: Normal NECK: No JVD; No carotid bruits LYMPHATICS: No lymphadenopathy CARDIAC: RRR, distant heart sounds. No murmurs, rubs, gallops RESPIRATORY:  Clear to auscultation without rales, wheezing or rhonchi  ABDOMEN: Soft, non-tender, non-distended MUSCULOSKELETAL:  No edema; No deformity  SKIN: Warm and dry.  Groin sites clear without hematoma or ecchymosis  NEUROLOGIC:  Alert and oriented x 3 PSYCHIATRIC:  Normal affect   ASSESSMENT:    1. S/P TAVR (transcatheter aortic valve replacement)   2. S/P placement of cardiac pacemaker   3. Primary hypertension   4. Persistent atrial fibrillation ([place])   5. Morbid obesity ([place])   6. Testicle tenderness    PLAN:    In order of problems listed above:  Severe AS s/p TAVR: doing okay 1 week out from TAVR. Groin sites healing well. Has been on Xarelto and aspirin. SBE prophylaxis discussed; I have RX'd amoxicillin. Has had worsening dyspnea that I think is related to pacer rate as it improved with adjustment, but will check some basic labs.   CHB s/p PPM  with pocket hematoma: seen by device RN and pressure dressing applied. Also having worsening of dyspnea which he attributed to his HR being in 22s. Discussed with Dr Curt Bears and device reprogrammed to pace at 70 with rate response. Pt already can tell a difference in breathing. Holding Xarelto until wound check next week.   HTN: BP well controlled. No changes made   Persistent atrial fibrillation: now pacing with CHB underneath. Will have to hold Xarelto with pocket hematoma at pacer site. Wound care apt 9/14   Morbid obesity: Body mass index is 53.73 kg/m. Working hard on changing his diet  Painful testicle: started on Monday. I have arranged for an apt with Dr. Claudia Desanctis with [facility] tomorrow at 2:45pm for evaluation.    Total time spent with patient was over 40 minutes which included evaluating patient, reviewing record and coordinating care. Face to face time >50%. Discussed case with device nurses, Dr. Curt Bears and urology. He required device reprogramming and dressing changes.    Click here to see a list of contraindications to cardiac rehab    :1}  Cardiac Rehabilitation Eligibility Assessment: The patient is ready to start Cardiac Rehab from a cardiac standpoint.      Medication Adjustments/Labs and Tests Ordered: Current medicines are reviewed at length with the patient today.  Concerns regarding medicines are outlined above.  Orders Placed This Encounter  Procedures   Basic metabolic panel   Pro b natriuretic peptide (BNP)   CBC    Meds ordered this encounter  Medications   amoxicillin (AMOXIL) 500 MG tablet    Sig: Take 4 tablets by mouth 60 minutes before any dental appointments    Dispense:  12 tablet    Refill:  3     Patient Instructions  Medication Instructions:  HOLD [place]  *If you need a refill on your cardiac medications before your next appointment, please call your pharmacy*   Lab Work: TODAY:  CBC, BMET, PRO BNP  If you have labs (blood work) drawn today and your tests are completely normal, you will receive your results only by: [place] (if you have MyChart) OR A paper copy in the mail If you have any lab test that is abnormal or we need to change your treatment, we will call you to review the results.   Testing/Procedures: NONE   Follow-Up:  As planned  2:30 PM 04/28/21 Dr. Claudia Desanctis, [facility]      Signed, Angelena Form, PA-C  04/27/2021 2:17 PM    [facility]

## 2021-04-27 NOTE — Progress Notes (Signed)
Wound check per request of Angelena Form, PA during patient's scheduled TAVR follow up. Steri-strips intact. Hematoma noted at implant site. Moderate amt of bruising noted to left lateral chest and under left arm. Patient noted to be on xarelto and low dose ASA. Dr. Curt Bears made aware. Xarelto will be held x 1 week. See note in epic. Pressure dressing applied. Patient also complaining of increased shortness of breath and lack of energy post implant. Programmed DDDR'@60'$ . Increased base rate to 70 per MD order. Otherwise normal device function. Thresholds, sensing, and impedances consistent with implant measurements. Device programmed at 3.5V/auto capture programmed on for extra safety margin until 3 month visit. No mode switches or high ventricular rates noted. Patient educated about wound care, arm mobility, lifting restrictions. Return next week for scheduled wound check appointment.

## 2021-04-27 NOTE — Patient Instructions (Addendum)
Medication Instructions:  HOLD XARELTO UNTIL AFTER YOUR WOUND CHECK APPOINTMENT NEXT WEEK  *If you need a refill on your cardiac medications before your next appointment, please call your pharmacy*   Lab Work: TODAY: CBC, BMET, PRO BNP  If you have labs (blood work) drawn today and your tests are completely normal, you will receive your results only by: [place] (if you have MyChart) OR A paper copy in the mail If you have any lab test that is abnormal or we need to change your treatment, we will call you to review the results.   Testing/Procedures: NONE   Follow-Up: As planned  2:30 PM 04/28/21 Dr. Claudia Desanctis, [facility]

## 2021-04-28 ENCOUNTER — Other Ambulatory Visit: Payer: Self-pay | Admitting: Physician Assistant

## 2021-04-28 DIAGNOSIS — I5033 Acute on chronic diastolic (congestive) heart failure: Secondary | ICD-10-CM

## 2021-04-28 LAB — BASIC METABOLIC PANEL
BUN/Creatinine Ratio: 19 (ref 10–24)
BUN: 25 mg/dL (ref 8–27)
CO2: 23 mmol/L (ref 20–29)
Calcium: 9.4 mg/dL (ref 8.6–10.2)
Chloride: 98 mmol/L (ref 96–106)
Creatinine, Ser: 1.3 mg/dL — ABNORMAL HIGH (ref 0.76–1.27)
Glucose: 93 mg/dL (ref 65–99)
Potassium: 4.7 mmol/L (ref 3.5–5.2)
Sodium: 138 mmol/L (ref 134–144)
eGFR: 61 mL/min/{1.73_m2} (ref >59)

## 2021-04-28 LAB — CBC
Hematocrit: 42.1 % (ref 37.5–51.0)
Hemoglobin: 13.6 g/dL (ref 13.0–17.7)
MCH: 29.8 pg (ref 26.6–33.0)
MCHC: 32.3 g/dL (ref 31.5–35.7)
MCV: 92 fL (ref 79–97)
Platelets: 240 10*3/uL (ref 150–450)
RBC: 4.56 x10E6/uL (ref 4.14–5.80)
RDW: 14.1 % (ref 11.6–15.4)
WBC: 12.4 10*3/uL — ABNORMAL HIGH (ref 3.4–10.8)

## 2021-04-28 LAB — PRO B NATRIURETIC PEPTIDE: NT-Pro BNP: 1279 pg/mL — ABNORMAL HIGH (ref 0–210)

## 2021-05-03 ENCOUNTER — Ambulatory Visit (INDEPENDENT_AMBULATORY_CARE_PROVIDER_SITE_OTHER): Payer: 59

## 2021-05-03 ENCOUNTER — Other Ambulatory Visit: Payer: Self-pay

## 2021-05-03 ENCOUNTER — Encounter: Payer: 59 | Admitting: Surgery

## 2021-05-03 ENCOUNTER — Other Ambulatory Visit: Payer: 59 | Admitting: *Deleted

## 2021-05-03 DIAGNOSIS — I5033 Acute on chronic diastolic (congestive) heart failure: Secondary | ICD-10-CM

## 2021-05-03 DIAGNOSIS — I442 Atrioventricular block, complete: Secondary | ICD-10-CM | POA: Diagnosis not present

## 2021-05-03 NOTE — Patient Instructions (Signed)

## 2021-05-04 ENCOUNTER — Telehealth: Payer: Self-pay

## 2021-05-04 LAB — CUP PACEART INCLINIC DEVICE CHECK
Battery Remaining Longevity: 141 mo
Battery Voltage: 3.15 V
Brady Statistic RA Percent Paced: 0 %
Brady Statistic RV Percent Paced: 93.26 %
Date Time Interrogation Session: 20220914154100
Implantable Lead Implant Date: 20220831
Implantable Lead Implant Date: 20220831
Implantable Lead Location: 753859
Implantable Lead Location: 753860
Implantable Lead Model: 5076
Implantable Lead Model: 5076
Implantable Pulse Generator Implant Date: 20220831
Lead Channel Impedance Value: 361 Ohm
Lead Channel Impedance Value: 380 Ohm
Lead Channel Impedance Value: 456 Ohm
Lead Channel Impedance Value: 551 Ohm
Lead Channel Pacing Threshold Amplitude: 0.625 V
Lead Channel Pacing Threshold Pulse Width: 0.4 ms
Lead Channel Sensing Intrinsic Amplitude: 4.25 mV
Lead Channel Sensing Intrinsic Amplitude: 6.5 mV
Lead Channel Setting Pacing Amplitude: 3.5 V
Lead Channel Setting Pacing Amplitude: 3.5 V
Lead Channel Setting Pacing Pulse Width: 0.4 ms
Lead Channel Setting Sensing Sensitivity: 0.9 mV

## 2021-05-04 LAB — BASIC METABOLIC PANEL
BUN/Creatinine Ratio: 17 (ref 10–24)
BUN: 22 mg/dL (ref 8–27)
CO2: 21 mmol/L (ref 20–29)
Calcium: 9.5 mg/dL (ref 8.6–10.2)
Chloride: 97 mmol/L (ref 96–106)
Creatinine, Ser: 1.31 mg/dL — ABNORMAL HIGH (ref 0.76–1.27)
Glucose: 81 mg/dL (ref 65–99)
Potassium: 4.5 mmol/L (ref 3.5–5.2)
Sodium: 137 mmol/L (ref 134–144)
eGFR: 61 mL/min/{1.73_m2} (ref >59)

## 2021-05-04 NOTE — Telephone Encounter (Signed)
The patient requests a call back from Nell Range to discuss whether or not to continue Xarelto as he is still holding the medication.

## 2021-05-04 NOTE — Progress Notes (Addendum)
Wound check appointment. Steri-strips removed. Wound without redness. Incision edges approximated, wound well healed. Hematoma smaller in size as compared to 04/27/21 assessment. Ecchymosis resolving.  Discussed with patient may take time to resolve. Continues to hold xarelto. AF burden 100%. Normal device function. Thresholds, sensing, and impedances consistent with implant measurements. Device programmed at 3.5V/auto capture programmed on for extra safety margin until 3 month visit. Histogram distribution appropriate for patient and level of activity. No mode switches or high ventricular rates noted. Patient educated about wound care, arm mobility, lifting restrictions.  Will follow up with Dr. Curt Bears on resuming xarelto. patient enrolled in remote monitoring with next transmission scheduled 07/19/21. 91 day follow up with Dr. Curt Bears 07/25/21

## 2021-05-04 NOTE — Telephone Encounter (Signed)
Portia spoke to Dr. Curt Bears and he wanted him to hold Xarelto for a total of 10 days. She will call him and discuss when to restart Xarelto given pocket hematoma

## 2021-05-04 NOTE — Progress Notes (Signed)
Successful telephone encounter to patient to advise on resuming xarelto currently held for hematoma at device implant site. Patient informed, per Dr. Curt Bears recommendation that he should continue to hold for a total of 10 days. Patient's last dose 04/27/21 and will resume 05/08/21. Patient verbalized understanding and appreciative of call.

## 2021-05-05 ENCOUNTER — Other Ambulatory Visit: Payer: Self-pay

## 2021-05-05 ENCOUNTER — Ambulatory Visit (INDEPENDENT_AMBULATORY_CARE_PROVIDER_SITE_OTHER): Payer: 59 | Admitting: Family Medicine

## 2021-05-05 ENCOUNTER — Encounter: Payer: Self-pay | Admitting: Family Medicine

## 2021-05-05 VITALS — BP 120/64 | HR 80 | Temp 98.6°F | Wt >= 6400 oz

## 2021-05-05 DIAGNOSIS — M109 Gout, unspecified: Secondary | ICD-10-CM

## 2021-05-05 DIAGNOSIS — M62838 Other muscle spasm: Secondary | ICD-10-CM | POA: Diagnosis not present

## 2021-05-05 DIAGNOSIS — Z23 Encounter for immunization: Secondary | ICD-10-CM | POA: Diagnosis not present

## 2021-05-05 MED ORDER — METHOCARBAMOL 500 MG PO TABS: 500.0000 mg | ORAL_TABLET | Freq: Four times a day (QID) | ORAL | 0 refills | Status: DC | PRN

## 2021-05-05 MED ORDER — COLCHICINE 0.6 MG PO TABS: 0.6000 mg | ORAL_TABLET | Freq: Two times a day (BID) | ORAL | 1 refills | Status: DC

## 2021-05-05 NOTE — Addendum Note (Signed)
Addended by: Elyse Jarvis on: 05/05/2021 02:05 PM   Modules accepted: Orders

## 2021-05-05 NOTE — Progress Notes (Signed)
   Subjective:    Patient ID: David [name], male    DOB: [birth year], 65 y.o.   MRN: [id number]  HPI He states that last Saturday he started having difficulty with bilateral foot pain, right greater than left mainly in the great toe area.  He has never had difficulty with this in the past.  He states that he cannot stand even having bed sheets touch his toes. He would also like a refill on his Robaxin.  He uses this for upper back and neck pain.  He uses this very sparingly sometimes less than once or twice per week.  Review of Systems     Objective:   Physical Exam Exam of both feet does show tenderness to palpation and slight warmth on the right and pain with motion however pain in the left MTP is much less severe.       Assessment & Plan:  Gouty arthritis of right great toe - Plan: colchicine 0.6 MG tablet  Muscle spasms of neck - Plan: methocarbamol (ROBAXIN) 500 MG tablet I explained that since he will eventually be put back on Xarelto, we cannot use an NSAID.  Recommend Tylenol for pain relief.  He can also use some codeine as he has at home if he needs to.  He will keep me informed concerning this.  Explained that we would not necessarily be put him on preventive medication at the present time.  If he continues have difficulty I will work towards getting him on allopurinol.

## 2021-05-16 NOTE — Progress Notes (Addendum)
HEART AND David [name]                                     Cardiology Office Note:    Date:  05/17/2021   ID:  David [name], DOB 12-Jul-1956, MRN [id number]  PCP:  Denita Lung, MD  [facility] Cardiologist:  [name], MD / Dr. Angelena Form & Dr. Cyndia Bent (TAVR) [facility] Electrophysiologist:  None   Referring MD: Denita Lung, MD   1 month s/p TAVR  History of Present Illness:    David [name] is a 65 y.o. male with a hx of of HTN, HLD, prior CVA, super morbid obesity (BMI 54), persistent atrial fibrillation on Xarelto, RBBB, sleep apnea, former tobacco abuse and severe aortic stenosis s/p TAVR (04/18/21) c/b CHB s/p PPM (04/19/21) who presents to clinic for follow up.   He has been maintained on Xarelto for his persistent atrial fibrillation. Dr. Debara Pickett has been following his aortic stenosis with periodic echocardiograms.  His most recent echo on 03/02/2021 showed a trileaflet aortic valve with severe calcification and thickening and restricted leaflet mobility.  The mean gradient was 49 mmHg with a valve area by VTI of 0.67 cm consistent with severe aortic stenosis. Left ventricular ejection fraction was 60 to 65%. [facility] 03/23/21 showed no CAD and severe AS with a mean gradient of 41.3 mm hg and peak gradient of 62 mm hg. The patient had poor dentition and underwent dental extractions on 04/11/21.   He was evaluated by the multidisciplinary valve team and underwent successful TAVR with a 29 mm Edwards Sapien 3 Ultra THV via the TF approach on 04/18/21. He developed CHB after valve deployment and was pacer dependant. He is now s/p successful implantation of a Medtronic Azure XR DR MRI SureScan dual chamber pacemaker on 04/19/21 by Dr. Curt Bears. Post operative echo showed EF 60%, normally functioning TAVR with a mean gradient of 8 mmHg and trivial PVL. He was resumed home anticoagulation Xarelto with the addition of a baby aspirin x 6 months.  He  had a difficult post op recovery with a pocket hematoma for which he held Xarelto, acute on chronic CHF, possible epididymitis and gout. His pacer was reset to pace at a higher rate and this helped a bit.    Today he presents to clinic for follow up. Has been taking lasix 80mg  daily mistakenly ( I told him to take 80 for a few days). He thinks it has really helped his swelling but not his shortness of breath. He still has NYHA class III symptoms. He actually thinks his shortness of breath is worse then before his valve surgery. NO chest pain. No orthopnea. No dizziness or syncope. No blood in stool or urine. Testicle still bothers him   Past Medical History:  Diagnosis Date   Allergy    RHINITIS   Arthritis    thumb   Complication of anesthesia    2012 shoulder surgery: patient unsure of what the problem was    GERD (gastroesophageal reflux disease)    "not really"   History of echocardiogram 03/29/2009   EF 50-55%; mild concentric LVH;    Hx of adenomatous colonic polyps    Hyperlipidemia    Hypertension    takes Diovan and Toprol   Obesity    PAF (paroxysmal atrial fibrillation) ([place])    associated with  OSA   Right bundle branch block    Rosacea    S/P placement of cardiac pacemaker 04/19/2021   Successful implantation of a Medtronic Azure XT DR MRI SureScan dual-chamber pacemaker for complete AV block   S/P TAVR (transcatheter aortic valve replacement) 04/18/2021   s/p TAVR with a 29 mm Edwards S3 via the TF approach by Dr. Angelena Form and Dr. Cyndia Bent   Shortness of breath    With activity   Sleep apnea    BiPap   Smoker    Stroke ([place]) 06/2016   "mini stroke"    Past Surgical History:  Procedure Laterality Date   COLONOSCOPY WITH PROPOFOL N/A 11/25/2020   Procedure: COLONOSCOPY WITH PROPOFOL;  Surgeon: Carol Ada, MD;  Location: [facility];  Service: Endoscopy;  Laterality: N/A;   FINGER SURGERY     Reconstruction of Left thumb and L index finger.  Reconstruction     HEMOSTASIS CLIP PLACEMENT  11/25/2020   Procedure: HEMOSTASIS CLIP PLACEMENT;  Surgeon: Carol Ada, MD;  Location: [facility];  Service: Endoscopy;;   KNEE ARTHROSCOPY     MULTIPLE EXTRACTIONS WITH ALVEOLOPLASTY N/A 04/11/2021   Procedure: MULTIPLE EXTRACTION WITH ALVEOLOPLASTY;  Surgeon: Charlaine Dalton, DMD;  Location: [place];  Service: Dentistry;  Laterality: N/A;   PACEMAKER IMPLANT N/A 04/19/2021   Procedure: PACEMAKER IMPLANT;  Surgeon: Constance Haw, MD;  Location: [place] [facility];  Service: Cardiovascular;  Laterality: N/A;   POLYPECTOMY  11/25/2020   Procedure: POLYPECTOMY;  Surgeon: Carol Ada, MD;  [facility];  Service: Endoscopy;;   RIGHT/LEFT HEART CATH AND CORONARY ANGIOGRAPHY N/A 03/23/2021   Procedure: RIGHT/LEFT HEART CATH AND CORONARY ANGIOGRAPHY;  Surgeon: Burnell Blanks, MD;  Location: [place] [facility];  Service: Cardiovascular;  Laterality: N/A;   SHOULDER CLOSED REDUCTION     Procedure: CLOSED REDUCTION SHOULDER;  Surgeon: Lawrence [facility];  Location: [place];  Service: Orthopedics;  Laterality: Right;   SHOULDER OPEN ROTATOR CUFF REPAIR  08/11/2011   Procedure: ROTATOR CUFF REPAIR SHOULDER OPEN;  Surgeon: Willa Frater III;  Location: [place];  Service: Orthopedics;  Laterality: Right;   SUBMUCOSAL LIFTING INJECTION  11/25/2020   Procedure: SUBMUCOSAL LIFTING INJECTION;  Surgeon: Carol Ada, MD;  [facility];  Service: Endoscopy;;   SUBMUCOSAL TATTOO INJECTION  11/25/2020   Procedure: SUBMUCOSAL TATTOO INJECTION;  Surgeon: Carol Ada, MD;  [facility];  Service: Endoscopy;;   TEE WITHOUT CARDIOVERSION N/A 04/18/2021   Procedure: TRANSESOPHAGEAL ECHOCARDIOGRAM (TEE);  Surgeon: Burnell Blanks, MD;  Location: [place] [facility];  Service: Open Heart Surgery;  Laterality: N/A;   TONSILLECTOMY     as a child   TRANSCATHETER AORTIC VALVE REPLACEMENT, TRANSFEMORAL N/A 04/18/2021   Procedure: TRANSCATHETER  AORTIC VALVE REPLACEMENT, TRANSFEMORAL;  Surgeon: Burnell Blanks, MD;  Location: [place] [facility];  Service: Open Heart Surgery;  Laterality: N/A;    Current Medications: Current Meds  Medication Sig   ALPRAZolam (XANAX) 1 MG tablet Take 1 tablet (1 mg total) by mouth at bedtime as needed for sleep.   amoxicillin (AMOXIL) 500 MG tablet Take 4 tablets by mouth 60 minutes before any dental appointments   atorvastatin (LIPITOR) 10 MG tablet TAKE 1 TABLET BY MOUTH EVERY DAY AT 6 PM   cephALEXin (KEFLEX) 500 MG capsule Take 500 mg by mouth 4 (four) times daily.   colchicine 0.6 MG tablet Take 1 tablet (0.6 mg total) by mouth 2 (two) times daily.   diltiazem (  CARDIZEM CD) 120 MG 24 hr capsule TAKE 1 CAPSULE(120 MG) BY MOUTH DAILY   furosemide (LASIX) 40 MG tablet Take 1 tablet (40 mg total) by mouth daily. (Patient taking differently: Take 80 mg by mouth daily.)   HYDROcodone-acetaminophen (NORCO/VICODIN) 5-325 MG tablet Take 1 tablet by mouth every 6 (six) hours as needed for moderate pain.   hydrocortisone 2.5 % cream Apply to external ears as needed for itching.   methocarbamol (ROBAXIN) 500 MG tablet Take 1 tablet (500 mg total) by mouth 4 (four) times daily as needed for muscle spasms.   metoprolol succinate (TOPROL-XL) 50 MG 24 hr tablet TAKE 1 TABLET BY MOUTH WITH OR IMMEDIATELY FOLLOWING A MEAL AS DIRECTED   potassium chloride SA (KLOR-CON) 20 MEQ tablet Take 1 tablet (20 mEq total) by mouth daily.   valsartan-hydrochlorothiazide (DIOVAN-HCT) 160-25 MG tablet Take 1 tablet by mouth daily.   XARELTO 20 MG TABS tablet TAKE 1 TABLET(20 MG) BY MOUTH DAILY WITH SUPPER   [DISCONTINUED] aspirin 81 MG chewable tablet Chew 1 tablet (81 mg total) by mouth daily.     Allergies:   Codeine and Morphine and related   Social History   Socioeconomic History   Marital status: Single    Spouse name: Not on file   Number of children: Not on file   Years of education: Not on file   Highest  education level: Not on file  Occupational History   Not on file  Tobacco Use   Smoking status: Former    Packs/day: 1.00    Years: 40.00    Pack years: 40.00    Types: Cigarettes    Quit date: 06/20/2016    Years since quitting: 4.9   Smokeless tobacco: Never  Vaping Use   Vaping Use: Never used  Substance and Sexual Activity   Alcohol use: Yes    Alcohol/week: 10.0 standard drinks    Types: 2 Glasses of wine, 6 Cans of beer, 2 Shots of liquor per week   Drug use: No   Sexual activity: Not Currently  Other Topics Concern   Not on file  Social History Narrative   Not on file   Social Determinants of Health   Financial Resource Strain: Not on file  Food Insecurity: Not on file  Transportation Needs: Not on file  Physical Activity: Not on file  Stress: Not on file  Social Connections: Not on file     Family History: The patient's family history includes Cancer in his paternal uncle; Cancer - Colon in his father; Cancer - Prostate in his maternal grandfather and paternal grandfather; Heart attack (age of onset: 30) in his father; Heart disease in his mother. There is no history of Anesthesia problems or Stroke.  ROS:   Please see the history of present illness.    All other systems reviewed and are negative.  EKGs/Labs/Other Studies Reviewed:    The following studies were reviewed today:    TAVR OPERATIVE NOTE     Date of Procedure:                04/18/2021   Preoperative Diagnosis:      Severe Aortic Stenosis    Postoperative Diagnosis:    Same    Procedure:        Transcatheter Aortic Valve Replacement - Percutaneous Right Transfemoral Approach             Edwards Sapien 3  THV (size 29 mm, model # 9600TFX, serial # [id number])  Co-Surgeons:                        Gaye Pollack, MD and Lauree Chandler, MD     Anesthesiologist:                  Tamela Gammon, MD   Echocardiographer:              Sanda Klein, MD   Pre-operative Echo  Findings: Severe aortic stenosis Normal left ventricular systolic function   Post-operative Echo Findings: trivial paravalvular leak Normal left ventricular systolic function   _____________     Echo 04/19/21:  IMPRESSIONS   1. Left ventricular ejection fraction, by estimation, is 60 to 65%. The  left ventricle has normal function. The left ventricle has no regional  wall motion abnormalities. There is mild concentric left ventricular  hypertrophy. Left ventricular diastolic  function could not be evaluated.   2. Right ventricular systolic function is normal. The right ventricular  size is normal. There is mildly elevated pulmonary artery systolic  pressure.   3. Left atrial size was severely dilated.   4. The mitral valve is normal in structure. No evidence of mitral valve  regurgitation.   5. The aortic valve has been repaired/replaced. Aortic valve  regurgitation is trivial. There is a 29 mm Edwards Ultra, stented (TAVR)  valve present in the aortic position. Procedure Date: 04/18/21. Echo  findings are consistent with normal structure and  function of the aortic valve prosthesis. Echo findings are consistent with  trace perivalvular leak of the aortic prosthesis. Aortic valve mean  gradient measures 8.0 mmHg. Aortic valve Vmax measures 1.98 m/s. Aortic  valve acceleration time measures 92  msec.   6. The inferior vena cava is dilated in size with <50% respiratory  variability, suggesting right atrial pressure of 15 mmHg.   Comparison(s): Prior images reviewed side by side. The perivalvular leak  is barely visible on the current transthoracic study.    ___________________   04/19/21 PACEMAKER IMPLANT    Conclusion   SURGEON:  Will Meredith Leeds, MD     PREPROCEDURE DIAGNOSIS:  complete AV block    POSTPROCEDURE DIAGNOSIS:  complete AV block     PROCEDURES:   1. Pacemaker implantation.     INTRODUCTION: ORLANDIS SANDEN is a 65 y.o. male  with a history of aortic  stenosis post TAVR with heart block post procedure.  No reversible causes have been identified.  The patient therefore presents today for pacemaker implantation.   CONCLUSIONS:   1. Successful implantation of a Medtronic Azure XT DR MRI SureScan dual-chamber pacemaker for complete AV block  2. No early apparent complications.     ___________________________  Echo 05/17/21 IMPRESSIONS  1. Compared to 04/19/21 no significant change; LV functon is normal; s/p  AVR with mean gradient 9 mmHg; previously described trivial AI not evident  on present study.   2. Left ventricular ejection fraction, by estimation, is 60 to 65%. The  left ventricle has normal function. The left ventricle has no regional  wall motion abnormalities. There is mild left ventricular hypertrophy.  Left ventricular diastolic parameters  are indeterminate.   3. Right ventricular systolic function is normal. The right ventricular  size is normal. There is normal pulmonary artery systolic pressure.   4. The mitral valve is normal in structure. Trivial mitral valve  regurgitation. No evidence of mitral stenosis.   5. The aortic valve has been repaired/replaced. Aortic  valve  regurgitation is not visualized. No aortic stenosis is present. There is a  29 mm Ultra, stented (TAVR) valve present in the aortic position.  Procedure Date: 04/18/21.   6. The inferior vena cava is normal in size with greater than 50%  respiratory variability, suggesting right atrial pressure of 3 mmHg.   Comparison(s): 04/19/21 EF 60-65%. AV 5mmHg mean PG, 50mmHg peak PG.   EKG:  EKG is NOT ordered today.   Recent Labs: 05/20/2020: TSH 1.982 04/14/2021: ALT 27 04/19/2021: Magnesium 2.1 04/27/2021: Hemoglobin 13.6; NT-Pro BNP 1,279; Platelets 240 05/03/2021: BUN 22; Creatinine, Ser 1.31; Potassium 4.5; Sodium 137  Recent Lipid Panel    Component Value Date/Time   CHOL 127 09/15/2020 1629   TRIG 93 09/15/2020 1629   HDL 29 (L) 09/15/2020 1629    CHOLHDL 4.4 09/15/2020 1629   CHOLHDL 6.8 06/22/2016 0318   VLDL 22 06/22/2016 0318   LDLCALC 80 09/15/2020 1629     Risk Assessment/Calculations:    CHA2DS2-VASc Score = 3   This indicates a 3.2% annual risk of stroke. The patient's score is based upon: CHF History: 0 HTN History: 1 Diabetes History: 0 Stroke History: 2 Vascular Disease History: 0 Age Score: 0 Gender Score: 0      Physical Exam:    VS:  BP 126/68   Pulse 76   Ht 6\' 3"  (1.905 m)   Wt (!) 419 lb (190.1 kg)   SpO2 94%   BMI 52.37 kg/m     Wt Readings from Last 3 Encounters:  05/17/21 (!) 419 lb (190.1 kg)  05/05/21 (!) 421 lb (191 kg)  04/27/21 (!) 427 lb (193.7 kg)     GEN:  Well nourished, well developed in no acute distress, morbidly obese HEENT: Normal NECK: No JVD; LYMPHATICS: No lymphadenopathy CARDIAC: RRR, distant heart sounds. No murmurs, rubs, gallops RESPIRATORY:  Clear to auscultation without rales, wheezing or rhonchi  ABDOMEN: Soft, non-tender, non-distended MUSCULOSKELETAL:  No edema; No deformity  SKIN: Warm and dry.   NEUROLOGIC:  Alert and oriented x 3 PSYCHIATRIC:  Normal affect   ASSESSMENT:    1. S/P TAVR (transcatheter aortic valve replacement)   2. S/P placement of cardiac pacemaker   3. Primary hypertension   4. Persistent atrial fibrillation ([place])   5. Morbid obesity ([place])   6. Testicle tenderness     PLAN:    In order of problems listed above:  Severe AS s/p TAVR: echo today showed EF 65%, normally functioning TAVR with a mean gradient of 9 mmHg and no PVL. Has been on Xarelto and aspirin. He has NYHA class III symptoms. ( See below). He can stop aspirin after 6 months (09/2021). SBE prophylaxis discussed; he has amoxicillin. I will see him back in 1 year with an echo.   CHB s/p PPM with pocket hematoma: this has resolved. Now back on Xarelto.    HTN: BP well controlled today. Continue current medications.    Persistent atrial fibrillation: continue  Xarelto.    Morbid obesity: Body mass index is 53.73 kg/m. Working hard on changing his diet and exercising  Painful testicle: followed by Dr. Claudia Desanctis. This has improved but still bothers him.   Chronic diastolic CHF: pt has been mistakenly taking Lasix 80mg  daily (instead of 40mg  dailg) and Kdur 31meq daily. He appears euvolemic, but I am concerned about his kidney function and electrolytes. Will check labs today. If stable, will keep him on same.   Dyspnea: pt complains that dyspnea is  worse than before his TAVR. He says his volume status (LE edema) is the best it has been in years. His pacemaker was adjusted to increase his HR. Echo today looks great with normal EF and valve function. 02 sat is 94%. He does have RFs for a PE with sedentary lifestyle and obesity. Will check a CTA to rule out PE. Echo shows normal RV and also on Xarelto which makes this less likely but will check just to be sure (also Louanna Raw was inturrupted for a pocket hematoma).  If that is normal, I think his dyspnea is weight related. He understands this and is excited to participate in cardiac rehab.   Click here to see a list of contraindications to cardiac rehab    :1}  Cardiac Rehabilitation Eligibility Assessment: The patient is ready to start Cardiac Rehab from a cardiac standpoint.      Medication Adjustments/Labs and Tests Ordered: Current medicines are reviewed at length with the patient today.  Concerns regarding medicines are outlined above.  Orders Placed This Encounter  Procedures   CT Angio Chest Pulmonary Embolism (PE) W or WO Contrast   Basic metabolic panel     Meds ordered this encounter  Medications   aspirin 81 MG chewable tablet    Sig: Chew 1 tablet (81 mg total) by mouth daily.    Dispense:  90 tablet    Refill:  1      Patient Instructions  Medication Instructions:  No changes. Ok to continue on lasix 80 mg daily for now  *If you need a refill on your cardiac medications before your  next appointment, please call your pharmacy*   Lab Work: Today: BMET  If you have labs (blood work) drawn today and your tests are completely normal, you will receive your results only by: [place] (if you have MyChart) OR A paper copy in the mail If you have any lab test that is abnormal or we need to change your treatment, we will call you to review the results.   Testing/Procedures: .instct   Follow-Up: At [facility], you and your health needs are our priority.  As part of our continuing mission to provide you with exceptional heart care, we have created designated Provider Care Teams.  These Care Teams include your primary Cardiologist (physician) and Advanced Practice Providers (APPs -  Physician Assistants and Nurse Practitioners) who all work together to provide you with the care you need, when you need it.   Your next appointment:   4 month(s)  The format for your next appointment:   In Person  Provider:   You may see [name], MD or one of the following Advanced Practice Providers on your designated Care Team:   Almyra Deforest, PA-C Fabian Sharp, PA-C or  Roby Lofts, PA-C   Other Instructions     Signed, Angelena Form, Hershal Coria  05/17/2021 3:20 PM    [place]

## 2021-05-17 ENCOUNTER — Ambulatory Visit (HOSPITAL_COMMUNITY): Payer: 59 | Attending: Physician Assistant

## 2021-05-17 ENCOUNTER — Ambulatory Visit: Payer: 59 | Admitting: Physician Assistant

## 2021-05-17 ENCOUNTER — Other Ambulatory Visit: Payer: Self-pay

## 2021-05-17 ENCOUNTER — Encounter: Payer: Self-pay | Admitting: Physician Assistant

## 2021-05-17 VITALS — BP 126/68 | HR 76 | Ht 75.0 in | Wt >= 6400 oz

## 2021-05-17 DIAGNOSIS — Z952 Presence of prosthetic heart valve: Secondary | ICD-10-CM

## 2021-05-17 DIAGNOSIS — Z950 Presence of cardiac pacemaker: Secondary | ICD-10-CM | POA: Diagnosis not present

## 2021-05-17 DIAGNOSIS — I4819 Other persistent atrial fibrillation: Secondary | ICD-10-CM

## 2021-05-17 DIAGNOSIS — I10 Essential (primary) hypertension: Secondary | ICD-10-CM | POA: Diagnosis not present

## 2021-05-17 DIAGNOSIS — N50819 Testicular pain, unspecified: Secondary | ICD-10-CM

## 2021-05-17 LAB — ECHOCARDIOGRAM COMPLETE
AR max vel: 2.14 cm2
AV Area VTI: 2.22 cm2
AV Area mean vel: 2.22 cm2
AV Mean grad: 9.3 mmHg
AV Peak grad: 17 mmHg
Ao pk vel: 2.06 m/s
Area-P 1/2: 1.77 cm2
MV VTI: 2.57 cm2
S' Lateral: 3.3 cm

## 2021-05-17 MED ORDER — ASPIRIN 81 MG PO CHEW: 81.0000 mg | CHEWABLE_TABLET | Freq: Every day | ORAL | 1 refills | Status: AC

## 2021-05-17 MED ORDER — PERFLUTREN LIPID MICROSPHERE
1.0000 mL | INTRAVENOUS | Status: AC | PRN
  Administered 2021-05-17: 1 mL via INTRAVENOUS

## 2021-05-17 NOTE — Patient Instructions (Addendum)
Medication Instructions:  No changes. Ok to continue on lasix 80 mg daily for now  *If you need a refill on your cardiac medications before your next appointment, please call your pharmacy*   Lab Work: Today: BMET  If you have labs (blood work) drawn today and your tests are completely normal, you will receive your results only by: [place] (if you have MyChart) OR A paper copy in the mail If you have any lab test that is abnormal or we need to change your treatment, we will call you to review the results.   Testing/Procedures: CHEST R/O PE, STAT Non-Cardiac CT Angiography (CTA), is a special type of CT scan that uses a computer to produce multi-dimensional views of major blood vessels throughout the body. In CT angiography, a contrast material is injected through an IV to help visualize the blood vessels   Follow-Up: At [facility], you and your health needs are our priority.  As part of our continuing mission to provide you with exceptional heart care, we have created designated Provider Care Teams.  These Care Teams include your primary Cardiologist (physician) and Advanced Practice Providers (APPs -  Physician Assistants and Nurse Practitioners) who all work together to provide you with the care you need, when you need it.   Your next appointment:   3-4 month(s)  The format for your next appointment:   In Person  Provider:   You may see [name], MD or one of the following Advanced Practice Providers on your designated Care Team:   Almyra Deforest, PA-C Fabian Sharp, PA-C or  Roby Lofts, [place]

## 2021-05-18 LAB — BASIC METABOLIC PANEL
BUN/Creatinine Ratio: 26 — ABNORMAL HIGH (ref 10–24)
BUN: 36 mg/dL — ABNORMAL HIGH (ref 8–27)
CO2: 21 mmol/L (ref 20–29)
Calcium: 9.7 mg/dL (ref 8.6–10.2)
Chloride: 100 mmol/L (ref 96–106)
Creatinine, Ser: 1.36 mg/dL — ABNORMAL HIGH (ref 0.76–1.27)
Glucose: 105 mg/dL — ABNORMAL HIGH (ref 70–99)
Potassium: 4 mmol/L (ref 3.5–5.2)
Sodium: 139 mmol/L (ref 134–144)
eGFR: 58 mL/min/{1.73_m2} — ABNORMAL LOW (ref >59)

## 2021-05-19 ENCOUNTER — Other Ambulatory Visit: Payer: Self-pay

## 2021-05-19 ENCOUNTER — Ambulatory Visit (INDEPENDENT_AMBULATORY_CARE_PROVIDER_SITE_OTHER)
Admission: RE | Admit: 2021-05-19 | Discharge: 2021-05-19 | Disposition: A | Discharge status: Home / Self Care | Payer: 59 | Source: Ambulatory Visit | Attending: Physician Assistant | Admitting: Physician Assistant

## 2021-05-19 DIAGNOSIS — Z952 Presence of prosthetic heart valve: Secondary | ICD-10-CM | POA: Diagnosis not present

## 2021-05-19 DIAGNOSIS — I10 Essential (primary) hypertension: Secondary | ICD-10-CM | POA: Diagnosis not present

## 2021-05-19 DIAGNOSIS — N50819 Testicular pain, unspecified: Secondary | ICD-10-CM

## 2021-05-19 DIAGNOSIS — Z950 Presence of cardiac pacemaker: Secondary | ICD-10-CM

## 2021-05-19 DIAGNOSIS — I4819 Other persistent atrial fibrillation: Secondary | ICD-10-CM

## 2021-05-19 MED ORDER — IOHEXOL 350 MG/ML SOLN
80.0000 mL | Freq: Once | INTRAVENOUS | Status: AC | PRN
  Administered 2021-05-19: 80 mL via INTRAVENOUS

## 2021-05-22 ENCOUNTER — Telehealth (HOSPITAL_COMMUNITY): Payer: Self-pay

## 2021-05-22 NOTE — Telephone Encounter (Signed)
Called patient to see if he was interested in participating in the Cardiac Rehab Program. Patient stated yes. Patient will come in for orientation on 06/15/21 @ 215PM and will attend the 3PM exercise class.   Tourist information centre manager.

## 2021-05-30 ENCOUNTER — Telehealth (INDEPENDENT_AMBULATORY_CARE_PROVIDER_SITE_OTHER): Payer: 59 | Admitting: Family Medicine

## 2021-05-30 ENCOUNTER — Encounter: Payer: Self-pay | Admitting: Family Medicine

## 2021-05-30 ENCOUNTER — Other Ambulatory Visit: Payer: Self-pay

## 2021-05-30 VITALS — Temp 99.0°F | Wt >= 6400 oz

## 2021-05-30 DIAGNOSIS — U071 COVID-19: Secondary | ICD-10-CM | POA: Diagnosis not present

## 2021-05-30 MED ORDER — BENZONATATE 100 MG PO CAPS
200.0000 mg | ORAL_CAPSULE | Freq: Two times a day (BID) | ORAL | 0 refills | Status: DC | PRN
Start: 2021-05-30 — End: 2021-08-22

## 2021-05-30 NOTE — Progress Notes (Signed)
   Subjective:    Patient ID: David [name], male    DOB: [birth year], 65 y.o.   MRN: [id number]  HPI Documentation for virtual audio and video telecommunications through [place] encounter: The patient was located at home. 2 patient identifiers used.  The provider was located in the office. The patient did consent to this visit and is aware of possible charges through their insurance for this visit. The other persons participating in this telemedicine service were none. Time spent on call was 5 minutes and in review of previous records >12 minutes total for counseling and coordination of care. This virtual service is not related to other E/M service within previous 7 days.  He states that last Thursday he developed a slight cough and did test for COVID which was negative.  He then developed malaise, fever and continued difficulty with a dry cough.  He tested again twice more which was negative but today he tested and was positive.  He states that he is feeling 50% better but is still having a dry cough as well as sore throat but no other symptoms.  Review of Systems     Objective:   Physical Exam Alert and fatigued looking with a slightly hoarse voice       Assessment & Plan:  COVID-19 - Plan: benzonatate (TESSALON) 100 MG capsule I explained that since he is now 5 days since the start of this and feeling about 50% better, he is probably turned the corner.  Recommend supportive care with Tylenol, gargling's and I will call in [place].  He will call if he has worsening of his symptoms.

## 2021-06-14 ENCOUNTER — Telehealth (HOSPITAL_COMMUNITY): Payer: Self-pay | Admitting: *Deleted

## 2021-06-14 NOTE — Telephone Encounter (Signed)
Left message to call cardiac rehab regarding cardiac rehab orientation appointment tomorrow.Barnet Pall, RN,BSN 06/14/2021 3:32 PM

## 2021-06-14 NOTE — Telephone Encounter (Signed)
Spoke with Mr David [name] who is recovering from having a COVID 19 infection. Mr David [name] reports that he is still experiencing a cough and nasal congestion. I advised that Mr David [name] not attend orientation tomorrow as he is still experiencing symptoms. I told Mr David [name] to follow up with Dr Redmond School if needed. Will cancel tomorrows orientation and exercise appointments. Will follow up with the patient in a few weeks to reschedule.Barnet Pall, RN,BSN 06/14/2021 4:47 PM

## 2021-06-15 ENCOUNTER — Inpatient Hospital Stay (HOSPITAL_COMMUNITY): Admission: RE | Admit: 2021-06-15 | Payer: 59 | Source: Ambulatory Visit

## 2021-06-16 ENCOUNTER — Encounter (HOSPITAL_COMMUNITY): Payer: Self-pay

## 2021-06-16 ENCOUNTER — Telehealth (HOSPITAL_COMMUNITY): Payer: Self-pay

## 2021-06-16 NOTE — Telephone Encounter (Signed)
Attempted to call patient in regards to Cardiac Rehab - LM on VM Mailed letter 

## 2021-06-19 ENCOUNTER — Ambulatory Visit (HOSPITAL_COMMUNITY): Payer: 59

## 2021-06-21 ENCOUNTER — Ambulatory Visit (HOSPITAL_COMMUNITY): Payer: 59

## 2021-06-23 ENCOUNTER — Ambulatory Visit (HOSPITAL_COMMUNITY): Payer: 59

## 2021-06-26 ENCOUNTER — Ambulatory Visit (HOSPITAL_COMMUNITY): Payer: 59

## 2021-06-28 ENCOUNTER — Ambulatory Visit (HOSPITAL_COMMUNITY): Payer: 59

## 2021-06-29 NOTE — Telephone Encounter (Signed)
Pt letter came back returned to sender states undeliverable address. Called pt and left a message to see if he can give [place] the proper address to send. The PO box on file is not a valid address.

## 2021-06-30 ENCOUNTER — Ambulatory Visit (HOSPITAL_COMMUNITY): Payer: 59

## 2021-07-03 ENCOUNTER — Ambulatory Visit (HOSPITAL_COMMUNITY): Payer: 59

## 2021-07-05 ENCOUNTER — Ambulatory Visit (HOSPITAL_COMMUNITY): Payer: 59

## 2021-07-07 ENCOUNTER — Ambulatory Visit (HOSPITAL_COMMUNITY): Payer: 59

## 2021-07-10 ENCOUNTER — Ambulatory Visit (HOSPITAL_COMMUNITY): Payer: 59

## 2021-07-10 NOTE — Telephone Encounter (Signed)
No response from pt in regards to Cardiac Rehab. Closed referral               

## 2021-07-11 ENCOUNTER — Other Ambulatory Visit: Payer: Self-pay | Admitting: Family Medicine

## 2021-07-11 DIAGNOSIS — I10 Essential (primary) hypertension: Secondary | ICD-10-CM

## 2021-07-12 ENCOUNTER — Telehealth: Payer: Self-pay | Admitting: Physician Assistant

## 2021-07-12 ENCOUNTER — Ambulatory Visit (HOSPITAL_COMMUNITY): Payer: 59

## 2021-07-12 MED ORDER — FUROSEMIDE 80 MG PO TABS: 80.0000 mg | ORAL_TABLET | Freq: Every day | ORAL | 3 refills | Status: DC

## 2021-07-12 NOTE — Telephone Encounter (Signed)
Per lab results from 05/17/21 Angelena Form, PA-C: Creat and K look stable. Lets keep him on the 80mg  daily of lasix and 20 Meq of Kdur.  I called pt and reviewed provider instructions and verified pharmacy to send updated prescription to.  Pt reports he is taking potassium supplement as ordered.  All questions answered pt verbalizes understanding.

## 2021-07-12 NOTE — Telephone Encounter (Signed)
  Pt c/o medication issue:  1. Name of Medication: furosemide (LASIX) 40 MG tablet  2. How are you currently taking this medication (dosage and times per day)? Take 1 tablet (40 mg total) by mouth daily.Patient taking   3. Are you having a reaction (difficulty breathing--STAT)?   4. What is your medication issue? Pt said, Angelena Form changes his dosage to take lasix 40 mg twice a day, he ran out of his meds and needs refill to his pharmacy with new dosage

## 2021-07-14 ENCOUNTER — Ambulatory Visit (HOSPITAL_COMMUNITY): Payer: 59

## 2021-07-17 ENCOUNTER — Ambulatory Visit (HOSPITAL_COMMUNITY): Payer: 59

## 2021-07-19 ENCOUNTER — Ambulatory Visit (INDEPENDENT_AMBULATORY_CARE_PROVIDER_SITE_OTHER): Payer: 59

## 2021-07-19 ENCOUNTER — Ambulatory Visit (HOSPITAL_COMMUNITY): Payer: 59

## 2021-07-19 DIAGNOSIS — I442 Atrioventricular block, complete: Secondary | ICD-10-CM

## 2021-07-19 LAB — CUP PACEART REMOTE DEVICE CHECK
Battery Remaining Longevity: 120 mo
Battery Voltage: 3.09 V
Brady Statistic RA Percent Paced: 0 %
Brady Statistic RV Percent Paced: 73.67 %
Date Time Interrogation Session: 20221129225608
Implantable Lead Implant Date: 20220831
Implantable Lead Implant Date: 20220831
Implantable Lead Location: 753859
Implantable Lead Location: 753860
Implantable Lead Model: 5076
Implantable Lead Model: 5076
Implantable Pulse Generator Implant Date: 20220831
Lead Channel Impedance Value: 342 Ohm
Lead Channel Impedance Value: 399 Ohm
Lead Channel Impedance Value: 494 Ohm
Lead Channel Impedance Value: 513 Ohm
Lead Channel Pacing Threshold Amplitude: 0.625 V
Lead Channel Pacing Threshold Pulse Width: 0.4 ms
Lead Channel Sensing Intrinsic Amplitude: 2.375 mV
Lead Channel Sensing Intrinsic Amplitude: 2.375 mV
Lead Channel Sensing Intrinsic Amplitude: 2.875 mV
Lead Channel Sensing Intrinsic Amplitude: 2.875 mV
Lead Channel Setting Pacing Amplitude: 3.5 V
Lead Channel Setting Pacing Amplitude: 3.5 V
Lead Channel Setting Pacing Pulse Width: 0.4 ms
Lead Channel Setting Sensing Sensitivity: 0.9 mV

## 2021-07-21 ENCOUNTER — Ambulatory Visit (HOSPITAL_COMMUNITY): Payer: 59

## 2021-07-24 ENCOUNTER — Ambulatory Visit (HOSPITAL_COMMUNITY): Payer: 59

## 2021-07-25 ENCOUNTER — Encounter: Payer: 59 | Admitting: Cardiology

## 2021-07-26 ENCOUNTER — Ambulatory Visit (HOSPITAL_COMMUNITY): Payer: 59

## 2021-07-27 NOTE — Progress Notes (Signed)
Remote pacemaker transmission.   

## 2021-07-28 ENCOUNTER — Ambulatory Visit (HOSPITAL_COMMUNITY): Payer: 59

## 2021-07-30 ENCOUNTER — Other Ambulatory Visit: Payer: Self-pay | Admitting: Family Medicine

## 2021-07-30 DIAGNOSIS — I10 Essential (primary) hypertension: Secondary | ICD-10-CM

## 2021-07-30 DIAGNOSIS — I480 Paroxysmal atrial fibrillation: Secondary | ICD-10-CM

## 2021-07-30 DIAGNOSIS — I63412 Cerebral infarction due to embolism of left middle cerebral artery: Secondary | ICD-10-CM

## 2021-07-31 ENCOUNTER — Ambulatory Visit (HOSPITAL_COMMUNITY): Payer: 59

## 2021-08-02 ENCOUNTER — Ambulatory Visit (HOSPITAL_COMMUNITY): Payer: 59

## 2021-08-04 ENCOUNTER — Ambulatory Visit (HOSPITAL_COMMUNITY): Payer: 59

## 2021-08-04 ENCOUNTER — Other Ambulatory Visit: Payer: Self-pay | Admitting: Family Medicine

## 2021-08-04 DIAGNOSIS — M62838 Other muscle spasm: Secondary | ICD-10-CM

## 2021-08-04 NOTE — Telephone Encounter (Signed)
[facility] is requesting to fill pt robaxin. Please advise [facility]

## 2021-08-07 ENCOUNTER — Ambulatory Visit (HOSPITAL_COMMUNITY): Payer: 59

## 2021-08-07 ENCOUNTER — Telehealth: Payer: Self-pay | Admitting: Family Medicine

## 2021-08-07 DIAGNOSIS — M109 Gout, unspecified: Secondary | ICD-10-CM

## 2021-08-07 MED ORDER — COLCHICINE 0.6 MG PO TABS
0.6000 mg | ORAL_TABLET | Freq: Two times a day (BID) | ORAL | 1 refills | Status: DC
Start: 2021-08-07 — End: 2021-08-22

## 2021-08-07 NOTE — Telephone Encounter (Signed)
Pt called and states that his gout has flared up again and he can barely walk, he wants to know if you would send him something in for it,  Pt uses  [facility], [address] AT [place]

## 2021-08-07 NOTE — Telephone Encounter (Signed)
Pt advised he does need this med.[place]

## 2021-08-07 NOTE — Telephone Encounter (Signed)
Lvm for pt advising of info from DR. Lalonde.

## 2021-08-09 ENCOUNTER — Ambulatory Visit (HOSPITAL_COMMUNITY): Payer: 59

## 2021-08-11 ENCOUNTER — Ambulatory Visit (HOSPITAL_COMMUNITY): Payer: 59

## 2021-08-21 NOTE — Progress Notes (Signed)
Office Visit    Patient Name: David [name] Date of Encounter: 08/22/2021  PCP:  Denita Lung, MD   [place]  Cardiologist:  [name], MD  Advanced Practice Provider:  No care team member to display Electrophysiologist:  None    Chief Complaint    David [name] is a 66 y.o. male with a hx of hypertension, hyperlipidemia, prior CVA, super morbid obesity, persistent atrial fibrillation on Xarelto, RBBB, sleep apnea, former tobacco abuse and severe aortic stenosis status post TAVR (1/76/16) complicated by CHB status post PPM (04/19/21) presents today for routine follow-up.   Past Medical History    Past Medical History:  Diagnosis Date   Allergy    RHINITIS   Arthritis    thumb   Complication of anesthesia    2012 shoulder surgery: patient unsure of what the problem was    GERD (gastroesophageal reflux disease)    "not really"   History of echocardiogram 03/29/2009   EF 50-55%; mild concentric LVH;    Hx of adenomatous colonic polyps    Hyperlipidemia    Hypertension    takes Diovan and Toprol   Obesity    PAF (paroxysmal atrial fibrillation) (HCC)    associated with OSA   Right bundle branch block    Rosacea    S/P placement of cardiac pacemaker 04/19/2021   Successful implantation of a [employer]tronic Azure XT DR MRI SureScan dual-chamber pacemaker for complete AV block   S/P TAVR (transcatheter aortic valve replacement) 04/18/2021   s/p TAVR with a 29 mm Edwards S3 via the TF approach by Dr. Angelena Form and Dr. Cyndia Bent   Shortness of breath    With activity   Sleep apnea    BiPap   Smoker    Stroke ([place]) 06/2016   "mini stroke"   Past Surgical History:  Procedure Laterality Date   COLONOSCOPY WITH PROPOFOL N/A 11/25/2020   Procedure: COLONOSCOPY WITH PROPOFOL;  Surgeon: Carol Ada, MD;  Location: [facility];  Service: Endoscopy;  Laterality: N/A;   FINGER SURGERY     Reconstruction of Left thumb and L index finger.  Reconstruction     HEMOSTASIS CLIP PLACEMENT  11/25/2020   Procedure: HEMOSTASIS CLIP PLACEMENT;  Surgeon: Carol Ada, MD;  Location: [facility];  Service: Endoscopy;;   KNEE ARTHROSCOPY     MULTIPLE EXTRACTIONS WITH ALVEOLOPLASTY N/A 04/11/2021   Procedure: MULTIPLE EXTRACTION WITH ALVEOLOPLASTY;  Surgeon: Charlaine Dalton, DMD;  Location: [facility];  Service: Dentistry;  Laterality: N/A;   PACEMAKER IMPLANT N/A 04/19/2021   Procedure: PACEMAKER IMPLANT;  Surgeon: Constance Haw, MD;  Location: [facility];  Service: Cardiovascular;  Laterality: N/A;   POLYPECTOMY  11/25/2020   Procedure: POLYPECTOMY;  Surgeon: Carol Ada, MD;  Location: [facility];  Service: Endoscopy;;   RIGHT/LEFT HEART CATH AND CORONARY ANGIOGRAPHY N/A 03/23/2021   Procedure: RIGHT/LEFT HEART CATH AND CORONARY ANGIOGRAPHY;  Surgeon: Burnell Blanks, MD;  Location: [facility];  Service: Cardiovascular;  Laterality: N/A;   SHOULDER CLOSED REDUCTION     Procedure: CLOSED REDUCTION SHOULDER;  Surgeon: Lawrence [facility];  Location: [facility];  Service: Orthopedics;  Laterality: Right;   SHOULDER OPEN ROTATOR CUFF REPAIR  08/11/2011   Procedure: ROTATOR CUFF REPAIR SHOULDER OPEN;  Surgeon: Willa Frater III;  Location: [facility];  Service: Orthopedics;  Laterality: Right;   SUBMUCOSAL LIFTING INJECTION  11/25/2020   Procedure: SUBMUCOSAL LIFTING INJECTION;  Surgeon: Carol Ada,  MD;  [facility]: [facility];  Service: Endoscopy;;   SUBMUCOSAL TATTOO INJECTION  11/25/2020   Procedure: SUBMUCOSAL TATTOO INJECTION;  Surgeon: Carol Ada, MD;  [facility]: [facility];  Service: Endoscopy;;   TEE WITHOUT CARDIOVERSION N/A 04/18/2021   Procedure: TRANSESOPHAGEAL ECHOCARDIOGRAM (TEE);  Surgeon: Burnell Blanks, MD;  Location: [place] [facility];  Service: Open Heart Surgery;  Laterality: N/A;   TONSILLECTOMY     as a child   TRANSCATHETER AORTIC VALVE REPLACEMENT, TRANSFEMORAL N/A 04/18/2021   Procedure: TRANSCATHETER  AORTIC VALVE REPLACEMENT, TRANSFEMORAL;  Surgeon: Burnell Blanks, MD;  Location: [place] [facility];  Service: Open Heart Surgery;  Laterality: N/A;    Allergies  Allergies  Allergen Reactions   Codeine Shortness Of Breath and Swelling    Patient has tolerated hydrocodone and Dilaudid.   Morphine And Related Shortness Of Breath and Swelling    Patient has tolerated hydrocodone and Dilaudid.     History of Present Illness    David [name] is a 66 y.o. male with a hx of hypertension, hyperlipidemia, prior CVA, super morbid obesity, persistent atrial fibrillation on Xarelto, RBBB, sleep apnea, former tobacco abuse and severe aortic stenosis status post TAVR (2/29/79) complicated by CHB status post PPM (04/19/21) presents today for routine follow-up last seen 05/09/2021 by Nell Range, PA-C.  Is maintained on Xarelto for his persistent atrial fibrillation.  Dr. Debara Pickett has been following his aortic stenosis with periodic echocardiograms.  His most recent echo on 03/02/2021 showed a trileaflet aortic valve with severe calcification and thickening and restricted leaflet ability.  The mean gradient was 49 mmHg with valve area by VTI of 0.67 cm consistent with severe aortic stenosis.  The patient underwent dental extraction on 04/11/2021 prior to the procedure.  He was evaluated by the multidisciplinary valve team and underwent successful TAVR with a 29 mm Edwards SAPIEN 3 ultra THP via transfemoral approach on 04/18/2021.  Procedure was complicated by development of CHB after valve deployment and was pacer dependent.  He is now status post successful implantation of a dual-chamber pacemaker on 04/19/2021 by Dr. Curt Bears.  Postoperative echocardiogram showed EF 60%, normal functioning TAVR with a mean gradient of 8 mmHg and trivial PVL.  He was resumed on his home anticoagulation Xarelto with the addition of a baby aspirin x6 months.  He was last seen in the clinic by Nell Range, PA-C on 04/2021.   He had been taking Lasix 80 mg daily mistakenly (he was told to take 80 mg for a few days).  He thinks it has really helped his swelling but not his shortness of breath.  He still had NYHA class III symptoms.  No chest pain, orthopnea, dizziness, or syncope.  No blood in his stool or urine.  His testicles are still bothering him at this time.  Today, he overall is doing well from a fluid standpoint.  He is encouraged to continue daily weights in the morning and to continue his current diuretic regimen of 80 mg of Lasix daily.  He has been making an effort to make healthier dietary changes to promote weight loss.  He does state that he needs a knee replacement on the left side which holds him back from exercise.  His biggest complaint is that she has unbearable pain in his left lower leg which has gotten slightly better after his TAVR procedure.  He stated that he has had this worked up before and all of the tests come back normal.  He does  have a venous ultrasound that was performed January 2022 which showed venous reflux of the left greater saphenous vein. He feels his SOB improved since his TAVR and being on the higher dose of lasix. He works at the [employer] and is on his feet for 12-14 hours some days. He does have some lower extremity edema at this time. So, we provided him some education on elastic therapy, graduated compression, and trying 15-20 mmhg. No medication changes were needed today.   Reports no shortness of breath nor dyspnea on exertion. Reports no chest pain, pressure, or tightness. No edema, orthopnea, PND. Reports no palpitations.      EKGs/Labs/Other Studies Reviewed:   The following studies were reviewed today:  05/17/2021 Echocardiogram  IMPRESSIONS Compared to 04/19/21 no significant change; LV functon is normal; s/p AVR with mean gradient 9 mmHg; previously described trivial AI not evident on present study. 1. Left ventricular ejection fraction, by estimation, is 60  to 65%. The left ventricle has normal function. The left ventricle has no regional wall motion abnormalities. There is mild left ventricular hypertrophy. Left ventricular diastolic parameters are indeterminate. 2. Right ventricular systolic function is normal. The right ventricular size is normal. There is normal pulmonary artery systolic pressure. 3. The mitral valve is normal in structure. Trivial mitral valve regurgitation. No evidence of mitral stenosis. 4. The aortic valve has been repaired/replaced. Aortic valve regurgitation is not visualized. No aortic stenosis is present. There is a 29 mm Ultra, stented (TAVR) valve present in the aortic position. Procedure Date: 04/18/21. 5. The inferior vena cava is normal in size with greater than 50% respiratory variability, suggesting right atrial pressure of 3 mmHg. 6. Comparison(s): 04/19/21 EF 60-65%. AV 82mmHg mean PG, 3mmHg peak PG.  EKG:  EKG is not ordered today.    Recent Labs: 04/14/2021: ALT 27 04/19/2021: Magnesium 2.1 04/27/2021: Hemoglobin 13.6; NT-Pro BNP 1,279; Platelets 240 05/17/2021: BUN 36; Creatinine, Ser 1.36; Potassium 4.0; Sodium 139  Recent Lipid Panel    Component Value Date/Time   CHOL 127 09/15/2020 1629   TRIG 93 09/15/2020 1629   HDL 29 (L) 09/15/2020 1629   CHOLHDL 4.4 09/15/2020 1629   CHOLHDL 6.8 06/22/2016 0318   VLDL 22 06/22/2016 0318   LDLCALC 80 09/15/2020 1629    Risk Assessment/Calculations:   CHA2DS2-VASc Score = 3   This indicates a 3.2% annual risk of stroke. The patient's score is based upon: CHF History: 0 HTN History: 1 Diabetes History: 0 Stroke History: 2 Vascular Disease History: 0 Age Score: 0 Gender Score: 0     Home Medications   Current Meds  Medication Sig   ALPRAZolam (XANAX) 1 MG tablet Take 1 tablet (1 mg total) by mouth at bedtime as needed for sleep.   amoxicillin (AMOXIL) 500 MG tablet Take 4 tablets by mouth 60 minutes before any dental appointments    aspirin 81 MG chewable tablet Chew 1 tablet (81 mg total) by mouth daily.   atorvastatin (LIPITOR) 10 MG tablet TAKE 1 TABLET BY MOUTH EVERY DAY AT 6 PM   diltiazem (CARDIZEM CD) 120 MG 24 hr capsule TAKE 1 CAPSULE(120 MG) BY MOUTH DAILY   furosemide (LASIX) 80 MG tablet Take 1 tablet (80 mg total) by mouth daily.   hydrocortisone 2.5 % cream Apply to external ears as needed for itching.   methocarbamol (ROBAXIN) 500 MG tablet TAKE 1 TABLET(500 MG) BY MOUTH FOUR TIMES DAILY AS NEEDED FOR MUSCLE SPASMS   metoprolol succinate (TOPROL-XL) 50 MG 24  hr tablet TAKE 1 TABLET BY MOUTH WITH OR IMMEDIATLY FOLLOWING A MEAL   potassium chloride SA (KLOR-CON) 20 MEQ tablet Take 1 tablet (20 mEq total) by mouth daily.   valsartan-hydrochlorothiazide (DIOVAN-HCT) 160-25 MG tablet TAKE 1 TABLET BY MOUTH DAILY   XARELTO 20 MG TABS tablet TAKE 1 TABLET(20 MG) BY MOUTH DAILY WITH SUPPER     Review of Systems      All other systems reviewed and are otherwise negative except as noted above.  Physical Exam    VS:  BP 118/60    Pulse 80    Ht 6\' 3"  (1.905 m)    Wt (!) 418 lb 3.2 oz (189.7 kg)    SpO2 95%    BMI 52.27 kg/m  , BMI Body mass index is 52.27 kg/m.  Wt Readings from Last 3 Encounters:  08/22/21 (!) 418 lb 3.2 oz (189.7 kg)  05/30/21 (!) 419 lb (190.1 kg)  05/17/21 (!) 419 lb (190.1 kg)     GEN: Well nourished, well developed, in no acute distress. HEENT: normal. Neck: Supple, no JVD, carotid bruits, or masses. Cardiac: RRR, no murmurs, rubs, or gallops. No clubbing, cyanosis, edema.  Radials/PT 2+ and equal bilaterally.  Respiratory:  Respirations regular and unlabored, clear to auscultation bilaterally. GI: Soft, nontender, nondistended. MS: No deformity or atrophy. Skin: Warm and dry, no rash. Neuro:  Strength and sensation are intact. Psych: Normal affect.  Assessment & Plan    Severe aortic stenosis status post TAVR -Recent echocardiogram showed EF 65%, normal functioning TAVR with  mean gradient of 9 mmHg and no PVL. -Continue Xarelto and aspirin (he can stop aspirin after 6 months 09/2021) -No signs of bleeding in stool or urine -He does have occasional nose bleeds every few months but recent CBC stable -He will need a follow-up echocardiogram in 1 year (9/23)  CHB status post PPM complicated by pocket hematoma -resolved hematoma  Hypertension -Well-controlled on current medication regimen -Continue to monitor blood pressure at home -Continue low-sodium diet  Persistent atrial fibrillation -Continue Xarelto  Chronic diastolic CHF -He was initially on 80 mg daily of Lasix and this was continued.  -Recent BMP with creatinine 1.36 which appears to be about his baseline -Continue current diuretic regimen -Bilateral lower extremity edema improved per the patient  Dyspnea -thought to be weight related. Work-up unremarkable  Morbid obesity -Working on dietary and exercise modifications  Painful testicle -Followed by Dr. Claudia Desanctis  9. PAD? /venous reflux -Discussed further testing with ABIs and possible need for repeat US. -Patient deferred further studies at this time -He had pervious cellulitis on his left side and this is the side that gives him more pain with and without activity -He was told his pain was due to neuropathy   Disposition: Follow up in 6 month(s) with [name], MD or APP.  Signed, Elgie Collard, PA-C 08/22/2021, 5:09 PM [place]

## 2021-08-22 ENCOUNTER — Encounter: Payer: Self-pay | Admitting: Physician Assistant

## 2021-08-22 ENCOUNTER — Ambulatory Visit: Payer: 59 | Admitting: Physician Assistant

## 2021-08-22 ENCOUNTER — Other Ambulatory Visit: Payer: Self-pay

## 2021-08-22 VITALS — BP 118/60 | HR 80 | Ht 75.0 in | Wt >= 6400 oz

## 2021-08-22 DIAGNOSIS — Z952 Presence of prosthetic heart valve: Secondary | ICD-10-CM | POA: Diagnosis not present

## 2021-08-22 DIAGNOSIS — I4819 Other persistent atrial fibrillation: Secondary | ICD-10-CM

## 2021-08-22 DIAGNOSIS — Z950 Presence of cardiac pacemaker: Secondary | ICD-10-CM | POA: Diagnosis not present

## 2021-08-22 DIAGNOSIS — N50819 Testicular pain, unspecified: Secondary | ICD-10-CM | POA: Diagnosis not present

## 2021-08-22 DIAGNOSIS — D6869 Other thrombophilia: Secondary | ICD-10-CM

## 2021-08-22 DIAGNOSIS — I5033 Acute on chronic diastolic (congestive) heart failure: Secondary | ICD-10-CM

## 2021-08-22 DIAGNOSIS — I4891 Unspecified atrial fibrillation: Secondary | ICD-10-CM

## 2021-08-22 DIAGNOSIS — I10 Essential (primary) hypertension: Secondary | ICD-10-CM

## 2021-08-22 NOTE — Patient Instructions (Signed)
Medication Instructions:  No changes *If you need a refill on your cardiac medications before your next appointment, please call your pharmacy*   Lab Work: No labs If you have labs (blood work) drawn today and your tests are completely normal, you will receive your results only by: [place] (if you have MyChart) OR A paper copy in the mail If you have any lab test that is abnormal or we need to change your treatment, we will call you to review the results.   Testing/Procedures: No Testing   Follow-Up: At [facility], you and your health needs are our priority.  As part of our continuing mission to provide you with exceptional heart care, we have created designated Provider Care Teams.  These Care Teams include your primary Cardiologist (physician) and Advanced Practice Providers (APPs -  Physician Assistants and Nurse Practitioners) who all work together to provide you with the care you need, when you need it.  We recommend signing up for the patient portal called "MyChart".  Sign up information is provided on this After Visit Summary.  MyChart is used to connect with patients for Virtual Visits (Telemedicine).  Patients are able to view lab/test results, encounter notes, upcoming appointments, etc.  Non-urgent messages can be sent to your provider as well.   To learn more about what you can do with MyChart, go to [email or web address].    Your next appointment:   6 month(s)  The format for your next appointment:   In Person  Provider:   [name], MD

## 2021-08-25 ENCOUNTER — Other Ambulatory Visit: Payer: Self-pay

## 2021-08-25 ENCOUNTER — Encounter: Payer: Self-pay | Admitting: Cardiology

## 2021-08-25 ENCOUNTER — Ambulatory Visit: Payer: 59 | Admitting: Cardiology

## 2021-08-25 DIAGNOSIS — I442 Atrioventricular block, complete: Secondary | ICD-10-CM | POA: Diagnosis not present

## 2021-08-25 NOTE — Progress Notes (Signed)
Electrophysiology Office Note   Date:  08/25/2021   ID:  David [name], DOB [birth year], MRN [id number]  PCP:  Denita Lung, MD  Cardiologist:  [place] Primary Electrophysiologist:  Constance Haw, MD    Chief Complaint: heart block   History of Present Illness: David [name] is a 66 y.o. male who is being seen today for the evaluation of heart block at the request of Denita Lung, MD. Presenting today for electrophysiology evaluation.  He has a history significant for atrial fibrillation, super morbid obesity, obstructive sleep apnea, hypertension, CVA, severe aortic stenosis.  He is status post TAVR.  Post TAVR, he developed complete heart block.  He had a Medtronic dual-chamber pacemaker implanted 04/19/21  Today, he denies symptoms of palpitations, chest pain, orthopnea, PND, lower extremity edema, claudication, dizziness, presyncope, syncope, bleeding, or neurologic sequela. The patient is tolerating medications without difficulties.  Since his valve surgery he has overall done well.  He does have baseline shortness of breath.  He feels that his shortness of breath may be due to his atrial fibrillation.  That being said, shortness of breath is much improved since his aortic valve operation.   Past Medical History:  Diagnosis Date   Allergy    RHINITIS   Arthritis    thumb   Complication of anesthesia    2012 shoulder surgery: patient unsure of what the problem was    GERD (gastroesophageal reflux disease)    "not really"   History of echocardiogram 03/29/2009   EF 50-55%; mild concentric LVH;    Hx of adenomatous colonic polyps    Hyperlipidemia    Hypertension    takes Diovan and Toprol   Obesity    PAF (paroxysmal atrial fibrillation) (HCC)    associated with OSA   Right bundle branch block    Rosacea    S/P placement of cardiac pacemaker 04/19/2021   Successful implantation of a Medtronic Azure XT DR MRI SureScan dual-chamber pacemaker for complete AV  block   S/P TAVR (transcatheter aortic valve replacement) 04/18/2021   s/p TAVR with a 29 mm Edwards S3 via the TF approach by Dr. Angelena Form and Dr. Cyndia Bent   Shortness of breath    With activity   Sleep apnea    BiPap   Smoker    Stroke ([place]) 06/2016   "mini stroke"   Past Surgical History:  Procedure Laterality Date   COLONOSCOPY WITH PROPOFOL N/A 11/25/2020   Procedure: COLONOSCOPY WITH PROPOFOL;  Surgeon: Carol Ada, MD;  Location: [facility];  Service: Endoscopy;  Laterality: N/A;   FINGER SURGERY     Reconstruction of Left thumb and L index finger.  Reconstruction    HEMOSTASIS CLIP PLACEMENT  11/25/2020   Procedure: HEMOSTASIS CLIP PLACEMENT;  Surgeon: Carol Ada, MD;  Location: [facility];  Service: Endoscopy;;   KNEE ARTHROSCOPY     MULTIPLE EXTRACTIONS WITH ALVEOLOPLASTY N/A 04/11/2021   Procedure: MULTIPLE EXTRACTION WITH ALVEOLOPLASTY;  Surgeon: Charlaine [place], DMD;  Location: [facility];  Service: Dentistry;  Laterality: N/A;   PACEMAKER IMPLANT N/A 04/19/2021   Procedure: PACEMAKER IMPLANT;  Surgeon: Constance Haw, MD;  Location: [facility];  Service: Cardiovascular;  Laterality: N/A;   POLYPECTOMY  11/25/2020   Procedure: POLYPECTOMY;  Surgeon: Carol Ada, MD;  [facility];  Service: Endoscopy;;   RIGHT/LEFT HEART CATH AND CORONARY ANGIOGRAPHY N/A 03/23/2021   Procedure: RIGHT/LEFT HEART CATH AND CORONARY ANGIOGRAPHY;  Surgeon: Burnell Blanks, MD;  Location: [facility];  Service: Cardiovascular;  Laterality: N/A;   SHOULDER CLOSED REDUCTION     Procedure: CLOSED REDUCTION SHOULDER;  Surgeon: Lawrence Santiago [facility];  Location: [place];  Service: Orthopedics;  Laterality: Right;   SHOULDER OPEN ROTATOR CUFF REPAIR  08/11/2011   Procedure: ROTATOR CUFF REPAIR SHOULDER OPEN;  Surgeon: Willa Frater III;  Location: [facility];  Service: Orthopedics;  Laterality: Right;   SUBMUCOSAL LIFTING INJECTION  11/25/2020   Procedure: SUBMUCOSAL  LIFTING INJECTION;  Surgeon: Carol Ada, MD;  [facility];  Service: Endoscopy;;   SUBMUCOSAL TATTOO INJECTION  11/25/2020   Procedure: SUBMUCOSAL TATTOO INJECTION;  Surgeon: Carol Ada, MD;  [facility];  Service: Endoscopy;;   TEE WITHOUT CARDIOVERSION N/A 04/18/2021   Procedure: TRANSESOPHAGEAL ECHOCARDIOGRAM (TEE);  Surgeon: Burnell Blanks, MD;  Location: [place] [facility];  Service: Open Heart Surgery;  Laterality: N/A;   TONSILLECTOMY     as a child   TRANSCATHETER AORTIC VALVE REPLACEMENT, TRANSFEMORAL N/A 04/18/2021   Procedure: TRANSCATHETER AORTIC VALVE REPLACEMENT, TRANSFEMORAL;  Surgeon: Burnell Blanks, MD;  Location: [place] [facility];  Service: Open Heart Surgery;  Laterality: N/A;     Current Outpatient Medications  Medication Sig Dispense Refill   ALPRAZolam (XANAX) 1 MG tablet Take 1 tablet (1 mg total) by mouth at bedtime as needed for sleep. 30 tablet 0   amoxicillin (AMOXIL) 500 MG tablet Take 4 tablets by mouth 60 minutes before any dental appointments 12 tablet 3   aspirin 81 MG chewable tablet Chew 1 tablet (81 mg total) by mouth daily. 90 tablet 1   atorvastatin (LIPITOR) 10 MG tablet TAKE 1 TABLET BY MOUTH EVERY DAY AT 6 PM 90 tablet 1   diltiazem (CARDIZEM CD) 120 MG 24 hr capsule TAKE 1 CAPSULE(120 MG) BY MOUTH DAILY 90 capsule 1   furosemide (LASIX) 80 MG tablet Take 1 tablet (80 mg total) by mouth daily. 90 tablet 3   hydrocortisone 2.5 % cream Apply to external ears as needed for itching. 30 g 5   methocarbamol (ROBAXIN) 500 MG tablet TAKE 1 TABLET(500 MG) BY MOUTH FOUR TIMES DAILY AS NEEDED FOR MUSCLE SPASMS 30 tablet 0   metoprolol succinate (TOPROL-XL) 50 MG 24 hr tablet TAKE 1 TABLET BY MOUTH WITH OR IMMEDIATLY FOLLOWING A MEAL 90 tablet 1   potassium chloride SA (KLOR-CON) 20 MEQ tablet Take 1 tablet (20 mEq total) by mouth daily. 90 tablet 3   valsartan-hydrochlorothiazide (DIOVAN-HCT) 160-25 MG tablet TAKE 1  TABLET BY MOUTH DAILY 90 tablet 0   XARELTO 20 MG TABS tablet TAKE 1 TABLET(20 MG) BY MOUTH DAILY WITH SUPPER 90 tablet 1   No current facility-administered medications for this visit.    Allergies:   Codeine and Morphine and related   Social History:  The patient  reports that he quit smoking about 5 years ago. His smoking use included cigarettes. He has a 40.00 pack-year smoking history. He has never used smokeless tobacco. He reports current alcohol use of about 10.0 standard drinks per week. He reports that he does not use drugs.   Family History:  The patient's family history includes Cancer in his paternal uncle; Cancer - Colon in his father; Cancer - Prostate in his maternal grandfather and paternal grandfather; Heart attack (age of onset: 54) in his father; Heart disease in his mother.    ROS:  Please see the history of present illness.   Otherwise, review of systems is positive  for none.   All other systems are reviewed and negative.    PHYSICAL EXAM: VS:  BP 100/68    Pulse (!) 113    Ht 6\' 3"  (1.905 m)    Wt (!) 438 lb 6.4 oz (198.9 kg)    SpO2 96%    BMI 54.80 kg/m  , BMI Body mass index is 54.8 kg/m. GEN: Well nourished, well developed, in no acute distress  HEENT: normal  Neck: no JVD, carotid bruits, or masses Cardiac: RRR; no murmurs, rubs, or gallops,no edema  Respiratory:  clear to auscultation bilaterally, normal work of breathing GI: soft, nontender, nondistended, + BS MS: no deformity or atrophy  Skin: warm and dry, device pocket is well healed Neuro:  Strength and sensation are intact Psych: euthymic mood, full affect  EKG:  EKG is ordered today. Personal review of the ekg ordered shows atrial fibrillation, ventricular paced  Device interrogation is reviewed today in detail.  See PaceArt for details.   Recent Labs: 04/14/2021: ALT 27 04/19/2021: Magnesium 2.1 04/27/2021: Hemoglobin 13.6; NT-Pro BNP 1,279; Platelets 240 05/17/2021: BUN 36; Creatinine, Ser  1.36; Potassium 4.0; Sodium 139    Lipid Panel     Component Value Date/Time   CHOL 127 09/15/2020 1629   TRIG 93 09/15/2020 1629   HDL 29 (L) 09/15/2020 1629   CHOLHDL 4.4 09/15/2020 1629   CHOLHDL 6.8 06/22/2016 0318   VLDL 22 06/22/2016 0318   LDLCALC 80 09/15/2020 1629     Wt Readings from Last 3 Encounters:  08/25/21 (!) 438 lb 6.4 oz (198.9 kg)  08/22/21 (!) 418 lb 3.2 oz (189.7 kg)  05/30/21 (!) 419 lb (190.1 kg)      Other studies Reviewed: Additional studies/ records that were reviewed today include: TTE 05/17/21  Review of the above records today demonstrates:   1. Compared to 04/19/21 no significant change; LV functon is normal; s/p  AVR with mean gradient 9 mmHg; previously described trivial AI not evident  on present study.   2. Left ventricular ejection fraction, by estimation, is 60 to 65%. The  left ventricle has normal function. The left ventricle has no regional  wall motion abnormalities. There is mild left ventricular hypertrophy.  Left ventricular diastolic parameters  are indeterminate.   3. Right ventricular systolic function is normal. The right ventricular  size is normal. There is normal pulmonary artery systolic pressure.   4. The mitral valve is normal in structure. Trivial mitral valve  regurgitation. No evidence of mitral stenosis.   5. The aortic valve has been repaired/replaced. Aortic valve  regurgitation is not visualized. No aortic stenosis is present. There is a  29 mm Ultra, stented (TAVR) valve present in the aortic position.  Procedure Date: 04/18/21.   6. The inferior vena cava is normal in size with greater than 50%  respiratory variability, suggesting right atrial pressure of 3 mmHg.    ASSESSMENT AND PLAN:  1.  Complete heart block: Status post Medtronic dual-chamber pacemaker implanted 04/19/2021.  Device functioning appropriately.  We Terea Neubauer continue with current management.  2.  Longstanding persistent atrial fibrillation: Has  been in atrial fibrillation since his device was implanted.  He is short of breath which is likely multifactorial, partly potentially due to his atrial fibrillation.  We did discuss rhythm control, though he has been in atrial fibrillation for at least 3 years.  He would like to lose some weight prior to that.  He would require either dofetilide or amiodarone.  Despite  these medications, I am concerned that with his morbid obesity and longstanding persistent atrial fibrillation that rhythm control Shakia Sebastiano be quite difficult.  3.  Morbid obesity: Body mass index is 54.8 kg/m.  We Clement Deneault start going to [facility] health weight loss program.    Current medicines are reviewed at length with the patient today.   The patient does not have concerns regarding his medicines.  The following changes were made today:  none  Labs/ tests ordered today include:  Orders Placed This Encounter  Procedures   EKG 12-Lead     Disposition:   FU with Jeriyah Granlund 6 months  Signed, Olanrewaju Osborn Meredith Leeds, MD  08/25/2021 3:46 PM     [address] [place] [address] [phone] (office) [phone] (fax)

## 2021-08-25 NOTE — Patient Instructions (Signed)
Medication Instructions:  Your physician recommends that you continue on your current medications as directed. Please refer to the Current Medication list given to you today.  *If you need a refill on your cardiac medications before your next appointment, please call your pharmacy*   Lab Work: None ordered   Testing/Procedures: None ordered   Follow-Up: At [facility], you and your health needs are our priority.  As part of our continuing mission to provide you with exceptional heart care, we have created designated Provider Care Teams.  These Care Teams include your primary Cardiologist (physician) and Advanced Practice Providers (APPs -  Physician Assistants and Nurse Practitioners) who all work together to provide you with the care you need, when you need it.  Your next appointment:   6 month(s)  The format for your next appointment:   In Person  Provider:   You will see one of the following Advanced Practice Providers on your designated Care Team:   Renee Ursuy, PA-C Michael "Andy" Tillery, PA-C   Thank you for choosing [facility]!!   Leomia Blake, RN [phone]         

## 2021-09-18 ENCOUNTER — Other Ambulatory Visit: Payer: Self-pay

## 2021-09-18 ENCOUNTER — Ambulatory Visit (INDEPENDENT_AMBULATORY_CARE_PROVIDER_SITE_OTHER): Payer: 59 | Admitting: Family Medicine

## 2021-09-18 ENCOUNTER — Encounter: Payer: Self-pay | Admitting: Family Medicine

## 2021-09-18 VITALS — BP 128/72 | HR 77 | Temp 98.3°F | Ht 73.75 in | Wt >= 6400 oz

## 2021-09-18 DIAGNOSIS — Z800 Family history of malignant neoplasm of digestive organs: Secondary | ICD-10-CM

## 2021-09-18 DIAGNOSIS — M62838 Other muscle spasm: Secondary | ICD-10-CM

## 2021-09-18 DIAGNOSIS — Z23 Encounter for immunization: Secondary | ICD-10-CM | POA: Diagnosis not present

## 2021-09-18 DIAGNOSIS — Z0000 Encounter for general adult medical examination without abnormal findings: Secondary | ICD-10-CM

## 2021-09-18 DIAGNOSIS — I480 Paroxysmal atrial fibrillation: Secondary | ICD-10-CM | POA: Diagnosis not present

## 2021-09-18 DIAGNOSIS — Z950 Presence of cardiac pacemaker: Secondary | ICD-10-CM

## 2021-09-18 DIAGNOSIS — G4733 Obstructive sleep apnea (adult) (pediatric): Secondary | ICD-10-CM

## 2021-09-18 DIAGNOSIS — Z8616 Personal history of COVID-19: Secondary | ICD-10-CM

## 2021-09-18 DIAGNOSIS — E785 Hyperlipidemia, unspecified: Secondary | ICD-10-CM

## 2021-09-18 DIAGNOSIS — Z860100 Personal history of colon polyps, unspecified: Secondary | ICD-10-CM

## 2021-09-18 DIAGNOSIS — Z952 Presence of prosthetic heart valve: Secondary | ICD-10-CM

## 2021-09-18 DIAGNOSIS — Z8673 Personal history of transient ischemic attack (TIA), and cerebral infarction without residual deficits: Secondary | ICD-10-CM

## 2021-09-18 DIAGNOSIS — M109 Gout, unspecified: Secondary | ICD-10-CM

## 2021-09-18 DIAGNOSIS — I719 Aortic aneurysm of unspecified site, without rupture: Secondary | ICD-10-CM

## 2021-09-18 DIAGNOSIS — Z87891 Personal history of nicotine dependence: Secondary | ICD-10-CM

## 2021-09-18 DIAGNOSIS — J309 Allergic rhinitis, unspecified: Secondary | ICD-10-CM

## 2021-09-18 DIAGNOSIS — Z860101 Personal history of adenomatous and serrated colon polyps: Secondary | ICD-10-CM | POA: Insufficient documentation

## 2021-09-18 DIAGNOSIS — I10 Essential (primary) hypertension: Secondary | ICD-10-CM

## 2021-09-18 DIAGNOSIS — M1712 Unilateral primary osteoarthritis, left knee: Secondary | ICD-10-CM

## 2021-09-18 LAB — LIPID PANEL

## 2021-09-18 MED ORDER — COLCHICINE 0.6 MG PO TABS: 0.6000 mg | ORAL_TABLET | Freq: Two times a day (BID) | ORAL | 0 refills | Status: DC

## 2021-09-18 NOTE — Progress Notes (Signed)
Subjective:    Patient ID: David [name], male    DOB: Apr 09, 1956, 66 y.o.   MRN: [id number]  HPI He is here for complete examination.  He had a TAVR done ended up having complete heart block and need for a pacer because of that.  He is followed regularly by cardiology because of this.  He also has an underlying history of AF and is on Xarelto.  He has had another outbreak of gout and this time in a different foot.  He would like a refill on his colchicine.  Cannot use an NSAID due to his being on Xarelto.  He has had a colonoscopy and multiple polyps were seen.  There is question about when he has to have another colonoscopy.  Recommend that he call Dr. Benson Norway concerning that.  Also review of a CT scan showed question of cirrhosis although his alcohol consumption is less than 1/day.  He does have OSA and is doing quite nicely on the CPAP.  Does have a previous history of COVID but needs follow-up shot.  Has had difficulty hearing from the left ear for the last day or so.  Continues have difficulty with left knee pain and eventual need for TKR but the present time he will need to hold off because of the underlying cardiac issues.  Does have a history of aortic aneurysm and is being followed by cardiology for that.  He does use a muscle relaxer periodically for the neck pain.  His allergies seem to be under good control.  He does have a previous history of CVA but has not had difficulty with that in quite some time.   Review of Systems  All other systems reviewed and are negative.     Objective:   Physical Exam Alert and in no distress. Tympanic membranes and canals are normal. Pharyngeal area is normal. Neck is supple without adenopathy or thyromegaly. Cardiac exam shows a regular sinus rhythm without murmurs or gallops. Lungs are clear to auscultation.        Assessment & Plan:  Routine general medical examination at a health care facility - Plan: CBC with Differential/Platelet, Comprehensive  metabolic panel, Lipid panel  OSA treated with BiPAP: A prescription was written for a new machine and he will get this at his convenience.   PAF (paroxysmal atrial fibrillation) ([place]):  Primary hypertension - Plan: CBC with Differential/Platelet, Comprehensive metabolic panel  Morbid obesity due to excess calories [facility]): At this time he is not interested in doing any major help with this.  Allergic rhinitis, unspecified seasonality, unspecified trigger  History of CVA (cerebrovascular accident)  S/P TAVR (transcatheter aortic valve replacement)  S/P placement of cardiac pacemaker  Family history of colon cancer in father  Arthritis of left knee: Discussed TKR with him and the fact that weight loss will make it much easier for him to have that procedure done.  Muscle spasms of neck: He will call when he needs another refill on his muscle relaxer.  Former smoker  Aortic aneurysm without rupture, unspecified portion of aorta ([place])  Need for Tdap vaccination - Plan: Tdap vaccine greater than or equal to 7yo IM  Need for COVID-19 vaccine - Plan: Pension scheme manager  History of COVID-19  Acute gout involving toe of left foot, unspecified cause - Plan: colchicine 0.6 MG tablet: I will treat him with colchicine but stated if he has another attack, I will need to place him on allopurinol  Hyperlipidemia, unspecified  hyperlipidemia type - Plan: Lipid panel

## 2021-09-19 LAB — CBC WITH DIFFERENTIAL/PLATELET
Basophils Absolute: 0.1 10*3/uL (ref 0.0–0.2)
Basos: 1 %
EOS (ABSOLUTE): 0.4 10*3/uL (ref 0.0–0.4)
Eos: 4 %
Hematocrit: 45.3 % (ref 37.5–51.0)
Hemoglobin: 15.4 g/dL (ref 13.0–17.7)
Immature Grans (Abs): 0 10*3/uL (ref 0.0–0.1)
Immature Granulocytes: 0 %
Lymphocytes Absolute: 2.5 10*3/uL (ref 0.7–3.1)
Lymphs: 30 %
MCH: 30.7 pg (ref 26.6–33.0)
MCHC: 34 g/dL (ref 31.5–35.7)
MCV: 90 fL (ref 79–97)
Monocytes Absolute: 0.6 10*3/uL (ref 0.1–0.9)
Monocytes: 7 %
Neutrophils Absolute: 4.9 10*3/uL (ref 1.4–7.0)
Neutrophils: 58 %
Platelets: 197 10*3/uL (ref 150–450)
RBC: 5.02 x10E6/uL (ref 4.14–5.80)
RDW: 13.5 % (ref 11.6–15.4)
WBC: 8.5 10*3/uL (ref 3.4–10.8)

## 2021-09-19 LAB — COMPREHENSIVE METABOLIC PANEL
ALT: 31 IU/L (ref 0–44)
AST: 24 IU/L (ref 0–40)
Albumin/Globulin Ratio: 1.3 (ref 1.2–2.2)
Albumin: 4.4 g/dL (ref 3.8–4.8)
Alkaline Phosphatase: 90 IU/L (ref 44–121)
BUN/Creatinine Ratio: 17 (ref 10–24)
BUN: 24 mg/dL (ref 8–27)
Bilirubin Total: 0.6 mg/dL (ref 0.0–1.2)
CO2: 21 mmol/L (ref 20–29)
Calcium: 9.4 mg/dL (ref 8.6–10.2)
Chloride: 105 mmol/L (ref 96–106)
Creatinine, Ser: 1.4 mg/dL — ABNORMAL HIGH (ref 0.76–1.27)
Globulin, Total: 3.3 g/dL (ref 1.5–4.5)
Glucose: 118 mg/dL — ABNORMAL HIGH (ref 70–99)
Potassium: 4.4 mmol/L (ref 3.5–5.2)
Sodium: 141 mmol/L (ref 134–144)
Total Protein: 7.7 g/dL (ref 6.0–8.5)
eGFR: 56 mL/min/{1.73_m2} — ABNORMAL LOW (ref >59)

## 2021-09-19 LAB — LIPID PANEL
Chol/HDL Ratio: 5.7 ratio — ABNORMAL HIGH (ref 0.0–5.0)
Cholesterol, Total: 155 mg/dL (ref 100–199)
HDL: 27 mg/dL — ABNORMAL LOW (ref >39)
LDL Chol Calc (NIH): 89 mg/dL (ref 0–99)
Triglycerides: 229 mg/dL — ABNORMAL HIGH (ref 0–149)
VLDL Cholesterol Cal: 39 mg/dL (ref 5–40)

## 2021-10-06 ENCOUNTER — Other Ambulatory Visit: Payer: Self-pay | Admitting: Family Medicine

## 2021-10-06 DIAGNOSIS — I10 Essential (primary) hypertension: Secondary | ICD-10-CM

## 2021-10-11 LAB — SPECIMEN STATUS REPORT

## 2021-10-11 LAB — HGB A1C W/O EAG: Hgb A1c MFr Bld: 6 % — ABNORMAL HIGH (ref 4.8–5.6)

## 2021-10-18 ENCOUNTER — Ambulatory Visit (INDEPENDENT_AMBULATORY_CARE_PROVIDER_SITE_OTHER): Payer: 59

## 2021-10-18 DIAGNOSIS — I442 Atrioventricular block, complete: Secondary | ICD-10-CM

## 2021-10-18 LAB — CUP PACEART REMOTE DEVICE CHECK
Battery Remaining Longevity: 154 mo
Battery Voltage: 3.05 V
Brady Statistic RA Percent Paced: 0 %
Brady Statistic RV Percent Paced: 80.86 %
Date Time Interrogation Session: 20230301022231
Implantable Lead Implant Date: 20220831
Implantable Lead Implant Date: 20220831
Implantable Lead Location: 753859
Implantable Lead Location: 753860
Implantable Lead Model: 5076
Implantable Lead Model: 5076
Implantable Pulse Generator Implant Date: 20220831
Lead Channel Impedance Value: 361 Ohm
Lead Channel Impedance Value: 418 Ohm
Lead Channel Impedance Value: 513 Ohm
Lead Channel Impedance Value: 532 Ohm
Lead Channel Pacing Threshold Amplitude: 0.75 V
Lead Channel Pacing Threshold Pulse Width: 0.4 ms
Lead Channel Sensing Intrinsic Amplitude: 2.625 mV
Lead Channel Sensing Intrinsic Amplitude: 2.625 mV
Lead Channel Sensing Intrinsic Amplitude: 3.5 mV
Lead Channel Sensing Intrinsic Amplitude: 3.5 mV
Lead Channel Setting Pacing Amplitude: 2 V
Lead Channel Setting Pacing Amplitude: 2 V
Lead Channel Setting Pacing Pulse Width: 0.4 ms
Lead Channel Setting Sensing Sensitivity: 0.9 mV

## 2021-10-22 ENCOUNTER — Other Ambulatory Visit: Payer: Self-pay | Admitting: Family Medicine

## 2021-10-22 DIAGNOSIS — I480 Paroxysmal atrial fibrillation: Secondary | ICD-10-CM

## 2021-10-25 NOTE — Progress Notes (Signed)
Remote pacemaker transmission.   

## 2021-11-11 ENCOUNTER — Encounter: Payer: Self-pay | Admitting: Family Medicine

## 2021-11-11 ENCOUNTER — Other Ambulatory Visit: Payer: Self-pay | Admitting: Family Medicine

## 2021-11-11 DIAGNOSIS — M109 Gout, unspecified: Secondary | ICD-10-CM

## 2021-11-12 ENCOUNTER — Other Ambulatory Visit: Payer: Self-pay | Admitting: Family Medicine

## 2021-11-12 DIAGNOSIS — M109 Gout, unspecified: Secondary | ICD-10-CM

## 2021-11-13 ENCOUNTER — Other Ambulatory Visit: Payer: Self-pay | Admitting: Family Medicine

## 2021-11-13 ENCOUNTER — Telehealth: Payer: Self-pay

## 2021-11-13 DIAGNOSIS — M109 Gout, unspecified: Secondary | ICD-10-CM

## 2021-11-13 MED ORDER — COLCHICINE 0.6 MG PO TABS: 0.6000 mg | ORAL_TABLET | Freq: Two times a day (BID) | ORAL | 0 refills | Status: DC

## 2021-11-13 MED ORDER — ALLOPURINOL 100 MG PO TABS: 100.0000 mg | ORAL_TABLET | Freq: Every day | ORAL | 0 refills | Status: DC

## 2021-11-13 NOTE — Telephone Encounter (Signed)
Pt. Called stating he is having the worst gout he has ever had and wanted to know if you could refill his colchicine asap today to [facility] on [address]/ [address].  ?

## 2021-11-14 NOTE — Telephone Encounter (Signed)
Pt. Aware of new meds sent in and also scheduled a f/u virtual in 4 weeks.  ?

## 2021-11-28 ENCOUNTER — Telehealth: Payer: Self-pay | Admitting: Internal Medicine

## 2021-11-28 DIAGNOSIS — M109 Gout, unspecified: Secondary | ICD-10-CM

## 2021-11-28 MED ORDER — COLCHICINE 0.6 MG PO TABS: 0.6000 mg | ORAL_TABLET | Freq: Two times a day (BID) | ORAL | 0 refills | Status: DC

## 2021-11-28 NOTE — Telephone Encounter (Signed)
Pt called and wants to know if he needs a refill on colchinine. Pt states he is still having a really bad flare up and has not cleared up.  ?

## 2021-12-11 ENCOUNTER — Encounter: Payer: Self-pay | Admitting: Family Medicine

## 2021-12-11 ENCOUNTER — Telehealth: Payer: 59 | Admitting: Family Medicine

## 2021-12-11 ENCOUNTER — Other Ambulatory Visit: Payer: Self-pay | Admitting: Family Medicine

## 2021-12-11 DIAGNOSIS — M62838 Other muscle spasm: Secondary | ICD-10-CM

## 2021-12-11 DIAGNOSIS — M109 Gout, unspecified: Secondary | ICD-10-CM | POA: Diagnosis not present

## 2021-12-11 DIAGNOSIS — F419 Anxiety disorder, unspecified: Secondary | ICD-10-CM

## 2021-12-11 DIAGNOSIS — L309 Dermatitis, unspecified: Secondary | ICD-10-CM

## 2021-12-11 MED ORDER — ALLOPURINOL 100 MG PO TABS: ORAL_TABLET | ORAL | 0 refills | Status: DC

## 2021-12-11 NOTE — Progress Notes (Signed)
? ?  Subjective:  ? ? Patient ID: David [name], male    DOB: 12/02/1955, 66 y.o.   MRN: [id number] ? ?HPI ?Documentation for virtual audio and video telecommunications through [place] encounter: ?The patient was located at home. 2 patient identifiers used.  ?The provider was located in the office. ?The patient did consent to this visit and is aware of possible charges through their insurance for this visit. ?The other persons participating in this telemedicine service were none. ?Time spent on call was 5 minutes and in review of previous records >20 minutes total for counseling and coordination of care. ?This virtual service is not related to other E/M service within previous 7 days.  ?He is here for consult concerning his gout.  He has been on 100 mg of allopurinol and seems to be doing well on that.  Review of his record indicates that his kidney function has diminished slightly.  He is not taking any more colchicine at the present time. ? ?Review of Systems ? ?   ?Objective:  ? Physical Exam ?Alert and in no distress otherwise not examined ? ? ? ?   ?Assessment & Plan:  ?Acute gout involving toe of left foot, unspecified cause - Plan: allopurinol (ZYLOPRIM) 100 MG tablet ?He is to go to 200 mg/day for the next month and then come in for a recheck on his kidney function before going any higher on the allopurinol.  Also encouraged him to stay well-hydrated. ? ?

## 2021-12-11 NOTE — Telephone Encounter (Signed)
[facility] is requesting to fill pt robaxin, xanax, allopurinol and hydrocortisone. Please advise Kh ?

## 2021-12-12 ENCOUNTER — Other Ambulatory Visit: Payer: Self-pay

## 2021-12-12 ENCOUNTER — Telehealth: Payer: Self-pay

## 2021-12-12 ENCOUNTER — Other Ambulatory Visit: Payer: Self-pay | Admitting: Family Medicine

## 2021-12-12 DIAGNOSIS — M109 Gout, unspecified: Secondary | ICD-10-CM

## 2021-12-12 MED ORDER — ALLOPURINOL 100 MG PO TABS: ORAL_TABLET | ORAL | 0 refills | Status: DC

## 2021-12-12 NOTE — Telephone Encounter (Signed)
Script was resent and pt was advised [place] ?

## 2021-12-12 NOTE — Telephone Encounter (Signed)
Pt. Called stating that Dr. Redmond School sent in Allopurinol '100mg'$  2 tablets daily. They told him at [facility] they still did not have the prescription so they could not feel it for him. If you could fix that for him. It looks like they didn't receive it in the system may have printed.  ?

## 2021-12-18 ENCOUNTER — Other Ambulatory Visit: Payer: Self-pay | Admitting: Physician Assistant

## 2021-12-18 DIAGNOSIS — Z952 Presence of prosthetic heart valve: Secondary | ICD-10-CM

## 2022-01-01 ENCOUNTER — Other Ambulatory Visit: Payer: Self-pay | Admitting: Family Medicine

## 2022-01-01 DIAGNOSIS — I10 Essential (primary) hypertension: Secondary | ICD-10-CM

## 2022-01-02 ENCOUNTER — Other Ambulatory Visit: Payer: Self-pay | Admitting: Family Medicine

## 2022-01-02 DIAGNOSIS — M109 Gout, unspecified: Secondary | ICD-10-CM

## 2022-01-16 ENCOUNTER — Encounter: Payer: Self-pay | Admitting: Family Medicine

## 2022-01-16 ENCOUNTER — Ambulatory Visit (INDEPENDENT_AMBULATORY_CARE_PROVIDER_SITE_OTHER): Payer: 59 | Admitting: Family Medicine

## 2022-01-16 VITALS — BP 110/60 | HR 84 | Temp 98.4°F | Wt >= 6400 oz

## 2022-01-16 DIAGNOSIS — M109 Gout, unspecified: Secondary | ICD-10-CM | POA: Diagnosis not present

## 2022-01-16 DIAGNOSIS — M79605 Pain in left leg: Secondary | ICD-10-CM | POA: Diagnosis not present

## 2022-01-16 DIAGNOSIS — I890 Lymphedema, not elsewhere classified: Secondary | ICD-10-CM

## 2022-01-16 NOTE — Progress Notes (Signed)
   Subjective:    Patient ID: David [name], male    DOB: 01-17-1956, 66 y.o.   MRN: [id number]  HPI He is here for recheck on gout.  He did have a gout attack and was given colchicine to help with that and since he is on Xarelto, could not use an NSAID.  Presently he is taking 200 mg of allopurinol.  There was a question about his kidney function therefore today is to draw blood concerning that.  Presently he is having no gout pain but is having some left leg pain started on Sunday.  Prior to that he did have a 5-hour drive to visit his mother and then a 5-hour drive back.  No chest pain, shortness of breath.  He has a previous history of cellulitis to the lower extremity requiring use of an antibiotic for an extended period of time.    Review of Systems     Objective:   Physical Exam Alert and in no distress.  Left lower extremity is reddish-purple in nature, not hot but tender.  Questionable Homans' sign.       Assessment & Plan:  Acute gout involving toe of left foot, unspecified cause - Plan: Comprehensive metabolic panel, Uric Acid  Left leg pain - Plan: US Venous Img Lower Unilateral Left (DVT)  Lymphedema I will send him off for DVT below he is on Xarelto tube is sure that that is not an issue.  If it is negative, we will place him on an antibiotic as this could be a recurrence of his lymphedema with cellulitis  5/31 the uric acid is still elevated.  I will go to 300 mg of allopurinol.  01/18/22 The Doppler study came back negative.  He does need a refill on his colchicine.  The allopurinol was increased to 300 mg.  I also discussed placing him on Wegovy.  He is to check with his insurance company concerning that and set up an appointment if it is covered.

## 2022-01-17 ENCOUNTER — Ambulatory Visit (INDEPENDENT_AMBULATORY_CARE_PROVIDER_SITE_OTHER): Payer: 59

## 2022-01-17 DIAGNOSIS — I442 Atrioventricular block, complete: Secondary | ICD-10-CM

## 2022-01-17 LAB — COMPREHENSIVE METABOLIC PANEL
ALT: 44 IU/L (ref 0–44)
AST: 33 IU/L (ref 0–40)
Albumin/Globulin Ratio: 1.3 (ref 1.2–2.2)
Albumin: 4.3 g/dL (ref 3.8–4.8)
Alkaline Phosphatase: 99 IU/L (ref 44–121)
BUN/Creatinine Ratio: 15 (ref 10–24)
BUN: 19 mg/dL (ref 8–27)
Bilirubin Total: 1 mg/dL (ref 0.0–1.2)
CO2: 20 mmol/L (ref 20–29)
Calcium: 9.6 mg/dL (ref 8.6–10.2)
Chloride: 99 mmol/L (ref 96–106)
Creatinine, Ser: 1.27 mg/dL (ref 0.76–1.27)
Globulin, Total: 3.2 g/dL (ref 1.5–4.5)
Glucose: 130 mg/dL — ABNORMAL HIGH (ref 70–99)
Potassium: 4 mmol/L (ref 3.5–5.2)
Sodium: 136 mmol/L (ref 134–144)
Total Protein: 7.5 g/dL (ref 6.0–8.5)
eGFR: 63 mL/min/{1.73_m2} (ref >59)

## 2022-01-17 LAB — URIC ACID: Uric Acid: 8.6 mg/dL — ABNORMAL HIGH (ref 3.8–8.4)

## 2022-01-17 MED ORDER — ALLOPURINOL 300 MG PO TABS: 300.0000 mg | ORAL_TABLET | Freq: Every day | ORAL | 6 refills | Status: DC

## 2022-01-17 NOTE — Addendum Note (Signed)
Addended by: Denita Lung on: 01/17/2022 10:37 AM   Modules accepted: Orders

## 2022-01-18 ENCOUNTER — Encounter: Payer: Self-pay | Admitting: Family Medicine

## 2022-01-18 LAB — CUP PACEART REMOTE DEVICE CHECK
Battery Remaining Longevity: 148 mo
Battery Voltage: 3.02 V
Brady Statistic RA Percent Paced: 0 %
Brady Statistic RV Percent Paced: 78.51 %
Date Time Interrogation Session: 20230530202544
Implantable Lead Implant Date: 20220831
Implantable Lead Implant Date: 20220831
Implantable Lead Location: 753859
Implantable Lead Location: 753860
Implantable Lead Model: 5076
Implantable Lead Model: 5076
Implantable Pulse Generator Implant Date: 20220831
Lead Channel Impedance Value: 342 Ohm
Lead Channel Impedance Value: 361 Ohm
Lead Channel Impedance Value: 437 Ohm
Lead Channel Impedance Value: 513 Ohm
Lead Channel Pacing Threshold Amplitude: 0.75 V
Lead Channel Pacing Threshold Pulse Width: 0.4 ms
Lead Channel Sensing Intrinsic Amplitude: 1.75 mV
Lead Channel Sensing Intrinsic Amplitude: 1.75 mV
Lead Channel Sensing Intrinsic Amplitude: 3.25 mV
Lead Channel Sensing Intrinsic Amplitude: 3.25 mV
Lead Channel Setting Pacing Amplitude: 2 V
Lead Channel Setting Pacing Amplitude: 2 V
Lead Channel Setting Pacing Pulse Width: 0.4 ms
Lead Channel Setting Sensing Sensitivity: 0.9 mV

## 2022-01-18 MED ORDER — COLCHICINE 0.6 MG PO TABS: ORAL_TABLET | ORAL | 1 refills | Status: DC

## 2022-01-18 NOTE — Addendum Note (Signed)
Addended by: Denita Lung on: 01/18/2022 09:25 AM   Modules accepted: Orders

## 2022-01-19 ENCOUNTER — Telehealth: Payer: Self-pay | Admitting: Family Medicine

## 2022-01-19 NOTE — Telephone Encounter (Signed)
DVT Ultrasound

## 2022-01-22 ENCOUNTER — Other Ambulatory Visit: Payer: 59

## 2022-01-30 NOTE — Progress Notes (Signed)
Remote pacemaker transmission.   

## 2022-02-15 ENCOUNTER — Telehealth: Payer: Self-pay | Admitting: Family Medicine

## 2022-02-15 NOTE — Telephone Encounter (Signed)
Pt states he needs a new bipap machine and called [facility] and they advised his doctor has to provide a letter stating that he still needs one. He says you can send it to his e-mail if possible.

## 2022-02-16 NOTE — Telephone Encounter (Signed)
Pt called back and was advised that he need an appointment. Appointment was made for a virtual face to face concerning his bipap machine. Pt ip pressure settings are 25 and his Ep settings are 8. Pt is also on auto titration. Once done a new order is needed and will have to be faxed back to [facility] at [phone]. If we have any other questions we can call her at [phone].kh

## 2022-02-16 NOTE — Telephone Encounter (Signed)
Lvm for pt to call back with more info. David [name]

## 2022-02-19 ENCOUNTER — Encounter: Payer: Self-pay | Admitting: Family Medicine

## 2022-02-21 ENCOUNTER — Telehealth (INDEPENDENT_AMBULATORY_CARE_PROVIDER_SITE_OTHER): Payer: 59 | Admitting: Family Medicine

## 2022-02-21 ENCOUNTER — Encounter: Payer: Self-pay | Admitting: Family Medicine

## 2022-02-21 VITALS — Ht 74.0 in | Wt >= 6400 oz

## 2022-02-21 DIAGNOSIS — G4733 Obstructive sleep apnea (adult) (pediatric): Secondary | ICD-10-CM | POA: Diagnosis not present

## 2022-02-21 NOTE — Progress Notes (Signed)
   Subjective:    Patient ID: David [name], male    DOB: 10-31-1955, 66 y.o.   MRN: [id number]  HPI Documentation for virtual audio and video telecommunications through [place] encounter: The patient was located at home. 2 patient identifiers used.  The provider was located in the office. The patient did consent to this visit and is aware of possible charges through their insurance for this visit. The other persons participating in this telemedicine service were none. Time spent on call was 5 minutes and in review of previous records >15 minutes total for counseling and coordination of care. This virtual service is not related to other E/M service within previous 7 days.  He is here for consult concerning his OSA.  He has been on BiPAP for quite some time and its been over 6 years since his last machine.  He is interested in getting a new machine.  The BiPAP is set at IP 25 and EP 8 auto titrate.  Review of Systems     Objective:   Physical Exam Alert and is otherwise not examined       Assessment & Plan:  OSA treated with BiPAP

## 2022-03-23 ENCOUNTER — Ambulatory Visit (HOSPITAL_COMMUNITY): Payer: 59 | Attending: Cardiology

## 2022-03-23 ENCOUNTER — Ambulatory Visit: Payer: 59 | Admitting: Physician Assistant

## 2022-03-23 VITALS — BP 120/70 | HR 80 | Ht 72.0 in | Wt >= 6400 oz

## 2022-03-23 DIAGNOSIS — I10 Essential (primary) hypertension: Secondary | ICD-10-CM

## 2022-03-23 DIAGNOSIS — Z952 Presence of prosthetic heart valve: Secondary | ICD-10-CM | POA: Insufficient documentation

## 2022-03-23 DIAGNOSIS — Z950 Presence of cardiac pacemaker: Secondary | ICD-10-CM

## 2022-03-23 DIAGNOSIS — I4819 Other persistent atrial fibrillation: Secondary | ICD-10-CM

## 2022-03-23 DIAGNOSIS — I5032 Chronic diastolic (congestive) heart failure: Secondary | ICD-10-CM

## 2022-03-23 LAB — ECHOCARDIOGRAM COMPLETE
AR max vel: 2.18 cm2
AV Area VTI: 2.25 cm2
AV Area mean vel: 2.29 cm2
AV Mean grad: 12.8 mmHg
AV Peak grad: 23.1 mmHg
Ao pk vel: 2.4 m/s
Area-P 1/2: 1.75 cm2
MV VTI: 2.45 cm2
S' Lateral: 3.9 cm

## 2022-03-23 MED ORDER — PERFLUTREN LIPID MICROSPHERE
1.0000 mL | INTRAVENOUS | Status: AC | PRN
  Administered 2022-03-23: 1 mL via INTRAVENOUS

## 2022-03-23 NOTE — Progress Notes (Signed)
[facility]                                     Cardiology Office Note:    Date:  03/23/2022   ID:  David [name], DOB 05-18-1956, MRN [id number]  PCP:  Denita Lung, MD  [facility] Cardiologist:  [name], MD / Dr. Angelena Form & Dr. Cyndia Bent (TAVR) [facility] Electrophysiologist:  None   Referring MD: Denita Lung, MD   1 year s/p TAVR  History of Present Illness:    David [name] is a 66 y.o. male with a hx of of HTN, HLD, prior CVA, super morbid obesity (BMI 54), persistent atrial fibrillation on Xarelto, RBBB, sleep apnea, former tobacco abuse and severe aortic stenosis s/p TAVR (04/18/21) c/b CHB s/p PPM (04/19/21) who presents to clinic for follow up.   He has been maintained on Xarelto for his persistent atrial fibrillation. Dr. Debara Pickett has been following his aortic stenosis with periodic echocardiograms. Echo 03/02/2021 showed a trileaflet aortic valve with severe calcification and thickening and restricted leaflet mobility.  The mean gradient was 49 mmHg with a valve area by VTI of 0.67 cm consistent with severe aortic stenosis. Left ventricular ejection fraction was 60 to 65%. [facility] 03/23/21 showed no CAD and severe AS with a mean gradient of 41.3 mm hg and peak gradient of 62 mm hg. The patient had poor dentition and underwent dental extractions on 04/11/21.   He was evaluated by the multidisciplinary valve team and underwent successful TAVR with a 29 mm Edwards Sapien 3 Ultra THV via the TF approach on 04/18/21. He developed CHB after valve deployment and was pacer dependant. He underwent successful implantation of a Medtronic Azure XR DR MRI SureScan dual chamber pacemaker on 04/19/21 by Dr. Curt Bears. Post operative echo showed EF 60%, normally functioning TAVR with a mean gradient of 8 mmHg and trivial PVL. He was resumed home anticoagulation Xarelto with the addition of a baby aspirin x 6 months.  He had a difficult post op  recovery with a pocket hematoma for which he held Xarelto, acute on chronic CHF, possible epididymitis and gout. His pacer was reset to pace at a higher rate and this helped a bit.  1 month echo showed EF 65%, normally functioning TAVR with a mean gradient of 9 mmHg and no PVL.  Today he presents to clinic for follow up. He is doing better but still has chronic dyspnea. Dealing with a leg issues and might need a surgery on his leg. He has chronic fatigue. No chest pain. No syncope. No blood in stool or urine.    Past Medical History:  Diagnosis Date   Allergy    RHINITIS   Arthritis    thumb   Complication of anesthesia    2012 shoulder surgery: patient unsure of what the problem was    GERD (gastroesophageal reflux disease)    "not really"   History of echocardiogram 03/29/2009   EF 50-55%; mild concentric LVH;    Hx of adenomatous colonic polyps    Hyperlipidemia    Hypertension    takes Diovan and Toprol   Obesity    PAF (paroxysmal atrial fibrillation) (HCC)    associated with OSA   Right bundle branch block    Rosacea    S/P placement of cardiac pacemaker 04/19/2021   Successful  implantation of a Medtronic Azure XT DR MRI SureScan dual-chamber pacemaker for complete AV block   S/P TAVR (transcatheter aortic valve replacement) 04/18/2021   s/p TAVR with a 29 mm Edwards S3 via the TF approach by Dr. Angelena Form and Dr. Cyndia Bent   Shortness of breath    With activity   Sleep apnea    BiPap   Smoker    Stroke ([place]) 06/2016   "mini stroke"    Past Surgical History:  Procedure Laterality Date   COLONOSCOPY WITH PROPOFOL N/A 11/25/2020   Procedure: COLONOSCOPY WITH PROPOFOL;  Surgeon: Carol Ada, MD;  Location: [facility];  Service: Endoscopy;  Laterality: N/A;   FINGER SURGERY     Reconstruction of Left thumb and L index finger.  Reconstruction    HEMOSTASIS CLIP PLACEMENT  11/25/2020   Procedure: HEMOSTASIS CLIP PLACEMENT;  Surgeon: Carol Ada, MD;  Location: [facility];  Service: Endoscopy;;   KNEE ARTHROSCOPY     MULTIPLE EXTRACTIONS WITH ALVEOLOPLASTY N/A 04/11/2021   Procedure: MULTIPLE EXTRACTION WITH ALVEOLOPLASTY;  Surgeon: Charlaine Dalton, DMD;  Location: [place];  Service: Dentistry;  Laterality: N/A;   PACEMAKER IMPLANT N/A 04/19/2021   Procedure: PACEMAKER IMPLANT;  Surgeon: Constance Haw, MD;  Location: [place] [facility];  Service: Cardiovascular;  Laterality: N/A;   POLYPECTOMY  11/25/2020   Procedure: POLYPECTOMY;  Surgeon: Carol Ada, MD;  [facility]: [facility];  Service: Endoscopy;;   RIGHT/LEFT HEART CATH AND CORONARY ANGIOGRAPHY N/A 03/23/2021   Procedure: RIGHT/LEFT HEART CATH AND CORONARY ANGIOGRAPHY;  Surgeon: Burnell Blanks, MD;  Location: [place] [facility];  Service: Cardiovascular;  Laterality: N/A;   SHOULDER CLOSED REDUCTION     Procedure: CLOSED REDUCTION SHOULDER;  Surgeon: Lawrence Santiago [facility];  Location: [place];  Service: Orthopedics;  Laterality: Right;   SHOULDER OPEN ROTATOR CUFF REPAIR  08/11/2011   Procedure: ROTATOR CUFF REPAIR SHOULDER OPEN;  Surgeon: Willa Frater III;  Location: [place];  Service: Orthopedics;  Laterality: Right;   SUBMUCOSAL LIFTING INJECTION  11/25/2020   Procedure: SUBMUCOSAL LIFTING INJECTION;  Surgeon: Carol Ada, MD;  Location: [facility];  Service: Endoscopy;;   SUBMUCOSAL TATTOO INJECTION  11/25/2020   Procedure: SUBMUCOSAL TATTOO INJECTION;  Surgeon: Carol Ada, MD;  Location: [facility];  Service: Endoscopy;;   TEE WITHOUT CARDIOVERSION N/A 04/18/2021   Procedure: TRANSESOPHAGEAL ECHOCARDIOGRAM (TEE);  Surgeon: Burnell Blanks, MD;  Location: [place] [facility];  Service: Open Heart Surgery;  Laterality: N/A;   TONSILLECTOMY     as a child   TRANSCATHETER AORTIC VALVE REPLACEMENT, TRANSFEMORAL N/A 04/18/2021   Procedure: TRANSCATHETER AORTIC VALVE REPLACEMENT, TRANSFEMORAL;  Surgeon: Burnell Blanks, MD;  Location: [place] [facility];  Service:  Open Heart Surgery;  Laterality: N/A;    Current Medications: Current Meds  Medication Sig   allopurinol (ZYLOPRIM) 300 MG tablet Take 1 tablet (300 mg total) by mouth daily.   ALPRAZolam (XANAX) 1 MG tablet TAKE 1 TABLET(1 MG) BY MOUTH AT BEDTIME AS NEEDED FOR SLEEP   amoxicillin (AMOXIL) 500 MG tablet Take 4 tablets by mouth 60 minutes before any dental appointments   atorvastatin (LIPITOR) 10 MG tablet TAKE 1 TABLET BY MOUTH EVERY DAY AT 6 PM   colchicine 0.6 MG tablet TAKE 1 TABLET(0.6 MG) BY MOUTH  DAILY   diltiazem (CARDIZEM CD) 120 MG 24 hr capsule TAKE 1 CAPSULE(120 MG) BY MOUTH DAILY   furosemide (LASIX) 80 MG tablet Take 1 tablet (80 mg total) by mouth  daily. (Patient taking differently: Take 40 mg by mouth daily.)   hydrocortisone 2.5 % cream APPLY TO EXTERNAL EARS AS NEEDED FOR ITCHING AS DIRECTED   methocarbamol (ROBAXIN) 500 MG tablet TAKE 1 TABLET(500 MG) BY MOUTH FOUR TIMES DAILY AS NEEDED FOR MUSCLE SPASMS   metoprolol succinate (TOPROL-XL) 50 MG 24 hr tablet TAKE 1 TABLET BY MOUTH WITH OR IMMEDIATLY FOLLOWING A MEAL   potassium chloride SA (KLOR-CON) 20 MEQ tablet Take 1 tablet (20 mEq total) by mouth daily.   valsartan-hydrochlorothiazide (DIOVAN-HCT) 160-25 MG tablet TAKE 1 TABLET BY MOUTH DAILY   XARELTO 20 MG TABS tablet TAKE 1 TABLET(20 MG) BY MOUTH DAILY WITH SUPPER     Allergies:   Codeine and Morphine and related   Social History   Socioeconomic History   Marital status: Single    Spouse name: Not on file   Number of children: Not on file   Years of education: Not on file   Highest education level: Not on file  Occupational History   Not on file  Tobacco Use   Smoking status: Former    Packs/day: 1.00    Years: 40.00    Total pack years: 40.00    Types: Cigarettes    Quit date: 06/20/2016    Years since quitting: 5.7   Smokeless tobacco: Never  Vaping Use   Vaping Use: Never used  Substance and Sexual Activity   Alcohol use: Yes    Alcohol/week:  10.0 standard drinks of alcohol    Types: 2 Glasses of wine, 6 Cans of beer, 2 Shots of liquor per week   Drug use: No   Sexual activity: Not Currently  Other Topics Concern   Not on file  Social History Narrative   Not on file   Social Determinants of Health   Financial Resource Strain: Not on file  Food Insecurity: Not on file  Transportation Needs: Not on file  Physical Activity: Not on file  Stress: Not on file  Social Connections: Not on file     Family History: The patient's family history includes Cancer in his paternal uncle; Cancer - Colon in his father; Cancer - Prostate in his maternal grandfather and paternal grandfather; Heart attack (age of onset: 31) in his father; Heart disease in his mother. There is no history of Anesthesia problems or Stroke.  ROS:   Please see the history of present illness.    All other systems reviewed and are negative.  EKGs/Labs/Other Studies Reviewed:    The following studies were reviewed today:    TAVR OPERATIVE NOTE     Date of Procedure:                04/18/2021   Preoperative Diagnosis:      Severe Aortic Stenosis    Postoperative Diagnosis:    Same    Procedure:        Transcatheter Aortic Valve Replacement - Percutaneous Right Transfemoral Approach             Edwards Sapien 3  THV (size 29 mm, model # 9600TFX, serial # [id number])              Co-Surgeons:                        Gaye Pollack, MD and Lauree Chandler, MD     Anesthesiologist:                  Linna Hoff  Tobias Alexander, MD   Echocardiographer:              Sanda Klein, MD   Pre-operative Echo Findings: Severe aortic stenosis Normal left ventricular systolic function   Post-operative Echo Findings: trivial paravalvular leak Normal left ventricular systolic function   _____________     Echo 04/19/21:  IMPRESSIONS   1. Left ventricular ejection fraction, by estimation, is 60 to 65%. The  left ventricle has normal function. The left ventricle has no  regional  wall motion abnormalities. There is mild concentric left ventricular  hypertrophy. Left ventricular diastolic  function could not be evaluated.   2. Right ventricular systolic function is normal. The right ventricular  size is normal. There is mildly elevated pulmonary artery systolic  pressure.   3. Left atrial size was severely dilated.   4. The mitral valve is normal in structure. No evidence of mitral valve  regurgitation.   5. The aortic valve has been repaired/replaced. Aortic valve  regurgitation is trivial. There is a 29 mm Edwards Ultra, stented (TAVR)  valve present in the aortic position. Procedure Date: 04/18/21. Echo  findings are consistent with normal structure and  function of the aortic valve prosthesis. Echo findings are consistent with  trace perivalvular leak of the aortic prosthesis. Aortic valve mean  gradient measures 8.0 mmHg. Aortic valve Vmax measures 1.98 m/s. Aortic  valve acceleration time measures 92  msec.   6. The inferior vena cava is dilated in size with <50% respiratory  variability, suggesting right atrial pressure of 15 mmHg.   Comparison(s): Prior images reviewed side by side. The perivalvular leak  is barely visible on the current transthoracic study.    ___________________   04/19/21 PACEMAKER IMPLANT    Conclusion   SURGEON:  Will Meredith Leeds, MD     PREPROCEDURE DIAGNOSIS:  complete AV block    POSTPROCEDURE DIAGNOSIS:  complete AV block     PROCEDURES:   1. Pacemaker implantation.     INTRODUCTION: David [name] is a 66 y.o. male  with a history of aortic stenosis post TAVR with heart block post procedure.  No reversible causes have been identified.  The patient therefore presents today for pacemaker implantation.   CONCLUSIONS:   1. Successful implantation of a Medtronic Azure XT DR MRI SureScan dual-chamber pacemaker for complete AV block  2. No early apparent complications.     ___________________________  Echo  05/17/21 IMPRESSIONS  1. Compared to 04/19/21 no significant change; LV functon is normal; s/p  AVR with mean gradient 9 mmHg; previously described trivial AI not evident  on present study.   2. Left ventricular ejection fraction, by estimation, is 60 to 65%. The  left ventricle has normal function. The left ventricle has no regional  wall motion abnormalities. There is mild left ventricular hypertrophy.  Left ventricular diastolic parameters  are indeterminate.   3. Right ventricular systolic function is normal. The right ventricular  size is normal. There is normal pulmonary artery systolic pressure.   4. The mitral valve is normal in structure. Trivial mitral valve  regurgitation. No evidence of mitral stenosis.   5. The aortic valve has been repaired/replaced. Aortic valve  regurgitation is not visualized. No aortic stenosis is present. There is a  29 mm Ultra, stented (TAVR) valve present in the aortic position.  Procedure Date: 04/18/21.   6. The inferior vena cava is normal in size with greater than 50%  respiratory variability, suggesting right atrial pressure of 3 mmHg.  Comparison(s): 04/19/21 EF 60-65%. AV 30mHg mean PG, 161mg peak PG.   ________________________  Echo 03/23/22 IMPRESSIONS   1. Difficult study due to poor echo windows.   2. Left ventricular ejection fraction, by estimation, is 60 to 65%. The  left ventricle has normal function. The left ventricle has no regional  wall motion abnormalities. Diastolic function indeterminant due to AFib.   3. Right ventricular systolic function is normal. The right ventricular  size is mildly enlarged.   4. Left atrial size was moderately dilated.   5. Right atrial size was mild to moderately dilated.   6. The mitral valve is grossly normal. Trivial mitral valve  regurgitation. Moderate to severe mitral annular calcification.   7. The aortic valve has been repaired/replaced. Aortic valve  regurgitation is not visualized.  There is a 29 mm Ultra, stented (TAVR)  valve present in the aortic position. Procedure Date: 04/18/21. Aortic  valve mean gradient measures 12.8 mmHg. Aortic  valve Vmax measures 2.40 m/s. DI 0.57, EOA 2.12cm2. No paravalvular leak  visualized.   8. Aortic dilatation noted. There is borderline dilatation of the aortic  root, measuring 36 mm. There is borderline dilatation of the ascending  aorta, measuring 39 mm.   Comparison(s): 05/17/21 EF 60-65%. AV 26m52m mean PG, 40m38mpeak PG. Mean  gradient slightly higher on current study at 12mm78mDI unchanged at 0.57  (previously 0.58).   EKG:  EKG is NOT ordered today.   Recent Labs: 04/19/2021: Magnesium 2.1 04/27/2021: NT-Pro BNP 1,279 09/18/2021: Hemoglobin 15.4; Platelets 197 01/16/2022: ALT 44; BUN 19; Creatinine, Ser 1.27; Potassium 4.0; Sodium 136  Recent Lipid Panel    Component Value Date/Time   CHOL 155 09/18/2021 1442   TRIG 229 (H) 09/18/2021 1442   HDL 27 (L) 09/18/2021 1442   CHOLHDL 5.7 (H) 09/18/2021 1442   CHOLHDL 6.8 06/22/2016 0318   VLDL 22 06/22/2016 0318   LDLCALC 89 09/18/2021 1442     Risk Assessment/Calculations:    CHA2DS2-VASc Score =     This indicates a  % annual risk of stroke. The patient's score is based upon:        Physical Exam:    VS:  BP 120/70   Pulse 80   Ht 6' (1.829 m)   Wt (!) 420 lb (190.5 kg)   SpO2 95%   BMI 56.96 kg/m     Wt Readings from Last 3 Encounters:  03/23/22 (!) 420 lb (190.5 kg)  02/21/22 (!) 402 lb (182.3 kg)  01/16/22 (!) 402 lb 3.2 oz (182.4 kg)    Today's Vitals   03/23/22 1052 03/23/22 1159  BP: (!) 148/80 120/70  Pulse: 80   SpO2: 95%   Weight: (!) 420 lb (190.5 kg)   Height: 6' (1.829 m)    Body mass index is 56.96 kg/m.  GEN:  Well nourished, well developed in no acute distress, morbidly obese HEENT: Normal NECK: No JVD; LYMPHATICS: No lymphadenopathy CARDIAC: RRR, distant heart sounds. No murmurs, rubs, gallops RESPIRATORY:  Clear to  auscultation without rales, wheezing or rhonchi  ABDOMEN: Soft, non-tender, non-distended MUSCULOSKELETAL:  No edema; No deformity  SKIN: Warm and dry.   NEUROLOGIC:  Alert and oriented x 3 PSYCHIATRIC:  Normal affect   ASSESSMENT:    1. S/P TAVR (transcatheter aortic valve replacement)   2. S/P placement of cardiac pacemaker   3. Essential hypertension   4. Persistent atrial fibrillation (HCC) David [name]. Morbid obesity (HCC) David [name]. Chronic diastolic CHF (  congestive heart failure) (HCC)      PLAN:    In order of problems listed above:  Severe AS s/p TAVR: echo today showed EF 60%, normally functioning TAVR with a mean gradient of 12.8 mmHg and no PVL. Continue Xarelto alone. He has NYHA class II symptoms, mostly related to body habitus. SBE prophylaxis discussed; he has amoxicillin. Continue regular follow up with Dr. Debara Pickett  CHB s/p PPM with pocket hematoma: this has resolved. Now back on Xarelto.    HTN: BP initially elevated but normal upon my personal recheck at the end of the visit. Continue current medications.    Persistent atrial fibrillation: continue Xarelto.    Morbid obesity: Body mass index is 56.96 kg/m. Working hard on changing his diet and exercising  Chronic diastolic CHF: appear euvolemic. Continue Lasix '80mg'$  daily.     Medication Adjustments/Labs and Tests Ordered: Current medicines are reviewed at length with the patient today.  Concerns regarding medicines are outlined above.  No orders of the defined types were placed in this encounter.    No orders of the defined types were placed in this encounter.     Patient Instructions  Medication Instructions:  Your physician recommends that you continue on your current medications as directed. Please refer to the Current Medication list given to you today.  *If you need a refill on your cardiac medications before your next appointment, please call your pharmacy*   Lab Work: None ordered   If you have labs  (blood work) drawn today and your tests are completely normal, you will receive your results only by: [place] (if you have MyChart) OR A paper copy in the mail If you have any lab test that is abnormal or we need to change your treatment, we will call you to review the results.   Testing/Procedures: None ordered    Follow-Up: At [facility], you and your health needs are our priority.  As part of our continuing mission to provide you with exceptional heart care, we have created designated Provider Care Teams.  These Care Teams include your primary Cardiologist (physician) and Advanced Practice Providers (APPs -  Physician Assistants and Nurse Practitioners) who all work together to provide you with the care you need, when you need it.  We recommend signing up for the patient portal called "MyChart".  Sign up information is provided on this After Visit Summary.  MyChart is used to connect with patients for Virtual Visits (Telemedicine).  Patients are able to view lab/test results, encounter notes, upcoming appointments, etc.  Non-urgent messages can be sent to your provider as well.   To learn more about what you can do with MyChart, go to [email or web address].    Your next appointment:   6-9 month(s)  The format for your next appointment:   In Person  Provider:   [name], MD     Other Instructions   Important Information About Sugar         Signed, Angelena Form, PA-C  03/23/2022 12:00 PM    David [name]

## 2022-03-23 NOTE — Patient Instructions (Signed)
Medication Instructions:  Your physician recommends that you continue on your current medications as directed. Please refer to the Current Medication list given to you today.  *If you need a refill on your cardiac medications before your next appointment, please call your pharmacy*   Lab Work: None ordered   If you have labs (blood work) drawn today and your tests are completely normal, you will receive your results only by: [place] (if you have MyChart) OR A paper copy in the mail If you have any lab test that is abnormal or we need to change your treatment, we will call you to review the results.   Testing/Procedures: None ordered    Follow-Up: At [facility], you and your health needs are our priority.  As part of our continuing mission to provide you with exceptional heart care, we have created designated Provider Care Teams.  These Care Teams include your primary Cardiologist (physician) and Advanced Practice Providers (APPs -  Physician Assistants and Nurse Practitioners) who all work together to provide you with the care you need, when you need it.  We recommend signing up for the patient portal called "MyChart".  Sign up information is provided on this After Visit Summary.  MyChart is used to connect with patients for Virtual Visits (Telemedicine).  Patients are able to view lab/test results, encounter notes, upcoming appointments, etc.  Non-urgent messages can be sent to your provider as well.   To learn more about what you can do with MyChart, go to [email or web address].    Your next appointment:   6-9 month(s)  The format for your next appointment:   In Person  Provider:   [name], MD     Other Instructions   Important Information About Sugar

## 2022-03-27 ENCOUNTER — Other Ambulatory Visit: Payer: Self-pay | Admitting: Family Medicine

## 2022-03-27 DIAGNOSIS — I10 Essential (primary) hypertension: Secondary | ICD-10-CM

## 2022-04-20 ENCOUNTER — Other Ambulatory Visit: Payer: Self-pay | Admitting: Family Medicine

## 2022-04-20 ENCOUNTER — Other Ambulatory Visit: Payer: Self-pay

## 2022-04-20 DIAGNOSIS — M62838 Other muscle spasm: Secondary | ICD-10-CM

## 2022-04-20 MED ORDER — POTASSIUM CHLORIDE CRYS ER 20 MEQ PO TBCR: 20.0000 meq | EXTENDED_RELEASE_TABLET | Freq: Every day | ORAL | 3 refills | Status: DC

## 2022-04-20 NOTE — Telephone Encounter (Signed)
Refill request for methocarbamol '500mg'$  last apt 12/11/21 next apt 09/19/22.

## 2022-04-24 ENCOUNTER — Ambulatory Visit (INDEPENDENT_AMBULATORY_CARE_PROVIDER_SITE_OTHER): Payer: 59

## 2022-04-24 DIAGNOSIS — I442 Atrioventricular block, complete: Secondary | ICD-10-CM | POA: Diagnosis not present

## 2022-04-25 ENCOUNTER — Encounter: Payer: Self-pay | Admitting: Internal Medicine

## 2022-04-27 LAB — CUP PACEART REMOTE DEVICE CHECK
Battery Remaining Longevity: 143 mo
Battery Voltage: 3.01 V
Brady Statistic RA Percent Paced: 0 %
Brady Statistic RV Percent Paced: 85.2 %
Date Time Interrogation Session: 20230905093918
Implantable Lead Implant Date: 20220831
Implantable Lead Implant Date: 20220831
Implantable Lead Location: 753859
Implantable Lead Location: 753860
Implantable Lead Model: 5076
Implantable Lead Model: 5076
Implantable Pulse Generator Implant Date: 20220831
Lead Channel Impedance Value: 342 Ohm
Lead Channel Impedance Value: 342 Ohm
Lead Channel Impedance Value: 418 Ohm
Lead Channel Impedance Value: 532 Ohm
Lead Channel Pacing Threshold Amplitude: 0.625 V
Lead Channel Pacing Threshold Pulse Width: 0.4 ms
Lead Channel Sensing Intrinsic Amplitude: 2.25 mV
Lead Channel Sensing Intrinsic Amplitude: 2.25 mV
Lead Channel Sensing Intrinsic Amplitude: 3.5 mV
Lead Channel Sensing Intrinsic Amplitude: 3.5 mV
Lead Channel Setting Pacing Amplitude: 2 V
Lead Channel Setting Pacing Amplitude: 2 V
Lead Channel Setting Pacing Pulse Width: 0.4 ms
Lead Channel Setting Sensing Sensitivity: 0.9 mV

## 2022-05-05 ENCOUNTER — Other Ambulatory Visit: Payer: Self-pay | Admitting: Family Medicine

## 2022-05-05 DIAGNOSIS — I480 Paroxysmal atrial fibrillation: Secondary | ICD-10-CM

## 2022-05-14 NOTE — Progress Notes (Signed)
Remote pacemaker transmission.   

## 2022-05-23 ENCOUNTER — Other Ambulatory Visit: Payer: Self-pay | Admitting: Family Medicine

## 2022-05-23 DIAGNOSIS — I10 Essential (primary) hypertension: Secondary | ICD-10-CM

## 2022-05-23 DIAGNOSIS — I480 Paroxysmal atrial fibrillation: Secondary | ICD-10-CM

## 2022-05-29 ENCOUNTER — Encounter: Payer: Self-pay | Admitting: Internal Medicine

## 2022-06-02 ENCOUNTER — Other Ambulatory Visit: Payer: Self-pay | Admitting: Family Medicine

## 2022-06-02 DIAGNOSIS — M62838 Other muscle spasm: Secondary | ICD-10-CM

## 2022-06-02 DIAGNOSIS — I10 Essential (primary) hypertension: Secondary | ICD-10-CM

## 2022-06-02 DIAGNOSIS — I480 Paroxysmal atrial fibrillation: Secondary | ICD-10-CM

## 2022-06-04 NOTE — Telephone Encounter (Signed)
Refill request last apt 02/21/22 next apt 09/19/22.

## 2022-06-11 ENCOUNTER — Encounter: Payer: Self-pay | Admitting: Internal Medicine

## 2022-06-27 ENCOUNTER — Other Ambulatory Visit: Payer: Self-pay | Admitting: Family Medicine

## 2022-06-27 DIAGNOSIS — I10 Essential (primary) hypertension: Secondary | ICD-10-CM

## 2022-07-15 ENCOUNTER — Other Ambulatory Visit: Payer: Self-pay | Admitting: Family Medicine

## 2022-07-15 DIAGNOSIS — M109 Gout, unspecified: Secondary | ICD-10-CM

## 2022-07-24 ENCOUNTER — Ambulatory Visit (INDEPENDENT_AMBULATORY_CARE_PROVIDER_SITE_OTHER): Payer: 59

## 2022-07-24 DIAGNOSIS — I442 Atrioventricular block, complete: Secondary | ICD-10-CM | POA: Diagnosis not present

## 2022-07-24 LAB — CUP PACEART REMOTE DEVICE CHECK
Battery Remaining Longevity: 140 mo
Battery Voltage: 3 V
Brady Statistic RA Percent Paced: 0 %
Brady Statistic RV Percent Paced: 80.92 %
Date Time Interrogation Session: 20231204202727
Implantable Lead Connection Status: 753985
Implantable Lead Connection Status: 753985
Implantable Lead Implant Date: 20220831
Implantable Lead Implant Date: 20220831
Implantable Lead Location: 753859
Implantable Lead Location: 753860
Implantable Lead Model: 5076
Implantable Lead Model: 5076
Implantable Pulse Generator Implant Date: 20220831
Lead Channel Impedance Value: 342 Ohm
Lead Channel Impedance Value: 361 Ohm
Lead Channel Impedance Value: 418 Ohm
Lead Channel Impedance Value: 532 Ohm
Lead Channel Pacing Threshold Amplitude: 0.625 V
Lead Channel Pacing Threshold Pulse Width: 0.4 ms
Lead Channel Sensing Intrinsic Amplitude: 2.5 mV
Lead Channel Sensing Intrinsic Amplitude: 2.5 mV
Lead Channel Sensing Intrinsic Amplitude: 2.875 mV
Lead Channel Sensing Intrinsic Amplitude: 2.875 mV
Lead Channel Setting Pacing Amplitude: 2 V
Lead Channel Setting Pacing Amplitude: 2 V
Lead Channel Setting Pacing Pulse Width: 0.4 ms
Lead Channel Setting Sensing Sensitivity: 0.9 mV
Zone Setting Status: 755011
Zone Setting Status: 755011

## 2022-08-03 ENCOUNTER — Other Ambulatory Visit: Payer: Self-pay | Admitting: Family Medicine

## 2022-08-03 DIAGNOSIS — M62838 Other muscle spasm: Secondary | ICD-10-CM

## 2022-08-07 ENCOUNTER — Other Ambulatory Visit: Payer: Self-pay | Admitting: Family Medicine

## 2022-08-07 DIAGNOSIS — M109 Gout, unspecified: Secondary | ICD-10-CM

## 2022-08-17 NOTE — Progress Notes (Signed)
Remote pacemaker transmission.   

## 2022-08-26 IMAGING — MR MR HEAD W/O CM
10 series · 42 of 48 positions shown · non-contrast
Comparison: 06/22/2016

CLINICAL DATA: Left lower extremity weakness, redness and swelling.

EXAM:
MRI HEAD WITHOUT CONTRAST
TECHNIQUE: Multiplanar, multiecho pulse sequences of the brain and surrounding
structures were obtained without intravenous contrast.

[Series 5: dwi_tracew · axial · 3.0mm · 0.92mm/px · z∈[-28,+125]mm · 8 of 106 slices shown]
[im 1/106]
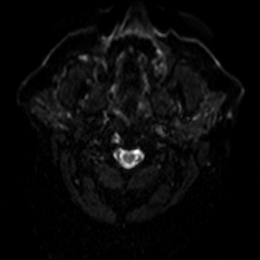
[im 20/106]
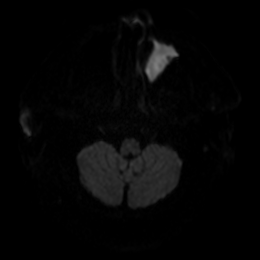
[im 29/106]
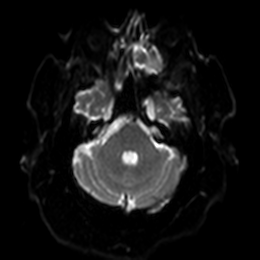
[im 48/106]
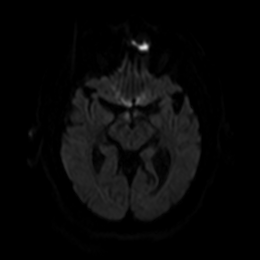
[im 58/106]
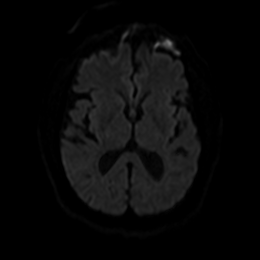
[im 77/106]
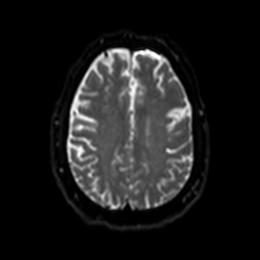
[im 86/106]
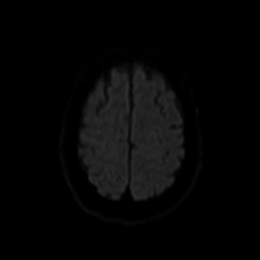
[im 106/106]
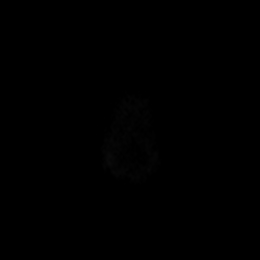

[Series 6: dwi_adc · axial · 3.0mm · 0.92mm/px · z∈[-28,+63]mm · 4 of 53 slices shown]
[im 1/53]
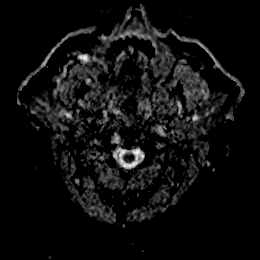
[im 11/53]
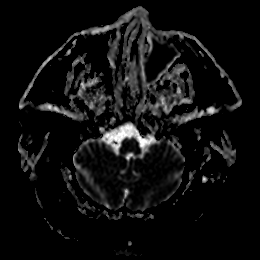
[im 21/53]
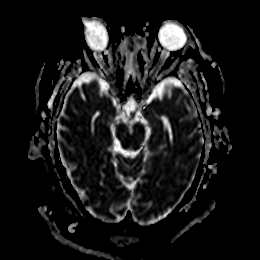
[im 32/53]
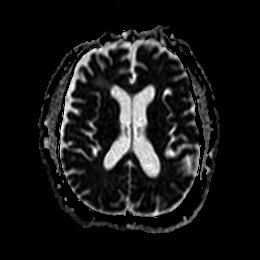

[Series 7: GRE · axial · 4.0mm · 0.45mm/px · z∈[+40,+178]mm · 4 of 40 slices shown]
[im 1/40]
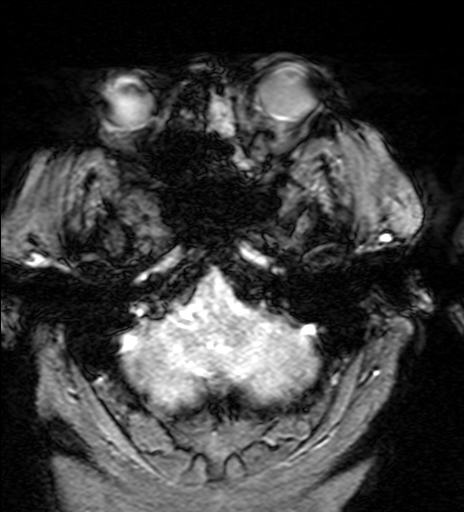
[im 14/40]
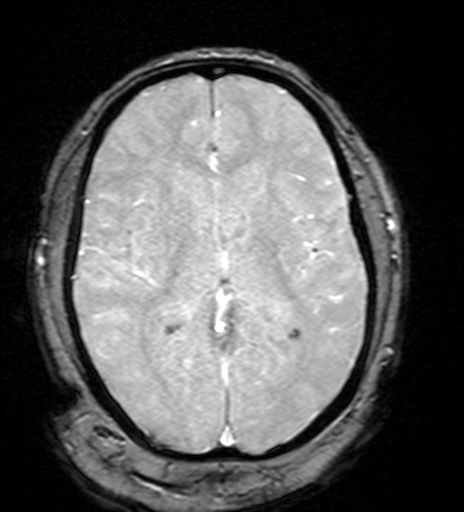
[im 27/40]
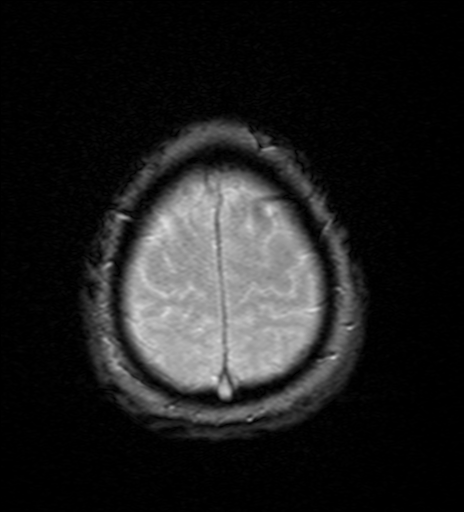
[im 40/40]
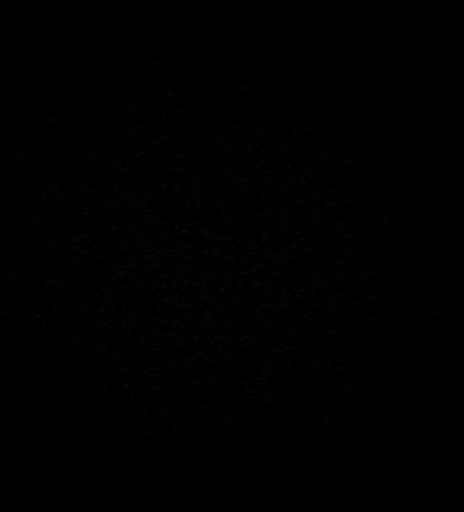

[Series 8: FLAIR · axial · 4.0mm · 0.90mm/px · z∈[-11,+134]mm · 4 of 39 slices shown]
[im 1/39]
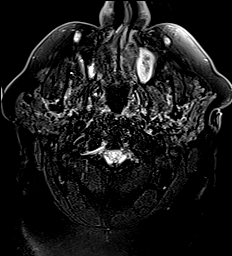
[im 13/39]
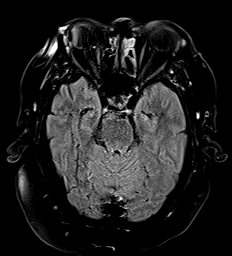
[im 26/39]
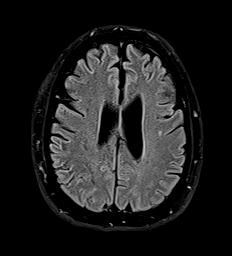
[im 39/39]
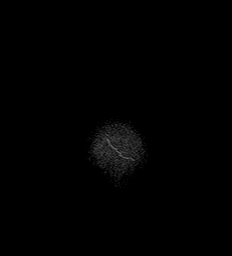

[Series 9: DWI · coronal · 5.0mm · 1.31mm/px · 6 of 60 slices shown (1 of 2)]
[im 1/60]
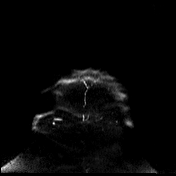
[im 12/60]
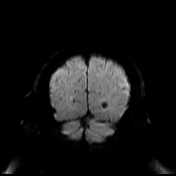
[im 24/60]
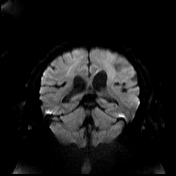
[im 36/60]
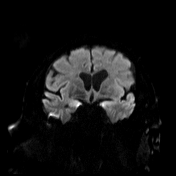
[im 48/60]
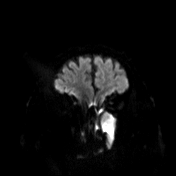
[im 60/60]
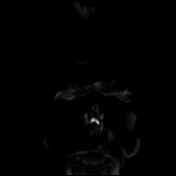

[Series 10: DWI · coronal · 5.0mm · 1.31mm/px · 3 of 30 slices shown (2 of 2)]
[im 1/30]
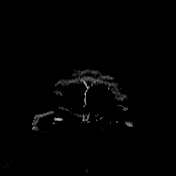
[im 15/30]
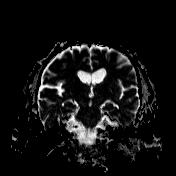
[im 30/30]
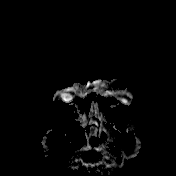

[Series 11: T2 · sagittal · 5.0mm · 0.47mm/px · 3 of 26 slices shown (1 of 3)]
[im 1/26]
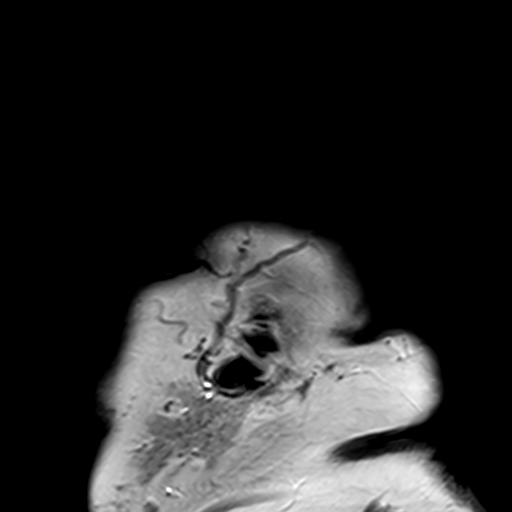
[im 13/26]
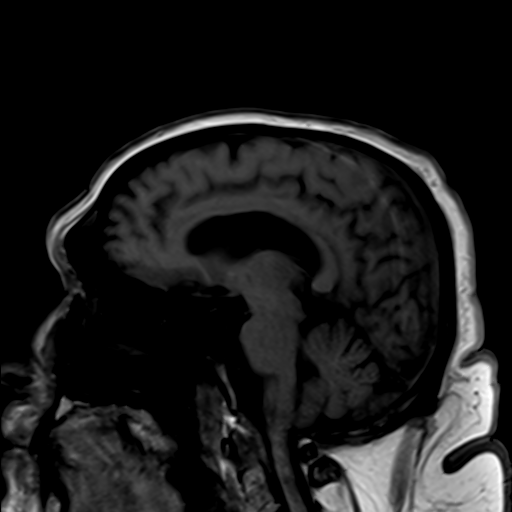
[im 26/26]
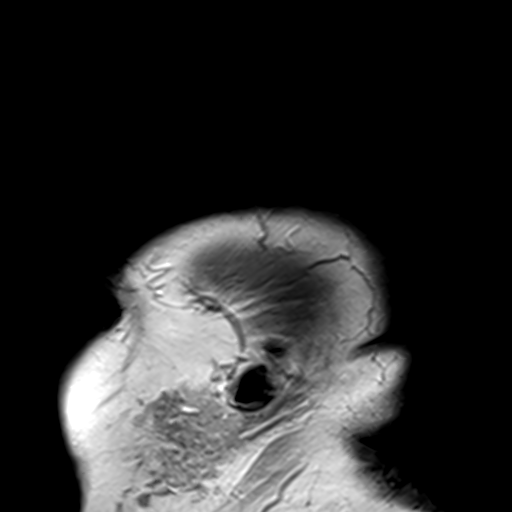

[Series 12: T2 · axial · 5.0mm · 0.45mm/px · z∈[-14,+135]mm · 3 of 25 slices shown (2 of 3)]
[im 1/25]
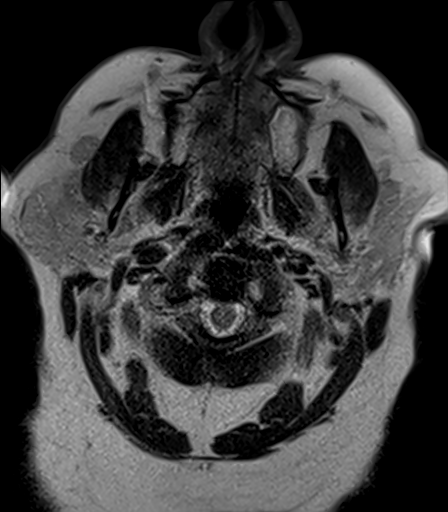
[im 13/25]
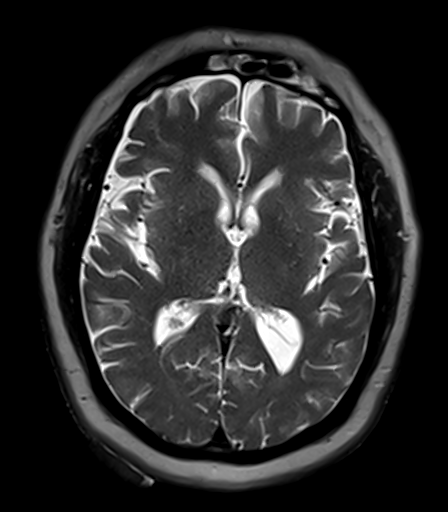
[im 25/25]
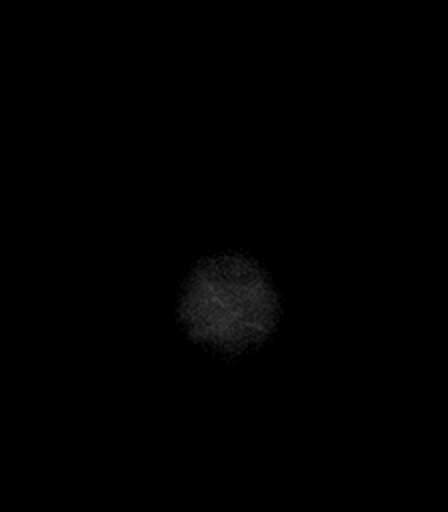

[Series 13: T1 · axial · 4.0mm · 0.45mm/px · z∈[-11,+134]mm · 4 of 39 slices shown]
[im 1/39]
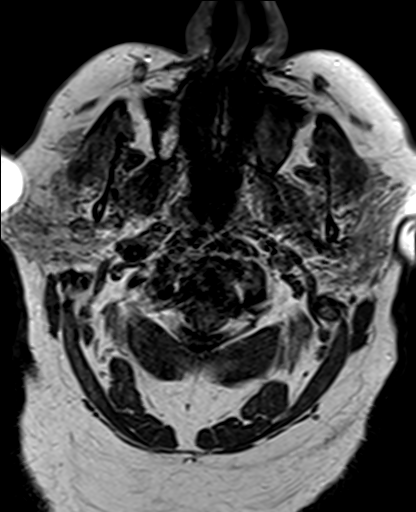
[im 13/39]
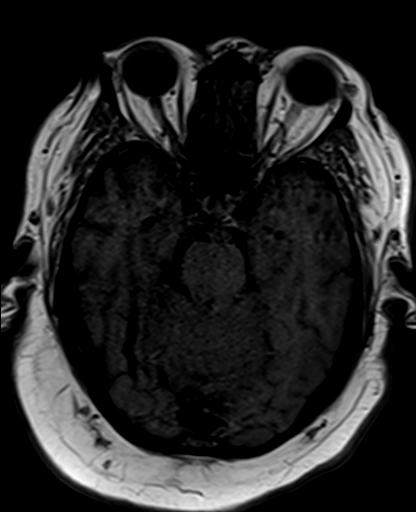
[im 26/39]
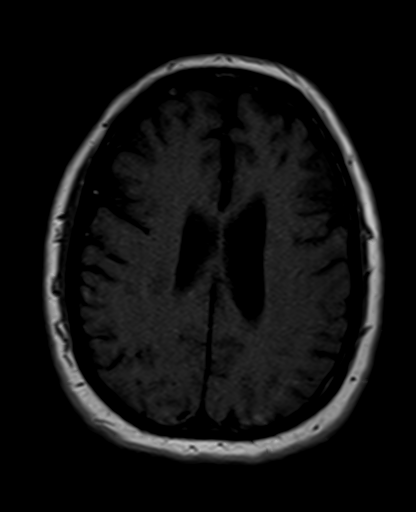
[im 39/39]
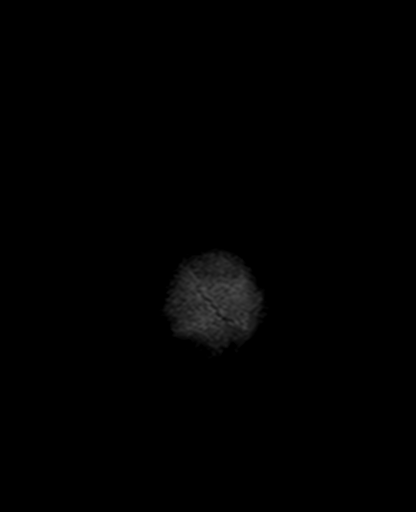

[Series 14: T2 · coronal · 5.0mm · 0.86mm/px · 3 of 29 slices shown (3 of 3)]
[im 1/29]
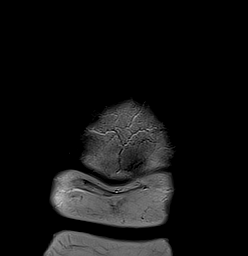
[im 15/29]
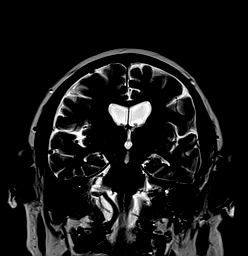
[im 29/29]
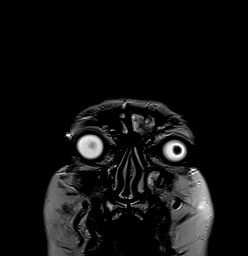

[42 of 48 positions shown; findings below may reference images not displayed]

FINDINGS: Brain: Diffusion imaging does not show any acute or subacute
infarction. No focal abnormality affects the brainstem or
cerebellum. Cerebral hemispheres show an old right frontal
subcortical white matter infarction and an old small infarction at
the left frontal operculum. No evidence of mass lesion, hemorrhage,
hydrocephalus or extra-axial collection.

Vascular: Major vessels at the base of the brain show flow.

Skull and upper cervical spine: Negative

Sinuses/Orbits: Left maxillary, ethmoid and frontal sinus
opacification and inflammatory change. Orbits negative.

Other: None
IMPRESSION: 1. No acute intracranial finding.
2. Old small vessel infarctions at the right frontal subcortical
white matter and left frontal operculum.
3. Left maxillary, ethmoid and frontal sinusitis.

## 2022-08-26 IMAGING — CT CT ANGIO HEAD
1 of 10 series · 6 of 35 positions shown · IV contrast (omnipaque)
Comparison: MRI same day

CLINICAL DATA: TIA. Left lower extremity weakness, redness and
swelling.

EXAM:
CT ANGIOGRAPHY HEAD AND NECK
TECHNIQUE: Multidetector CT imaging of the head and neck was performed using
the standard protocol during bolus administration of intravenous
contrast. Multiplanar CT image reconstructions and MIPs were
obtained to evaluate the vascular anatomy. Carotid stenosis
measurements (when applicable) are obtained utilizing NASCET
criteria, using the distal internal carotid diameter as the
denominator.
CONTRAST:  75mL OMNIPAQUE IOHEXOL 350 MG/ML SOLN

[Series 11: ax thin · axial · 0.47mm/px · z∈[-309,-35]mm · 6 of 385 slices shown]
[im 55/385  soft-tissue]
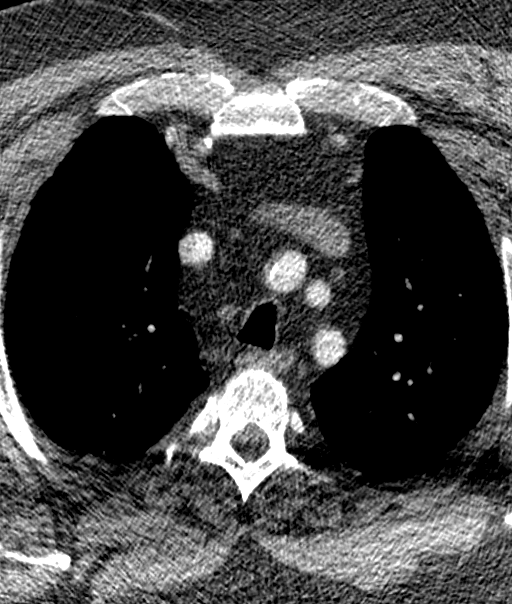
[im 110/385  bone]
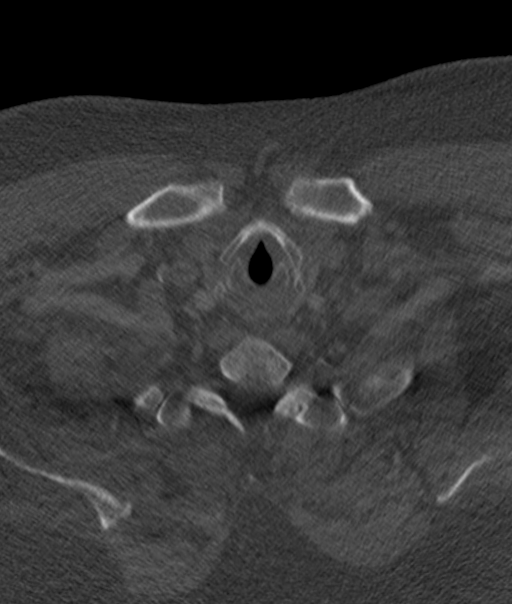
[im 165/385  soft-tissue]
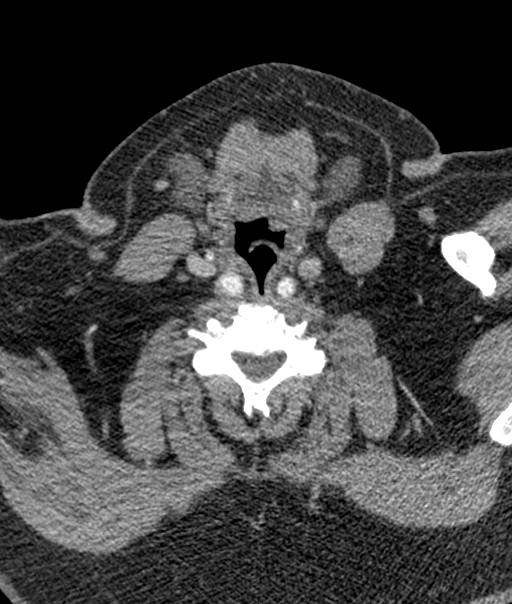
[im 220/385  bone]
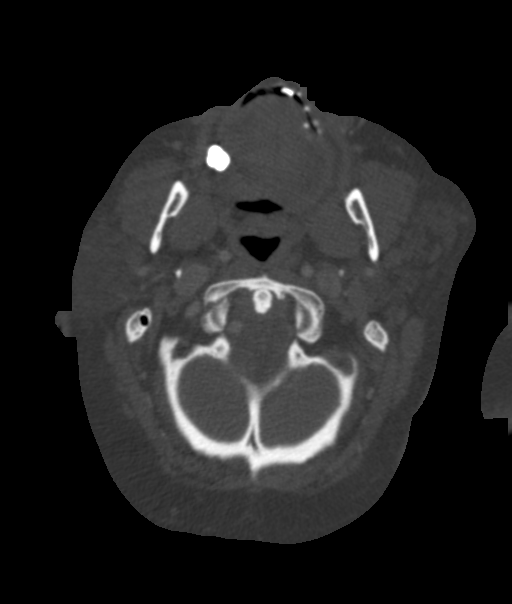
[im 275/385  soft-tissue]
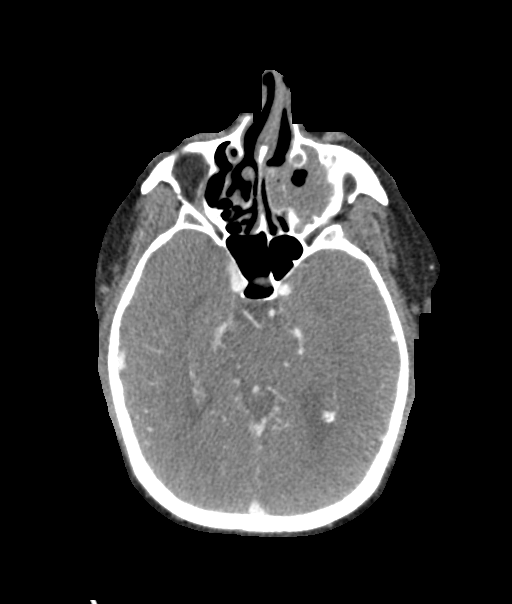
[im 330/385  bone]
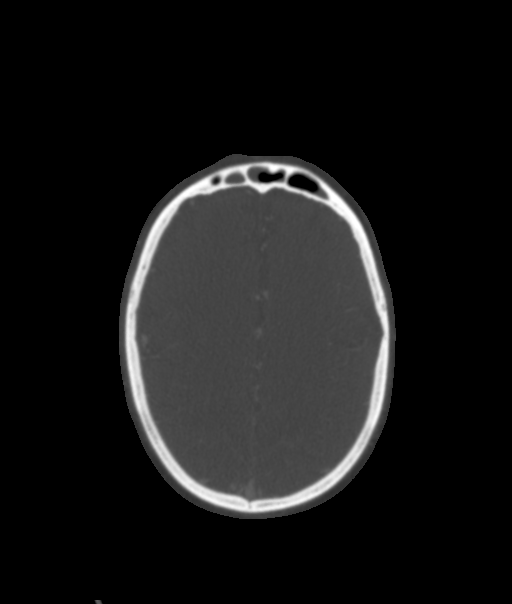

[6 of 35 positions shown; findings below may reference images not displayed]

FINDINGS: CT HEAD FINDINGS

Brain: No acute finding. Old white matter ischemic change in the
right frontal lobe. The remainder the brain appears normal by CT. No
mass, hemorrhage, hydrocephalus or extra-axial collection.

Vascular: No abnormal vascular finding.

Skull: Normal

Sinuses: Left maxillary, ethmoid and frontal inflammatory disease.

Orbits: Negative

Review of the MIP images confirms the above findings

CTA NECK FINDINGS

Aortic arch: Aortic atherosclerosis.  Branching pattern is normal.

Right carotid system: Common carotid artery widely patent to the
bifurcation. Calcified plaque at the carotid bifurcation and ICA
bulb but no stenosis. Cervical ICA widely patent.

Left carotid system: Common carotid artery widely patent to the
bifurcation. Minimal plaque at the carotid bifurcation without
stenosis. Cervical ICA widely patent.

Vertebral arteries: Right vertebral artery is dominant. Right
vertebral artery origin widely patent. Right vertebral artery widely
patent through the cervical region to the foramen magnum. Left
vertebral artery is very diminutive. Small amount of flow in the
tiny left vertebral artery.

Skeleton: Mid cervical spondylosis.  No acute bone finding.

Other neck: No neck mass or lymphadenopathy.

Upper chest: Emphysema and pulmonary scarring.

Review of the MIP images confirms the above findings

CTA HEAD FINDINGS

Anterior circulation: Both internal carotid arteries widely patent
through the skull base and siphon regions. Anterior and middle
cerebral vessels are patent. No large or medium vessel occlusion. No
correctable proximal stenosis.

Posterior circulation: Both vertebral arteries patent to the
basilar. The right is dominant. The left is a tiny vessel. Posterior
circulation branch vessels are normal. Patent posterior
communicating arteries on both sides.

Venous sinuses: Patent and normal.

Anatomic variants: None significant otherwise.

Review of the MIP images confirms the above findings
IMPRESSION: 1. Mild atherosclerotic change at both carotid bifurcations but no
stenosis.
2. No intracranial large or medium vessel occlusion or correctable
proximal stenosis.
3. Emphysema and aortic atherosclerosis.

Aortic Atherosclerosis (45VJV-MSJ.J) and Emphysema (45VJV-X7F.4).

## 2022-09-06 ENCOUNTER — Other Ambulatory Visit: Payer: Self-pay | Admitting: Family Medicine

## 2022-09-06 DIAGNOSIS — M109 Gout, unspecified: Secondary | ICD-10-CM

## 2022-09-06 DIAGNOSIS — I63412 Cerebral infarction due to embolism of left middle cerebral artery: Secondary | ICD-10-CM

## 2022-09-19 ENCOUNTER — Encounter: Payer: 59 | Admitting: Family Medicine

## 2022-09-25 ENCOUNTER — Other Ambulatory Visit: Payer: Self-pay | Admitting: Family Medicine

## 2022-09-25 DIAGNOSIS — I10 Essential (primary) hypertension: Secondary | ICD-10-CM

## 2022-09-25 DIAGNOSIS — M62838 Other muscle spasm: Secondary | ICD-10-CM

## 2022-09-25 NOTE — Telephone Encounter (Signed)
Please advise if ok to refill. 

## 2022-10-23 ENCOUNTER — Ambulatory Visit (INDEPENDENT_AMBULATORY_CARE_PROVIDER_SITE_OTHER): Payer: 59

## 2022-10-23 DIAGNOSIS — I442 Atrioventricular block, complete: Secondary | ICD-10-CM

## 2022-10-23 LAB — CUP PACEART REMOTE DEVICE CHECK
Battery Remaining Longevity: 137 mo
Battery Voltage: 3 V
Brady Statistic RA Percent Paced: 0 %
Brady Statistic RV Percent Paced: 80.91 %
Date Time Interrogation Session: 20240304212802
Implantable Lead Connection Status: 753985
Implantable Lead Connection Status: 753985
Implantable Lead Implant Date: 20220831
Implantable Lead Implant Date: 20220831
Implantable Lead Location: 753859
Implantable Lead Location: 753860
Implantable Lead Model: 5076
Implantable Lead Model: 5076
Implantable Pulse Generator Implant Date: 20220831
Lead Channel Impedance Value: 361 Ohm
Lead Channel Impedance Value: 361 Ohm
Lead Channel Impedance Value: 418 Ohm
Lead Channel Impedance Value: 532 Ohm
Lead Channel Pacing Threshold Amplitude: 0.625 V
Lead Channel Pacing Threshold Pulse Width: 0.4 ms
Lead Channel Sensing Intrinsic Amplitude: 2.25 mV
Lead Channel Sensing Intrinsic Amplitude: 2.25 mV
Lead Channel Sensing Intrinsic Amplitude: 4.375 mV
Lead Channel Sensing Intrinsic Amplitude: 4.375 mV
Lead Channel Setting Pacing Amplitude: 2 V
Lead Channel Setting Pacing Amplitude: 2 V
Lead Channel Setting Pacing Pulse Width: 0.4 ms
Lead Channel Setting Sensing Sensitivity: 0.9 mV
Zone Setting Status: 755011
Zone Setting Status: 755011

## 2022-11-04 ENCOUNTER — Other Ambulatory Visit: Payer: Self-pay | Admitting: Family Medicine

## 2022-11-04 DIAGNOSIS — I480 Paroxysmal atrial fibrillation: Secondary | ICD-10-CM

## 2022-11-20 ENCOUNTER — Other Ambulatory Visit: Payer: Self-pay | Admitting: Family Medicine

## 2022-11-20 DIAGNOSIS — I480 Paroxysmal atrial fibrillation: Secondary | ICD-10-CM

## 2022-11-20 DIAGNOSIS — I10 Essential (primary) hypertension: Secondary | ICD-10-CM

## 2022-11-20 DIAGNOSIS — M62838 Other muscle spasm: Secondary | ICD-10-CM

## 2022-11-28 NOTE — Progress Notes (Signed)
Remote pacemaker transmission.   

## 2022-11-30 ENCOUNTER — Other Ambulatory Visit: Payer: Self-pay | Admitting: Family Medicine

## 2022-11-30 DIAGNOSIS — M109 Gout, unspecified: Secondary | ICD-10-CM

## 2022-11-30 NOTE — Telephone Encounter (Signed)
Pt has an appt coming up so I refilled for only 30 days

## 2022-11-30 NOTE — Telephone Encounter (Signed)
Pt has an appt coming up soon. Will fill for 30 days

## 2022-12-10 DIAGNOSIS — M1A9XX0 Chronic gout, unspecified, without tophus (tophi): Secondary | ICD-10-CM | POA: Insufficient documentation

## 2022-12-10 DIAGNOSIS — K7469 Other cirrhosis of liver: Secondary | ICD-10-CM | POA: Insufficient documentation

## 2022-12-10 DIAGNOSIS — I700 Atherosclerosis of aorta: Secondary | ICD-10-CM | POA: Insufficient documentation

## 2022-12-11 ENCOUNTER — Encounter: Payer: Self-pay | Admitting: Family Medicine

## 2022-12-11 ENCOUNTER — Ambulatory Visit: Payer: 59 | Admitting: Family Medicine

## 2022-12-11 VITALS — BP 132/80 | HR 92 | Temp 97.5°F | Resp 20 | Ht 73.75 in | Wt >= 6400 oz

## 2022-12-11 DIAGNOSIS — I890 Lymphedema, not elsewhere classified: Secondary | ICD-10-CM

## 2022-12-11 DIAGNOSIS — K7469 Other cirrhosis of liver: Secondary | ICD-10-CM

## 2022-12-11 DIAGNOSIS — I480 Paroxysmal atrial fibrillation: Secondary | ICD-10-CM | POA: Diagnosis not present

## 2022-12-11 DIAGNOSIS — M109 Gout, unspecified: Secondary | ICD-10-CM | POA: Diagnosis not present

## 2022-12-11 DIAGNOSIS — I442 Atrioventricular block, complete: Secondary | ICD-10-CM

## 2022-12-11 DIAGNOSIS — Z952 Presence of prosthetic heart valve: Secondary | ICD-10-CM

## 2022-12-11 DIAGNOSIS — J309 Allergic rhinitis, unspecified: Secondary | ICD-10-CM

## 2022-12-11 DIAGNOSIS — Z86010 Personal history of colonic polyps: Secondary | ICD-10-CM

## 2022-12-11 DIAGNOSIS — J3489 Other specified disorders of nose and nasal sinuses: Secondary | ICD-10-CM

## 2022-12-11 DIAGNOSIS — I700 Atherosclerosis of aorta: Secondary | ICD-10-CM

## 2022-12-11 DIAGNOSIS — Z800 Family history of malignant neoplasm of digestive organs: Secondary | ICD-10-CM

## 2022-12-11 DIAGNOSIS — Z87891 Personal history of nicotine dependence: Secondary | ICD-10-CM

## 2022-12-11 DIAGNOSIS — I10 Essential (primary) hypertension: Secondary | ICD-10-CM

## 2022-12-11 DIAGNOSIS — Z0000 Encounter for general adult medical examination without abnormal findings: Secondary | ICD-10-CM | POA: Diagnosis not present

## 2022-12-11 DIAGNOSIS — Z23 Encounter for immunization: Secondary | ICD-10-CM

## 2022-12-11 DIAGNOSIS — F419 Anxiety disorder, unspecified: Secondary | ICD-10-CM

## 2022-12-11 DIAGNOSIS — M1A9XX0 Chronic gout, unspecified, without tophus (tophi): Secondary | ICD-10-CM

## 2022-12-11 DIAGNOSIS — M79671 Pain in right foot: Secondary | ICD-10-CM

## 2022-12-11 DIAGNOSIS — M79672 Pain in left foot: Secondary | ICD-10-CM

## 2022-12-11 DIAGNOSIS — M1712 Unilateral primary osteoarthritis, left knee: Secondary | ICD-10-CM

## 2022-12-11 DIAGNOSIS — Z950 Presence of cardiac pacemaker: Secondary | ICD-10-CM

## 2022-12-11 DIAGNOSIS — I63412 Cerebral infarction due to embolism of left middle cerebral artery: Secondary | ICD-10-CM | POA: Diagnosis not present

## 2022-12-11 DIAGNOSIS — E785 Hyperlipidemia, unspecified: Secondary | ICD-10-CM

## 2022-12-11 DIAGNOSIS — Z860101 Personal history of adenomatous and serrated colon polyps: Secondary | ICD-10-CM

## 2022-12-11 DIAGNOSIS — G4733 Obstructive sleep apnea (adult) (pediatric): Secondary | ICD-10-CM

## 2022-12-11 LAB — CBC WITH DIFFERENTIAL/PLATELET
Hematocrit: 48.9 % (ref 37.5–51.0)
Hemoglobin: 16.9 g/dL (ref 13.0–17.7)
Immature Grans (Abs): 0 10*3/uL (ref 0.0–0.1)
MCH: 32.8 pg (ref 26.6–33.0)
Monocytes Absolute: 0.6 10*3/uL (ref 0.1–0.9)
Platelets: 195 10*3/uL (ref 150–450)

## 2022-12-11 LAB — LIPID PANEL

## 2022-12-11 LAB — COMPREHENSIVE METABOLIC PANEL

## 2022-12-11 MED ORDER — ALLOPURINOL 300 MG PO TABS: ORAL_TABLET | ORAL | 0 refills | Status: DC

## 2022-12-11 MED ORDER — ATORVASTATIN CALCIUM 10 MG PO TABS: 10.0000 mg | ORAL_TABLET | Freq: Every day | ORAL | 3 refills | Status: DC

## 2022-12-11 MED ORDER — DILTIAZEM HCL ER COATED BEADS 120 MG PO CP24: ORAL_CAPSULE | ORAL | 3 refills | Status: DC

## 2022-12-11 MED ORDER — VALSARTAN-HYDROCHLOROTHIAZIDE 160-25 MG PO TABS
1.0000 | ORAL_TABLET | Freq: Every day | ORAL | 3 refills | Status: DC
Start: 2022-12-11 — End: 2023-12-15

## 2022-12-11 MED ORDER — METOPROLOL SUCCINATE ER 50 MG PO TB24: ORAL_TABLET | ORAL | 3 refills | Status: DC

## 2022-12-11 NOTE — Progress Notes (Signed)
Complete physical exam  Patient: David [name]   DOB: 24-Dec-1955   67 y.o. Male  MRN: [id number]  Subjective:    Chief Complaint  Patient presents with   Annual Exam    CPE. Fasting.     David [name] is a 67 y.o. male who presents today for a complete physical exam. He reports consuming a general diet. The patient does not participate in regular exercise at present. He generally feels fairly well. He reports sleeping fairly well. He does have underlying gout and has been using allopurinol regularly as well as colchicine on a regular basis.  I explained that he can now cut back on his colchicine and use it on an as-needed basis.  Does have a previous history of CVA but has not had any neurologic symptoms since then.  Continues on his blood pressure medication including diltiazem, metoprolol, valsartan/HCTZ.  He does have a history of PAF but also has a cardiac pacer and has had a TAVR.  He seems to be doing fairly well with this.  He states that he is taking a 5 mg dosing of the Lipitor because it seems to cause less aches and pains and a full 10 mg dosing.  His allergies seem to be under good control.  He does have a x-ray evidence of cirrhosis of the liver.  His drinking habits are very minimal.  He does have a colonic polyp and plans to follow-up with Dr. Elnoria Howard concerning repeat colonoscopy.  He continues to have difficulty with left knee pain and realizes the need for TKR but is having difficulty getting it figured out.  He has OSA and does use BiPAP.  He has had difficulty with his nose and the BiPAP mask and would like to be referred to dermatology to have this looked at in more detail.  He continues to have lymphedema symptoms and bilateral foot pain.  Psychologically he seems to be doing fairly well.  He admits to having weight related issues his entire life.  He hopes that him when he gets his knee done he will be able to be more physically active.  Otherwise his family and social history as well  as health maintenance and immunizations was reviewed.  Most recent fall risk assessment:    12/11/2022    2:05 PM  Fall Risk   Falls in the past year? 0  Number falls in past yr: 0  Injury with Fall? 0  Risk for fall due to : No Fall Risks  Follow up Falls evaluation completed     Most recent depression screenings:    12/11/2022    2:05 PM 09/18/2021    1:40 PM  PHQ 2/9 Scores  PHQ - 2 Score 0 0    Vision:Not within last year  and Dental: No current dental problems    Patient Care Team: Ronnald Nian, MD as PCP - General (Family Medicine) Chrystie Nose, MD as PCP - Cardiology (Cardiology)   Outpatient Medications Prior to Visit  Medication Sig Note   acetaminophen (TYLENOL) 325 MG tablet Take 650 mg by mouth every 6 (six) hours as needed.    ALPRAZolam (XANAX) 1 MG tablet TAKE 1 TABLET(1 MG) BY MOUTH AT BEDTIME AS NEEDED FOR SLEEP 12/11/2022: prn   amoxicillin (AMOXIL) 500 MG tablet Take 4 tablets by mouth 60 minutes before any dental appointments    colchicine 0.6 MG tablet TAKE 1 TABLET(0.6 MG) BY MOUTH DAILY    furosemide (LASIX) 80  MG tablet Take 1 tablet (80 mg total) by mouth daily. (Patient taking differently: Take 40 mg by mouth daily.)    hydrocortisone 2.5 % cream APPLY TO EXTERNAL EARS AS NEEDED FOR ITCHING AS DIRECTED    methocarbamol (ROBAXIN) 500 MG tablet TAKE 1 TABLET(500 MG) BY MOUTH FOUR TIMES DAILY AS NEEDED FOR MUSCLE SPASMS    potassium chloride SA (KLOR-CON M) 20 MEQ tablet Take 1 tablet (20 mEq total) by mouth daily.    rivaroxaban (XARELTO) 20 MG TABS tablet TAKE 1 TABLET(20 MG) BY MOUTH DAILY WITH SUPPER.    [DISCONTINUED] allopurinol (ZYLOPRIM) 300 MG tablet TAKE 1 TABLET(300 MG) BY MOUTH DAILY    [DISCONTINUED] atorvastatin (LIPITOR) 10 MG tablet TAKE 1 TABLET BY MOUTH EVERY DAY AT 6 PM    [DISCONTINUED] diltiazem (CARDIZEM CD) 120 MG 24 hr capsule TAKE 1 CAPSULE(120 MG) BY MOUTH DAILY    [DISCONTINUED] metoprolol succinate (TOPROL-XL) 50 MG 24  hr tablet TAKE 1 TABLET BY MOUTH WITH OR IMMEDIATELY FOLLOWING A MEAL    [DISCONTINUED] valsartan-hydrochlorothiazide (DIOVAN-HCT) 160-25 MG tablet TAKE 1 TABLET BY MOUTH DAILY    No facility-administered medications prior to visit.    Review of Systems  All other systems reviewed and are negative.         Objective:  .    BP 132/80   Pulse 92   Temp (!) 97.5 F (36.4 C) (Oral)   Resp 20   Ht 6' 1.75" (1.873 m)   Wt (!) 414 lb (187.8 kg)   SpO2 96% Comment: room air  BMI 53.52 kg/m    Physical Exam   Alert and in no distress. Tympanic membranes and canals are normal.  Exam of the nose does show slight hypertrophy.  Pharyngeal area is normal. Neck is supple without adenopathy or thyromegaly. Cardiac exam shows a regular sinus rhythm without murmurs or gallops. Lungs are clear to auscultation.  Foot exam not done at patient request     Assessment & Plan:    Routine general medical examination at a health care facility  Acute gout involving toe of left foot, unspecified cause  Morbid obesity due to excess calories - Plan: atorvastatin (LIPITOR) 10 MG tablet, CBC with Differential/Platelet, Comprehensive metabolic panel, Lipid panel  Cerebrovascular accident (CVA) due to embolism of left middle cerebral artery - Plan: atorvastatin (LIPITOR) 10 MG tablet  PAF (paroxysmal atrial fibrillation) - Plan: diltiazem (CARDIZEM CD) 120 MG 24 hr capsule, metoprolol succinate (TOPROL-XL) 50 MG 24 hr tablet  Essential hypertension - Plan: diltiazem (CARDIZEM CD) 120 MG 24 hr capsule, metoprolol succinate (TOPROL-XL) 50 MG 24 hr tablet, valsartan-hydrochlorothiazide (DIOVAN-HCT) 160-25 MG tablet, CBC with Differential/Platelet, Comprehensive metabolic panel  S/P TAVR (transcatheter aortic valve replacement)  OSA treated with BiPAP  Former smoker  Lymphedema  S/P placement of cardiac pacemaker  Aortic atherosclerosis - Plan: Lipid panel  Chronic gout without tophus,  unspecified cause, unspecified site - Plan: allopurinol (ZYLOPRIM) 300 MG tablet, Uric Acid  Anxiety  Family history of colon cancer in father  Other cirrhosis of liver  Heart block AV complete  Allergic rhinitis, unspecified seasonality, unspecified trigger  Arthritis of left knee  Hyperlipidemia, unspecified hyperlipidemia type  History of adenomatous polyp of colon  Need for COVID-19 vaccine - Plan: Pfizer Fall 2023 Covid-19 Vaccine 1yrs and older  Pain in both feet - Plan: Ambulatory referral to Podiatry  Nasal lesion - Plan: Ambulatory referral to Dermatology  Immunization History  Administered Date(s) Administered   COVID-19, mRNA, vaccine(Comirnaty)12  years and older 12/11/2022   Influenza,inj,Quad PF,6+ Mos 07/03/2013, 06/22/2016, 04/26/2020, 05/05/2021   PFIZER(Purple Top)SARS-COV-2 Vaccination 11/08/2019, 11/29/2019, 09/15/2020   Pfizer Covid-19 Vaccine Bivalent Booster 27yrs & up 09/18/2021   Pneumococcal Conjugate-13 02/13/2018   Pneumococcal Polysaccharide-23 06/22/2016   Td 07/06/2011   Tdap 09/18/2021   Zoster Recombinat (Shingrix) 02/24/2019, 06/29/2019    Health Maintenance  Topic Date Due   Lung Cancer Screening  05/19/2022   Pneumonia Vaccine 35+ Years old (3 of 3 - PPSV23 or PCV20) 02/16/2026 (Originally 12/20/2021)   INFLUENZA VACCINE  03/21/2023   COLONOSCOPY (Pts 45-70yrs Insurance coverage will need to be confirmed)  11/26/2030   DTaP/Tdap/Td (3 - Td or Tdap) 09/19/2031   COVID-19 Vaccine  Completed   Hepatitis C Screening  Completed   HIV Screening  Completed   Zoster Vaccines- Shingrix  Completed   HPV VACCINES  Aged Out   Fecal DNA (Cologuard)  Discontinued  He will follow-up with Dr. Elnoria Howard concerning his colonic polyp history.  He is also continue to be followed by cardiology.  Discussed the arthritis of the knee.  I will reevaluate his lipids after I have a chance to look at his blood work.  Did recommend he get RSV shot.  Otherwise  continue on present medications.  Okay Close a door please   Problem List Items Addressed This Visit     RESOLVED: Acute gout involving toe of left foot   Relevant Medications   allopurinol (ZYLOPRIM) 300 MG tablet   acetaminophen (TYLENOL) 325 MG tablet   Allergic rhinitis   Anxiety   Aortic atherosclerosis   Relevant Medications   atorvastatin (LIPITOR) 10 MG tablet   diltiazem (CARDIZEM CD) 120 MG 24 hr capsule   metoprolol succinate (TOPROL-XL) 50 MG 24 hr tablet   valsartan-hydrochlorothiazide (DIOVAN-HCT) 160-25 MG tablet   Other Relevant Orders   Lipid panel   Arthritis of left knee   Relevant Medications   allopurinol (ZYLOPRIM) 300 MG tablet   acetaminophen (TYLENOL) 325 MG tablet   Chronic gout without tophus   Relevant Medications   allopurinol (ZYLOPRIM) 300 MG tablet   Other Relevant Orders   Uric Acid   Family history of colon cancer in father   Former smoker   Heart block AV complete   Relevant Medications   atorvastatin (LIPITOR) 10 MG tablet   diltiazem (CARDIZEM CD) 120 MG 24 hr capsule   metoprolol succinate (TOPROL-XL) 50 MG 24 hr tablet   valsartan-hydrochlorothiazide (DIOVAN-HCT) 160-25 MG tablet   History of adenomatous polyp of colon   Hyperlipidemia   Relevant Medications   atorvastatin (LIPITOR) 10 MG tablet   diltiazem (CARDIZEM CD) 120 MG 24 hr capsule   metoprolol succinate (TOPROL-XL) 50 MG 24 hr tablet   valsartan-hydrochlorothiazide (DIOVAN-HCT) 160-25 MG tablet   Lymphedema   Morbid obesity due to excess calories   Relevant Medications   atorvastatin (LIPITOR) 10 MG tablet   Other Relevant Orders   CBC with Differential/Platelet   Comprehensive metabolic panel   Lipid panel   OSA treated with BiPAP   Other cirrhosis of liver   PAF (paroxysmal atrial fibrillation)   Relevant Medications   atorvastatin (LIPITOR) 10 MG tablet   diltiazem (CARDIZEM CD) 120 MG 24 hr capsule   metoprolol succinate (TOPROL-XL) 50 MG 24 hr tablet    valsartan-hydrochlorothiazide (DIOVAN-HCT) 160-25 MG tablet   S/P placement of cardiac pacemaker   S/P TAVR (transcatheter aortic valve replacement)   Other Visit Diagnoses     Routine  general medical examination at a health care facility    -  Primary   Cerebrovascular accident (CVA) due to embolism of left middle cerebral artery       Relevant Medications   atorvastatin (LIPITOR) 10 MG tablet   diltiazem (CARDIZEM CD) 120 MG 24 hr capsule   metoprolol succinate (TOPROL-XL) 50 MG 24 hr tablet   valsartan-hydrochlorothiazide (DIOVAN-HCT) 160-25 MG tablet   Essential hypertension       Relevant Medications   atorvastatin (LIPITOR) 10 MG tablet   diltiazem (CARDIZEM CD) 120 MG 24 hr capsule   metoprolol succinate (TOPROL-XL) 50 MG 24 hr tablet   valsartan-hydrochlorothiazide (DIOVAN-HCT) 160-25 MG tablet   Other Relevant Orders   CBC with Differential/Platelet   Comprehensive metabolic panel   Need for COVID-19 vaccine       Relevant Orders   Pfizer Fall 2023 Covid-19 Vaccine 86yrs and older (Completed)   Pain in both feet       Relevant Orders   Ambulatory referral to Podiatry   Nasal lesion       Relevant Orders   Ambulatory referral to Dermatology      Follow-up 1 year.    Sharlot Gowda, MD

## 2022-12-12 LAB — CBC WITH DIFFERENTIAL/PLATELET
Basophils Absolute: 0.1 10*3/uL (ref 0.0–0.2)
Basos: 1 %
EOS (ABSOLUTE): 0.2 10*3/uL (ref 0.0–0.4)
Eos: 3 %
Immature Granulocytes: 0 %
Lymphocytes Absolute: 2.8 10*3/uL (ref 0.7–3.1)
Lymphs: 37 %
MCHC: 34.6 g/dL (ref 31.5–35.7)
MCV: 95 fL (ref 79–97)
Monocytes: 7 %
Neutrophils Absolute: 4.1 10*3/uL (ref 1.4–7.0)
Neutrophils: 52 %
RBC: 5.15 x10E6/uL (ref 4.14–5.80)
RDW: 13.9 % (ref 11.6–15.4)
WBC: 7.8 10*3/uL (ref 3.4–10.8)

## 2022-12-12 LAB — LIPID PANEL
Chol/HDL Ratio: 5.4 ratio — ABNORMAL HIGH (ref 0.0–5.0)
HDL: 31 mg/dL — ABNORMAL LOW (ref >39)
LDL Chol Calc (NIH): 105 mg/dL — ABNORMAL HIGH (ref 0–99)
VLDL Cholesterol Cal: 31 mg/dL (ref 5–40)

## 2022-12-12 LAB — COMPREHENSIVE METABOLIC PANEL
AST: 23 IU/L (ref 0–40)
Albumin/Globulin Ratio: 1.5 (ref 1.2–2.2)
Albumin: 4.6 g/dL (ref 3.9–4.9)
Alkaline Phosphatase: 81 IU/L (ref 44–121)
BUN/Creatinine Ratio: 18 (ref 10–24)
BUN: 23 mg/dL (ref 8–27)
Calcium: 9.7 mg/dL (ref 8.6–10.2)
Chloride: 99 mmol/L (ref 96–106)
Creatinine, Ser: 1.3 mg/dL — ABNORMAL HIGH (ref 0.76–1.27)
Globulin, Total: 3 g/dL (ref 1.5–4.5)
Potassium: 4 mmol/L (ref 3.5–5.2)
Sodium: 138 mmol/L (ref 134–144)
Total Protein: 7.6 g/dL (ref 6.0–8.5)
eGFR: 61 mL/min/{1.73_m2} (ref >59)

## 2022-12-12 LAB — URIC ACID: Uric Acid: 7.7 mg/dL (ref 3.8–8.4)

## 2022-12-12 MED ORDER — ATORVASTATIN CALCIUM 20 MG PO TABS: 20.0000 mg | ORAL_TABLET | Freq: Every day | ORAL | 3 refills | Status: DC

## 2022-12-12 MED ORDER — ALLOPURINOL 100 MG PO TABS: 100.0000 mg | ORAL_TABLET | Freq: Every day | ORAL | 3 refills | Status: DC

## 2022-12-12 NOTE — Addendum Note (Signed)
Addended by: Ronnald Nian on: 12/12/2022 01:29 PM   Modules accepted: Orders

## 2022-12-12 NOTE — Addendum Note (Signed)
Addended by: Ronnald Nian on: 12/12/2022 01:26 PM   Modules accepted: Orders

## 2023-01-03 ENCOUNTER — Ambulatory Visit (INDEPENDENT_AMBULATORY_CARE_PROVIDER_SITE_OTHER): Payer: 59

## 2023-01-03 ENCOUNTER — Ambulatory Visit: Payer: 59 | Admitting: Podiatry

## 2023-01-03 DIAGNOSIS — M79674 Pain in right toe(s): Secondary | ICD-10-CM

## 2023-01-03 DIAGNOSIS — M79675 Pain in left toe(s): Secondary | ICD-10-CM

## 2023-01-03 DIAGNOSIS — M778 Other enthesopathies, not elsewhere classified: Secondary | ICD-10-CM

## 2023-01-03 DIAGNOSIS — B351 Tinea unguium: Secondary | ICD-10-CM

## 2023-01-03 DIAGNOSIS — G629 Polyneuropathy, unspecified: Secondary | ICD-10-CM | POA: Diagnosis not present

## 2023-01-03 MED ORDER — GABAPENTIN 400 MG PO CAPS: 400.0000 mg | ORAL_CAPSULE | Freq: Every day | ORAL | 3 refills | Status: AC

## 2023-01-03 NOTE — Progress Notes (Signed)
Subjective:   Patient ID: David [name], male   DOB: 67 y.o.   MRN: [id number]   HPI Patient presents stating good a lot of burning in both of his feet and he has nail issues of both feet that can become sore and it is hard for him to cut and he is not sure about fungus with yellow discoloration and he has a corn on the second toe left foot that becomes painful.  Has significant obesity does not smoke tries to be active   Review of Systems  All other systems reviewed and are negative.       Objective:  Physical Exam Vitals and nursing note reviewed.  Constitutional:      Appearance: He is well-developed.  Pulmonary:     Effort: Pulmonary effort is normal.  Musculoskeletal:        General: Normal range of motion.  Skin:    General: Skin is warm.  Neurological:     Mental Status: He is alert.     Neurovascular status was found to be intact muscle strength is adequate range of motion moderately reduced subtalar midtarsal joint.  Significant incurvation and thickness of the nailbeds 1-5 both feet that can become painful as they grow out and keratotic lesion second digit left distal painful when pressed along with multiple signs of diffuse neuropathy bilateral with no identifiable area wearing compression stocking left secondary to severe knee pain and venous issues     Assessment:  Poor health individual with severe obesity who has elongated nails 1-5 both feet incurvated and has a lesion second digit of the left foot painful when pressed along with multiple signs of probable idiopathic neuropathy which may be due to back issues or other issues associated with obesity     Plan:  H&P reviewed condition and at this point I am going to try him on gabapentin for the neuropathy and I debrided nailbeds 1-5 both feet taking out all the corners of the discomfort and I debrided lesion second digit left distal courtesy.  Patient will be seen back to recheck and we will start just taking 1  gabapentin at night 400 mg

## 2023-01-17 ENCOUNTER — Other Ambulatory Visit: Payer: Self-pay | Admitting: Family Medicine

## 2023-01-17 DIAGNOSIS — M62838 Other muscle spasm: Secondary | ICD-10-CM

## 2023-01-17 NOTE — Telephone Encounter (Signed)
Patient did request this refill and I did move hid CPE to [place] as it made with [facility] in error.

## 2023-01-17 NOTE — Telephone Encounter (Signed)
Left message to see if patient requested or just auto-refill. Also noticed he was scheduled for CPE next year with [facility]. Need to change to [facility] when patient calls back unless there was some reason for this change.

## 2023-01-22 ENCOUNTER — Ambulatory Visit (INDEPENDENT_AMBULATORY_CARE_PROVIDER_SITE_OTHER): Payer: 59

## 2023-01-22 DIAGNOSIS — I442 Atrioventricular block, complete: Secondary | ICD-10-CM | POA: Diagnosis not present

## 2023-01-22 LAB — CUP PACEART REMOTE DEVICE CHECK
Battery Remaining Longevity: 135 mo
Battery Voltage: 3 V
Brady Statistic RA Percent Paced: 0 %
Brady Statistic RV Percent Paced: 70.29 %
Date Time Interrogation Session: 20240603211335
Implantable Lead Connection Status: 753985
Implantable Lead Connection Status: 753985
Implantable Lead Implant Date: 20220831
Implantable Lead Implant Date: 20220831
Implantable Lead Location: 753859
Implantable Lead Location: 753860
Implantable Lead Model: 5076
Implantable Lead Model: 5076
Implantable Pulse Generator Implant Date: 20220831
Lead Channel Impedance Value: 361 Ohm
Lead Channel Impedance Value: 361 Ohm
Lead Channel Impedance Value: 418 Ohm
Lead Channel Impedance Value: 513 Ohm
Lead Channel Pacing Threshold Amplitude: 0.625 V
Lead Channel Pacing Threshold Pulse Width: 0.4 ms
Lead Channel Sensing Intrinsic Amplitude: 1.625 mV
Lead Channel Sensing Intrinsic Amplitude: 1.625 mV
Lead Channel Sensing Intrinsic Amplitude: 3.75 mV
Lead Channel Sensing Intrinsic Amplitude: 3.75 mV
Lead Channel Setting Pacing Amplitude: 2 V
Lead Channel Setting Pacing Amplitude: 2 V
Lead Channel Setting Pacing Pulse Width: 0.4 ms
Lead Channel Setting Sensing Sensitivity: 0.9 mV
Zone Setting Status: 755011
Zone Setting Status: 755011

## 2023-02-04 ENCOUNTER — Other Ambulatory Visit: Payer: Self-pay | Admitting: Family Medicine

## 2023-02-04 DIAGNOSIS — I480 Paroxysmal atrial fibrillation: Secondary | ICD-10-CM

## 2023-02-13 NOTE — Progress Notes (Signed)
Remote pacemaker transmission.   

## 2023-02-28 DIAGNOSIS — L719 Rosacea, unspecified: Secondary | ICD-10-CM | POA: Insufficient documentation

## 2023-03-21 ENCOUNTER — Other Ambulatory Visit: Payer: Self-pay | Admitting: Family Medicine

## 2023-03-21 DIAGNOSIS — M62838 Other muscle spasm: Secondary | ICD-10-CM

## 2023-03-21 NOTE — Telephone Encounter (Signed)
Refill request last apt 12/11/22.

## 2023-04-04 ENCOUNTER — Other Ambulatory Visit: Payer: Self-pay | Admitting: Family Medicine

## 2023-04-04 DIAGNOSIS — M1A9XX0 Chronic gout, unspecified, without tophus (tophi): Secondary | ICD-10-CM

## 2023-04-24 ENCOUNTER — Other Ambulatory Visit: Payer: Self-pay | Admitting: Cardiovascular Disease

## 2023-05-01 ENCOUNTER — Other Ambulatory Visit: Payer: Self-pay | Admitting: Family Medicine

## 2023-05-01 DIAGNOSIS — I480 Paroxysmal atrial fibrillation: Secondary | ICD-10-CM

## 2023-05-15 ENCOUNTER — Ambulatory Visit: Payer: 59

## 2023-05-15 DIAGNOSIS — I442 Atrioventricular block, complete: Secondary | ICD-10-CM

## 2023-05-15 LAB — CUP PACEART REMOTE DEVICE CHECK
Battery Remaining Longevity: 131 mo
Battery Voltage: 2.99 V
Brady Statistic RA Percent Paced: 0 %
Brady Statistic RV Percent Paced: 76.93 %
Date Time Interrogation Session: 20240924231347
Implantable Lead Connection Status: 753985
Implantable Lead Connection Status: 753985
Implantable Lead Implant Date: 20220831
Implantable Lead Implant Date: 20220831
Implantable Lead Location: 753859
Implantable Lead Location: 753860
Implantable Lead Model: 5076
Implantable Lead Model: 5076
Implantable Pulse Generator Implant Date: 20220831
Lead Channel Impedance Value: 342 Ohm
Lead Channel Impedance Value: 342 Ohm
Lead Channel Impedance Value: 418 Ohm
Lead Channel Impedance Value: 513 Ohm
Lead Channel Pacing Threshold Amplitude: 0.625 V
Lead Channel Pacing Threshold Pulse Width: 0.4 ms
Lead Channel Sensing Intrinsic Amplitude: 1.5 mV
Lead Channel Sensing Intrinsic Amplitude: 1.5 mV
Lead Channel Sensing Intrinsic Amplitude: 4 mV
Lead Channel Sensing Intrinsic Amplitude: 4 mV
Lead Channel Setting Pacing Amplitude: 2 V
Lead Channel Setting Pacing Amplitude: 2 V
Lead Channel Setting Pacing Pulse Width: 0.4 ms
Lead Channel Setting Sensing Sensitivity: 0.9 mV
Zone Setting Status: 755011
Zone Setting Status: 755011

## 2023-05-24 ENCOUNTER — Other Ambulatory Visit: Payer: Self-pay | Admitting: Family Medicine

## 2023-05-24 DIAGNOSIS — M62838 Other muscle spasm: Secondary | ICD-10-CM

## 2023-05-24 NOTE — Telephone Encounter (Signed)
Last apt 12/11/22

## 2023-05-26 ENCOUNTER — Other Ambulatory Visit: Payer: Self-pay | Admitting: Cardiovascular Disease

## 2023-05-31 NOTE — Progress Notes (Signed)
Remote pacemaker transmission.   

## 2023-06-23 ENCOUNTER — Other Ambulatory Visit: Payer: Self-pay | Admitting: Cardiovascular Disease

## 2023-06-25 ENCOUNTER — Other Ambulatory Visit: Payer: Self-pay | Admitting: Physician Assistant

## 2023-06-26 ENCOUNTER — Other Ambulatory Visit: Payer: Self-pay | Admitting: Cardiology

## 2023-07-25 ENCOUNTER — Other Ambulatory Visit: Payer: Self-pay | Admitting: Cardiology

## 2023-07-25 ENCOUNTER — Other Ambulatory Visit: Payer: Self-pay | Admitting: Cardiovascular Disease

## 2023-07-26 ENCOUNTER — Other Ambulatory Visit: Payer: Self-pay | Admitting: Family Medicine

## 2023-07-26 DIAGNOSIS — M62838 Other muscle spasm: Secondary | ICD-10-CM

## 2023-07-26 NOTE — Telephone Encounter (Signed)
Last apt 12/11/22

## 2023-08-13 ENCOUNTER — Telehealth: Payer: Self-pay | Admitting: Nurse Practitioner

## 2023-08-13 ENCOUNTER — Ambulatory Visit (INDEPENDENT_AMBULATORY_CARE_PROVIDER_SITE_OTHER): Payer: 59

## 2023-08-13 DIAGNOSIS — I442 Atrioventricular block, complete: Secondary | ICD-10-CM

## 2023-08-13 DIAGNOSIS — J22 Unspecified acute lower respiratory infection: Secondary | ICD-10-CM

## 2023-08-13 LAB — CUP PACEART REMOTE DEVICE CHECK
Battery Remaining Longevity: 129 mo
Battery Voltage: 2.99 V
Brady Statistic RA Percent Paced: 0 %
Brady Statistic RV Percent Paced: 68.3 %
Date Time Interrogation Session: 20241223223720
Implantable Lead Connection Status: 753985
Implantable Lead Connection Status: 753985
Implantable Lead Implant Date: 20220831
Implantable Lead Implant Date: 20220831
Implantable Lead Location: 753859
Implantable Lead Location: 753860
Implantable Lead Model: 5076
Implantable Lead Model: 5076
Implantable Pulse Generator Implant Date: 20220831
Lead Channel Impedance Value: 323 Ohm
Lead Channel Impedance Value: 361 Ohm
Lead Channel Impedance Value: 437 Ohm
Lead Channel Impedance Value: 475 Ohm
Lead Channel Pacing Threshold Amplitude: 0.625 V
Lead Channel Pacing Threshold Pulse Width: 0.4 ms
Lead Channel Sensing Intrinsic Amplitude: 2.375 mV
Lead Channel Sensing Intrinsic Amplitude: 2.375 mV
Lead Channel Sensing Intrinsic Amplitude: 4.375 mV
Lead Channel Sensing Intrinsic Amplitude: 4.375 mV
Lead Channel Setting Pacing Amplitude: 2 V
Lead Channel Setting Pacing Amplitude: 2 V
Lead Channel Setting Pacing Pulse Width: 0.4 ms
Lead Channel Setting Sensing Sensitivity: 0.9 mV
Zone Setting Status: 755011
Zone Setting Status: 755011

## 2023-08-13 MED ORDER — BENZONATATE 100 MG PO CAPS: 100.0000 mg | ORAL_CAPSULE | Freq: Three times a day (TID) | ORAL | 0 refills | Status: DC | PRN

## 2023-08-13 MED ORDER — DOXYCYCLINE HYCLATE 100 MG PO TABS: 100.0000 mg | ORAL_TABLET | Freq: Two times a day (BID) | ORAL | 0 refills | Status: AC

## 2023-08-13 NOTE — Progress Notes (Signed)
E-Visit for Cough  We are sorry that you are not feeling well.  Here is how we plan to help!  Based on your presentation I believe you most likely have A cough due to bacteria.  When patients have a fever and a productive cough with a change in color or increased sputum production, we are concerned about bacterial bronchitis.  If left untreated it can progress to pneumonia.  If your symptoms do not improve with your treatment plan it is important that you contact your provider.   I have prescribed Doxycycline 100 mg twice a day for 7 days     In addition you may use A prescription cough medication called Tessalon Perles 100mg . You may take 1-2 capsules every 8 hours as needed for your cough.    From your responses in the eVisit questionnaire you describe inflammation in the upper respiratory tract which is causing a significant cough.  This is commonly called Bronchitis and has four common causes:   Allergies Viral Infections Acid Reflux Bacterial Infection Allergies, viruses and acid reflux are treated by controlling symptoms or eliminating the cause. An example might be a cough caused by taking certain blood pressure medications. You stop the cough by changing the medication. Another example might be a cough caused by acid reflux. Controlling the reflux helps control the cough.  USE OF BRONCHODILATOR ("RESCUE") INHALERS: There is a risk from using your bronchodilator too frequently.  The risk is that over-reliance on a medication which only relaxes the muscles surrounding the breathing tubes can reduce the effectiveness of medications prescribed to reduce swelling and congestion of the tubes themselves.  Although you feel brief relief from the bronchodilator inhaler, your asthma may actually be worsening with the tubes becoming more swollen and filled with mucus.  This can delay other crucial treatments, such as oral steroid medications. If you need to use a bronchodilator inhaler daily, several  times per day, you should discuss this with your provider.  There are probably better treatments that could be used to keep your asthma under control.     HOME CARE Only take medications as instructed by your medical team. Complete the entire course of an antibiotic. Drink plenty of fluids and get plenty of rest. Avoid close contacts especially the very young and the elderly Cover your mouth if you cough or cough into your sleeve. Always remember to wash your hands A steam or ultrasonic humidifier can help congestion.   GET HELP RIGHT AWAY IF: You develop worsening fever. You become short of breath You cough up blood. Your symptoms persist after you have completed your treatment plan MAKE SURE YOU  Understand these instructions. Will watch your condition. Will get help right away if you are not doing well or get worse.    Thank you for choosing an e-visit.  Your e-visit answers were reviewed by a board certified advanced clinical practitioner to complete your personal care plan. Depending upon the condition, your plan could have included both over the counter or prescription medications.  Please review your pharmacy choice. Make sure the pharmacy is open so you can pick up prescription now. If there is a problem, you may contact your provider through [employer] and have the prescription routed to another pharmacy.  Your safety is important to [place]. If you have drug allergies check your prescription carefully.   For the next 24 hours you can use MyChart to ask questions about today's visit, request a non-urgent call back, or ask  for a work or school excuse. You will get an email in the next two days asking about your experience. I hope that your e-visit has been valuable and will speed your recovery.   Meds ordered this encounter  Medications   doxycycline (VIBRA-TABS) 100 MG tablet    Sig: Take 1 tablet (100 mg total) by mouth 2 (two) times daily for 7 days.    Dispense:  14  tablet    Refill:  0   benzonatate (TESSALON) 100 MG capsule    Sig: Take 1 capsule (100 mg total) by mouth 3 (three) times daily as needed.    Dispense:  30 capsule    Refill:  0    I spent approximately 5 minutes reviewing the patient's history, current symptoms and coordinating their care today.

## 2023-08-24 ENCOUNTER — Other Ambulatory Visit: Payer: Self-pay | Admitting: Cardiovascular Disease

## 2023-08-26 MED ORDER — POTASSIUM CHLORIDE CRYS ER 20 MEQ PO TBCR: 20.0000 meq | EXTENDED_RELEASE_TABLET | Freq: Every day | ORAL | 0 refills | Status: DC

## 2023-08-29 ENCOUNTER — Other Ambulatory Visit: Payer: Self-pay | Admitting: Podiatry

## 2023-09-02 ENCOUNTER — Other Ambulatory Visit: Payer: Self-pay | Admitting: Podiatry

## 2023-09-02 MED ORDER — GABAPENTIN 400 MG PO CAPS: 400.0000 mg | ORAL_CAPSULE | Freq: Three times a day (TID) | ORAL | 4 refills | Status: DC

## 2023-09-02 NOTE — Telephone Encounter (Signed)
 done

## 2023-09-23 NOTE — Progress Notes (Signed)
 Remote pacemaker transmission.

## 2023-09-26 ENCOUNTER — Other Ambulatory Visit: Payer: Self-pay | Admitting: Cardiology

## 2023-09-26 ENCOUNTER — Other Ambulatory Visit: Payer: Self-pay | Admitting: Family Medicine

## 2023-09-26 ENCOUNTER — Other Ambulatory Visit: Payer: Self-pay | Admitting: Cardiovascular Disease

## 2023-09-26 DIAGNOSIS — M62838 Other muscle spasm: Secondary | ICD-10-CM

## 2023-09-26 NOTE — Telephone Encounter (Signed)
Last apt 12/11/22

## 2023-09-28 ENCOUNTER — Other Ambulatory Visit: Payer: Self-pay | Admitting: Family Medicine

## 2023-09-28 DIAGNOSIS — M1A9XX0 Chronic gout, unspecified, without tophus (tophi): Secondary | ICD-10-CM

## 2023-10-15 ENCOUNTER — Encounter: Payer: Self-pay | Admitting: Internal Medicine

## 2023-10-28 ENCOUNTER — Other Ambulatory Visit: Payer: Self-pay | Admitting: Family Medicine

## 2023-10-28 ENCOUNTER — Other Ambulatory Visit: Payer: Self-pay | Admitting: Internal Medicine

## 2023-10-28 DIAGNOSIS — I480 Paroxysmal atrial fibrillation: Secondary | ICD-10-CM

## 2023-10-28 NOTE — Telephone Encounter (Signed)
 Would Dr Clifton James like for Potassium Chloride (KLOR-CON M) to be refilled. Pt is over due for appt. Last appt was 03/2022. Please advice Thank you

## 2023-10-29 MED ORDER — POTASSIUM CHLORIDE CRYS ER 20 MEQ PO TBCR: 20.0000 meq | EXTENDED_RELEASE_TABLET | Freq: Every day | ORAL | 0 refills | Status: AC

## 2023-11-12 ENCOUNTER — Ambulatory Visit: Payer: 59

## 2023-11-12 DIAGNOSIS — I442 Atrioventricular block, complete: Secondary | ICD-10-CM

## 2023-11-12 LAB — CUP PACEART REMOTE DEVICE CHECK
Battery Remaining Longevity: 125 mo
Battery Voltage: 2.99 V
Brady Statistic RA Percent Paced: 0 %
Brady Statistic RV Percent Paced: 73.06 %
Date Time Interrogation Session: 20250324210449
Implantable Lead Connection Status: 753985
Implantable Lead Connection Status: 753985
Implantable Lead Implant Date: 20220831
Implantable Lead Implant Date: 20220831
Implantable Lead Location: 753859
Implantable Lead Location: 753860
Implantable Lead Model: 5076
Implantable Lead Model: 5076
Implantable Pulse Generator Implant Date: 20220831
Lead Channel Impedance Value: 342 Ohm
Lead Channel Impedance Value: 361 Ohm
Lead Channel Impedance Value: 418 Ohm
Lead Channel Impedance Value: 551 Ohm
Lead Channel Pacing Threshold Amplitude: 0.75 V
Lead Channel Pacing Threshold Pulse Width: 0.4 ms
Lead Channel Sensing Intrinsic Amplitude: 2.125 mV
Lead Channel Sensing Intrinsic Amplitude: 2.125 mV
Lead Channel Sensing Intrinsic Amplitude: 3.75 mV
Lead Channel Sensing Intrinsic Amplitude: 3.75 mV
Lead Channel Setting Pacing Amplitude: 2 V
Lead Channel Setting Pacing Amplitude: 2 V
Lead Channel Setting Pacing Pulse Width: 0.4 ms
Lead Channel Setting Sensing Sensitivity: 0.9 mV
Zone Setting Status: 755011
Zone Setting Status: 755011

## 2023-11-17 ENCOUNTER — Other Ambulatory Visit: Payer: Self-pay | Admitting: Internal Medicine

## 2023-11-17 ENCOUNTER — Other Ambulatory Visit: Payer: Self-pay | Admitting: Family Medicine

## 2023-11-17 DIAGNOSIS — M62838 Other muscle spasm: Secondary | ICD-10-CM

## 2023-11-18 NOTE — Telephone Encounter (Signed)
 Last apt 12/11/22 next apt 12/17/23

## 2023-12-12 ENCOUNTER — Encounter: Payer: 59 | Admitting: Nurse Practitioner

## 2023-12-12 ENCOUNTER — Encounter: Payer: 59 | Admitting: Family Medicine

## 2023-12-17 ENCOUNTER — Ambulatory Visit: Admitting: Family Medicine

## 2023-12-17 VITALS — BP 122/80 | HR 75 | Ht 75.0 in | Wt >= 6400 oz

## 2023-12-17 DIAGNOSIS — G4733 Obstructive sleep apnea (adult) (pediatric): Secondary | ICD-10-CM

## 2023-12-17 DIAGNOSIS — Z950 Presence of cardiac pacemaker: Secondary | ICD-10-CM

## 2023-12-17 DIAGNOSIS — I442 Atrioventricular block, complete: Secondary | ICD-10-CM

## 2023-12-17 DIAGNOSIS — H903 Sensorineural hearing loss, bilateral: Secondary | ICD-10-CM

## 2023-12-17 DIAGNOSIS — I872 Venous insufficiency (chronic) (peripheral): Secondary | ICD-10-CM

## 2023-12-17 DIAGNOSIS — I83892 Varicose veins of left lower extremities with other complications: Secondary | ICD-10-CM | POA: Diagnosis not present

## 2023-12-17 DIAGNOSIS — E782 Mixed hyperlipidemia: Secondary | ICD-10-CM

## 2023-12-17 DIAGNOSIS — Z952 Presence of prosthetic heart valve: Secondary | ICD-10-CM | POA: Diagnosis not present

## 2023-12-17 DIAGNOSIS — J301 Allergic rhinitis due to pollen: Secondary | ICD-10-CM

## 2023-12-17 DIAGNOSIS — Z860101 Personal history of adenomatous and serrated colon polyps: Secondary | ICD-10-CM

## 2023-12-17 DIAGNOSIS — Z0000 Encounter for general adult medical examination without abnormal findings: Secondary | ICD-10-CM

## 2023-12-17 DIAGNOSIS — M1A0710 Idiopathic chronic gout, right ankle and foot, without tophus (tophi): Secondary | ICD-10-CM

## 2023-12-17 DIAGNOSIS — Z800 Family history of malignant neoplasm of digestive organs: Secondary | ICD-10-CM

## 2023-12-17 DIAGNOSIS — I10 Essential (primary) hypertension: Secondary | ICD-10-CM

## 2023-12-17 DIAGNOSIS — I480 Paroxysmal atrial fibrillation: Secondary | ICD-10-CM

## 2023-12-17 DIAGNOSIS — L219 Seborrheic dermatitis, unspecified: Secondary | ICD-10-CM

## 2023-12-17 DIAGNOSIS — M62838 Other muscle spasm: Secondary | ICD-10-CM

## 2023-12-17 DIAGNOSIS — Z8673 Personal history of transient ischemic attack (TIA), and cerebral infarction without residual deficits: Secondary | ICD-10-CM

## 2023-12-17 DIAGNOSIS — Z87891 Personal history of nicotine dependence: Secondary | ICD-10-CM

## 2023-12-17 LAB — LIPID PANEL

## 2023-12-17 MED ORDER — VALSARTAN-HYDROCHLOROTHIAZIDE 160-25 MG PO TABS: 1.0000 | ORAL_TABLET | Freq: Every day | ORAL | 3 refills | Status: AC

## 2023-12-17 MED ORDER — FUROSEMIDE 80 MG PO TABS: 40.0000 mg | ORAL_TABLET | Freq: Every day | ORAL | 1 refills | Status: AC

## 2023-12-17 MED ORDER — RIVAROXABAN 20 MG PO TABS: 20.0000 mg | ORAL_TABLET | Freq: Every day | ORAL | 3 refills | Status: DC

## 2023-12-17 MED ORDER — ATORVASTATIN CALCIUM 20 MG PO TABS: 20.0000 mg | ORAL_TABLET | Freq: Every day | ORAL | 3 refills | Status: AC

## 2023-12-17 MED ORDER — DILTIAZEM HCL ER COATED BEADS 120 MG PO CP24: ORAL_CAPSULE | ORAL | 3 refills | Status: DC

## 2023-12-17 NOTE — Progress Notes (Signed)
 Complete physical exam  Patient: David [name]   DOB: 1956-08-02   68 y.o. Male  MRN: [id number]  Subjective:    Chief Complaint  Patient presents with   Annual Exam    CPE.     David [name] is a 68 y.o. male who presents today for a complete physical exam.  He reports consuming a general diet. The patient does not participate in regular exercise at present. He generally feels well. He reports sleeping well.  He does have arthritis and will need knee replacements as soon as he can lose weight.  So far has been relatively unsuccessful with that.  He does have OSA and is on BiPAP which gives him good results.  He does have neuropathy and continues on gabapentin  which does help.  He stays on allopurinol  for his gout and rarely has an outbreak.  Continues on valsartan /Cardizem  and HCTZ and Toprol .  Does have a history of colonic polyp and also family history.  He knows to follow-up within the next year or 2 concerning that.  He does use Robaxin  for chronic neck pain and spasm and is fairly consistent with lower use of this.  Continues on Lipitor .  He is also taking Xarelto  because of PAF he has had a TAVR.  He is also has a cardiac pacemaker placed.  Most recent fall risk assessment:    12/17/2023    3:01 PM  Fall Risk   Falls in the past year? 1  Number falls in past yr: 1  Injury with Fall? 0  Risk for fall due to : History of fall(s)  Follow up Falls evaluation completed     Most recent depression screenings:    12/17/2023    3:01 PM 12/11/2022    2:05 PM  PHQ 2/9 Scores  PHQ - 2 Score 0 0    Vision:due for visit also due for dental     Immunization History  Administered Date(s) Administered   Influenza,inj,Quad PF,6+ Mos 07/03/2013, 06/22/2016, 04/26/2020, 05/05/2021   PFIZER(Purple Top)SARS-COV-2 Vaccination 11/08/2019, 11/29/2019, 09/15/2020   Pfizer Covid-19 Vaccine Bivalent Booster 62yrs & up 09/18/2021   Pfizer(Comirnaty)Fall Seasonal Vaccine 12 years and older  12/11/2022   Pneumococcal Conjugate-13 02/13/2018   Pneumococcal Polysaccharide-23 06/22/2016   Td 07/06/2011   Tdap 09/18/2021   Zoster Recombinant(Shingrix ) 02/24/2019, 06/29/2019    Health Maintenance  Topic Date Due   Lung Cancer Screening  05/19/2022   COVID-19 Vaccine (6 - 2024-25 season) 04/21/2023   Pneumonia Vaccine 39+ Years old (3 of 3 - PCV20 or PCV21) 02/16/2026 (Originally 02/14/2023)   INFLUENZA VACCINE  03/20/2024   Colonoscopy  11/26/2030   DTaP/Tdap/Td (3 - Td or Tdap) 09/19/2031   Hepatitis C Screening  Completed   Zoster Vaccines- Shingrix   Completed   HPV VACCINES  Aged Out   Meningococcal B Vaccine  Aged Out   Fecal DNA (Cologuard)  Discontinued    Patient Care Team: Watson Hacking, MD as PCP - General (Family Medicine) Hazle Lites, MD as PCP - Cardiology (Cardiology)   Outpatient Medications Prior to Visit  Medication Sig Note   acetaminophen  (TYLENOL ) 325 MG tablet Take 650 mg by mouth every 6 (six) hours as needed.    allopurinol  (ZYLOPRIM ) 300 MG tablet TAKE 1 TABLET(300 MG) BY MOUTH DAILY    ALPRAZolam  (XANAX ) 1 MG tablet TAKE 1 TABLET(1 MG) BY MOUTH AT BEDTIME AS NEEDED FOR SLEEP    amoxicillin  (AMOXIL ) 500 MG tablet Take 4 tablets by  mouth 60 minutes before any dental appointments    gabapentin  (NEURONTIN ) 400 MG capsule Take 1 capsule (400 mg total) by mouth at bedtime.    methocarbamol  (ROBAXIN ) 500 MG tablet TAKE 1 TABLET(500 MG) BY MOUTH FOUR TIMES DAILY AS NEEDED FOR MUSCLE SPASMS    metoprolol  succinate (TOPROL -XL) 50 MG 24 hr tablet Take with or immediately following a meal.    potassium chloride  SA (KLOR-CON  M) 20 MEQ tablet Take 1 tablet (20 mEq total) by mouth daily.    [DISCONTINUED] furosemide  (LASIX ) 80 MG tablet TAKE 1 TABLET(80 MG) BY MOUTH DAILY 12/17/2023: Needs refill   allopurinol  (ZYLOPRIM ) 100 MG tablet Take 1 tablet (100 mg total) by mouth daily.    benzonatate  (TESSALON ) 100 MG capsule Take 1 capsule (100 mg total) by  mouth 3 (three) times daily as needed. (Patient not taking: Reported on 12/17/2023)    colchicine  0.6 MG tablet TAKE 1 TABLET(0.6 MG) BY MOUTH DAILY (Patient not taking: Reported on 12/17/2023)    gabapentin  (NEURONTIN ) 400 MG capsule Take 1 capsule (400 mg total) by mouth 3 (three) times daily.    hydrocortisone  2.5 % cream APPLY TO EXTERNAL EARS AS NEEDED FOR ITCHING AS DIRECTED    [DISCONTINUED] atorvastatin  (LIPITOR ) 20 MG tablet Take 1 tablet (20 mg total) by mouth daily.    [DISCONTINUED] diltiazem  (CARDIZEM  CD) 120 MG 24 hr capsule TAKE 1 CAPSULE(120 MG) BY MOUTH DAILY    [DISCONTINUED] rivaroxaban  (XARELTO ) 20 MG TABS tablet TAKE 1 TABLET(20 MG) BY MOUTH DAILY WITH SUPPER.    [DISCONTINUED] valsartan -hydrochlorothiazide  (DIOVAN -HCT) 160-25 MG tablet Take 1 tablet by mouth daily.    No facility-administered medications prior to visit.    Review of Systems  All other systems reviewed and are negative.   Family and social history as well as health maintenance and immunizations was reviewed.     Objective:    BP 122/80   Pulse 75   Ht 6\' 3"  (1.905 m)   Wt (!) 414 lb 6.4 oz (188 kg)   SpO2 98%   BMI 51.80 kg/m    Physical Exam  Alert and in no distress. Tympanic membranes and canals are normal. Pharyngeal area is normal. Neck is supple without adenopathy or thyromegaly. Cardiac exam shows a regular sinus rhythm without murmurs or gallops. Lungs are clear to auscultation.      Assessment & Plan:    Discussed health benefits of physical activity, and encouraged him to engage in regular exercise appropriate for his age and condition.  Routine general medical examination at a health care facility  Venous insufficiency - Plan: furosemide  (LASIX ) 80 MG tablet  Symptomatic varicose veins of left lower extremity  Seborrheic dermatitis  S/P TAVR (transcatheter aortic valve replacement)  S/P placement of cardiac pacemaker - Plan: CBC with Differential/Platelet, Comprehensive  metabolic panel with GFR, Lipid panel  PAF (paroxysmal atrial fibrillation) (HCC) - Plan: diltiazem  (CARDIZEM  CD) 120 MG 24 hr capsule, rivaroxaban  (XARELTO ) 20 MG TABS tablet  OSA treated with BiPAP  Muscle spasms of neck  Morbid obesity due to excess calories (HCC) - Plan: CBC with Differential/Platelet, Comprehensive metabolic panel with GFR, Lipid panel  History of CVA (cerebrovascular accident)  Family history of colon cancer in father  Former smoker  Heart block AV complete (HCC)  History of adenomatous polyp of colon  Essential hypertension - Plan: diltiazem  (CARDIZEM  CD) 120 MG 24 hr capsule, valsartan -hydrochlorothiazide  (DIOVAN -HCT) 160-25 MG tablet  Mixed hyperlipidemia - Plan: atorvastatin  (LIPITOR ) 20 MG tablet  Idiopathic chronic  gout of right foot without tophus  Seasonal allergic rhinitis due to pollen   Return in about 1 year (around 12/16/2024).      Ron Cobbs, MD

## 2023-12-18 ENCOUNTER — Encounter: Payer: Self-pay | Admitting: Family Medicine

## 2023-12-18 LAB — CBC WITH DIFFERENTIAL/PLATELET
Basophils Absolute: 0.1 10*3/uL (ref 0.0–0.2)
Basos: 1 %
EOS (ABSOLUTE): 0.2 10*3/uL (ref 0.0–0.4)
Eos: 2 %
Hematocrit: 45.7 % (ref 37.5–51.0)
Hemoglobin: 15.4 g/dL (ref 13.0–17.7)
Immature Grans (Abs): 0 10*3/uL (ref 0.0–0.1)
Immature Granulocytes: 1 %
Lymphocytes Absolute: 2.4 10*3/uL (ref 0.7–3.1)
Lymphs: 29 %
MCH: 32.7 pg (ref 26.6–33.0)
MCHC: 33.7 g/dL (ref 31.5–35.7)
MCV: 97 fL (ref 79–97)
Monocytes Absolute: 0.6 10*3/uL (ref 0.1–0.9)
Monocytes: 8 %
Neutrophils Absolute: 5 10*3/uL (ref 1.4–7.0)
Neutrophils: 59 %
Platelets: 222 10*3/uL (ref 150–450)
RBC: 4.71 x10E6/uL (ref 4.14–5.80)
RDW: 14.1 % (ref 11.6–15.4)
WBC: 8.3 10*3/uL (ref 3.4–10.8)

## 2023-12-18 LAB — COMPREHENSIVE METABOLIC PANEL WITH GFR
ALT: 20 IU/L (ref 0–44)
AST: 19 IU/L (ref 0–40)
Albumin: 4.4 g/dL (ref 3.9–4.9)
Alkaline Phosphatase: 78 IU/L (ref 44–121)
BUN/Creatinine Ratio: 21 (ref 10–24)
BUN: 26 mg/dL (ref 8–27)
Bilirubin Total: 0.6 mg/dL (ref 0.0–1.2)
CO2: 19 mmol/L — ABNORMAL LOW (ref 20–29)
Calcium: 9.5 mg/dL (ref 8.6–10.2)
Chloride: 100 mmol/L (ref 96–106)
Creatinine, Ser: 1.22 mg/dL (ref 0.76–1.27)
Globulin, Total: 2.7 g/dL (ref 1.5–4.5)
Glucose: 102 mg/dL — ABNORMAL HIGH (ref 70–99)
Potassium: 4 mmol/L (ref 3.5–5.2)
Sodium: 139 mmol/L (ref 134–144)
Total Protein: 7.1 g/dL (ref 6.0–8.5)
eGFR: 65 mL/min/{1.73_m2} (ref >59)

## 2023-12-18 LAB — LIPID PANEL
Cholesterol, Total: 137 mg/dL (ref 100–199)
HDL: 29 mg/dL — ABNORMAL LOW (ref >39)
LDL CALC COMMENT:: 4.7 ratio (ref 0.0–5.0)
LDL Chol Calc (NIH): 79 mg/dL (ref 0–99)
Triglycerides: 164 mg/dL — ABNORMAL HIGH (ref 0–149)
VLDL Cholesterol Cal: 29 mg/dL (ref 5–40)

## 2023-12-20 LAB — SPECIMEN STATUS REPORT

## 2023-12-20 LAB — HGB A1C W/O EAG: Hgb A1c MFr Bld: 5.9 % — ABNORMAL HIGH (ref 4.8–5.6)

## 2023-12-25 ENCOUNTER — Other Ambulatory Visit: Payer: Self-pay | Admitting: Family Medicine

## 2023-12-25 DIAGNOSIS — M1A9XX0 Chronic gout, unspecified, without tophus (tophi): Secondary | ICD-10-CM

## 2023-12-26 NOTE — Progress Notes (Signed)
 Remote pacemaker transmission.

## 2024-01-01 ENCOUNTER — Other Ambulatory Visit: Payer: Self-pay | Admitting: Family Medicine

## 2024-01-01 DIAGNOSIS — M62838 Other muscle spasm: Secondary | ICD-10-CM

## 2024-02-06 ENCOUNTER — Other Ambulatory Visit: Payer: Self-pay | Admitting: Family Medicine

## 2024-02-06 DIAGNOSIS — I480 Paroxysmal atrial fibrillation: Secondary | ICD-10-CM

## 2024-02-11 ENCOUNTER — Ambulatory Visit (INDEPENDENT_AMBULATORY_CARE_PROVIDER_SITE_OTHER): Payer: 59

## 2024-02-11 DIAGNOSIS — I442 Atrioventricular block, complete: Secondary | ICD-10-CM

## 2024-02-12 ENCOUNTER — Other Ambulatory Visit: Payer: Self-pay | Admitting: Family Medicine

## 2024-02-12 DIAGNOSIS — I480 Paroxysmal atrial fibrillation: Secondary | ICD-10-CM

## 2024-02-12 DIAGNOSIS — I10 Essential (primary) hypertension: Secondary | ICD-10-CM

## 2024-02-12 LAB — CUP PACEART REMOTE DEVICE CHECK
Battery Remaining Longevity: 120 mo
Battery Voltage: 2.99 V
Brady Statistic RA Percent Paced: 0 %
Brady Statistic RV Percent Paced: 84.33 %
Date Time Interrogation Session: 20250624085952
Implantable Lead Connection Status: 753985
Implantable Lead Connection Status: 753985
Implantable Lead Implant Date: 20220831
Implantable Lead Implant Date: 20220831
Implantable Lead Location: 753859
Implantable Lead Location: 753860
Implantable Lead Model: 5076
Implantable Lead Model: 5076
Implantable Pulse Generator Implant Date: 20220831
Lead Channel Impedance Value: 323 Ohm
Lead Channel Impedance Value: 323 Ohm
Lead Channel Impedance Value: 399 Ohm
Lead Channel Impedance Value: 456 Ohm
Lead Channel Pacing Threshold Amplitude: 0.625 V
Lead Channel Pacing Threshold Pulse Width: 0.4 ms
Lead Channel Sensing Intrinsic Amplitude: 1.5 mV
Lead Channel Sensing Intrinsic Amplitude: 1.5 mV
Lead Channel Sensing Intrinsic Amplitude: 3.5 mV
Lead Channel Sensing Intrinsic Amplitude: 3.5 mV
Lead Channel Setting Pacing Amplitude: 2 V
Lead Channel Setting Pacing Amplitude: 2 V
Lead Channel Setting Pacing Pulse Width: 0.4 ms
Lead Channel Setting Sensing Sensitivity: 0.9 mV
Zone Setting Status: 755011
Zone Setting Status: 755011

## 2024-02-16 ENCOUNTER — Ambulatory Visit: Payer: Self-pay | Admitting: Cardiology

## 2024-02-28 ENCOUNTER — Other Ambulatory Visit: Payer: Self-pay | Admitting: Family Medicine

## 2024-02-28 DIAGNOSIS — E782 Mixed hyperlipidemia: Secondary | ICD-10-CM

## 2024-03-03 NOTE — Telephone Encounter (Signed)
   This was prescribed 12/17/23 #90 with 3 refills to [facility]. Patient should not be out.    Copied from CRM [id number]. Topic: Clinical - Prescription Issue >> Mar 03, 2024  2:45 PM Graeme ORN wrote: Reason for CRM: Patient called. States received a message from pharmacy that medication was denied. Let him know denied - refill not appropriate. 3 refills on Rx. Patient understood will try to reach back out to pharmacy. Confirmed pharmacy correct. Thank You >> Mar 03, 2024  2:47 PM Graeme W wrote: Atorvastatin

## 2024-03-26 ENCOUNTER — Other Ambulatory Visit: Payer: Self-pay | Admitting: Family Medicine

## 2024-03-26 DIAGNOSIS — M1A9XX0 Chronic gout, unspecified, without tophus (tophi): Secondary | ICD-10-CM

## 2024-05-12 ENCOUNTER — Ambulatory Visit (INDEPENDENT_AMBULATORY_CARE_PROVIDER_SITE_OTHER): Payer: 59

## 2024-05-12 DIAGNOSIS — I442 Atrioventricular block, complete: Secondary | ICD-10-CM

## 2024-05-12 LAB — CUP PACEART REMOTE DEVICE CHECK
Battery Remaining Longevity: 115 mo
Battery Voltage: 2.98 V
Brady Statistic RA Percent Paced: 0 %
Brady Statistic RV Percent Paced: 86.63 %
Date Time Interrogation Session: 20250922214228
Implantable Lead Connection Status: 753985
Implantable Lead Connection Status: 753985
Implantable Lead Implant Date: 20220831
Implantable Lead Implant Date: 20220831
Implantable Lead Location: 753859
Implantable Lead Location: 753860
Implantable Lead Model: 5076
Implantable Lead Model: 5076
Implantable Pulse Generator Implant Date: 20220831
Lead Channel Impedance Value: 342 Ohm
Lead Channel Impedance Value: 342 Ohm
Lead Channel Impedance Value: 399 Ohm
Lead Channel Impedance Value: 494 Ohm
Lead Channel Pacing Threshold Amplitude: 0.625 V
Lead Channel Pacing Threshold Pulse Width: 0.4 ms
Lead Channel Sensing Intrinsic Amplitude: 1.5 mV
Lead Channel Sensing Intrinsic Amplitude: 1.5 mV
Lead Channel Sensing Intrinsic Amplitude: 4.25 mV
Lead Channel Sensing Intrinsic Amplitude: 4.25 mV
Lead Channel Setting Pacing Amplitude: 2 V
Lead Channel Setting Pacing Amplitude: 2 V
Lead Channel Setting Pacing Pulse Width: 0.4 ms
Lead Channel Setting Sensing Sensitivity: 0.9 mV
Zone Setting Status: 755011
Zone Setting Status: 755011

## 2024-05-13 ENCOUNTER — Ambulatory Visit: Payer: Self-pay | Admitting: Cardiology

## 2024-05-13 NOTE — Progress Notes (Signed)
 Remote PPM Transmission

## 2024-06-20 ENCOUNTER — Other Ambulatory Visit: Payer: Self-pay | Admitting: Family Medicine

## 2024-06-20 DIAGNOSIS — M1A9XX0 Chronic gout, unspecified, without tophus (tophi): Secondary | ICD-10-CM

## 2024-07-29 ENCOUNTER — Telehealth: Admitting: Physician Assistant

## 2024-07-29 DIAGNOSIS — J069 Acute upper respiratory infection, unspecified: Secondary | ICD-10-CM

## 2024-07-29 MED ORDER — BENZONATATE 100 MG PO CAPS: 100.0000 mg | ORAL_CAPSULE | Freq: Three times a day (TID) | ORAL | 0 refills | Status: AC | PRN

## 2024-07-29 MED ORDER — FLUTICASONE PROPIONATE 50 MCG/ACT NA SUSP: 2.0000 | Freq: Every day | NASAL | 0 refills | Status: AC

## 2024-07-29 MED ORDER — ALBUTEROL SULFATE HFA 108 (90 BASE) MCG/ACT IN AERS: 1.0000 | INHALATION_SPRAY | Freq: Four times a day (QID) | RESPIRATORY_TRACT | 0 refills | Status: AC | PRN

## 2024-07-29 NOTE — Progress Notes (Signed)

## 2024-08-03 ENCOUNTER — Other Ambulatory Visit: Payer: Self-pay | Admitting: Family Medicine

## 2024-08-03 DIAGNOSIS — M62838 Other muscle spasm: Secondary | ICD-10-CM

## 2024-08-11 ENCOUNTER — Ambulatory Visit: Payer: 59

## 2024-08-11 DIAGNOSIS — I442 Atrioventricular block, complete: Secondary | ICD-10-CM | POA: Diagnosis not present

## 2024-08-12 LAB — CUP PACEART REMOTE DEVICE CHECK
Battery Remaining Longevity: 112 mo
Battery Voltage: 2.98 V
Brady Statistic RA Percent Paced: 0 %
Brady Statistic RV Percent Paced: 77.29 %
Date Time Interrogation Session: 20251222194921
Implantable Lead Connection Status: 753985
Implantable Lead Connection Status: 753985
Implantable Lead Implant Date: 20220831
Implantable Lead Implant Date: 20220831
Implantable Lead Location: 753859
Implantable Lead Location: 753860
Implantable Lead Model: 5076
Implantable Lead Model: 5076
Implantable Pulse Generator Implant Date: 20220831
Lead Channel Impedance Value: 342 Ohm
Lead Channel Impedance Value: 361 Ohm
Lead Channel Impedance Value: 399 Ohm
Lead Channel Impedance Value: 456 Ohm
Lead Channel Pacing Threshold Amplitude: 0.625 V
Lead Channel Pacing Threshold Pulse Width: 0.4 ms
Lead Channel Sensing Intrinsic Amplitude: 2.625 mV
Lead Channel Sensing Intrinsic Amplitude: 2.625 mV
Lead Channel Sensing Intrinsic Amplitude: 4 mV
Lead Channel Sensing Intrinsic Amplitude: 4 mV
Lead Channel Setting Pacing Amplitude: 2 V
Lead Channel Setting Pacing Amplitude: 2 V
Lead Channel Setting Pacing Pulse Width: 0.4 ms
Lead Channel Setting Sensing Sensitivity: 0.9 mV
Zone Setting Status: 755011
Zone Setting Status: 755011

## 2024-08-12 NOTE — Progress Notes (Signed)
 Remote PPM Transmission

## 2024-08-14 ENCOUNTER — Ambulatory Visit: Payer: Self-pay | Admitting: Cardiology
